# Patient Record
Sex: Male | Born: 1955
Health system: Southern US, Community
[De-identification: ages and names within clinical notes are randomized; demographics above are authoritative.]

## PROBLEM LIST (undated history)

## (undated) DIAGNOSIS — R7301 Impaired fasting glucose: Secondary | ICD-10-CM

## (undated) DIAGNOSIS — I2510 Atherosclerotic heart disease of native coronary artery without angina pectoris: Secondary | ICD-10-CM

## (undated) DIAGNOSIS — M545 Low back pain, unspecified: Secondary | ICD-10-CM

## (undated) DIAGNOSIS — E119 Type 2 diabetes mellitus without complications: Secondary | ICD-10-CM

## (undated) DIAGNOSIS — I82409 Acute embolism and thrombosis of unspecified deep veins of unspecified lower extremity: Secondary | ICD-10-CM

## (undated) DIAGNOSIS — G629 Polyneuropathy, unspecified: Secondary | ICD-10-CM

## (undated) DIAGNOSIS — I739 Peripheral vascular disease, unspecified: Secondary | ICD-10-CM

## (undated) DIAGNOSIS — R480 Dyslexia and alexia: Secondary | ICD-10-CM

## (undated) DIAGNOSIS — K649 Unspecified hemorrhoids: Secondary | ICD-10-CM

## (undated) DIAGNOSIS — M87052 Idiopathic aseptic necrosis of left femur: Secondary | ICD-10-CM

## (undated) DIAGNOSIS — S069X9A Unspecified intracranial injury with loss of consciousness of unspecified duration, initial encounter: Secondary | ICD-10-CM

## (undated) DIAGNOSIS — I219 Acute myocardial infarction, unspecified: Secondary | ICD-10-CM

## (undated) DIAGNOSIS — J189 Pneumonia, unspecified organism: Secondary | ICD-10-CM

## (undated) DIAGNOSIS — T8859XA Other complications of anesthesia, initial encounter: Secondary | ICD-10-CM

## (undated) DIAGNOSIS — F332 Major depressive disorder, recurrent severe without psychotic features: Secondary | ICD-10-CM

## (undated) DIAGNOSIS — I701 Atherosclerosis of renal artery: Secondary | ICD-10-CM

## (undated) DIAGNOSIS — IMO0001 Reserved for inherently not codable concepts without codable children: Secondary | ICD-10-CM

## (undated) DIAGNOSIS — I639 Cerebral infarction, unspecified: Secondary | ICD-10-CM

## (undated) DIAGNOSIS — I509 Heart failure, unspecified: Secondary | ICD-10-CM

## (undated) DIAGNOSIS — E7800 Pure hypercholesterolemia, unspecified: Secondary | ICD-10-CM

## (undated) DIAGNOSIS — I10 Essential (primary) hypertension: Secondary | ICD-10-CM

## (undated) DIAGNOSIS — Z8719 Personal history of other diseases of the digestive system: Secondary | ICD-10-CM

## (undated) DIAGNOSIS — T4145XA Adverse effect of unspecified anesthetic, initial encounter: Secondary | ICD-10-CM

## (undated) DIAGNOSIS — F1011 Alcohol abuse, in remission: Secondary | ICD-10-CM

## (undated) DIAGNOSIS — M1612 Unilateral primary osteoarthritis, left hip: Secondary | ICD-10-CM

## (undated) DIAGNOSIS — Z720 Tobacco use: Secondary | ICD-10-CM

## (undated) DIAGNOSIS — G8929 Other chronic pain: Secondary | ICD-10-CM

## (undated) DIAGNOSIS — K5730 Diverticulosis of large intestine without perforation or abscess without bleeding: Secondary | ICD-10-CM

## (undated) HISTORY — DX: Diverticulosis of large intestine without perforation or abscess without bleeding: K57.30

## (undated) HISTORY — DX: Polyneuropathy, unspecified: G62.9

## (undated) HISTORY — DX: Type 2 diabetes mellitus without complications: E11.9

## (undated) HISTORY — DX: Acute myocardial infarction, unspecified: I21.9

## (undated) HISTORY — DX: Tobacco use: Z72.0

## (undated) HISTORY — DX: Idiopathic aseptic necrosis of left femur: M87.052

## (undated) HISTORY — PX: CARDIAC CATHETERIZATION: SHX172

## (undated) HISTORY — DX: Alcohol abuse, in remission: F10.11

## (undated) HISTORY — DX: Low back pain, unspecified: M54.50

## (undated) HISTORY — DX: Low back pain: M54.5

## (undated) HISTORY — DX: Atherosclerosis of renal artery: I70.1

## (undated) HISTORY — DX: Heart failure, unspecified: I50.9

## (undated) HISTORY — DX: Impaired fasting glucose: R73.01

## (undated) HISTORY — DX: Other chronic pain: G89.29

## (undated) HISTORY — PX: PERIPHERAL ARTERIAL STENT GRAFT: SHX2220

## (undated) HISTORY — DX: Major depressive disorder, recurrent severe without psychotic features: F33.2

## (undated) HISTORY — DX: Acute embolism and thrombosis of unspecified deep veins of unspecified lower extremity: I82.409

## (undated) HISTORY — PX: COLONOSCOPY W/ POLYPECTOMY: SHX1380

## (undated) HISTORY — DX: Unilateral primary osteoarthritis, left hip: M16.12

---

## 2007-12-22 DIAGNOSIS — I639 Cerebral infarction, unspecified: Secondary | ICD-10-CM

## 2007-12-22 HISTORY — DX: Cerebral infarction, unspecified: I63.9

## 2007-12-22 HISTORY — PX: CORONARY ARTERY BYPASS GRAFT: SHX141

## 2008-07-19 ENCOUNTER — Ambulatory Visit: Payer: Self-pay | Admitting: Surgery

## 2008-07-19 ENCOUNTER — Encounter: Payer: Self-pay | Admitting: Surgery

## 2008-07-19 ENCOUNTER — Inpatient Hospital Stay (HOSPITAL_COMMUNITY): Admission: EM | Admit: 2008-07-19 | Discharge: 2008-07-26 | Payer: Self-pay | Admitting: Emergency Medicine

## 2008-08-10 ENCOUNTER — Ambulatory Visit (HOSPITAL_COMMUNITY): Admission: RE | Admit: 2008-08-10 | Discharge: 2008-08-10 | Payer: Self-pay | Admitting: Cardiovascular Disease

## 2008-08-14 ENCOUNTER — Encounter: Admission: RE | Admit: 2008-08-14 | Discharge: 2008-08-14 | Payer: Self-pay | Admitting: Surgery

## 2008-08-14 ENCOUNTER — Ambulatory Visit: Payer: Self-pay | Admitting: Surgery

## 2008-08-22 ENCOUNTER — Emergency Department: Payer: Self-pay | Admitting: Emergency Medicine

## 2008-12-31 ENCOUNTER — Ambulatory Visit: Payer: Self-pay | Admitting: Gastroenterology

## 2011-02-17 HISTORY — PX: OTHER SURGICAL HISTORY: SHX169

## 2011-02-19 ENCOUNTER — Ambulatory Visit: Payer: Self-pay | Admitting: Vascular Surgery

## 2011-04-20 ENCOUNTER — Ambulatory Visit: Payer: Self-pay | Admitting: Vascular Surgery
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
## 2011-05-05 NOTE — Assessment & Plan Note (Signed)
OFFICE VISIT   Lucas Weiss, Lucas Weiss  DOB:  Feb 15, 1956                                        August 14, 2008  CHART #:  [id number]   The patient returns today for followup status post coronary artery  bypass graft surgery x5 on July 20, 2008.  His postoperative course was  complicated by a left eye lower visual field cut, which he noticed after  awakening from the surgery.  This persisted without change.  He did have  a CT scan of the head, which was negative for any sign of stroke.  Since  discharge, he has followed up with his ophthalmologist, Dr. Oren Bracket.  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  It was unclear whether  this was due to occlusion of the vessel or embolic disease, but no other  embolic disease was seen.  He underwent a repeat CT scan of the head  last week, which showed no acute abnormality and no sign of stroke.  He  says that his visual field deficit is unchanged.  He remains somewhat  sore overall.  He does have chronic arthritis and has been on disability  on pain medication at home for that.  He denies any chest pain or  shortness of breath.   On physical examination, his blood pressure is 110/64 and his pulse is  63 and regular.  Respiratory rate is 18, unlabored.  Oxygen saturation  on room air is 100%.  He looks well.  Cardiac exam shows regular rate  and rhythm with normal heart sounds.  His lung exam is clear.  Chest  incision is healing well, and the sternum is stable.  His leg incision  is healing well, and there is no peripheral edema.   Followup chest x-ray today shows small left pleural effusion and minimal  residual atelectasis.   His medications are Crestor 20 mg nightly, lisinopril 10 mg daily,  aspirin 325 mg daily, and hydrocodone 7.5/500 p.r.n. for pain.   IMPRESSION:  Overall, the patient is making slow but satisfactory  recovery following his  heart surgery.  He has suffered a left visual  field deficit, which I suspect is probably due to perioperative embolic  phenomena.  He does have severe diffuse vascular disease.  He said that  Dr. Alanda Amass has scheduled an appointment for him to see a neurologist.  The ophthalmologist felt that his vision may improve some when the edema  resolves, but he will likely have a persistent left eye deficit.  I told  him he could return to driving a car when he feels comfortable with  that.  I asked him not to lift anything heavier than 10 pounds for a  total of 3 months from date of surgery.  He will continue to follow up  with his primary physician and Dr. Alanda Amass and will contact me if he  develops any problems with his incisions.   Evelene Croon, M.D.  Electronically Signed   BB/MEDQ  D:  08/14/2008  T:  08/15/2008  Job:  [id number]   cc:   Gerlene Burdock A. Alanda Amass, M.D.

## 2011-05-05 NOTE — Op Note (Signed)
Lucas Weiss, Lucas Weiss                 ACCOUNT NO.:  [id number]   MEDICAL RECORD NO.:  [id number]          PATIENT TYPE:  INP   LOCATION:  2306                         FACILITY:  [facility]   PHYSICIAN:  Evelene Croon, M.D.     DATE OF BIRTH:  Nov 30, 1956   DATE OF PROCEDURE:  07/20/2008  DATE OF DISCHARGE:                               OPERATIVE REPORT   PREOPERATIVE DIAGNOSIS:  High-grade left main and severe three-vessel  coronary disease with unstable angina.   POSTOPERATIVE DIAGNOSIS:  High-grade left main and severe three-vessel  coronary disease with unstable angina.   OPERATIVE PROCEDURE:  Median sternotomy, extracorporeal circulation,  coronary artery bypass graft surgery x5 using a left internal mammary  artery graft to the left anterior descending coronary artery, with a  saphenous vein graft to diagonal branch of the left anterior descending  artery, a sequential saphenous vein graft to the second and third obtuse  marginal branches of the left circumflex coronary artery, and a  saphenous vein graft to the posterior descending branch of the right  coronary artery.  Endoscopic vein harvesting from the right leg.   ATTENDING SURGEON:  Evelene Croon, MD   ASSISTANT:  Doree Fudge, [facility]   ANESTHESIA:  General endotracheal.   CLINICAL HISTORY:  This patient is a 55 year old gentleman with history  of hyperlipidemia and smoking who was admitted with unstable anginal  symptoms.  Cardiac catheterization showed about 85% left main stenosis  as well as severe diffuse three-vessel coronary artery disease with well-  preserved left ventricular function.  After review of the angiogram and  examination, the patient was felt that coronary bypass graft surgery was  the best treatment to prevent further ischemia and infarction.  I  discussed the operative procedure with the patient and his family  including alternatives, benefits, and risks including but not limited to  bleeding, blood  transfusion, infection, stroke, myocardial infarction,  graft failure, and death.  He understood and agreed to proceed.   OPERATIVE PROCEDURE:  The patient was taken to the operating room and  placed on table in supine position.  After induction of general  endotracheal anesthesia, a Foley catheter was placed in the bladder  using sterile technique.  Then the chest, abdomen, and both lower  extremities were prepped and draped in usual sterile manner.  The chest  entered through a median sternotomy incision.  The pericardium opened in  the midline.  Examination of the heart showed good ventricular  contractility.  The ascending aorta had no palpable plaques in it.   Then the left internal mammary artery was harvested from the chest wall  as a pedicle graft.  This was a medium caliber vessel with excellent  blood flow through.  At the same time, a segment of greater saphenous  vein was harvested from the right leg using endoscopic vein harvest  technique.  This vein was a medium size and good quality.   Then the patient was heparinized and when an adequate activated clotting  time was achieved, the distal ascending aorta was cannulated using a 20-  [place] aortic  cannula for arterial inflow.  Venous outflow was achieved  using a 2-stage venous cannula through the right atrial appendage.  An  antegrade cardioplegia and vent cannula was inserted into the aortic  root.   The patient was placed on cardiopulmonary bypass and distal coronaries  were identified.  The LAD was a large graftable vessel that was heavily  diseased proximally and distally.  The midportion of the vessel had some  segmental areas of plaque, but overall was a large graftable vessel.  The diagonal branch was a medium-sized graftable vessel.  The second and  third marginal branches were both intramyocardial.  There were located  just as they entered the muscle.  The second marginal was a large vessel  and the third  marginal was a medium-sized vessel.  The right coronary  artery was diffusely diseased and this extended down to the takeoff of  the posterior descending branch.  The posterior descending and  posterolateral branches were all small vessels.  The posterior  descending was of borderline size for grafting but given the severe  degree of disease in the main body of the right coronary artery, I felt  it would be necessary to bypass into the posterior descending branch.   Then the aorta was crossclamped and 500 mL of cold blood antegrade  cardioplegia was administered in the aortic root with quick arrest of  the heart.  Systemic hypothermia to 20 degrees centigrade and topical  hypothermia with iced saline was used.  A temperature probe placed in  the septum and insulating pad in the pericardium.   The first distal anastomosis was performed to the posterior descending  coronary artery.  The internal diameter of this vessel was about 1.5 mm.  The conduit used was a segment of greater saphenous vein and the  anastomosis was performed in an end-to-side manner using continuous 7-0  Prolene suture.  Flow was noted through the graft and was excellent.   Then the second distal anastomosis was performed to the second marginal  branch.  The internal diameter was about 2 mm.  Conduit used was a  second segment of greater saphenous vein and the anastomosis was  performed in a sequential side-to-side manner using continuous 7-0  Prolene suture.  Flow was noted through the graft and was excellent.   The third distal anastomosis was performed to the third marginal branch.  The internal diameter was about 1.75 mm.  The conduit used was the same  segment of greater saphenous vein and the anastomosis was performed in a  sequential end-to-side manner using continuous 7-0 Prolene suture.  Flow  was noted through the graft and was excellent.  Then another dose of  cardioplegia was given down the vein grafts and  in the aortic root.   The fourth distal anastomosis was performed to the diagonal branch.  The  internal diameter was about 1.5 mm.  The conduit used was a third  segment of greater saphenous vein and the anastomosis was performed in  an end-to-side manner using continuous 7-0 Prolene suture.  Flow was  noted through the graft and was excellent.   The fifth distal anastomosis was performed to the midportion of the left  anterior descending coronary artery.  The internal diameter of this  vessel was about 2 mm.  The conduit used was a left internal mammary  graft and this was brought through an opening in the left pericardium  anterior to the phrenic nerve.  It was then anastomosed  to the LAD in an  end-to-side manner using continuous 8-0 Prolene suture.  The pedicle was  sutured to the epicardium with 6-0 Prolene sutures.  The patient was  rewarmed to 37 degrees centigrade.  With the crossclamp in place, the  three proximal vein graft anastomoses were performed to the aortic root  in end-to-side manner using continuous 6-0 Prolene suture.  Then the  clamp was removed from the mammary pedicle.  There was rapid warming of  the ventricular septum and return of spontaneous ventricular  fibrillation.  The crossclamp was removed with time of 93 minutes and  the patient was spontaneously converted to sinus rhythm.   The proximal and distal anastomoses appeared hemostatic and all the  grafts satisfactory.  Graft markers were placed around the proximal  anastomoses.  Two temporary right ventricular and right atrial pacing  wires were placed and brought through the skin.   When the patient was rewarmed to 37 degrees centigrade, he was weaned  from cardiopulmonary bypass on no inotropic agents.  Total bypass time  was 113 minutes.  Cardiac function appeared excellent with a cardiac  output of 5 L/min.  Protamine was given and the venous and aortic  cannulas were removed without difficulty.   Hemostasis was achieved.  Three chest tubes were placed with two in the posterior pericardium, one  in left pleural space and one in the anterior mediastinum.  The  pericardium was loosely reapproximated over the heart.  The sternum was  closed with #6 stainless steel wire.  Fascia was closed with continuous  #1 Vicryl suture.  Subcutaneous tissues was closed with continuous 2-0  Vicryl and the skin with 3-0 Vicryl subcuticular closure.  The lower  extremity vein harvest site was closed in layers in similar manner.  The  sponge, needle, and instrument counts were correct according to the  scrub nurse.  Dry sterile dressing was applied over the incisions around  the chest tubes which were Pleur-Evac suction.  The patient remained  hemodynamically stable and was transferred to the SICU in guarded but  stable condition.      Evelene Croon, M.D.  Electronically Signed     BB/MEDQ  D:  07/20/2008  T:  07/21/2008  Job:  [id number]   cc:   Gerlene Burdock A. Alanda Amass, M.D.

## 2011-05-08 NOTE — Discharge Summary (Signed)
Lucas Weiss, Lucas Weiss                 ACCOUNT NO.:  [id number]   MEDICAL RECORD NO.:  [id number]          PATIENT TYPE:  INP   LOCATION:  2018                         FACILITY:  [facility]   PHYSICIAN:  Evelene Croon, M.D.     DATE OF BIRTH:  December 04, 1956   DATE OF ADMISSION:  07/18/2008  DATE OF DISCHARGE:  07/26/2008                               DISCHARGE SUMMARY   ADMITTING DIAGNOSES:  1. Multivessel coronary artery disease.  2. History of hypertension.  3. Newly diagnosed dyslipidemia.  4. History of chronic back pain.   DISCHARGE DIAGNOSES:  1. Multivessel coronary artery disease.  2. History of hypertension.  3. Newly diagnosed dyslipidemia.  4. Left eye lower visual field cut.  5. History of chronic back pain.   PROCEDURE:  1. Cardiac catheterization performed by Dr. Alanda Amass on July 19, 2008.  2. Coronary artery bypass grafting surgery x5 (left internal mammary      artery to left anterior descending, saphenous vein graft to      diagonal branch of the left anterior descending, saphenous vein      graft to second and third obtuse marginal branches of the left      circumflex coronary artery, saphenous vein graft to the posterior      descending (coronary) artery with endoscopic vein harvest in the      right lower extremity by Dr. Laneta Simmers on July 20, 2008.   HISTORY OF PRESENTING ILLNESS:  This is a 55 year old male with a  history of hypertension, tobacco use and newly diagnosed hyperlipidemia  who presented to [facility] with a several-month history of feeling  poorly and episodes of substernal chest discomfort and shortness of  breath.  According to medical records, the patient was seen by a  cardiologist at [facility] and had a stress Myoview test,  however, these results were not available.  He declined a cardiac  catheterization at [facility] and was scheduled to have a cardiac  catheterization at [facility] on August 10, 2008, however, because  he continued to have episodes of chest pain and shortness of breath with  minimal exertion, he presented to [facility] Emergency Room on July 19, 2008.  A cardiac catheterization was performed by Dr. Alanda Amass which  showed an 80-85% left main stenosis, 40-50% stenosis of the LAD,  marginal #1 60% lesion, mid  lesion of 80% in the circ, the RCA had a 60-  70% proximal and distal stenosis with an EF of about 55%.  It should  also be noted that the patient was found to have 50% right renal artery  stenosis, as well as a 30% left renal artery stenosis and a 60% left  common iliac artery stenosis.  The patient was placed on a heparin drip.  This was discontinued upon going to the operating room.  He then  underwent the aforementioned coronary artery bypass grafting surgery.   BRIEF HOSPITAL COURSE STAY:  The patient was extubated later in the  afternoon the day of surgery.  Chest tubes were removed on postoperative  day #1.  Followup x-ray revealed no pneumothorax, low lung volumes with  bibasilar atelectasis, however.  The patient did have complaints of back  pain (history of chronic back pain) as well as blurry vision and lower  visual field cut to the left eye).  There were no other focal deficits  noted in his neurological exam.  The decreased lower visual field cut of  his left eye remained the same throughout his postoperative course stay.  He did have a CT of the head done on July 23, 2008, which showed no  acute intracranial abnormality.  He remained afebrile and  hemodynamically stable.  He was volume overloaded and diuresed  accordingly.  He was progressing well with cardiac rehab.  The patient  did have some complaints of occasional nauseousness and dizziness most  likely related to his left eye.  The patient continued to improve such  that by postoperative day #6, he had a T-max of 99.2 but later became  afebrile.  His vital signs were stable.  He did notice some bright red   blood after having had a bowel movement.  The patient states that he  does have a history of hemorrhoids and this has happened on several  occasions in the past.  His preop weight was 73 kg.  His weight today  was down to 69.6 kg.   PHYSICAL EXAMINATION:  CARDIOVASCULAR:  Regular rate and rhythm.  PULMONARY:  Diminished at the bases bilaterally.  ABDOMEN:  Soft, nontender, bowel sounds present.  EXTREMITIES:  Trace edema right greater than left, the sternal and right  lower extremity wounds clean and dry.  NEURO:  Grossly intact, no focal deficits except for the lower visual  field of the left eye.  (As previously noted, the patient was discharged  on this date.  Prior to his discharge, epicardial pacing wires and chest  tube sutures were removed).   LATEST LABORATORY STUDIES:  B-MET done July 24, 2008, showed the sodium  to be 134, potassium to be 3.16 (potassium was supplemented  accordingly), BUN and creatinine 11 and 0.81 respectively.  Last CBC was  also done on July 23, 2008, which showed the H&H to be 10.8 and 31.7,  white count of 10,400 and platelet count of 154,000.  Last chest x-ray  was done on July 25, 2008, showed persistent small bilateral pleural  effusions and mild bibasilar atelectasis, edema diminished.   DISCHARGE INSTRUCTIONS:  The patient is to remain on a low-fat, low-salt  diet.  He is not to drive or lift more than 10 pounds until instructed  otherwise.  He is to walk daily and continue breathing exercises daily.  He may shower and cleanse the wounds with mild soap and water.  He is to  contact the office if any wound problems arise.  He was also educated on  the importance to stop smoking.  He was encouraged to enroll in smoking  cessation classes and he also requested information on a NicoDerm patch.   DISCHARGE IMPORTANCE:  1. The patient needed to call for an appointment to see an      ophthalmologist regarding decreased vision in the left eye.  2.  Appointment to see Dr. Alanda Amass on September 07, 2008, at 2:30.  3. He has an appointment to see Dr. Laneta Simmers on September 14, 2008.      Prior to this appointment, a chest x-ray will be obtained.   DISCHARGE MEDICATIONS:  1. Lopressor 25 mg p.o. two times  daily.  2. ECASA 325 mg p.o. daily.  3. Crestor 20 mg p.o. at bedtime.  4. Lisinopril 10 mg p.o. daily.  5. Lasix 40 mg p.o. daily x1 week.  6. KCl 20 mEq p.o. daily x1 week.  7. Oxycodone 5 mg 1-2 tablets every 4-6 hours as needed for pain.      Doree Fudge, [place]      Evelene Croon, M.D.  Electronically Signed    DZ/MEDQ  D:  08/30/2008  T:  08/31/2008  Job:  [id number]   cc:   Gerlene Burdock A. Alanda Amass, M.D.

## 2011-07-16 ENCOUNTER — Observation Stay (HOSPITAL_COMMUNITY)
Admission: EM | Admit: 2011-07-16 | Discharge: 2011-07-19 | DRG: 243 | Disposition: A | Discharge status: Home / Self Care | Payer: BC Managed Care – PPO | Source: Ambulatory Visit | Attending: Cardiovascular Disease | Admitting: Cardiovascular Disease

## 2011-07-16 ENCOUNTER — Emergency Department (HOSPITAL_COMMUNITY): Payer: BC Managed Care – PPO

## 2011-07-16 DIAGNOSIS — I2510 Atherosclerotic heart disease of native coronary artery without angina pectoris: Secondary | ICD-10-CM | POA: Insufficient documentation

## 2011-07-16 DIAGNOSIS — I10 Essential (primary) hypertension: Secondary | ICD-10-CM | POA: Insufficient documentation

## 2011-07-16 DIAGNOSIS — R300 Dysuria: Secondary | ICD-10-CM | POA: Insufficient documentation

## 2011-07-16 DIAGNOSIS — Z951 Presence of aortocoronary bypass graft: Secondary | ICD-10-CM | POA: Insufficient documentation

## 2011-07-16 DIAGNOSIS — M47812 Spondylosis without myelopathy or radiculopathy, cervical region: Secondary | ICD-10-CM | POA: Insufficient documentation

## 2011-07-16 DIAGNOSIS — M79609 Pain in unspecified limb: Secondary | ICD-10-CM | POA: Insufficient documentation

## 2011-07-16 DIAGNOSIS — R079 Chest pain, unspecified: Principal | ICD-10-CM | POA: Insufficient documentation

## 2011-07-16 DIAGNOSIS — R110 Nausea: Secondary | ICD-10-CM | POA: Insufficient documentation

## 2011-07-16 DIAGNOSIS — M549 Dorsalgia, unspecified: Secondary | ICD-10-CM | POA: Insufficient documentation

## 2011-07-16 DIAGNOSIS — E785 Hyperlipidemia, unspecified: Secondary | ICD-10-CM | POA: Insufficient documentation

## 2011-07-16 LAB — PRO B NATRIURETIC PEPTIDE: Pro B Natriuretic peptide (BNP): 54.7 pg/mL (ref 0–125)

## 2011-07-16 LAB — COMPREHENSIVE METABOLIC PANEL
ALT: 29 U/L (ref 0–53)
AST: 19 U/L (ref 0–37)
CO2: 22 mEq/L (ref 19–32)
Calcium: 9.4 mg/dL (ref 8.4–10.5)
GFR calc non Af Amer: >60 mL/min (ref >60)
Potassium: 4 mEq/L (ref 3.5–5.1)
Sodium: 136 mEq/L (ref 135–145)
Total Protein: 7.5 g/dL (ref 6.0–8.3)

## 2011-07-16 LAB — CBC
MCH: 28.7 pg (ref 26.0–34.0)
MCHC: 34.1 g/dL (ref 30.0–36.0)
MCV: 84.2 fL (ref 78.0–100.0)
Platelets: 202 10*3/uL (ref 150–400)
RBC: 4.87 MIL/uL (ref 4.22–5.81)

## 2011-07-16 LAB — URINALYSIS, ROUTINE W REFLEX MICROSCOPIC
Hgb urine dipstick: NEGATIVE
Specific Gravity, Urine: 1.026 (ref 1.005–1.030)
pH: 6 (ref 5.0–8.0)

## 2011-07-16 LAB — DIFFERENTIAL
Eosinophils Absolute: 0.1 10*3/uL (ref 0.0–0.7)
Eosinophils Relative: 1 % (ref 0–5)
Lymphs Abs: 1.5 10*3/uL (ref 0.7–4.0)
Monocytes Absolute: 0.6 10*3/uL (ref 0.1–1.0)
Monocytes Relative: 7 % (ref 3–12)
Neutrophils Relative %: 75 % (ref 43–77)

## 2011-07-16 LAB — CARDIAC PANEL(CRET KIN+CKTOT+MB+TROPI): Total CK: 36 U/L (ref 7–232)

## 2011-07-16 LAB — CK TOTAL AND CKMB (NOT AT ARMC): CK, MB: 1.5 ng/mL (ref 0.3–4.0)

## 2011-07-16 LAB — URINE MICROSCOPIC-ADD ON

## 2011-07-16 LAB — APTT: aPTT: 30 seconds (ref 24–37)

## 2011-07-17 ENCOUNTER — Observation Stay (HOSPITAL_COMMUNITY): Payer: BC Managed Care – PPO

## 2011-07-17 LAB — LIPID PANEL
HDL: 34 mg/dL — ABNORMAL LOW (ref >39)
Total CHOL/HDL Ratio: 6.3 RATIO
Triglycerides: 240 mg/dL — ABNORMAL HIGH (ref <150)

## 2011-07-17 LAB — HEMOGLOBIN A1C
Hgb A1c MFr Bld: 6.2 % — ABNORMAL HIGH (ref <5.7)
Mean Plasma Glucose: 131 mg/dL — ABNORMAL HIGH (ref <117)

## 2011-07-17 LAB — CARDIAC PANEL(CRET KIN+CKTOT+MB+TROPI)
Relative Index: INVALID (ref 0.0–2.5)
Troponin I: <0.3 ng/mL (ref <0.30)

## 2011-07-17 MED ORDER — TECHNETIUM TC 99M TETROFOSMIN IV KIT
30.0000 | PACK | Freq: Once | INTRAVENOUS | Status: AC | PRN
  Administered 2011-07-17: 30 via INTRAVENOUS

## 2011-07-17 MED ORDER — TECHNETIUM TC 99M TETROFOSMIN IV KIT
10.0000 | PACK | Freq: Once | INTRAVENOUS | Status: AC | PRN
  Administered 2011-07-17: 10 via INTRAVENOUS

## 2011-07-18 ENCOUNTER — Other Ambulatory Visit (HOSPITAL_COMMUNITY): Payer: Medicare Other

## 2011-07-18 ENCOUNTER — Observation Stay (HOSPITAL_COMMUNITY): Payer: BC Managed Care – PPO

## 2011-07-21 NOTE — Discharge Summary (Signed)
NAMEBETTY, BROOKS NO.:  [id number]  MEDICAL RECORD NO.:  [id number]  LOCATION:  3712                         FACILITY:  [facility]  PHYSICIAN:  [place] Sema Stangler, MD         DATE OF BIRTH:  21-Jul-1956  DATE OF ADMISSION:  07/16/2011 DATE OF DISCHARGE:  07/19/2011                              DISCHARGE SUMMARY   DISCHARGE DIAGNOSES: 1. Left scapular pain/left arm pain. 2. Spondylosis at C6 and C7 and mild spondylosis at C5-6. 3. Nausea. 4. Dyslipidemia treated.  HOSPITAL COURSE:  Mr. Odowd is a 55 year old male who had coronary bypass grafting x5 on July 20, 2008.  He had a LIMA to the LAD and the vein grafts, diagonal vein graft to OM2 and 3 and vein graft to the PDA.  His postbypass course was complicated by stroke.  He apparently had partial visual loss.  History also includes dyslipidemia and hypertension as well as peripheral vascular disease.  He had bilateral iliac and bilateral SFA stenting 4 months ago.  Recent 2-D echocardiogram showed ejection fraction of 55%.  This was in the past spring.  He presented to the emergency room with complaints of 3 days of left scapular and left arm pain.  He also states this occasionally radiated to his left anterior chest, left side of his neck.  He was admitted to rule out for myocardial infarction.  Scheduled for a Lexiscan Myoview.  Cardiac enzymes were cycled and were negative.  Lexiscan revealed a slightly reduced inferior wall activity on stress and rest images favoring diaphragmatic attenuation of a scar.  Otherwise there were no significant abnormalities identified.  The patient's nausea was treated with Zofran and his pain was treated with morphine and Percocet.  This seemed to improve his symptoms.  MRI of the C-spine and thoracic spine were ordered.  It showed spondylosis at C6 and C7, slightly more pronounced on the right morphologically.  There was narrowing of the canal but no cor deformity.  Neuroforaminal  stenosis bilaterally that could affect either or both C7 nerve roots.  Also noted mild spondylosis at C5 and 6 with only mild foraminal narrowing.  The patient continues to have some mild left arm pain and left scapular pain, which improved with Percocet.  Has been seen by Dr. Gearldine Bienenstock who feels he is stable for discharge home with followup with orthopedic spine MD.  DISCHARGE LABS:  WBC 9.3, hemoglobin 14.0, hematocrit 41.0, platelets 202,000.  Sodium 136, potassium 4.0, chloride 102, carbon dioxide 22, glucose 170, BUN 19, creatinine 1.23, total bilirubin 0.3, alkaline phosphatase 88, AST 19, ALT 29, total protein 7.5, albumin 4.0, and calcium 9.4, hemoglobin A1c is 6.2.  Cardiac enzymes negative x3.  BNP of 54.7, total cholesterol 215, triglycerides of 240, HDL 34, LDL 133, VLDL 48, total cholesterol HDL ratio 6.3.  Thyroid stimulating hormone 0.672.  Urinalysis showed small bilirubin, protein 30, few bacteria, granular casts, and hyaline casts.  STUDIES/PROCEDURES: 1. Chest x-ray July 26 was stable chest x-ray with no evidence of     acute cardiopulmonary process. 2. Lexiscan Myoview stress test.  IMPRESSION: 1. Slightly to reduced inferior wall activity on stress and rest  images favored diaphragmatic attenuation over scar.  Otherwise no     significant abnormality identified. 2. MRI of cervical spine and thoracic spine showed spondylosis at C6     and 7, slightly more pronounced than the right morphologically.     Narrowing of the canal but no cor deformity.  Neuroforaminal     stenosis bilaterally could affect either or both C7 nerve roots.     Mild spondylosis at C5 and 6 with only mild formal narrowing.     There was negative examination of thoracic region.  No degenerative     or other pathology noted.  DISCHARGE MEDICATIONS: 1. Acetaminophen 325 mg 2 tablets by mouth every 4 hours as needed for     pain. 2. Colace 100 mg 1 tab by mouth twice daily. 3. Ibuprofen 400  mg 1 tablet by mouth 3 times a day with meals. 4. Maalox suspension 15-30 mL by mouth every 2 hours as needed. 5. Nitroglycerin sublingual 0.4 mg 1 tablet under tongue every 5     minutes up to 3 doses total for chest pain. 6. Amlodipine 5 mg 1 tablet by mouth daily. 7. Aspirin enteric-coated 81 mg 1 tablet by mouth daily. 8. Metoprolol tartrate 25 mg 1 tablet by mouth twice daily. 9. Oxycodone/acetaminophen 7.5/325 mg 1-2 tablets by mouth every 6     hours as needed for pain. 10.Plavix 75 mg 1 tablet by mouth daily. 11.Zocor 40 mg 1 tablet by mouth daily at bedtime.  DISPOSITION:  Mr. Bruni will be discharged home in stable condition.  He is recommended to eat a heart-healthy, low-carbohydrate diet.  Recommend he follows up with orthopedic physician and he is instructed to call for an appointment and he is to follow up with Dr. Alanda Amass as needed at [facility].    ______________________________ Wilburt Finlay, PA   ______________________________ [place] Ellanie Oppedisano, MD    BH/MEDQ  D:  07/19/2011  T:  07/19/2011  Job:  [id number]  cc:   Gerlene Burdock A. Alanda Amass, M.D.  Electronically Signed by Wilburt Finlay PA on 07/20/2011 02:19:59 PM Electronically Signed by Kirtland Bouchard. Mart Colpitts M.D. on 07/21/2011 01:32:06 PM

## 2011-08-07 NOTE — H&P (Signed)
NAMEJEBIDIAH, Lucas Weiss NO.:  [id number]  MEDICAL RECORD NO.:  [id number]  LOCATION:  3712                         FACILITY:  [facility]  PHYSICIAN:  Nanetta Batty, M.D.   DATE OF BIRTH:  1956-03-25  DATE OF ADMISSION:  07/16/2011 DATE OF DISCHARGE:                             HISTORY & PHYSICAL   CHIEF COMPLAINTS:  Left subscapular pain and left arm pain.  HISTORY OF PRESENT ILLNESS:  Lucas Weiss is a 55 year old male who had seen by Dr. Alanda Amass in the past.  He had bypass grafting x5 July 20, 2008. He had an LIMA to the LAD and a vein graft to the diagonal, vein graft to the OM2 and OM3, and a vein graft to the PDA.  His post bypass course was complicated by a left eye stroke.  He apparently had partial visual loss.  This was confirmed by an ophthalmologist.  CT scan was negative. The patient says since his bypass, he has been "disabled."  It is interesting to note that previous records indicate that he was disabled even before his bypass for some chronic back pain.  The patient denies this.  The patient saw Dr. Alanda Amass back once and had had some atypical chest pain.  A Myoview in 2010 was negative for ischemia.  He did see Dr. Alanda Amass in February after he ran out of medications.  Apparently in spring of this year, he saw Dr. Wyn Quaker in [facility] for some vascular problems.  According to the patient, he had bilateral iliac and bilateral SFA stenting 4 months ago.  He says that a couple weeks later he had to go back for redo procedure but since then has done fine. The patient is a poor historian, he says he has short-term memory loss since his bypass.  Dr. Alanda Amass did do an echocardiogram this spring which showed his ejection fraction to be 55%.  The patient presents now to the emergency room with complaints of 3 days of left subscapular and left arm pain.  Occasionally this radiates to the left anterior chest and to left side of his neck. His symptoms have been  constant and seem to be worse with movement.  We were asked to evaluate him from a cardiac standpoint by the emergency room physician.  His troponins and EKG are normal.  He denies any substernal chest pain.  Reviewing his records, it appears that his symptoms pre bypass were back pain and shortness of breath.  The patient does not remember what symptoms he had.  He is seen by Dr. Allyson Sabal and myself in the emergency room and he is admitted for further evaluation and to rule out MI.  PAST MEDICAL HISTORY:  Remarkable for treated dyslipidemia and treated hypertension.  He has had some back problems in the past but no surgeries.  HOME MEDICATIONS: 1. Plavix 75 mg a day. 2. Zocor 40 mg a day. 3. Metoprolol 25 mg b.i.d. 4. Amlodipine 5 mg a day. 5. Aspirin 81 mg a day.  ALLERGIES:  He has no known drug allergies.  SOCIAL HISTORY:  He quit smoking after his bypass.  He has been married 36 years but is undergoing separation now  from his wife.  He has 2 children and 2 grandchildren.  He is currently on disability.  He used to a [employer] development [employer].  FAMILY HISTORY:  Remarkable for coronary disease.  REVIEW OF SYSTEMS:  Essentially unremarkable except for noted above, he denies any history of diabetes.  He has not had GI bleeding or kidney disease.  PHYSICAL EXAMINATION:  VITAL SIGNS:  Blood pressure 115/67, pulse 70, respirations 12. GENERAL:  He is a well-developed, well-nourished male in no acute distress. HEENT:  Normocephalic, atraumatic.  Extraocular movements are intact. Sclerae nonicteric.  Lids and conjunctivae within normal limits. NECK:  Without JVD or bruit. CHEST:  Clear to auscultation and percussion. CARDIAC:  Reveals regular rate and rhythm without murmur, rub, or gallop.  Normal S1, S2. ABDOMEN:  Nontender, nondistended, he does have a soft right quadrant bruit. EXTREMITIES:  Without edema.  He has a faint left popliteal pulse, but his other pulses in the  lower extremities are diminished.  He has bifemoral bruits. NEURO:  Grossly intact.  He is awake, alert, oriented, and cooperative. Moves all extremities without obvious deficit. SKIN:  Cool and dry.  LABORATORY DATA:  White count 9.3, hemoglobin 14, hematocrit 41.0, platelets 202,000.  Sodium 136, potassium 4.0, BUN 19, creatinine 1.23, troponins were negative.  LFTs were normal.  INR 0.96.  Chest x-ray shows no active disease.  EKG shows sinus rhythm without acute changes.  IMPRESSION: 1. Left subscapular pain and left arm pain and left anterior chest     pain, rule out cardiac. 2. Coronary artery bypass grafting x5 July 2009 with low-risk Myoview     in 2010. 3. Good left ventricular function by echocardiogram 2012. 4. Treated hypertension. 5. Treated dyslipidemia. 6. Post bypass, left eye cerebrovascular accident, the patient says     that he is still weak on his total left side which he attributes to     a stroke after his bypass, this was not confirmed by CT scan.  PLAN:  The patient was seen by Dr. Allyson Sabal and myself.  We will go ahead and admit him overnight for rule out MI protocol.  He will be set up a Lexiscan scan Myoview in the morning if his enzymes were negative.  If his enzymes turn positive, he will need a diagnostic catheterization and to be put on full-dose Lovenox.     Lucas Weiss, P.A.   ______________________________ Nanetta Batty, M.D.   Lucas Weiss  D:  07/16/2011  T:  07/17/2011  Job:  [id number]  cc:   Gerlene Burdock A. Alanda Amass, M.D.  Electronically Signed by Corine Shelter P.A. on 07/23/2011 09:59:28 AM Electronically Signed by Nanetta Batty M.D. on 08/07/2011 03:35:27 PM

## 2011-09-18 LAB — CBC
HCT: 25.8 — ABNORMAL LOW
HCT: 43
HCT: 26.2 — ABNORMAL LOW
HCT: 29.2 — ABNORMAL LOW
HCT: 30.8 — ABNORMAL LOW
HCT: 31.7 — ABNORMAL LOW
Hemoglobin: 8.8 — ABNORMAL LOW
Hemoglobin: 8.9 — ABNORMAL LOW
Hemoglobin: 10.6 — ABNORMAL LOW
Hemoglobin: 10.8 — ABNORMAL LOW
Hemoglobin: 9.1 — ABNORMAL LOW
Hemoglobin: 9.7 — ABNORMAL LOW
Hemoglobin: 9.9 — ABNORMAL LOW
MCHC: 34.5
MCHC: 34.1
MCHC: 34.1
MCHC: 34.1
MCHC: 34.4
MCV: 87.1
MCV: 87.6
MCV: 87.7
MCV: 87.8
MCV: 87.9
MCV: 88.2
Platelets: 104 — ABNORMAL LOW
Platelets: 177
Platelets: 204
Platelets: 115 — ABNORMAL LOW
Platelets: 124 — ABNORMAL LOW
Platelets: 154
RBC: 2.95 — ABNORMAL LOW
RBC: 4.36
RBC: 3.25 — ABNORMAL LOW
RBC: 3.31 — ABNORMAL LOW
RBC: 3.5 — ABNORMAL LOW
RBC: 3.61 — ABNORMAL LOW
RDW: 13.6
RDW: 13.7
RDW: 13.2
RDW: 13.9
RDW: 14.1
RDW: 14.1
WBC: 6
WBC: 7.2
WBC: 8.4
WBC: 10.4
WBC: 7.9
WBC: 8.9

## 2011-09-18 LAB — POCT I-STAT 4, (NA,K, GLUC, HGB,HCT)
Glucose, Bld: 104 — ABNORMAL HIGH
Glucose, Bld: 118 — ABNORMAL HIGH
Glucose, Bld: 119 — ABNORMAL HIGH
HCT: 22 — ABNORMAL LOW
HCT: 25 — ABNORMAL LOW
HCT: 25 — ABNORMAL LOW
HCT: 33 — ABNORMAL LOW
Hemoglobin: 11.2 — ABNORMAL LOW
Hemoglobin: 7.5 — CL
Hemoglobin: 8.5 — ABNORMAL LOW
Potassium: 4
Potassium: 4.2
Potassium: 4.4
Sodium: 131 — ABNORMAL LOW
Sodium: 134 — ABNORMAL LOW
Sodium: 136
Sodium: 137
Sodium: 140

## 2011-09-18 LAB — HEPARIN LEVEL (UNFRACTIONATED)
Heparin Unfractionated: 0.26 — ABNORMAL LOW
Heparin Unfractionated: 0.58
Heparin Unfractionated: 0.61

## 2011-09-18 LAB — CARDIAC PANEL(CRET KIN+CKTOT+MB+TROPI)
CK, MB: 0.5
Total CK: 27

## 2011-09-18 LAB — POCT I-STAT, CHEM 8
BUN: 7
BUN: 8
Chloride: 106
Creatinine, Ser: 1
Creatinine, Ser: 0.9
Hemoglobin: 8.2 — ABNORMAL LOW
Potassium: 4.4
Potassium: 4.1
Sodium: 142
Sodium: 138

## 2011-09-18 LAB — URINALYSIS, ROUTINE W REFLEX MICROSCOPIC
Ketones, ur: NEGATIVE
Nitrite: NEGATIVE
Specific Gravity, Urine: 1.009
Urobilinogen, UA: 1
pH: 6.5

## 2011-09-18 LAB — PROTIME-INR: INR: 1.2

## 2011-09-18 LAB — BASIC METABOLIC PANEL
BUN: 4 — ABNORMAL LOW
BUN: 5 — ABNORMAL LOW
BUN: 7
BUN: 8
CO2: 24
CO2: 26
CO2: 27
CO2: 30
CO2: 31
Calcium: 8.8
Calcium: 8 — ABNORMAL LOW
Calcium: 8 — ABNORMAL LOW
Calcium: 8.1 — ABNORMAL LOW
Calcium: 8.2 — ABNORMAL LOW
Chloride: 101
Chloride: 102
Chloride: 105
Chloride: 107
Creatinine, Ser: 0.84
Creatinine, Ser: 0.8
Creatinine, Ser: 0.81
Creatinine, Ser: 0.81
Creatinine, Ser: 0.9
GFR calc Af Amer: >60
GFR calc Af Amer: >60
GFR calc Af Amer: >60
GFR calc Af Amer: >60
GFR calc Af Amer: >60
GFR calc non Af Amer: >60
GFR calc non Af Amer: >60
GFR calc non Af Amer: >60
GFR calc non Af Amer: >60
Glucose, Bld: 101 — ABNORMAL HIGH
Glucose, Bld: 103 — ABNORMAL HIGH
Glucose, Bld: 105 — ABNORMAL HIGH
Glucose, Bld: 91
Glucose, Bld: 93
Potassium: 4
Potassium: 4
Potassium: 4.2
Potassium: 4.3
Sodium: 134 — ABNORMAL LOW
Sodium: 135
Sodium: 136
Sodium: 138

## 2011-09-18 LAB — POCT I-STAT 3, ART BLOOD GAS (G3+)
Acid-base deficit: 1
Acid-base deficit: 2
Acid-base deficit: 3 — ABNORMAL HIGH
Bicarbonate: 26.8 — ABNORMAL HIGH
O2 Saturation: 100
O2 Saturation: 100
O2 Saturation: 96
Patient temperature: 34.7
TCO2: 25
TCO2: 25
TCO2: 28
pCO2 arterial: 40.3
pCO2 arterial: 42.7
pCO2 arterial: 45.7 — ABNORMAL HIGH
pH, Arterial: 7.355
pH, Arterial: 7.376
pO2, Arterial: 250 — ABNORMAL HIGH
pO2, Arterial: 327 — ABNORMAL HIGH
pO2, Arterial: 60 — ABNORMAL LOW
pO2, Arterial: 88

## 2011-09-18 LAB — DIFFERENTIAL
Basophils Absolute: 0.2 — ABNORMAL HIGH
Eosinophils Relative: 2
Lymphocytes Relative: 22
Lymphs Abs: 1.8
Monocytes Absolute: 0.7
Monocytes Relative: 8
Neutro Abs: 5.6

## 2011-09-18 LAB — BLOOD GAS, ARTERIAL
Bicarbonate: 23.6
FIO2: 0.21
Patient temperature: 98.3
pH, Arterial: 7.411

## 2011-09-18 LAB — POCT I-STAT 3, VENOUS BLOOD GAS (G3P V)
Acid-base deficit: 2
O2 Saturation: 80
TCO2: 26
pCO2, Ven: 48.5

## 2011-09-18 LAB — COMPREHENSIVE METABOLIC PANEL
AST: 19
Albumin: 3.7
BUN: 8
Calcium: 9.5
Creatinine, Ser: 0.9
GFR calc Af Amer: >60
Total Protein: 7

## 2011-09-18 LAB — HEMOGLOBIN A1C: Mean Plasma Glucose: 126

## 2011-09-18 LAB — POCT CARDIAC MARKERS
CKMB, poc: <1 — ABNORMAL LOW
Myoglobin, poc: 35.5
Troponin i, poc: <0.05

## 2011-09-18 LAB — APTT
aPTT: 30
aPTT: 36

## 2011-09-18 LAB — CREATININE, SERUM: GFR calc Af Amer: >60

## 2011-09-18 LAB — LIPID PANEL
Cholesterol: 209 — ABNORMAL HIGH
HDL: 31 — ABNORMAL LOW
LDL Cholesterol: 157 — ABNORMAL HIGH

## 2011-09-18 LAB — PLATELET COUNT: Platelets: 135 — ABNORMAL LOW

## 2011-09-18 LAB — TYPE AND SCREEN: ABO/RH(D): A POS

## 2011-09-18 LAB — HEMOGLOBIN AND HEMATOCRIT, BLOOD
HCT: 27.2 — ABNORMAL LOW
Hemoglobin: 9.1 — ABNORMAL LOW

## 2011-09-18 LAB — TSH: TSH: 0.718

## 2011-09-18 LAB — CK TOTAL AND CKMB (NOT AT ARMC): Relative Index: INVALID

## 2012-06-02 ENCOUNTER — Ambulatory Visit: Payer: Self-pay | Admitting: Vascular Surgery

## 2012-06-02 DIAGNOSIS — I509 Heart failure, unspecified: Secondary | ICD-10-CM | POA: Diagnosis not present

## 2012-06-02 DIAGNOSIS — Z9889 Other specified postprocedural states: Secondary | ICD-10-CM | POA: Diagnosis not present

## 2012-06-02 DIAGNOSIS — I2510 Atherosclerotic heart disease of native coronary artery without angina pectoris: Secondary | ICD-10-CM | POA: Diagnosis not present

## 2012-06-02 DIAGNOSIS — Z87891 Personal history of nicotine dependence: Secondary | ICD-10-CM | POA: Diagnosis not present

## 2012-06-02 DIAGNOSIS — I519 Heart disease, unspecified: Secondary | ICD-10-CM | POA: Diagnosis not present

## 2012-06-02 DIAGNOSIS — I10 Essential (primary) hypertension: Secondary | ICD-10-CM | POA: Diagnosis not present

## 2012-06-02 DIAGNOSIS — I70229 Atherosclerosis of native arteries of extremities with rest pain, unspecified extremity: Secondary | ICD-10-CM | POA: Diagnosis not present

## 2012-06-02 DIAGNOSIS — I8390 Asymptomatic varicose veins of unspecified lower extremity: Secondary | ICD-10-CM | POA: Diagnosis not present

## 2012-06-02 DIAGNOSIS — Z79899 Other long term (current) drug therapy: Secondary | ICD-10-CM | POA: Diagnosis not present

## 2012-06-02 DIAGNOSIS — E7800 Pure hypercholesterolemia, unspecified: Secondary | ICD-10-CM | POA: Diagnosis not present

## 2012-06-02 LAB — BASIC METABOLIC PANEL
BUN: 14 mg/dL (ref 7–18)
Co2: 24 mmol/L (ref 21–32)
EGFR (African American): >60
EGFR (Non-African Amer.): >60
Glucose: 122 mg/dL — ABNORMAL HIGH (ref 65–99)
Osmolality: 281 (ref 275–301)

## 2012-06-16 DIAGNOSIS — I70219 Atherosclerosis of native arteries of extremities with intermittent claudication, unspecified extremity: Secondary | ICD-10-CM | POA: Diagnosis not present

## 2012-06-16 DIAGNOSIS — I739 Peripheral vascular disease, unspecified: Secondary | ICD-10-CM | POA: Diagnosis not present

## 2012-06-16 DIAGNOSIS — I2510 Atherosclerotic heart disease of native coronary artery without angina pectoris: Secondary | ICD-10-CM | POA: Diagnosis not present

## 2012-06-16 DIAGNOSIS — E782 Mixed hyperlipidemia: Secondary | ICD-10-CM | POA: Diagnosis not present

## 2012-06-20 ENCOUNTER — Ambulatory Visit: Payer: Self-pay | Admitting: Anesthesiology

## 2012-06-20 DIAGNOSIS — M545 Low back pain, unspecified: Secondary | ICD-10-CM | POA: Diagnosis not present

## 2012-06-20 DIAGNOSIS — M79609 Pain in unspecified limb: Secondary | ICD-10-CM | POA: Diagnosis not present

## 2012-06-20 DIAGNOSIS — IMO0002 Reserved for concepts with insufficient information to code with codable children: Secondary | ICD-10-CM | POA: Diagnosis not present

## 2012-06-20 DIAGNOSIS — M5137 Other intervertebral disc degeneration, lumbosacral region: Secondary | ICD-10-CM | POA: Diagnosis not present

## 2012-08-10 HISTORY — PX: CARDIOVASCULAR STRESS TEST: SHX262

## 2012-09-01 DIAGNOSIS — E782 Mixed hyperlipidemia: Secondary | ICD-10-CM | POA: Diagnosis not present

## 2012-09-01 DIAGNOSIS — I739 Peripheral vascular disease, unspecified: Secondary | ICD-10-CM | POA: Diagnosis not present

## 2012-09-01 DIAGNOSIS — I2510 Atherosclerotic heart disease of native coronary artery without angina pectoris: Secondary | ICD-10-CM | POA: Diagnosis not present

## 2012-09-05 DIAGNOSIS — Z7901 Long term (current) use of anticoagulants: Secondary | ICD-10-CM | POA: Diagnosis not present

## 2012-09-05 DIAGNOSIS — R5381 Other malaise: Secondary | ICD-10-CM | POA: Diagnosis not present

## 2012-09-05 DIAGNOSIS — R5383 Other fatigue: Secondary | ICD-10-CM | POA: Diagnosis not present

## 2012-09-05 DIAGNOSIS — R6889 Other general symptoms and signs: Secondary | ICD-10-CM | POA: Diagnosis not present

## 2012-09-14 ENCOUNTER — Other Ambulatory Visit: Payer: Self-pay | Admitting: Cardiovascular Disease

## 2012-09-15 ENCOUNTER — Ambulatory Visit (HOSPITAL_COMMUNITY)
Admission: RE | Admit: 2012-09-15 | Discharge: 2012-09-16 | Disposition: A | Discharge status: Home / Self Care | Payer: BC Managed Care – PPO | Source: Ambulatory Visit | Attending: Cardiovascular Disease | Admitting: Cardiovascular Disease

## 2012-09-15 ENCOUNTER — Encounter (HOSPITAL_COMMUNITY): Payer: Self-pay | Admitting: General Practice

## 2012-09-15 ENCOUNTER — Encounter (HOSPITAL_COMMUNITY)
Admission: RE | Disposition: A | Discharge status: Home / Self Care | Payer: Self-pay | Source: Ambulatory Visit | Attending: Cardiovascular Disease

## 2012-09-15 DIAGNOSIS — Z951 Presence of aortocoronary bypass graft: Secondary | ICD-10-CM

## 2012-09-15 DIAGNOSIS — I70219 Atherosclerosis of native arteries of extremities with intermittent claudication, unspecified extremity: Secondary | ICD-10-CM | POA: Insufficient documentation

## 2012-09-15 DIAGNOSIS — I2510 Atherosclerotic heart disease of native coronary artery without angina pectoris: Secondary | ICD-10-CM | POA: Diagnosis present

## 2012-09-15 DIAGNOSIS — Z23 Encounter for immunization: Secondary | ICD-10-CM | POA: Insufficient documentation

## 2012-09-15 DIAGNOSIS — E785 Hyperlipidemia, unspecified: Secondary | ICD-10-CM | POA: Diagnosis present

## 2012-09-15 DIAGNOSIS — I739 Peripheral vascular disease, unspecified: Secondary | ICD-10-CM | POA: Diagnosis present

## 2012-09-15 DIAGNOSIS — I10 Essential (primary) hypertension: Secondary | ICD-10-CM

## 2012-09-15 DIAGNOSIS — E1169 Type 2 diabetes mellitus with other specified complication: Secondary | ICD-10-CM | POA: Diagnosis present

## 2012-09-15 DIAGNOSIS — I110 Hypertensive heart disease with heart failure: Secondary | ICD-10-CM | POA: Diagnosis present

## 2012-09-15 HISTORY — DX: Atherosclerotic heart disease of native coronary artery without angina pectoris: I25.10

## 2012-09-15 HISTORY — DX: Unspecified hemorrhoids: K64.9

## 2012-09-15 HISTORY — DX: Peripheral vascular disease, unspecified: I73.9

## 2012-09-15 HISTORY — PX: LOWER EXTREMITY ANGIOGRAM: SHX5508

## 2012-09-15 HISTORY — DX: Dyslexia and alexia: R48.0

## 2012-09-15 HISTORY — DX: Cerebral infarction, unspecified: I63.9

## 2012-09-15 HISTORY — PX: OTHER SURGICAL HISTORY: SHX169

## 2012-09-15 HISTORY — DX: Pure hypercholesterolemia, unspecified: E78.00

## 2012-09-15 HISTORY — DX: Other complications of anesthesia, initial encounter: T88.59XA

## 2012-09-15 HISTORY — DX: Personal history of other diseases of the digestive system: Z87.19

## 2012-09-15 HISTORY — DX: Essential (primary) hypertension: I10

## 2012-09-15 HISTORY — DX: Adverse effect of unspecified anesthetic, initial encounter: T41.45XA

## 2012-09-15 HISTORY — PX: INSERTION OF ILIAC STENT: SHX6256

## 2012-09-15 LAB — POCT ACTIVATED CLOTTING TIME
Activated Clotting Time: 169 seconds
Activated Clotting Time: 229 seconds

## 2012-09-15 SURGERY — ANGIOGRAM, LOWER EXTREMITY
Anesthesia: LOCAL

## 2012-09-15 MED ORDER — HEPARIN SODIUM (PORCINE) 1000 UNIT/ML IJ SOLN
INTRAMUSCULAR | Status: AC
  Filled 2012-09-15: qty 1

## 2012-09-15 MED ORDER — METOPROLOL TARTRATE 12.5 MG HALF TABLET
12.5000 mg | ORAL_TABLET | Freq: Two times a day (BID) | ORAL | Status: DC
  Administered 2012-09-15 – 2012-09-16 (×2): 12.5 mg via ORAL
  Filled 2012-09-15 (×3): qty 1

## 2012-09-15 MED ORDER — ACETAMINOPHEN 325 MG PO TABS: 650.0000 mg | ORAL_TABLET | ORAL | Status: DC | PRN

## 2012-09-15 MED ORDER — CLOPIDOGREL BISULFATE 75 MG PO TABS
75.0000 mg | ORAL_TABLET | Freq: Every day | ORAL | Status: DC
  Filled 2012-09-15: qty 1

## 2012-09-15 MED ORDER — LISINOPRIL 20 MG PO TABS
20.0000 mg | ORAL_TABLET | Freq: Every day | ORAL | Status: DC
  Administered 2012-09-16: 20 mg via ORAL
  Filled 2012-09-15 (×2): qty 1

## 2012-09-15 MED ORDER — FENTANYL CITRATE 0.05 MG/ML IJ SOLN
INTRAMUSCULAR | Status: AC
  Filled 2012-09-15: qty 2

## 2012-09-15 MED ORDER — OXYCODONE HCL 5 MG PO TABS
5.0000 mg | ORAL_TABLET | Freq: Four times a day (QID) | ORAL | Status: DC | PRN
  Administered 2012-09-15 – 2012-09-16 (×3): 5 mg via ORAL
  Filled 2012-09-15 (×3): qty 1

## 2012-09-15 MED ORDER — INFLUENZA VIRUS VACC SPLIT PF IM SUSP
0.5000 mL | INTRAMUSCULAR | Status: AC
  Administered 2012-09-16: 0.5 mL via INTRAMUSCULAR
  Filled 2012-09-15: qty 0.5

## 2012-09-15 MED ORDER — OXYCODONE-ACETAMINOPHEN 5-325 MG PO TABS
1.0000 | ORAL_TABLET | Freq: Four times a day (QID) | ORAL | Status: DC | PRN
Start: 2012-09-15 — End: 2012-09-16
  Administered 2012-09-15 – 2012-09-16 (×3): 1 via ORAL
  Filled 2012-09-15 (×3): qty 1

## 2012-09-15 MED ORDER — ATORVASTATIN CALCIUM 20 MG PO TABS
20.0000 mg | ORAL_TABLET | Freq: Every day | ORAL | Status: DC
  Filled 2012-09-15 (×2): qty 1

## 2012-09-15 MED ORDER — ASPIRIN 81 MG PO CHEW: 324.0000 mg | CHEWABLE_TABLET | ORAL | Status: DC

## 2012-09-15 MED ORDER — SODIUM CHLORIDE 0.9 % IV SOLN
INTRAVENOUS | Status: AC
  Administered 2012-09-15: 17:00:00 via INTRAVENOUS

## 2012-09-15 MED ORDER — HEPARIN (PORCINE) IN NACL 2-0.9 UNIT/ML-% IJ SOLN
INTRAMUSCULAR | Status: AC
  Filled 2012-09-15: qty 500

## 2012-09-15 MED ORDER — SIMVASTATIN 40 MG PO TABS
40.0000 mg | ORAL_TABLET | Freq: Every evening | ORAL | Status: DC
  Filled 2012-09-15: qty 1

## 2012-09-15 MED ORDER — ATROPINE SULFATE 1 MG/ML IJ SOLN
INTRAMUSCULAR | Status: AC
  Filled 2012-09-15: qty 1

## 2012-09-15 MED ORDER — LIDOCAINE HCL (PF) 1 % IJ SOLN
INTRAMUSCULAR | Status: AC
  Filled 2012-09-15: qty 30

## 2012-09-15 MED ORDER — AMLODIPINE BESYLATE 5 MG PO TABS
5.0000 mg | ORAL_TABLET | Freq: Every day | ORAL | Status: DC
  Administered 2012-09-16: 5 mg via ORAL
  Filled 2012-09-15 (×2): qty 1

## 2012-09-15 MED ORDER — NITROGLYCERIN 0.2 MG/ML ON CALL CATH LAB
INTRAVENOUS | Status: AC
  Filled 2012-09-15: qty 1

## 2012-09-15 MED ORDER — ONDANSETRON HCL 4 MG/2ML IJ SOLN: 4.0000 mg | Freq: Four times a day (QID) | INTRAMUSCULAR | Status: DC | PRN

## 2012-09-15 MED ORDER — CLOPIDOGREL BISULFATE 75 MG PO TABS
75.0000 mg | ORAL_TABLET | Freq: Every day | ORAL | Status: DC
  Administered 2012-09-16: 09:00:00 75 mg via ORAL
  Filled 2012-09-15: qty 1

## 2012-09-15 MED ORDER — SODIUM CHLORIDE 0.9 % IJ SOLN: 3.0000 mL | INTRAMUSCULAR | Status: DC | PRN

## 2012-09-15 MED ORDER — MIDAZOLAM HCL 2 MG/2ML IJ SOLN
INTRAMUSCULAR | Status: AC
  Filled 2012-09-15: qty 2

## 2012-09-15 MED ORDER — ASPIRIN EC 325 MG PO TBEC
325.0000 mg | DELAYED_RELEASE_TABLET | Freq: Every day | ORAL | Status: DC
  Administered 2012-09-16: 10:00:00 325 mg via ORAL
  Filled 2012-09-15: qty 1

## 2012-09-15 MED ORDER — OXYCODONE-ACETAMINOPHEN 10-325 MG PO TABS: 1.0000 | ORAL_TABLET | Freq: Four times a day (QID) | ORAL | Status: DC | PRN

## 2012-09-15 MED ORDER — SODIUM CHLORIDE 0.9 % IV SOLN
INTRAVENOUS | Status: DC
  Administered 2012-09-15: 1000 mL via INTRAVENOUS

## 2012-09-15 NOTE — H&P (Signed)
  H & P will be scanned in.  Pt was reexamined and existing H & P reviewed. No changes found.  [name], MD [facility] 09/15/2012 1:29 PM

## 2012-09-15 NOTE — Op Note (Signed)
Lucas Weiss is a 56 y.o. male    [id number] LOCATION:  FACILITY: [facility]  PHYSICIAN: Nanetta Batty, M.D. 1956-01-23   DATE OF PROCEDURE:  09/15/2012  DATE OF DISCHARGE:  [facility]  CARDIAC CATHETERIZATION     History obtained from chart review. Lucas Weiss is a 56 year old separated Caucasian male fathered 2, patient of Dr. Rocco Serene. He has a history of coronary artery disease status post coronary bypass grafting by Dr. Melrose Nakayama 2009. He has PVO D. Status post stenting of his left common iliac artery left SFA by Dr. Cipriano Bunker Townsend. He's had progressive claudication with Dopplers did show occluded SFAs bilaterally with mildly high grade left common iliac artery stenosis. System for angiography and potential for definitive intervention for lifestyle limiting claudication   PROCEDURE DESCRIPTION:    The patient was brought to the second floor  [facility] lab in the postabsorptive state. He was  premedicated with Valium 5 mg by mouth, IV Versed and fentanyl.Marland Kitchen His left groinwas prepped and shaved in usual sterile fashion. Xylocaine 1% was used for local anesthesia. A 5 French sheath was inserted into the left common femoral artery using standard Seldinger technique. The patient received 9000 units  of heparin  intravenously.  Total contrast administered the patient was 230 cc. A 5 French catheter was used for abdominal aortography with bifemoral runoff using bolus chase digital subtraction step staple technique. Visipaque dye was used for the entirety of the case. Retrograde aorta pressures monitored in the case.    HEMODYNAMICS:    AO SYSTOLIC/AO DIASTOLIC: 110/61    ANGIOGRAPHIC RESULTS:   1:, abdominal aortogram- the patient had bilateral accessory artery renal artery stenosis, 70% right inferior accessory, 90% left inferior accessory. The infrarenal abdominal history of of a significant atherosclerotic changes  2: Left  lower extremity-patent stent proximal left common iliac artery with moderate in-stent restenosis within the proximal portion. The SFA was occluded proximally reconstituted and the abductor canal. There is two-vessel runoff the peroneal.  3: Right lower extremity-the right SFA was occluded in its midportion with reconstitution by profunda femoris collaterals in the adductor canal. There was 2 vessel run off    IMPRESSION:bilateral occluded SFAs probably not amenable to operative intervention. I did perform a pullback gradient across the origin of the left common iliac artery using 5 French endhole catheter and 200 mcg of intra-arterial occlusion. Gradient was approximately 40-50 mm of mercury. Based on this as well as the patient's symptoms and Doppler evidence of left common iliac artery disease. We'll proceed with PTA and restenting using an ICast  covered stent.   Procedure description: the short 5 French sheath was exchanged over an 035 wire for a 7 French Bright tip sheath. The Versicore  wire was then advanced across this previously stented segment and a 7 mm but by 20 mm long ICast cover stent was then carefully positioned and deployed at 10 atmospheres resulting reduction of a 50-60% "in-stent restenosis and put to 0% residual. The 7 French brachial sheath was then exchanged over the Versicore  wire for a 7 French short sheath and a maintenance closure device was deployed with successful hemostasis. The patient left the Cath Lab in stable condition. He did become transiently hypotensive during the case probably vagal response to pain responding to atropine and IV fluid resuscitation. Plans will be to treat with aspirin and Plavix, hydrate overnight and discharge him in the morning. He'll get followup Dopplers and will see Dr.  Alanda Amass her back in the office. His anatomy is suitable for a lateral femoropopliteal bypass grafting.  [name] MD, [facility] 09/15/2012 2:50 PM

## 2012-09-16 DIAGNOSIS — I2510 Atherosclerotic heart disease of native coronary artery without angina pectoris: Secondary | ICD-10-CM | POA: Diagnosis present

## 2012-09-16 DIAGNOSIS — Z951 Presence of aortocoronary bypass graft: Secondary | ICD-10-CM

## 2012-09-16 DIAGNOSIS — I110 Hypertensive heart disease with heart failure: Secondary | ICD-10-CM | POA: Diagnosis present

## 2012-09-16 DIAGNOSIS — E1169 Type 2 diabetes mellitus with other specified complication: Secondary | ICD-10-CM | POA: Diagnosis present

## 2012-09-16 DIAGNOSIS — I739 Peripheral vascular disease, unspecified: Secondary | ICD-10-CM | POA: Diagnosis present

## 2012-09-16 DIAGNOSIS — E785 Hyperlipidemia, unspecified: Secondary | ICD-10-CM | POA: Diagnosis present

## 2012-09-16 LAB — BASIC METABOLIC PANEL
BUN: 10 mg/dL (ref 6–23)
CO2: 26 mEq/L (ref 19–32)
Calcium: 9 mg/dL (ref 8.4–10.5)
Creatinine, Ser: 1.03 mg/dL (ref 0.50–1.35)

## 2012-09-16 LAB — CBC
MCH: 29.4 pg (ref 26.0–34.0)
MCHC: 33.4 g/dL (ref 30.0–36.0)
MCV: 87.9 fL (ref 78.0–100.0)
Platelets: 160 10*3/uL (ref 150–400)
RDW: 14.4 % (ref 11.5–15.5)
WBC: 6.8 10*3/uL (ref 4.0–10.5)

## 2012-09-16 MED ORDER — ASPIRIN 325 MG PO TBEC: 325.0000 mg | DELAYED_RELEASE_TABLET | Freq: Every day | ORAL | Status: DC

## 2012-09-16 NOTE — Discharge Summary (Signed)
Physician Discharge Summary  Patient ID: Lucas Weiss MRN: [id number] DOB/AGE: 54-Jun-1957 54 y.o.  Admit date: 09/15/2012 Discharge date: 09/16/2012  Admission Diagnoses:  PAD  Discharge Diagnoses:  Principal Problem:  *PAD (peripheral artery disease) Active Problems:  CAD (coronary artery disease)  S/P CABG x 5: 2009  HTN (hypertension)  HLD (hyperlipidemia)   Discharged Condition: stable  Hospital Course:   Lucas Weiss is a 56 year old separated Caucasian male fathered 2, patient of Dr. Rocco Weiss. He has a history of coronary artery disease status post coronary bypass grafting by Dr. Melrose Weiss 2009. He has PVO D. Status post stenting of his left common iliac artery left SFA by Dr. Cipriano Bunker Weiss. He's had progressive claudication with Dopplers did show occluded SFAs bilaterally with mildly high grade left common iliac artery stenosis.  HE presented for PV angiography and potential for definitive intervention for lifestyle limiting claudication.  The study revealed bilateral occluded SFAs.  The patient received a 7mm by 20mm ICast cover stent to the left common iliac.  He was seen by Lucas Weiss who feels he is stable for DC home.  Followup LEA dopplers.  ASA, Plavix   Consults: None  Significant Diagnostic Studies:   PV Angiogram PROCEDURE DESCRIPTION:  The patient was brought to the second floor  [facility] cath lab in the postabsorptive state. He was  premedicated with Valium 5 mg by mouth, IV Versed and fentanyl.Marland Kitchen His left groinwas prepped and shaved in usual sterile fashion. Xylocaine 1% was used for local anesthesia. A 5 French sheath was inserted into the left common femoral artery using standard Seldinger technique. The patient received 9000 units of heparin intravenously. Total contrast administered the patient was 230 cc. A 5 French catheter was used for abdominal aortography with bifemoral runoff using bolus chase digital subtraction step staple  technique. Visipaque dye was used for the entirety of the case. Retrograde aorta pressures monitored in the case.  HEMODYNAMICS:  AO SYSTOLIC/AO DIASTOLIC: 110/61  ANGIOGRAPHIC RESULTS:  1:, abdominal aortogram- the patient had bilateral accessory artery renal artery stenosis, 70% right inferior accessory, 90% left inferior accessory. The infrarenal abdominal history of of a significant atherosclerotic changes  2: Left lower extremity-patent stent proximal left common iliac artery with moderate in-stent restenosis within the proximal portion. The SFA was occluded proximally reconstituted and the abductor canal. There is two-vessel runoff the peroneal.  3: Right lower extremity-the right SFA was occluded in its midportion with reconstitution by profunda femoris collaterals in the adductor canal. There was 2 vessel run off  IMPRESSION:bilateral occluded SFAs probably not amenable to operative intervention. I did perform a pullback gradient across the origin of the left common iliac artery using 5 French endhole catheter and 200 mcg of intra-arterial occlusion. Gradient was approximately 40-50 mm of mercury. Based on this as well as the patient's symptoms and Doppler evidence of left common iliac artery disease. We'll proceed with PTA and restenting using an ICast covered stent.  Procedure description: the short 5 French sheath was exchanged over an 035 wire for a 7 French Bright tip sheath. The Versicore wire was then advanced across this previously stented segment and a 7 mm but by 20 mm long ICast cover stent was then carefully positioned and deployed at 10 atmospheres resulting reduction of a 50-60% "in-stent restenosis and put to 0% residual. The 7 French brachial sheath was then exchanged over the Versicore wire for a 7 French short sheath and a maintenance closure device was deployed with  successful hemostasis. The patient left the Cath Lab in stable condition. He did become transiently hypotensive  during the case probably vagal response to pain responding to atropine and IV fluid resuscitation. Plans will be to treat with aspirin and Plavix, hydrate overnight and discharge him in the morning. He'll get followup Dopplers and will see Dr. Alanda Weiss her back in the office. His anatomy is suitable for a lateral femoropopliteal bypass grafting.  [name] MD, [facility]  BMET    Component Value Date/Time   NA 138 09/16/2012 0548   K 4.2 09/16/2012 0548   CL 105 09/16/2012 0548   CO2 26 09/16/2012 0548   GLUCOSE 99 09/16/2012 0548   BUN 10 09/16/2012 0548   CREATININE 1.03 09/16/2012 0548   CALCIUM 9.0 09/16/2012 0548   GFRNONAA 81* 09/16/2012 0548   GFRAA >90 09/16/2012 0548   CBC    Component Value Date/Time   WBC 6.8 09/16/2012 0548   RBC 3.98* 09/16/2012 0548   HGB 11.7* 09/16/2012 0548   HCT 35.0* 09/16/2012 0548   PLT 160 09/16/2012 0548   MCV 87.9 09/16/2012 0548   MCH 29.4 09/16/2012 0548   MCHC 33.4 09/16/2012 0548   RDW 14.4 09/16/2012 0548   LYMPHSABS 1.5 07/16/2011 1507   MONOABS 0.6 07/16/2011 1507   EOSABS 0.1 07/16/2011 1507   BASOSABS 0.1 07/16/2011 1507   Treatments: ICast stent to left iliac  Discharge Exam: Blood pressure 117/54, pulse 86, temperature 98.2 F (36.8 C), temperature source Oral, resp. rate 16, height 5\' 10"  (1.778 m), weight 79.8 kg (175 lb 14.8 oz), SpO2 95.00%.   Disposition: 01-Home or Self Care  Discharge Orders    Future Orders Please Complete By Expires   Diet - low sodium heart healthy      Increase activity slowly      Discharge instructions      Comments:   No lifting more than a half-gallon of milk or driving for three days.       Medication List     As of 09/16/2012  9:26 AM    TAKE these medications         amLODipine 5 MG tablet   Commonly known as: NORVASC   Take 5 mg by mouth daily.      aspirin 325 MG EC tablet   Take 1 tablet (325 mg total) by mouth daily.      clopidogrel 75 MG tablet   Commonly known as: PLAVIX   Take  75 mg by mouth daily.      lisinopril 20 MG tablet   Commonly known as: PRINIVIL,ZESTRIL   Take 20 mg by mouth daily.      metoprolol tartrate 25 MG tablet   Commonly known as: LOPRESSOR   Take 12.5 mg by mouth 2 (two) times daily.      oxyCODONE-acetaminophen 10-325 MG per tablet   Commonly known as: PERCOCET   Take 1 tablet by mouth every 6 (six) hours as needed. For pain      simvastatin 40 MG tablet   Commonly known as: ZOCOR   Take 40 mg by mouth every evening.           Follow-up Information    Follow up with [facility], Pearletha Furl, MD. (Our office will call you with the appt. date and time.)    Contact information:   [address]  [place] [address] [phone]          Signed: Leron Croak,  Granite Godman 09/16/2012, 9:26 AM

## 2012-09-16 NOTE — Progress Notes (Signed)
Pt given discharge instructions via teach back method and patient verbalized understanding of instructions.  Pt vital signs within normal limits and pt had no complaints of pain.  Pt escorted out of building via wheelchair to private vehicle.

## 2012-09-16 NOTE — Progress Notes (Signed)
[facility]  Subjective: A little sore at left groin  Objective: Vital signs in last 24 hours: Temp:  [97.8 F (36.6 C)-98.3 F (36.8 C)] 97.9 F (36.6 C) (09/27 0530) Pulse Rate:  [85-100] 86  (09/26 2140) Resp:  [15-24] 16  (09/26 2328) BP: (108-147)/(53-70) 117/54 mmHg (09/26 2328) SpO2:  [96 %-97 %] 96 % (09/27 0530) Weight:  [79.8 kg (175 lb 14.8 oz)-80.74 kg (178 lb)] 79.8 kg (175 lb 14.8 oz) (09/27 0530) Last BM Date: 09/14/12  Intake/Output from previous day: 09/26 0701 - 09/27 0700 In: 1752.5 [P.O.:1040; I.V.:712.5] Out: 1000 [Urine:1000] Intake/Output this shift:    Medications Current Facility-Administered Medications  Medication Dose Route Frequency Provider Last Rate Last Dose  . 0.9 %  sodium chloride infusion   Intravenous Continuous [name], MD 75 mL/hr at 09/15/12 1630    . acetaminophen (TYLENOL) tablet 650 mg  650 mg Oral Q4H PRN [name], MD      . amLODipine (NORVASC) tablet 5 mg  5 mg Oral Daily [name], MD      . aspirin EC tablet 325 mg  325 mg Oral Daily [name], MD      . atorvastatin (LIPITOR) tablet 20 mg  20 mg Oral q1800 [name], MD      . atropine 1 MG/ML injection           . clopidogrel (PLAVIX) tablet 75 mg  75 mg Oral Daily [name], MD      . clopidogrel (PLAVIX) tablet 75 mg  75 mg Oral Q breakfast [name], MD      . fentaNYL (SUBLIMAZE) 0.05 MG/ML injection           . heparin 1000 UNIT/ML injection           . heparin 2-0.9 UNIT/ML-% infusion           . influenza  inactive virus vaccine (FLUZONE/FLUARIX) injection 0.5 mL  0.5 mL Intramuscular Tomorrow-1000 [name], MD      . lidocaine (XYLOCAINE) 1 % injection           . lisinopril (PRINIVIL,ZESTRIL) tablet 20 mg  20 mg Oral Daily [name], MD      . metoprolol tartrate (LOPRESSOR) tablet 12.5 mg  12.5 mg Oral BID [name], MD   12.5 mg at 09/15/12 2140  .  midazolam (VERSED) 2 MG/2ML injection           . nitroGLYCERIN (NTG ON-CALL) 0.2 mg/mL injection           . ondansetron (ZOFRAN) injection 4 mg  4 mg Intravenous Q6H PRN [name], MD      . oxyCODONE (Oxy IR/ROXICODONE) immediate release tablet 5 mg  5 mg Oral Q6H PRN [name], MD   5 mg at 09/16/12 0529  . oxyCODONE-acetaminophen (PERCOCET/ROXICET) 5-325 MG per tablet 1 tablet  1 tablet Oral Q6H PRN [name], MD   1 tablet at 09/16/12 0529  . DISCONTD: 0.9 %  sodium chloride infusion   Intravenous Continuous [name], MD 75 mL/hr at 09/15/12 1029 1,000 mL at 09/15/12 1029  . DISCONTD: aspirin chewable tablet 324 mg  324 mg Oral Pre-Cath [name], MD      . DISCONTD: oxyCODONE-acetaminophen (PERCOCET) 10-325 MG per tablet 1 tablet  1 tablet Oral Q6H PRN [name], MD      .  DISCONTD: simvastatin (ZOCOR) tablet 40 mg  40 mg Oral QPM [name], MD      . DISCONTD: sodium chloride 0.9 % injection 3 mL  3 mL Intravenous PRN [name], MD        PE: General appearance: alert, cooperative and no distress Lungs: clear to auscultation bilaterally Heart: regular rate and rhythm, S1, S2 normal, no murmur, click, rub or gallop Extremities: No LEE Pulses: 2+ and symmetric 0.5+ DP's and PT's Skin: Warm and dry.  No hematoma, bruit or ecchymosis at cath site.  Mildly tender. Neurologic: Grossly normal  Lab Results:   Basename 09/16/12 0548  WBC 6.8  HGB 11.7*  HCT 35.0*  PLT 160   BMET  Basename 09/16/12 0548  NA 138  K 4.2  CL 105  CO2 26  GLUCOSE 99  BUN 10  CREATININE 1.03  CALCIUM 9.0   09/15/12, PV angiogram  PROCEDURE DESCRIPTION:  The patient was brought to the second floor   Cardiac cath lab in the postabsorptive state. He was  premedicated with Valium 5 mg by mouth, IV Versed and fentanyl.Marland Kitchen His left groinwas prepped and shaved in usual sterile fashion. Xylocaine 1% was used for local anesthesia. A 5  French sheath was inserted into the left common femoral artery using standard Seldinger technique. The patient received 9000 units of heparin intravenously. Total contrast administered the patient was 230 cc. A 5 French catheter was used for abdominal aortography with bifemoral runoff using bolus chase digital subtraction step staple technique. Visipaque dye was used for the entirety of the case. Retrograde aorta pressures monitored in the case.  HEMODYNAMICS:  AO SYSTOLIC/AO DIASTOLIC: 110/61  ANGIOGRAPHIC RESULTS:  1:, abdominal aortogram- the patient had bilateral accessory artery renal artery stenosis, 70% right inferior accessory, 90% left inferior accessory. The infrarenal abdominal history of of a significant atherosclerotic changes  2: Left lower extremity-patent stent proximal left common iliac artery with moderate in-stent restenosis within the proximal portion. The SFA was occluded proximally reconstituted and the abductor canal. There is two-vessel runoff the peroneal.  3: Right lower extremity-the right SFA was occluded in its midportion with reconstitution by profunda femoris collaterals in the adductor canal. There was 2 vessel run off  IMPRESSION:bilateral occluded SFAs probably not amenable to operative intervention. I did perform a pullback gradient across the origin of the left common iliac artery using 5 French endhole catheter and 200 mcg of intra-arterial occlusion. Gradient was approximately 40-50 mm of mercury. Based on this as well as the patient's symptoms and Doppler evidence of left common iliac artery disease. We'll proceed with PTA and restenting using an ICast covered stent.  Procedure description: the short 5 French sheath was exchanged over an 035 wire for a 7 French Bright tip sheath. The Versicore wire was then advanced across this previously stented segment and a 7 mm but by 20 mm long ICast cover stent was then carefully positioned and deployed at 10 atmospheres resulting  reduction of a 50-60% "in-stent restenosis and put to 0% residual. The 7 French brachial sheath was then exchanged over the Versicore wire for a 7 French short sheath and a maintenance closure device was deployed with successful hemostasis. The patient left the Cath Lab in stable condition. He did become transiently hypotensive during the case probably vagal response to pain responding to atropine and IV fluid resuscitation. Plans will be to treat with aspirin and Plavix, hydrate overnight and discharge him in the morning. He'll get followup  Dopplers and will see Dr. Alanda Amass her back in the office. His anatomy is suitable for a lateral femoropopliteal bypass grafting.  [name] MD, [facility]  Assessment/Plan  Principal Problem:  *PAD (peripheral artery disease) Active Problems:  CAD (coronary artery disease)  S/P CABG x 5: 2009  HTN (hypertension)  HLD (hyperlipidemia)  Plan:  S/P PV angiogram showing bilateral occluded SFAs probably not amenable to operative intervention.  DC home with LEA dopplers and followup with Dr. Alanda Amass.   BP and Labs Stable.   LOS: 1 day    HAGER, BRYAN 09/16/2012 8:17 AM   Agree with note written by Jones Skene PAC   S/P PV angio, PTA and covered stent prox LCIA. MYNX closure of LCFA. OK for D/C home on ASA and Plavix. LEA the ROV with Dr. Cloyde Reams. If complete revascularization necessary, will need bilateral FPBG. ROV with Dr. Alanda Amass.   [name] 09/16/2012 9:24 AM

## 2012-09-16 NOTE — Care Management Note (Signed)
    Page 1 of 1   09/16/2012     10:33:35 AM   CARE MANAGEMENT NOTE 09/16/2012  Patient:  Lucas Weiss,Lucas Weiss   Account Number:  [id number]  Date Initiated:  09/16/2012  Documentation initiated by:  CRAFT,TERRI  Subjective/Objective Assessment:   56 yo male admitted 09/15/12  with PAD     Action/Plan:   D/C when medically stable   Anticipated DC Date:  09/16/2012   Anticipated DC Plan:  HOME/SELF CARE      DC Planning Services  CM consult                 Status of service:  Completed, signed off  Discharge Disposition:  HOME/SELF CARE  Per UR Regulation:  Reviewed for med. necessity/level of care/duration of stay  Comments:  09/16/12, Kathi Der RNC-MNN, BSN, [phone], CM received referral for medication needs.  Pt discharging home on Plavix.  Pt has private insurance.  Pt given form for Plavix for discounted medication.

## 2012-09-21 DIAGNOSIS — I739 Peripheral vascular disease, unspecified: Secondary | ICD-10-CM | POA: Diagnosis not present

## 2012-10-21 DIAGNOSIS — I70219 Atherosclerosis of native arteries of extremities with intermittent claudication, unspecified extremity: Secondary | ICD-10-CM | POA: Diagnosis not present

## 2012-10-21 DIAGNOSIS — E782 Mixed hyperlipidemia: Secondary | ICD-10-CM | POA: Diagnosis not present

## 2012-10-21 DIAGNOSIS — I2510 Atherosclerotic heart disease of native coronary artery without angina pectoris: Secondary | ICD-10-CM | POA: Diagnosis not present

## 2013-01-09 ENCOUNTER — Other Ambulatory Visit (HOSPITAL_COMMUNITY): Payer: Self-pay | Admitting: Cardiovascular Disease

## 2013-01-09 DIAGNOSIS — I701 Atherosclerosis of renal artery: Secondary | ICD-10-CM

## 2013-02-14 ENCOUNTER — Ambulatory Visit (HOSPITAL_COMMUNITY)
Admission: RE | Admit: 2013-02-14 | Discharge: 2013-02-14 | Disposition: A | Discharge status: Home / Self Care | Payer: Medicare Other | Source: Ambulatory Visit | Attending: Cardiovascular Disease | Admitting: Cardiovascular Disease

## 2013-02-14 DIAGNOSIS — I701 Atherosclerosis of renal artery: Secondary | ICD-10-CM | POA: Diagnosis not present

## 2013-02-14 DIAGNOSIS — N289 Disorder of kidney and ureter, unspecified: Secondary | ICD-10-CM | POA: Diagnosis not present

## 2013-02-14 DIAGNOSIS — I10 Essential (primary) hypertension: Secondary | ICD-10-CM | POA: Diagnosis not present

## 2013-02-14 NOTE — Progress Notes (Signed)
Renal duplex completed. Shaianne Nucci D  

## 2013-02-20 ENCOUNTER — Encounter: Payer: Self-pay | Admitting: Physician Assistant

## 2013-02-20 DIAGNOSIS — I701 Atherosclerosis of renal artery: Secondary | ICD-10-CM

## 2013-07-26 ENCOUNTER — Other Ambulatory Visit: Payer: Self-pay

## 2013-07-26 MED ORDER — AMLODIPINE BESYLATE 5 MG PO TABS: 5.0000 mg | ORAL_TABLET | Freq: Every day | ORAL | Status: DC

## 2013-07-26 NOTE — Telephone Encounter (Signed)
Rx was sent to pharmacy electronically via [employer].  

## 2013-10-05 DIAGNOSIS — Z23 Encounter for immunization: Secondary | ICD-10-CM | POA: Diagnosis not present

## 2013-12-11 ENCOUNTER — Other Ambulatory Visit: Payer: Self-pay | Admitting: *Deleted

## 2013-12-11 MED ORDER — AMLODIPINE BESYLATE 5 MG PO TABS: 5.0000 mg | ORAL_TABLET | Freq: Every day | ORAL | Status: DC

## 2013-12-11 NOTE — Telephone Encounter (Signed)
Rx was sent to pharmacy electronically. 

## 2013-12-12 ENCOUNTER — Other Ambulatory Visit: Payer: Self-pay | Admitting: *Deleted

## 2013-12-12 MED ORDER — AMLODIPINE BESYLATE 5 MG PO TABS: 5.0000 mg | ORAL_TABLET | Freq: Every day | ORAL | Status: DC

## 2013-12-12 NOTE — Telephone Encounter (Signed)
Refilled amlodipine 5 mg to [facility] in [place]. Gave # [phone]. Message on RX appointment needed.

## 2013-12-18 ENCOUNTER — Other Ambulatory Visit: Payer: Self-pay | Admitting: *Deleted

## 2013-12-18 MED ORDER — LISINOPRIL 20 MG PO TABS: 20.0000 mg | ORAL_TABLET | Freq: Every day | ORAL | Status: DC

## 2013-12-20 ENCOUNTER — Other Ambulatory Visit: Payer: Self-pay | Admitting: *Deleted

## 2013-12-20 MED ORDER — METOPROLOL TARTRATE 25 MG PO TABS: 12.5000 mg | ORAL_TABLET | Freq: Two times a day (BID) | ORAL | Status: DC

## 2014-06-22 ENCOUNTER — Other Ambulatory Visit: Payer: Self-pay | Admitting: Internal Medicine

## 2014-06-26 ENCOUNTER — Other Ambulatory Visit: Payer: Self-pay | Admitting: *Deleted

## 2014-06-26 MED ORDER — METOPROLOL TARTRATE 25 MG PO TABS: ORAL_TABLET | ORAL | Status: DC

## 2014-08-02 ENCOUNTER — Other Ambulatory Visit: Payer: Self-pay | Admitting: *Deleted

## 2014-08-02 MED ORDER — METOPROLOL TARTRATE 25 MG PO TABS: ORAL_TABLET | ORAL | Status: DC

## 2014-09-12 ENCOUNTER — Ambulatory Visit: Payer: Self-pay | Admitting: Family Medicine

## 2014-09-12 DIAGNOSIS — S79919A Unspecified injury of unspecified hip, initial encounter: Secondary | ICD-10-CM | POA: Diagnosis not present

## 2014-09-12 DIAGNOSIS — M25559 Pain in unspecified hip: Secondary | ICD-10-CM | POA: Diagnosis not present

## 2014-09-12 DIAGNOSIS — M259 Joint disorder, unspecified: Secondary | ICD-10-CM | POA: Diagnosis not present

## 2014-09-12 DIAGNOSIS — S79929A Unspecified injury of unspecified thigh, initial encounter: Secondary | ICD-10-CM | POA: Diagnosis not present

## 2014-09-17 DIAGNOSIS — I2510 Atherosclerotic heart disease of native coronary artery without angina pectoris: Secondary | ICD-10-CM | POA: Diagnosis not present

## 2014-09-17 DIAGNOSIS — E1159 Type 2 diabetes mellitus with other circulatory complications: Secondary | ICD-10-CM | POA: Insufficient documentation

## 2014-09-17 DIAGNOSIS — I10 Essential (primary) hypertension: Secondary | ICD-10-CM | POA: Diagnosis not present

## 2014-09-17 DIAGNOSIS — F32A Depression, unspecified: Secondary | ICD-10-CM | POA: Insufficient documentation

## 2014-09-17 DIAGNOSIS — I152 Hypertension secondary to endocrine disorders: Secondary | ICD-10-CM | POA: Insufficient documentation

## 2014-09-17 DIAGNOSIS — F329 Major depressive disorder, single episode, unspecified: Secondary | ICD-10-CM | POA: Insufficient documentation

## 2014-09-17 DIAGNOSIS — I209 Angina pectoris, unspecified: Secondary | ICD-10-CM | POA: Diagnosis not present

## 2014-09-17 DIAGNOSIS — I6350 Cerebral infarction due to unspecified occlusion or stenosis of unspecified cerebral artery: Secondary | ICD-10-CM | POA: Diagnosis not present

## 2014-09-19 DIAGNOSIS — I10 Essential (primary) hypertension: Secondary | ICD-10-CM | POA: Diagnosis not present

## 2014-09-19 DIAGNOSIS — I2510 Atherosclerotic heart disease of native coronary artery without angina pectoris: Secondary | ICD-10-CM | POA: Diagnosis not present

## 2014-09-19 DIAGNOSIS — M25559 Pain in unspecified hip: Secondary | ICD-10-CM | POA: Diagnosis not present

## 2014-09-19 DIAGNOSIS — E785 Hyperlipidemia, unspecified: Secondary | ICD-10-CM | POA: Diagnosis not present

## 2014-09-19 DIAGNOSIS — I739 Peripheral vascular disease, unspecified: Secondary | ICD-10-CM | POA: Diagnosis not present

## 2014-09-20 DIAGNOSIS — M87052 Idiopathic aseptic necrosis of left femur: Secondary | ICD-10-CM

## 2014-09-20 HISTORY — DX: Idiopathic aseptic necrosis of left femur: M87.052

## 2014-09-21 ENCOUNTER — Ambulatory Visit: Payer: Self-pay | Admitting: Family Medicine

## 2014-09-21 DIAGNOSIS — M879 Osteonecrosis, unspecified: Secondary | ICD-10-CM | POA: Diagnosis not present

## 2014-09-21 DIAGNOSIS — M167 Other unilateral secondary osteoarthritis of hip: Secondary | ICD-10-CM | POA: Diagnosis not present

## 2014-09-21 DIAGNOSIS — M87852 Other osteonecrosis, left femur: Secondary | ICD-10-CM | POA: Diagnosis not present

## 2014-10-03 DIAGNOSIS — I10 Essential (primary) hypertension: Secondary | ICD-10-CM | POA: Diagnosis not present

## 2014-10-03 DIAGNOSIS — E785 Hyperlipidemia, unspecified: Secondary | ICD-10-CM | POA: Diagnosis not present

## 2014-10-03 DIAGNOSIS — F339 Major depressive disorder, recurrent, unspecified: Secondary | ICD-10-CM | POA: Diagnosis not present

## 2014-10-03 DIAGNOSIS — M87852 Other osteonecrosis, left femur: Secondary | ICD-10-CM | POA: Diagnosis not present

## 2014-10-03 DIAGNOSIS — I2510 Atherosclerotic heart disease of native coronary artery without angina pectoris: Secondary | ICD-10-CM | POA: Diagnosis not present

## 2014-10-10 DIAGNOSIS — M8700 Idiopathic aseptic necrosis of unspecified bone: Secondary | ICD-10-CM | POA: Diagnosis not present

## 2014-10-11 ENCOUNTER — Other Ambulatory Visit: Payer: Self-pay

## 2014-10-11 DIAGNOSIS — I739 Peripheral vascular disease, unspecified: Secondary | ICD-10-CM

## 2014-10-11 DIAGNOSIS — Z48812 Encounter for surgical aftercare following surgery on the circulatory system: Secondary | ICD-10-CM

## 2014-10-12 ENCOUNTER — Other Ambulatory Visit: Payer: Self-pay

## 2014-10-12 DIAGNOSIS — I739 Peripheral vascular disease, unspecified: Secondary | ICD-10-CM

## 2014-10-12 DIAGNOSIS — Z48812 Encounter for surgical aftercare following surgery on the circulatory system: Secondary | ICD-10-CM

## 2014-10-16 DIAGNOSIS — I2510 Atherosclerotic heart disease of native coronary artery without angina pectoris: Secondary | ICD-10-CM | POA: Diagnosis not present

## 2014-10-16 DIAGNOSIS — Z951 Presence of aortocoronary bypass graft: Secondary | ICD-10-CM | POA: Diagnosis not present

## 2014-10-16 DIAGNOSIS — I10 Essential (primary) hypertension: Secondary | ICD-10-CM | POA: Diagnosis not present

## 2014-10-16 DIAGNOSIS — Z01818 Encounter for other preprocedural examination: Secondary | ICD-10-CM | POA: Diagnosis not present

## 2014-10-24 ENCOUNTER — Encounter: Payer: Self-pay | Admitting: Vascular Surgery

## 2014-10-25 ENCOUNTER — Ambulatory Visit (INDEPENDENT_AMBULATORY_CARE_PROVIDER_SITE_OTHER): Payer: Medicare Other | Admitting: Vascular Surgery

## 2014-10-25 ENCOUNTER — Ambulatory Visit (HOSPITAL_COMMUNITY)
Admission: RE | Admit: 2014-10-25 | Discharge: 2014-10-25 | Disposition: A | Discharge status: Home / Self Care | Payer: Medicare Other | Source: Ambulatory Visit | Attending: Vascular Surgery | Admitting: Vascular Surgery

## 2014-10-25 ENCOUNTER — Ambulatory Visit (INDEPENDENT_AMBULATORY_CARE_PROVIDER_SITE_OTHER)
Admission: RE | Admit: 2014-10-25 | Discharge: 2014-10-25 | Disposition: A | Discharge status: Home / Self Care | Payer: Medicare Other | Source: Ambulatory Visit | Attending: Vascular Surgery | Admitting: Vascular Surgery

## 2014-10-25 ENCOUNTER — Encounter: Payer: Self-pay | Admitting: Vascular Surgery

## 2014-10-25 VITALS — BP 150/64 | HR 52 | Ht 70.0 in | Wt 179.2 lb

## 2014-10-25 DIAGNOSIS — I739 Peripheral vascular disease, unspecified: Secondary | ICD-10-CM

## 2014-10-25 DIAGNOSIS — Z48812 Encounter for surgical aftercare following surgery on the circulatory system: Secondary | ICD-10-CM

## 2014-10-25 DIAGNOSIS — I70219 Atherosclerosis of native arteries of extremities with intermittent claudication, unspecified extremity: Secondary | ICD-10-CM | POA: Insufficient documentation

## 2014-10-25 NOTE — Addendum Note (Signed)
Addended by: Sharee PimpleMCCHESNEY, Meleah Demeyer K on: 10/25/2014 02:50 PM   Modules accepted: Orders

## 2014-10-25 NOTE — Progress Notes (Signed)
[facility] [place] HISTORY AND PHYSICAL   History of Present Illness:  Patient is a 58 y.o. year old male who presents for evaluation of bilateral lower extremity claudication. The patient complains of pain in his left hip bilateral calves and a burning and stinging sensation in both feet. This has been present for several years. In 2013 he had a left common iliac stent in the left superficial femoral artery stent by Dr. Wyn Quakerew in [place].  He subsequently had angioplasty and restenting of his left common iliac artery in 2013 by Dr. Allyson SabalBerry. He was noted to have bilateral superficial femoral artery occlusions at that point.  He states he is unable to walk much more than about a block before experiencing pain as described above. He was being considered for a left hip replacement at [facility] but did not get along well with the orthopedic doctor. His right greater saphenous vein was previously harvested for coronary bypass grafting.  Other medical problems include coronary artery disease with no recent chest pain, stroke with residual left eye blindness left foot and hand numbness and tingling, hypertension, elevated cholesterol, borderline diabetes, renal artery stenosis, history of DVT. He is a former smoker but quit 5 years ago.he denies rest pain in the feet. He denies any nonhealing wounds.He is on Plavix and Pletal.  Past Medical History  Diagnosis Date  . Coronary artery disease   . Stroke 2009    "after CABG; ~ blind left eye since" (09/15/2012)  . Dyslexia   . Complication of anesthesia     "woke up twice during procedures @ [place]" (09/15/2012)  . PAD (peripheral artery disease)     07/2012:  right ABI 0.64, left ABI 0.57.  Marland Kitchen. Hypertension   . High cholesterol   . DVT, lower extremity ~ 2010    left  . Borderline diabetes   . Bleeding hemorrhoids   . H/O hiatal hernia   . Depression   . Tobacco abuse     Quit in 2009  . Renal artery stenosis     Bilateral    . CHF (congestive heart failure)   . DVT (deep venous thrombosis)   . Myocardial infarction     Past Surgical History  Procedure Laterality Date  . Coronary artery bypass graft  2009    2009, CABG X5, LIMA to LAD, SVG to Diagnonal, Sequential SVG to OM 2-3, SVG to PDA; Dr. Laneta SimmersBartle  . Peripheral arterial stent graft  06/20/2012; ~ 07/2012; 09/15/2012    left; left; left  . Cardiac catheterization  ?2009  . Colonoscopy w/ polypectomy      "no cancer"  . Pv angiogram  09/15/12    restenting of left common iliac for ISRS.  Bilateral occluded SFAs.  Renal artery stenosis: 70% right inferior renal artery, 90% left inferior renal artery stenosis.   . 2d echo  02/17/11    EF 50-55%, mild LVH, No WMA, mild MR, mild TR  . Cardiovascular stress test  08/10/12    Lexiscan: EF 70%, Attenuation artifact in inferior region.  No ischemia or infarct in remaining myocardium. Low risk    Social History History  Substance Use Topics  . Smoking status: Former Smoker -- 1.50 packs/day for 30 years    Types: Cigarettes    Quit date: 07/19/2008  . Smokeless tobacco: Never Used  . Alcohol Use: 6.0 oz/week    8 Cans of beer, 2 Shots of liquor per week     Comment: 09/15/2012 "  3-4 beers; shot of crown @ least 2 nights/wk"    Family History Family History  Problem Relation Age of Onset  . Diabetes Mother   . Heart disease Mother   . Hyperlipidemia Mother   . Hypertension Mother   . Heart attack Mother   . Hyperlipidemia Father   . Hypertension Father   . Heart attack Father   . Heart disease Father     before age 56  . Diabetes Sister   . Hyperlipidemia Sister   . Hypertension Sister   . Heart disease Sister     before age 56  . Diabetes Brother   . Heart disease Brother     before age 160  . Hyperlipidemia Brother   . Hypertension Brother   . Heart attack Brother     Allergies  No Known Allergies   Current Outpatient Prescriptions  Medication Sig Dispense Refill  . clopidogrel  (PLAVIX) 75 MG tablet Take 75 mg by mouth daily.    Marland Kitchen. lisinopril (PRINIVIL,ZESTRIL) 20 MG tablet Take 1 tablet (20 mg total) by mouth daily. 90 tablet 3  . lovastatin (MEVACOR) 20 MG tablet Take 20 mg by mouth at bedtime.     . metoprolol tartrate (LOPRESSOR) 25 MG tablet Take 1 tablet by mouth every morning and 1/2 tablets at night, need appointment before anymore refills 15 tablet 0  . amLODipine (NORVASC) 5 MG tablet Take 1 tablet (5 mg total) by mouth daily. 15 tablet 0  . aspirin 81 MG tablet Take 81 mg by mouth daily.    . cilostazol (PLETAL) 100 MG tablet Take 100 mg by mouth 2 (two) times daily.    . nitroGLYCERIN (NITROSTAT) 0.4 MG SL tablet Place 0.4 mg under the tongue every 5 (five) minutes as needed for chest pain.    Marland Kitchen. oxyCODONE-acetaminophen (PERCOCET) 10-325 MG per tablet Take 1 tablet by mouth every 6 (six) hours as needed. For pain    . simvastatin (ZOCOR) 40 MG tablet Take 40 mg by mouth every evening.    . traMADol (ULTRAM) 50 MG tablet Take 25 mg by mouth 2 (two) times daily as needed for pain.     No current facility-administered medications for this visit.    ROS:   General:  No weight loss, Fever, chills  HEENT: No recent headaches, no nasal bleeding, no visual changes, no sore throat  Neurologic: No dizziness, blackouts, seizures. No recent symptoms of stroke or mini- stroke. No recent episodes of slurred speech, or temporary blindness.  Cardiac: No recent episodes of chest pain/pressure, no shortness of breath at rest.  No shortness of breath with exertion.  Denies history of atrial fibrillation or irregular heartbeat  Vascular: No history of rest pain in feet.  No history of claudication.  No history of non-healing ulcer, No history of DVT   Pulmonary: No home oxygen, no productive cough, no hemoptysis,  No asthma or wheezing  Musculoskeletal:  [ ]  Arthritis, [ ]  Low back pain,  [ ]  Joint pain  Hematologic:No history of hypercoagulable state.  No history of  easy bleeding.  No history of anemia  Gastrointestinal: No hematochezia or melena,  No gastroesophageal reflux, no trouble swallowing  Urinary: [ ]  chronic Kidney disease, [ ]  on HD - [ ]  MWF or [ ]  TTHS, [ ]  Burning with urination, [ ]  Frequent urination, [ ]  Difficulty urinating;   Skin: No rashes  Psychological: No history of anxiety,  No history of depression   Physical Examination  Filed Vitals:   10/25/14 0948  BP: 150/64  Pulse: 52  Height: 5\' 10"  (1.778 m)  Weight: 179 lb 3.2 oz (81.285 kg)  SpO2: 99%    Body mass index is 25.71 kg/(m^2).  General:  Alert and oriented, no acute distress HEENT: Normal Neck: No bruit or JVD Pulmonary: Clear to auscultation bilaterally Cardiac: Regular Rate and Rhythm without murmur Abdomen: Soft, non-tender, non-distended, no mass, no scars Skin: No rash Extremity Pulses:  2+ radial, brachial, femoral, dorsalis pedis, posterior tibial pulses bilaterally Musculoskeletal: No deformity or edema, patient has a very slow gait and severely favors his left hip he is also unable to lie flat on the table today due to pain in his left hip  Neurologic: Upper and lower extremity motor 5/5 and symmetric  DATA:  Patient had bilateral ABIs performed today which I reviewed and interpreted. Right side was 0.7 to left 0.7. He also had a duplex scan of his left iliac stent which showed that this was patent with no significant renarrowing no velocity greater than 228.   ASSESSMENT:  #1 peripheral arterial disease with stable claudication ABIs reasonable at 0.7 with known bilateral superficial femoral artery occlusions #2 left hip degeneration#3 peripheral neuropathy   PLAN:  I believe at this point his left hip is more bothersome to him and his lower extremities.his primary complaint is left hip pain. In light of this I have referred him to Dr. Lajoyce Corners for further evaluation of his left hip.  #2 bilateral lower extremity claudication. Reasonable perfusion  to both lower extremities not currently at risk of limb loss a believe he needs his left hip evaluated before we consider any revascularization procedures. Also he may be managed conservatively at this point since he is not at risk of limb loss. His walking may improve if his hip improves.he will follow-up with me with repeat ABIs in 6 months.  #3 bilateral peripheral neuropathy I discussed with the patient that revascularization probably would not help the burning numbness and stinging in his feet. He may benefit from a neuropathic medication long-term if he continues to have problems with this.  Fabienne Bruns, MD [facility] [place] Office: [phone] Pager: [phone]

## 2014-10-26 DIAGNOSIS — I2510 Atherosclerotic heart disease of native coronary artery without angina pectoris: Secondary | ICD-10-CM | POA: Diagnosis not present

## 2014-10-26 DIAGNOSIS — M87852 Other osteonecrosis, left femur: Secondary | ICD-10-CM | POA: Diagnosis not present

## 2014-10-26 DIAGNOSIS — I10 Essential (primary) hypertension: Secondary | ICD-10-CM | POA: Diagnosis not present

## 2014-10-26 DIAGNOSIS — R739 Hyperglycemia, unspecified: Secondary | ICD-10-CM | POA: Diagnosis not present

## 2014-10-26 DIAGNOSIS — F339 Major depressive disorder, recurrent, unspecified: Secondary | ICD-10-CM | POA: Diagnosis not present

## 2014-10-26 DIAGNOSIS — Z01818 Encounter for other preprocedural examination: Secondary | ICD-10-CM | POA: Diagnosis not present

## 2014-10-31 DIAGNOSIS — M25552 Pain in left hip: Secondary | ICD-10-CM | POA: Diagnosis not present

## 2014-11-05 DIAGNOSIS — M25552 Pain in left hip: Secondary | ICD-10-CM | POA: Diagnosis not present

## 2014-11-26 DIAGNOSIS — F339 Major depressive disorder, recurrent, unspecified: Secondary | ICD-10-CM | POA: Diagnosis not present

## 2014-11-26 DIAGNOSIS — I10 Essential (primary) hypertension: Secondary | ICD-10-CM | POA: Diagnosis not present

## 2014-11-26 DIAGNOSIS — E785 Hyperlipidemia, unspecified: Secondary | ICD-10-CM | POA: Diagnosis not present

## 2014-11-26 DIAGNOSIS — M87852 Other osteonecrosis, left femur: Secondary | ICD-10-CM | POA: Diagnosis not present

## 2014-11-29 ENCOUNTER — Encounter (HOSPITAL_COMMUNITY): Payer: Self-pay | Admitting: Cardiovascular Disease

## 2015-01-01 DIAGNOSIS — R944 Abnormal results of kidney function studies: Secondary | ICD-10-CM | POA: Diagnosis not present

## 2015-01-10 DIAGNOSIS — Z0279 Encounter for issue of other medical certificate: Secondary | ICD-10-CM

## 2015-01-15 DIAGNOSIS — F339 Major depressive disorder, recurrent, unspecified: Secondary | ICD-10-CM | POA: Diagnosis not present

## 2015-01-15 DIAGNOSIS — I2510 Atherosclerotic heart disease of native coronary artery without angina pectoris: Secondary | ICD-10-CM | POA: Diagnosis not present

## 2015-01-15 DIAGNOSIS — R7301 Impaired fasting glucose: Secondary | ICD-10-CM | POA: Diagnosis not present

## 2015-01-15 DIAGNOSIS — I10 Essential (primary) hypertension: Secondary | ICD-10-CM | POA: Diagnosis not present

## 2015-01-15 DIAGNOSIS — M87852 Other osteonecrosis, left femur: Secondary | ICD-10-CM | POA: Diagnosis not present

## 2015-01-15 DIAGNOSIS — E785 Hyperlipidemia, unspecified: Secondary | ICD-10-CM | POA: Diagnosis not present

## 2015-02-15 DIAGNOSIS — F339 Major depressive disorder, recurrent, unspecified: Secondary | ICD-10-CM | POA: Diagnosis not present

## 2015-02-15 DIAGNOSIS — M87852 Other osteonecrosis, left femur: Secondary | ICD-10-CM | POA: Diagnosis not present

## 2015-03-15 DIAGNOSIS — F339 Major depressive disorder, recurrent, unspecified: Secondary | ICD-10-CM | POA: Diagnosis not present

## 2015-03-15 DIAGNOSIS — M87852 Other osteonecrosis, left femur: Secondary | ICD-10-CM | POA: Diagnosis not present

## 2015-04-12 DIAGNOSIS — F339 Major depressive disorder, recurrent, unspecified: Secondary | ICD-10-CM | POA: Diagnosis not present

## 2015-04-12 DIAGNOSIS — Z125 Encounter for screening for malignant neoplasm of prostate: Secondary | ICD-10-CM | POA: Diagnosis not present

## 2015-04-12 DIAGNOSIS — R7301 Impaired fasting glucose: Secondary | ICD-10-CM | POA: Diagnosis not present

## 2015-04-12 DIAGNOSIS — M87852 Other osteonecrosis, left femur: Secondary | ICD-10-CM | POA: Diagnosis not present

## 2015-04-12 DIAGNOSIS — I10 Essential (primary) hypertension: Secondary | ICD-10-CM | POA: Diagnosis not present

## 2015-04-12 DIAGNOSIS — I2510 Atherosclerotic heart disease of native coronary artery without angina pectoris: Secondary | ICD-10-CM | POA: Diagnosis not present

## 2015-04-12 DIAGNOSIS — E785 Hyperlipidemia, unspecified: Secondary | ICD-10-CM | POA: Diagnosis not present

## 2015-04-12 DIAGNOSIS — Z129 Encounter for screening for malignant neoplasm, site unspecified: Secondary | ICD-10-CM | POA: Diagnosis not present

## 2015-04-14 NOTE — Op Note (Signed)
PATIENT NAME:  Lucas Weiss, Lucas Weiss MR#:  [id number] DATE OF BIRTH:  [birth year]  DATE OF PROCEDURE:  06/02/2012  PREOPERATIVE DIAGNOSES:  1. Peripheral arterial disease with progression of rest pain in the left lower extremity.  2. Hypertension.  3. Heart disease.   POSTOPERATIVE DIAGNOSES: 1. Peripheral arterial disease with progression of rest pain in the left lower extremity.  2. Hypertension.  3. Heart disease.   PROCEDURES: 1. Catheter placement into left popliteal artery from right femoral approach.  2. Aortogram and selective left lower extremity angiogram.  3. Percutaneous transluminal angioplasty of distal external iliac artery and common femoral artery on the left with 7 mm diameter angioplasty balloon.  4. Percutaneous transluminal angioplasty of long segment left SFA and popliteal occlusion with 6 mm diameter angioplasty balloon.  5. Viabahn stent placement x2 to the left superficial femoral artery and popliteal artery for greater than 50% residual stenosis and dissection after angioplasty.  6. StarClose closure device, right femoral artery.   SURGEON: Annice Needy, MD   ANESTHESIA: Local with moderate conscious sedation.   ESTIMATED BLOOD LOSS: Approximately 25 mL.   INDICATION FOR PROCEDURE: The patient is a 59 year old white male with severe peripheral arterial disease. He has had multiple previous interventions. He has had progression of his disease on the left and now has rest pain. He is brought back today for an attempt at revascularization. Risks and benefits were discussed. Informed consent was obtained.   DESCRIPTION OF PROCEDURE: The patient was brought to the Vascular Interventional Radiology Suite. Groins were shaved and prepped and a sterile surgical field was created. The right femoral head was localized with fluoroscopy and the right femoral artery was accessed without difficulty with a Seldinger needle. J-wire and 5 French sheath was placed. Pigtail catheter was  placed in the aorta at the L1 level and AP aortogram was performed. This showed no flow-limiting stenosis in the aorta or iliac segments. The renal vessels were patent bilaterally. At this point I hooked the aortic bifurcation and advanced the left femoral head and selective left lower extremity angiogram was then performed. This showed a near flush occlusion of the left superficial femoral artery. This did not reconstitute until the popliteal artery just above the knee. He had two previously placed stents within the occlusion. He then had good two or three vessel runoff distally, although it was a little difficult to opacify on the initial images. The patient was systemically heparinized and a 6 [place] Ansel sheath was placed over a Terumo advantage wire and I was able to easily gain access to the superficial femoral artery and cross the lesion with a Kumpe catheter and Terumo advantage wire. I confirmed intraluminal flow in the below-knee popliteal artery replacing the wire. A 6 mm diameter balloon was inflated from just above the knee to the origin of the superficial femoral artery. Wastes were taken which resolved. However, there were areas above and below the previously placed stent near Hunter's canal with greater than 50% residual stenosis and dissection after angioplasty. Due to the recurrent nature of the lesions, I exchanged for an 0.018 wire and a Viabahn stent was deployed both proximal and distal and throughout the area of the old stent placement encompassing the diseased lesions. These were ironed out with a 6 mm balloon with good angiographic result. On evaluation there was an approximately 50 or 60% stenosis in the external iliac artery on the left distally and down the common femoral artery. This was treated with a 7  mm diameter angioplasty balloon with an approximately 20 or 30% residual stenosis. At this point I elected to terminate the procedure. Sheath was pulled back to the ipsilateral external  iliac artery and oblique arteriogram was performed. StarClose closure device was deployed in the usual fashion with excellent hemostatic result. The patient tolerated the procedure well and was taken to the recovery room in stable condition.   ____________________________ Annice NeedyJason S. Loudon Krakow, MD jsd:drc D: 06/02/2012 15:44:08 ET T: 06/03/2012 06:45:52 ET JOB#: [id number]  cc: Annice NeedyJason S. Desiraye Rolfson, MD, <Dictator> Annice NeedyJASON S Kacin Dancy MD ELECTRONICALLY SIGNED 06/09/2012 9:05

## 2015-04-15 DIAGNOSIS — M87052 Idiopathic aseptic necrosis of left femur: Secondary | ICD-10-CM | POA: Insufficient documentation

## 2015-04-15 DIAGNOSIS — F1011 Alcohol abuse, in remission: Secondary | ICD-10-CM | POA: Insufficient documentation

## 2015-04-24 ENCOUNTER — Encounter: Payer: Self-pay | Admitting: Vascular Surgery

## 2015-04-25 ENCOUNTER — Ambulatory Visit (HOSPITAL_COMMUNITY)
Admission: RE | Admit: 2015-04-25 | Discharge: 2015-04-25 | Disposition: A | Discharge status: Home / Self Care | Payer: Medicare Other | Source: Ambulatory Visit | Attending: Vascular Surgery | Admitting: Vascular Surgery

## 2015-04-25 ENCOUNTER — Ambulatory Visit (INDEPENDENT_AMBULATORY_CARE_PROVIDER_SITE_OTHER): Payer: Medicare Other | Admitting: Vascular Surgery

## 2015-04-25 ENCOUNTER — Encounter: Payer: Self-pay | Admitting: Vascular Surgery

## 2015-04-25 VITALS — BP 119/61 | HR 64 | Ht 69.0 in | Wt 177.0 lb

## 2015-04-25 DIAGNOSIS — I70219 Atherosclerosis of native arteries of extremities with intermittent claudication, unspecified extremity: Secondary | ICD-10-CM

## 2015-04-25 DIAGNOSIS — I739 Peripheral vascular disease, unspecified: Secondary | ICD-10-CM
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
## 2015-04-25 NOTE — Addendum Note (Signed)
Addended by: Sharee PimpleMCCHESNEY, Lazara Grieser K on: 04/25/2015 04:06 PM   Modules accepted: Orders

## 2015-05-08 DIAGNOSIS — M1632 Unilateral osteoarthritis resulting from hip dysplasia, left hip: Secondary | ICD-10-CM | POA: Diagnosis not present

## 2015-06-14 ENCOUNTER — Ambulatory Visit (INDEPENDENT_AMBULATORY_CARE_PROVIDER_SITE_OTHER): Payer: Medicare Other | Admitting: Family Medicine

## 2015-06-14 ENCOUNTER — Encounter: Payer: Self-pay | Admitting: Family Medicine

## 2015-06-14 VITALS — BP 103/66 | HR 64 | Temp 98.2°F | Ht 69.0 in | Wt 174.4 lb

## 2015-06-14 DIAGNOSIS — F332 Major depressive disorder, recurrent severe without psychotic features: Secondary | ICD-10-CM | POA: Insufficient documentation

## 2015-06-14 DIAGNOSIS — I25119 Atherosclerotic heart disease of native coronary artery with unspecified angina pectoris: Secondary | ICD-10-CM | POA: Diagnosis not present

## 2015-06-14 DIAGNOSIS — G8929 Other chronic pain: Secondary | ICD-10-CM | POA: Insufficient documentation

## 2015-06-14 DIAGNOSIS — I509 Heart failure, unspecified: Secondary | ICD-10-CM

## 2015-06-14 DIAGNOSIS — I70219 Atherosclerosis of native arteries of extremities with intermittent claudication, unspecified extremity: Secondary | ICD-10-CM

## 2015-06-14 DIAGNOSIS — M87052 Idiopathic aseptic necrosis of left femur: Secondary | ICD-10-CM | POA: Diagnosis not present

## 2015-06-14 DIAGNOSIS — I110 Hypertensive heart disease with heart failure: Secondary | ICD-10-CM | POA: Diagnosis not present

## 2015-06-14 DIAGNOSIS — I739 Peripheral vascular disease, unspecified: Secondary | ICD-10-CM | POA: Diagnosis not present

## 2015-06-14 MED ORDER — OXYCODONE-ACETAMINOPHEN 10-325 MG PO TABS: 1.0000 | ORAL_TABLET | Freq: Four times a day (QID) | ORAL | Status: DC | PRN

## 2015-06-14 MED ORDER — CITALOPRAM HYDROBROMIDE 40 MG PO TABS: 60.0000 mg | ORAL_TABLET | Freq: Every day | ORAL | Status: DC

## 2015-06-14 MED ORDER — METOPROLOL TARTRATE 25 MG PO TABS: ORAL_TABLET | ORAL | Status: DC

## 2015-06-14 MED ORDER — LOVASTATIN 20 MG PO TABS: 40.0000 mg | ORAL_TABLET | Freq: Every day | ORAL | Status: DC

## 2015-06-14 MED ORDER — NITROGLYCERIN 0.4 MG SL SUBL: 0.4000 mg | SUBLINGUAL_TABLET | SUBLINGUAL | Status: DC | PRN

## 2015-06-14 MED ORDER — CLOPIDOGREL BISULFATE 75 MG PO TABS: 75.0000 mg | ORAL_TABLET | Freq: Every day | ORAL | Status: DC

## 2015-06-14 MED ORDER — LISINOPRIL 20 MG PO TABS: 30.0000 mg | ORAL_TABLET | Freq: Every day | ORAL | Status: DC
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
## 2015-06-14 NOTE — Assessment & Plan Note (Signed)
Vascular says that he can have his hip replacement done prior to having stents. Referral to ortho made today. Will continue him on his percocet for the time being to help control his pain until he get his hip replacement. Refill given today. Call with any concerns or complaints. 2 month supply given to patient today.

## 2015-06-14 NOTE — Assessment & Plan Note (Signed)
Continue to follow with vascular. Continue to keep BP, lipids under control. Continue plavix.

## 2015-06-14 NOTE — Assessment & Plan Note (Signed)
Continue medical management. Continue to monitor.  

## 2015-06-14 NOTE — Assessment & Plan Note (Signed)
Still under poor control- however this seems to be greatly influenced by how he is feeling on any given day. Currently on max dose Celexa. Continue to monitor. We will recheck in 2 months, if still poorly controlled, will consider adjunctive treatment.

## 2015-06-14 NOTE — Assessment & Plan Note (Signed)
Under good control today. Continue current regimen. Continue to monitor.  

## 2015-06-20 ENCOUNTER — Telehealth: Payer: Self-pay

## 2015-06-20 NOTE — Telephone Encounter (Signed)
Patient states he only got 8 pills and he usually gets more Citalopram [facility] [address]

## 2015-06-20 NOTE — Telephone Encounter (Signed)
Called pharmacy, it was a partial fill. The rest of the medication should arrive today. Patient notified.

## 2015-08-15 ENCOUNTER — Ambulatory Visit (INDEPENDENT_AMBULATORY_CARE_PROVIDER_SITE_OTHER): Payer: Medicare Other | Admitting: Family Medicine

## 2015-08-15 ENCOUNTER — Encounter: Payer: Self-pay | Admitting: Family Medicine

## 2015-08-15 VITALS — BP 118/45 | HR 62 | Temp 98.6°F | Wt 174.4 lb

## 2015-08-15 DIAGNOSIS — I739 Peripheral vascular disease, unspecified: Secondary | ICD-10-CM

## 2015-08-15 DIAGNOSIS — G8929 Other chronic pain: Secondary | ICD-10-CM

## 2015-08-15 DIAGNOSIS — I959 Hypotension, unspecified: Secondary | ICD-10-CM | POA: Insufficient documentation

## 2015-08-15 DIAGNOSIS — M87052 Idiopathic aseptic necrosis of left femur: Secondary | ICD-10-CM

## 2015-08-15 DIAGNOSIS — F332 Major depressive disorder, recurrent severe without psychotic features: Secondary | ICD-10-CM

## 2015-08-15 DIAGNOSIS — I952 Hypotension due to drugs: Secondary | ICD-10-CM | POA: Diagnosis not present

## 2015-08-15 MED ORDER — LISINOPRIL 20 MG PO TABS: 20.0000 mg | ORAL_TABLET | Freq: Every day | ORAL | Status: DC

## 2015-08-15 MED ORDER — OXYCODONE-ACETAMINOPHEN 10-325 MG PO TABS: 1.0000 | ORAL_TABLET | Freq: Four times a day (QID) | ORAL | Status: DC | PRN

## 2015-08-15 NOTE — Assessment & Plan Note (Addendum)
Strongly advised patient to call ortho and have appointment to see them and discuss THR. He is afraid of blood clots and death on the table, but leg is becoming more atrophied and he is getting worse. Pain stable on medicine, but he doesn't want to have to keep taking it and it seems to be increasing his depression. 2 month supply of his pain medicine given today.

## 2015-08-15 NOTE — Assessment & Plan Note (Signed)
Again situational. Improved from last visit. Continue current regimen and see how he does in about a month. Seems to be worse with the pain and the medication for the pain. Encouraged him to follow up with ortho for total hip replacement.

## 2015-08-15 NOTE — Progress Notes (Signed)
BP 118/45 mmHg  Pulse 62  Temp(Src) 98.6 F (37 C)  Wt 174 lb 6.4 oz (79.107 kg)  SpO2 97%   Subjective:    Patient ID: Lucas Weiss, male    DOB: February 25, 1956, 59 y.o.   MRN: [id number]  HPI: [name] is a 59 y.o. male  Chief Complaint  Patient presents with  . Depression  . Pain    patient was not able to go to ortho due to transportation issues, he states that he has to call and reschedule   DEPRESSION- it's his granddaughter's birthday today and she went back home yesterday suddenly Mood status: stable Satisfied with current treatment?: no Symptom severity: moderate  Duration of current treatment : chronic Side effects: no Medication compliance: excellent compliance Psychotherapy/counseling: no  Depressed mood: yes Anxious mood: yes Anhedonia: yes Significant weight loss or gain: no Insomnia: yes  Fatigue: yes Feelings of worthlessness or guilt: yes Impaired concentration/indecisiveness: yes Suicidal ideations: no Hopelessness: yes Crying spells: yes Depression screen [facility] 2/9 08/15/2015 06/14/2015  Decreased Interest 2 3  Down, Depressed, Hopeless 2 2  PHQ - 2 Score 4 5  Altered sleeping 3 3  Tired, decreased energy 3 3  Change in appetite 1 2  Feeling bad or failure about yourself  2 3  Trouble concentrating 3 3  Moving slowly or fidgety/restless 2 2  Suicidal thoughts 0 0  PHQ-9 Score 18 21  Difficult doing work/chores Very difficult Somewhat difficult    CHRONIC PAIN  Present dose:  Morphine equivalents Pain control status: stable Duration: 10 months Location: L hip Quality: aching Current Pain Level: 4/10 Previous Pain Level: 8/10 Breakthrough pain: no Benefit from narcotic medications: yes What Activities task can be accomplished with current medication?: care for his granddaughter Interested in weaning off narcotics:yes   Stool softners/OTC fiber: no  Previous pain specialty evaluation: no Non-narcotic analgesic meds: no- did not tolerate  them Narcotic contract: no  HYPERTENSION Hypertension status: low today  Satisfied with current treatment? no Duration of hypertension: chronic BP monitoring frequency:  not checking BP medication side effects:  yes Medication compliance: excellent compliance Aspirin: yes Recurrent headaches: no Visual changes: no Palpitations: no Dyspnea: no Chest pain: no Lower extremity edema: no Dizzy/lightheaded: yes   Relevant past medical, surgical, family and social history reviewed and updated as indicated. Interim medical history since our last visit reviewed. Allergies and medications reviewed and updated.  Review of Systems  Constitutional: Negative.   Respiratory: Negative.   Cardiovascular: Negative.   Gastrointestinal: Negative.   Musculoskeletal: Positive for back pain, arthralgias and gait problem. Negative for myalgias, joint swelling, neck pain and neck stiffness.  Psychiatric/Behavioral: Negative.     Per HPI unless specifically indicated above     Objective:    BP 118/45 mmHg  Pulse 62  Temp(Src) 98.6 F (37 C)  Wt 174 lb 6.4 oz (79.107 kg)  SpO2 97%  Wt Readings from Last 3 Encounters:  08/15/15 174 lb 6.4 oz (79.107 kg)  06/14/15 174 lb 6.4 oz (79.107 kg)  04/25/15 177 lb (80.287 kg)    Physical Exam  Constitutional: He is oriented to person, place, and time. He appears well-developed and well-nourished. No distress.  HENT:  Head: Normocephalic and atraumatic.  Right Ear: Hearing normal.  Left Ear: Hearing normal.  Nose: Nose normal.  Eyes: Conjunctivae and lids are normal. Right eye exhibits no discharge. Left eye exhibits no discharge. No scleral icterus.  Cardiovascular: Normal rate, regular rhythm, normal heart  sounds and intact distal pulses.  Exam reveals no gallop and no friction rub.   No murmur heard. Pulmonary/Chest: Effort normal. No respiratory distress. He has wheezes. He has no rales. He exhibits no tenderness.  Musculoskeletal:   Antalgic gait, tender over L hip, atrophied L thigh  Neurological: He is alert and oriented to person, place, and time.  Skin: Skin is warm, dry and intact. No rash noted. No erythema. No pallor.  Psychiatric: He has a normal mood and affect. His speech is normal and behavior is normal. Judgment and thought content normal. Cognition and memory are normal.  Nursing note and vitals reviewed.   Results for orders placed or performed during the hospital encounter of 09/15/12  Basic metabolic panel   Result Value Ref Range   Sodium 138 135 - 145 mEq/L   Potassium 4.2 3.5 - 5.1 mEq/L   Chloride 105 96 - 112 mEq/L   CO2 26 19 - 32 mEq/L   Glucose, Bld 99 70 - 99 mg/dL   BUN 10 6 - 23 mg/dL   Creatinine, Ser 9.60 0.50 - 1.35 mg/dL   Calcium 9.0 8.4 - 45.4 mg/dL   GFR calc non Af Amer 81 (L) >90 mL/min   GFR calc Af Amer >90 >90 mL/min  CBC  Result Value Ref Range   WBC 6.8 4.0 - 10.5 K/uL   RBC 3.98 (L) 4.22 - 5.81 MIL/uL   Hemoglobin 11.7 (L) 13.0 - 17.0 g/dL   HCT 09.8 (L) 11.9 - 14.7 %   MCV 87.9 78.0 - 100.0 fL   MCH 29.4 26.0 - 34.0 pg   MCHC 33.4 30.0 - 36.0 g/dL   RDW 82.9 56.2 - 13.0 %   Platelets 160 150 - 400 K/uL  POCT Activated clotting time  Result Value Ref Range   Activated Clotting Time 169 seconds  POCT Activated clotting time  Result Value Ref Range   Activated Clotting Time 229 seconds      Assessment & Plan:   Problem List Items Addressed This Visit      Cardiovascular and Mediastinum   Hypotension - Primary    Minorly better after 4 glasses of water. Likely due to dehydration from not eating yesterday. After a bag of doritos and another 2 glasses of water BP came up to 118/45. Discussed warning signs for which to go to the ER. Liberalize salt tonight. Will decrease his lisinopril to  and recheck in 1 week.       Relevant Medications   lisinopril (PRINIVIL,ZESTRIL) 20 MG tablet     Musculoskeletal and Integument   Avascular necrosis of bone of  left hip    Strongly advised patient to call ortho and have appointment to see them and discuss THR. He is afraid of blood clots and death on the table, but leg is becoming more atrophied and he is getting worse. Pain stable on medicine, but he doesn't want to have to keep taking it and it seems to be increasing his depression.         Other   Severe recurrent major depression    Again situational. Improved from last visit. Continue current regimen and see how he does in about a month. Seems to be worse with the pain and the medication for the pain. Encouraged him to follow up with ortho for total hip replacement.       Chronic pain    Strongly advised patient to call ortho and have appointment to see them and  discuss THR. He is afraid of blood clots and death on the table, but leg is becoming more atrophied and he is getting worse. Pain stable on medicine, but he doesn't want to have to keep taking it and it seems to be increasing his depression. 2 month supply of his pain medicine given today.       Relevant Medications   oxyCODONE-acetaminophen (PERCOCET) 10-325 MG per tablet       Follow up plan: Return in about 1 week (around 08/22/2015) for Follow up on BP.

## 2015-08-15 NOTE — Assessment & Plan Note (Signed)
Strongly advised patient to call ortho and have appointment to see them and discuss THR. He is afraid of blood clots and death on the table, but leg is becoming more atrophied and he is getting worse. Pain stable on medicine, but he doesn't want to have to keep taking it and it seems to be increasing his depression.

## 2015-08-15 NOTE — Patient Instructions (Addendum)
DECREASE LISINOPRIL To  daily (only 1 Pill)Hypotension As your heart beats, it forces blood through your body. This force is called blood pressure. If you have hypotension, you have low blood pressure. When your blood pressure is too low, you may not get enough blood to your brain. You may feel weak, feel lightheaded, have a fast heartbeat, or even pass out (faint). HOME CARE  Drink enough fluids to keep your pee (urine) clear or pale yellow.  Take all medicines as told by your doctor.  Get up slowly after sitting or lying down.  Wear support stockings as told by your doctor.  Maintain a healthy diet by including foods such as fruits, vegetables, nuts, whole grains, and lean meats. GET HELP IF:  You are throwing up (vomiting) or have watery poop (diarrhea).  You have a fever for more than 2-3 days.  You feel more thirsty than usual.  You feel weak and tired. GET HELP RIGHT AWAY IF:   You pass out (faint).  You have chest pain or a fast or irregular heartbeat.  You lose feeling in part of your body.  You cannot move your arms or legs.  You have trouble speaking.  You get sweaty or feel lightheaded. MAKE SURE YOU:   Understand these instructions.  Will watch your condition.  Will get help right away if you are not doing well or get worse. Document Released: 03/03/2010 Document Revised: 08/09/2013 Document Reviewed: 06/09/2013 [facility] Patient Information 2015 [place], [place]. This information is not intended to replace advice given to you by your health care provider. Make sure you discuss any questions you have with your health care provider.

## 2015-08-15 NOTE — Assessment & Plan Note (Addendum)
Minorly better after 4 glasses of water. Likely due to dehydration from not eating yesterday. After a bag of doritos and another 2 glasses of water BP came up to 118/45. Discussed warning signs for which to go to the ER. Liberalize salt tonight. Will decrease his lisinopril to  and recheck in 1 week.

## 2015-08-22 ENCOUNTER — Encounter: Payer: Self-pay | Admitting: Family Medicine

## 2015-08-22 ENCOUNTER — Ambulatory Visit (INDEPENDENT_AMBULATORY_CARE_PROVIDER_SITE_OTHER): Payer: Medicare Other | Admitting: Family Medicine

## 2015-08-22 VITALS — BP 102/65 | HR 67 | Temp 98.6°F | Wt 177.6 lb

## 2015-08-22 DIAGNOSIS — I739 Peripheral vascular disease, unspecified: Secondary | ICD-10-CM

## 2015-08-22 DIAGNOSIS — I952 Hypotension due to drugs: Secondary | ICD-10-CM | POA: Diagnosis not present

## 2015-08-22 DIAGNOSIS — M87052 Idiopathic aseptic necrosis of left femur: Secondary | ICD-10-CM | POA: Diagnosis not present

## 2015-08-22 MED ORDER — LISINOPRIL 5 MG PO TABS: 5.0000 mg | ORAL_TABLET | Freq: Every day | ORAL | Status: DC

## 2015-08-22 NOTE — Patient Instructions (Signed)
[facility] [address]  [place], [place] [address] [phone]

## 2015-08-22 NOTE — Progress Notes (Signed)
BP 102/65 mmHg  Pulse 67  Temp(Src) 98.6 F (37 C)  Wt 177 lb 9.6 oz (80.559 kg)  SpO2 97%   Subjective:    Patient ID: Lucas Weiss, male    DOB: 1956-05-11, 59 y.o.   MRN: [id number]  HPI: Lucas Weiss is a 59 y.o. male  Chief Complaint  Patient presents with  . Hypotension   BP still running low. Taking  of lisinopril daily right now. Has been really low still. Ate a bunch of salt today. Not feeling good, feeling really tired and worn out. No CP, no SOB, continues with a lot of pain in his hip. No other concerns or complaints at this time.   Relevant past medical, surgical, family and social history reviewed and updated as indicated. Interim medical history since our last visit reviewed. Allergies and medications reviewed and updated.  Review of Systems  Constitutional: Negative.   Respiratory: Negative.   Cardiovascular: Negative.   Musculoskeletal: Positive for myalgias, arthralgias and gait problem. Negative for back pain, joint swelling, neck pain and neck stiffness.  Psychiatric/Behavioral: Positive for sleep disturbance, dysphoric mood and decreased concentration. Negative for suicidal ideas, hallucinations, behavioral problems, confusion, self-injury and agitation. The patient is nervous/anxious. The patient is not hyperactive.     Per HPI unless specifically indicated above     Objective:    BP 102/65 mmHg  Pulse 67  Temp(Src) 98.6 F (37 C)  Wt 177 lb 9.6 oz (80.559 kg)  SpO2 97%  Wt Readings from Last 3 Encounters:  08/22/15 177 lb 9.6 oz (80.559 kg)  08/15/15 174 lb 6.4 oz (79.107 kg)  06/14/15 174 lb 6.4 oz (79.107 kg)    Physical Exam  Constitutional: He is oriented to person, place, and time. He appears well-developed and well-nourished. No distress.  HENT:  Head: Normocephalic and atraumatic.  Right Ear: Hearing normal.  Left Ear: Hearing normal.  Nose: Nose normal.  Eyes: Conjunctivae and lids are normal. Right eye exhibits no discharge.  Left eye exhibits no discharge. No scleral icterus.  Cardiovascular: Normal rate, regular rhythm, normal heart sounds and intact distal pulses.  Exam reveals no gallop and no friction rub.   No murmur heard. Pulmonary/Chest: Effort normal and breath sounds normal. No respiratory distress. He has no wheezes. He has no rales. He exhibits no tenderness.  Neurological: He is alert and oriented to person, place, and time.  Skin: Skin is warm, dry and intact. No rash noted. No erythema. No pallor.  Psychiatric: He has a normal mood and affect. His speech is normal and behavior is normal. Judgment and thought content normal. Cognition and memory are normal.  Nursing note and vitals reviewed.   Results for orders placed or performed during the hospital encounter of 09/15/12  Basic metabolic panel   Result Value Ref Range   Sodium 138 135 - 145 mEq/L   Potassium 4.2 3.5 - 5.1 mEq/L   Chloride 105 96 - 112 mEq/L   CO2 26 19 - 32 mEq/L   Glucose, Bld 99 70 - 99 mg/dL   BUN 10 6 - 23 mg/dL   Creatinine, Ser 4.09 0.50 - 1.35 mg/dL   Calcium 9.0 8.4 - 81.1 mg/dL   GFR calc non Af Amer 81 (L) >90 mL/min   GFR calc Af Amer >90 >90 mL/min  CBC  Result Value Ref Range   WBC 6.8 4.0 - 10.5 K/uL   RBC 3.98 (L) 4.22 - 5.81 MIL/uL   Hemoglobin 11.7 (L)  13.0 - 17.0 g/dL   HCT 86.5 (L) 78.4 - 69.6 %   MCV 87.9 78.0 - 100.0 fL   MCH 29.4 26.0 - 34.0 pg   MCHC 33.4 30.0 - 36.0 g/dL   RDW 29.5 28.4 - 13.2 %   Platelets 160 150 - 400 K/uL  POCT Activated clotting time  Result Value Ref Range   Activated Clotting Time 169 seconds  POCT Activated clotting time  Result Value Ref Range   Activated Clotting Time 229 seconds      Assessment & Plan:   Problem List Items Addressed This Visit      Cardiovascular and Mediastinum   Hypotension - Primary    BP low despite eating salt and having his meds cut 1 week ago. Will decrease from 20mg  lisinopril to 5mg  lisinopril and recheck in 2 weeks. He will  monitor at home and call if staying <100/80 or >140/90 and we will adjust over the phone. Continue to liberalize salt right now. Continue to monitor.       Relevant Medications   lisinopril (PRINIVIL,ZESTRIL) 5 MG tablet     Musculoskeletal and Integument   Avascular necrosis of bone of left hip    Still hasn't seen ortho. Pain and depression worsening due to inability to do anything and being on pain meds. New referral put in today with [place] [facility], who he has seen previously. Vascular surgery said that he was fine to move forward with his THR without stenting as that is what was bothering him more. Encouraged patient to follow up with them as this may make several things better for him.       Relevant Orders   Ambulatory referral to Orthopedic Surgery       Follow up plan: Return in about 2 weeks (around 09/05/2015).

## 2015-08-22 NOTE — Assessment & Plan Note (Signed)
Still hasn't seen ortho. Pain and depression worsening due to inability to do anything and being on pain meds. New referral put in today with [facility], who he has seen previously. Vascular surgery said that he was fine to move forward with his THR without stenting as that is what was bothering him more. Encouraged patient to follow up with them as this may make several things better for him.

## 2015-08-22 NOTE — Assessment & Plan Note (Signed)
BP low despite eating salt and having his meds cut 1 week ago. Will decrease from  lisinopril to  lisinopril and recheck in 2 weeks. He will monitor at home and call if staying <100/80 or >140/90 and we will adjust over the phone. Continue to liberalize salt right now. Continue to monitor.

## 2015-09-06 ENCOUNTER — Encounter: Payer: Self-pay | Admitting: Family Medicine

## 2015-09-06 ENCOUNTER — Ambulatory Visit (INDEPENDENT_AMBULATORY_CARE_PROVIDER_SITE_OTHER): Payer: Medicare Other | Admitting: Family Medicine

## 2015-09-06 VITALS — BP 136/77 | HR 67 | Temp 98.7°F | Wt 177.0 lb

## 2015-09-06 DIAGNOSIS — Z1211 Encounter for screening for malignant neoplasm of colon: Secondary | ICD-10-CM | POA: Diagnosis not present

## 2015-09-06 DIAGNOSIS — I739 Peripheral vascular disease, unspecified: Secondary | ICD-10-CM

## 2015-09-06 DIAGNOSIS — Z23 Encounter for immunization: Secondary | ICD-10-CM

## 2015-09-06 DIAGNOSIS — I952 Hypotension due to drugs: Secondary | ICD-10-CM

## 2015-09-06 NOTE — Assessment & Plan Note (Signed)
Blood pressure stable on lisinopril . He is still feeling tired and has little energy.

## 2015-09-06 NOTE — Progress Notes (Signed)
BP 136/77 mmHg  Pulse 67  Temp(Src) 98.7 F (37.1 C)  Wt 177 lb (80.287 kg)  SpO2 97%   Subjective:    Patient ID: Lucas Weiss, male    DOB: Oct 25, 1956, 59 y.o.   MRN: [id number]  HPI: Lucas Weiss is a 59 y.o. male  Chief Complaint  Patient presents with  . Hypertension   He is stable on Lisinopril  daily. Blood pressure 136/77. He continues to tire easily and still depressed. Denies SOB, chest pain, dizziness or headaches. He has not seen ortho yet and continues to have pain in the left hip. He uses a cane to assist with walking.   Relevant past medical, surgical, family and social history reviewed and updated as indicated. Interim medical history since our last visit reviewed. Allergies and medications reviewed and updated.  Review of Systems  Constitutional: Negative for activity change and appetite change.  HENT: Negative.  Negative for congestion, rhinorrhea, sinus pressure, sneezing and sore throat.   Eyes: Negative.  Negative for pain, discharge and itching.  Respiratory: Negative.  Negative for cough, chest tightness, shortness of breath, wheezing and stridor.   Cardiovascular: Negative.  Negative for chest pain, palpitations and leg swelling.  Gastrointestinal: Negative.  Negative for nausea, diarrhea and constipation.  Genitourinary: Negative.  Negative for urgency and frequency.  Musculoskeletal: Positive for myalgias, arthralgias and gait problem.  Skin: Negative.  Negative for color change, pallor, rash and wound.  Psychiatric/Behavioral: Positive for sleep disturbance and decreased concentration. The patient is nervous/anxious.     Per HPI unless specifically indicated above     Objective:    BP 136/77 mmHg  Pulse 67  Temp(Src) 98.7 F (37.1 C)  Wt 177 lb (80.287 kg)  SpO2 97%  Wt Readings from Last 3 Encounters:  09/06/15 177 lb (80.287 kg)  08/22/15 177 lb 9.6 oz (80.559 kg)  08/15/15 174 lb 6.4 oz (79.107 kg)    Physical Exam  Constitutional:  He is oriented to person, place, and time. He appears well-developed and well-nourished. No distress.  HENT:  Head: Normocephalic and atraumatic.  Neck: Normal range of motion.  Cardiovascular: Normal rate, regular rhythm and normal heart sounds.   Pulmonary/Chest: Effort normal and breath sounds normal. No respiratory distress. He has no wheezes. He has no rales. He exhibits no tenderness.  Neurological: He is alert and oriented to person, place, and time.  Skin: Skin is warm and dry. No rash noted. He is not diaphoretic. No erythema. No pallor.  Psychiatric: His behavior is normal. Judgment and thought content normal.        Assessment & Plan:   Problem List Items Addressed This Visit      Cardiovascular and Mediastinum   Hypotension - Primary    Blood pressure stable on lisinopril . He is still feeling tired and has little energy.       Other Visit Diagnoses    Immunization due        Relevant Orders    Flu Vaccine QUAD 36+ mos PF IM (Fluarix & Fluzone Quad PF) (Completed)    Colon cancer screening        Relevant Orders    Ambulatory referral to General Surgery       Encouraged to make appointment with orthopedics regarding left hip. Will recheck blood pressure at next visit.   Follow up plan: Return in about 4 weeks (around 10/04/2015).

## 2015-10-08 ENCOUNTER — Ambulatory Visit (INDEPENDENT_AMBULATORY_CARE_PROVIDER_SITE_OTHER): Payer: Medicare Other | Admitting: Family Medicine

## 2015-10-08 ENCOUNTER — Encounter: Payer: Self-pay | Admitting: Family Medicine

## 2015-10-08 VITALS — BP 126/58 | HR 75 | Temp 98.5°F | Ht 68.4 in | Wt 174.0 lb

## 2015-10-08 DIAGNOSIS — M87052 Idiopathic aseptic necrosis of left femur: Secondary | ICD-10-CM

## 2015-10-08 DIAGNOSIS — I70219 Atherosclerosis of native arteries of extremities with intermittent claudication, unspecified extremity: Secondary | ICD-10-CM

## 2015-10-08 DIAGNOSIS — I739 Peripheral vascular disease, unspecified: Secondary | ICD-10-CM | POA: Diagnosis not present

## 2015-10-08 DIAGNOSIS — R5382 Chronic fatigue, unspecified: Secondary | ICD-10-CM | POA: Diagnosis not present

## 2015-10-08 DIAGNOSIS — Z951 Presence of aortocoronary bypass graft: Secondary | ICD-10-CM | POA: Diagnosis not present

## 2015-10-08 DIAGNOSIS — F329 Major depressive disorder, single episode, unspecified: Secondary | ICD-10-CM

## 2015-10-08 DIAGNOSIS — R7301 Impaired fasting glucose: Secondary | ICD-10-CM

## 2015-10-08 DIAGNOSIS — G8929 Other chronic pain: Secondary | ICD-10-CM | POA: Diagnosis not present

## 2015-10-08 DIAGNOSIS — Z113 Encounter for screening for infections with a predominantly sexual mode of transmission: Secondary | ICD-10-CM | POA: Diagnosis not present

## 2015-10-08 DIAGNOSIS — Z206 Contact with and (suspected) exposure to human immunodeficiency virus [HIV]: Secondary | ICD-10-CM

## 2015-10-08 DIAGNOSIS — F32A Depression, unspecified: Secondary | ICD-10-CM

## 2015-10-08 DIAGNOSIS — F332 Major depressive disorder, recurrent severe without psychotic features: Secondary | ICD-10-CM

## 2015-10-08 DIAGNOSIS — I952 Hypotension due to drugs: Secondary | ICD-10-CM | POA: Diagnosis not present

## 2015-10-08 DIAGNOSIS — Z114 Encounter for screening for human immunodeficiency virus [HIV]: Secondary | ICD-10-CM | POA: Diagnosis not present

## 2015-10-08 LAB — BAYER DCA HB A1C WAIVED: HB A1C: 5.7 % (ref <7.0)

## 2015-10-08 MED ORDER — OXYCODONE-ACETAMINOPHEN 10-325 MG PO TABS: 1.0000 | ORAL_TABLET | Freq: Four times a day (QID) | ORAL | Status: DC | PRN

## 2015-10-08 MED ORDER — CLOPIDOGREL BISULFATE 75 MG PO TABS: 75.0000 mg | ORAL_TABLET | Freq: Every day | ORAL | Status: DC

## 2015-10-08 NOTE — Assessment & Plan Note (Signed)
Doing a bit better. Still a lot of social issues going on that prevent remission. Continue current regimen. Checking TSH. Continue to monitor.

## 2015-10-08 NOTE — Assessment & Plan Note (Signed)
Still hasn't seen ortho. Pain and depression worsening due to inability to do anything and being on pain meds. New referral put in previously with [facility], who he has seen previously. Vascular surgery said that he was fine to move forward with his THR without stenting as that is what was bothering him more. Encouraged patient to follow up with them as this may make several things better for him. Now wanting to hold off due to court dates.

## 2015-10-08 NOTE — Assessment & Plan Note (Signed)
Plavix refilled today. Continue to follow with vascular surgery. Continue to monitor.

## 2015-10-08 NOTE — Assessment & Plan Note (Signed)
BP doing better. Continue current regimen. Continue to monitor.
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
## 2015-10-08 NOTE — Assessment & Plan Note (Signed)
Strongly advised patient and his sister again to call ortho and have appointment to see them and discuss THR. He is afraid of blood clots and death on the table, but leg is becoming more atrophied and he is getting worse. Pain stable on medicine, but he doesn't want to have to keep taking it and it seems to be increasing his depression. 3 month supply of his pain medicine given today.

## 2015-10-09 ENCOUNTER — Telehealth: Payer: Self-pay | Admitting: Family Medicine

## 2015-10-09 LAB — HIV ANTIBODY (ROUTINE TESTING W REFLEX): HIV Screen 4th Generation wRfx: NONREACTIVE

## 2015-10-09 LAB — THYROID PANEL WITH TSH
FREE THYROXINE INDEX: 2.2 (ref 1.2–4.9)
T3 Uptake Ratio: 25 % (ref 24–39)
T4 TOTAL: 8.9 ug/dL (ref 4.5–12.0)
TSH: 0.678 u[IU]/mL (ref 0.450–4.500)

## 2015-10-09 LAB — HEPATITIS C ANTIBODY

## 2015-10-09 NOTE — Telephone Encounter (Signed)
Patient notified

## 2015-10-09 NOTE — Telephone Encounter (Signed)
Please let Lucas Weiss know that his labs came back normal. No problem with his thyroid and everything else was negative. THanks!

## 2015-10-31 ENCOUNTER — Encounter (HOSPITAL_COMMUNITY): Payer: Medicare Other

## 2015-10-31 ENCOUNTER — Ambulatory Visit: Payer: Medicare Other | Admitting: Vascular Surgery

## 2015-11-05 ENCOUNTER — Telehealth: Payer: Self-pay | Admitting: Family Medicine

## 2015-11-05 NOTE — Telephone Encounter (Signed)
Pt came in stated he needs refill on his antidepressants. Pharm is [facility] on [address]. Thanks.

## 2015-11-05 NOTE — Telephone Encounter (Signed)
Tried to call and speak with the patient to confirm that he needs a refill, he should have enough.

## 2015-11-11 ENCOUNTER — Encounter: Payer: Self-pay | Admitting: Emergency Medicine

## 2015-11-11 ENCOUNTER — Emergency Department: Payer: No Typology Code available for payment source

## 2015-11-11 ENCOUNTER — Emergency Department
Admission: EM | Admit: 2015-11-11 | Discharge: 2015-11-11 | Disposition: A | Discharge status: Home / Self Care | Payer: No Typology Code available for payment source | Attending: Emergency Medicine | Admitting: Emergency Medicine

## 2015-11-11 DIAGNOSIS — Z79899 Other long term (current) drug therapy: Secondary | ICD-10-CM | POA: Diagnosis not present

## 2015-11-11 DIAGNOSIS — S8992XA Unspecified injury of left lower leg, initial encounter: Secondary | ICD-10-CM | POA: Diagnosis not present

## 2015-11-11 DIAGNOSIS — I10 Essential (primary) hypertension: Secondary | ICD-10-CM | POA: Insufficient documentation

## 2015-11-11 DIAGNOSIS — Z87891 Personal history of nicotine dependence: Secondary | ICD-10-CM | POA: Diagnosis not present

## 2015-11-11 DIAGNOSIS — Y92410 Unspecified street and highway as the place of occurrence of the external cause: Secondary | ICD-10-CM | POA: Diagnosis not present

## 2015-11-11 DIAGNOSIS — M545 Low back pain: Secondary | ICD-10-CM | POA: Diagnosis not present

## 2015-11-11 DIAGNOSIS — Y998 Other external cause status: Secondary | ICD-10-CM | POA: Diagnosis not present

## 2015-11-11 DIAGNOSIS — S3993XA Unspecified injury of pelvis, initial encounter: Secondary | ICD-10-CM | POA: Insufficient documentation

## 2015-11-11 DIAGNOSIS — M25552 Pain in left hip: Secondary | ICD-10-CM | POA: Diagnosis not present

## 2015-11-11 DIAGNOSIS — Y9389 Activity, other specified: Secondary | ICD-10-CM | POA: Diagnosis not present

## 2015-11-11 DIAGNOSIS — S79912A Unspecified injury of left hip, initial encounter: Secondary | ICD-10-CM | POA: Diagnosis not present

## 2015-11-11 MED ORDER — CYCLOBENZAPRINE HCL 10 MG PO TABS
10.0000 mg | ORAL_TABLET | Freq: Once | ORAL | Status: AC
  Administered 2015-11-11: 10 mg via ORAL
  Filled 2015-11-11: qty 1

## 2015-11-11 MED ORDER — OXYCODONE-ACETAMINOPHEN 5-325 MG PO TABS
2.0000 | ORAL_TABLET | Freq: Once | ORAL | Status: AC
  Administered 2015-11-11: 2 via ORAL
  Filled 2015-11-11: qty 2

## 2015-11-11 MED ORDER — CYCLOBENZAPRINE HCL 10 MG PO TABS: 10.0000 mg | ORAL_TABLET | Freq: Three times a day (TID) | ORAL | Status: DC | PRN
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
## 2015-11-11 NOTE — Discharge Instructions (Signed)
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
## 2015-11-12 ENCOUNTER — Telehealth: Payer: Self-pay

## 2015-11-12 ENCOUNTER — Other Ambulatory Visit: Payer: Self-pay | Admitting: Family Medicine

## 2015-11-12 MED ORDER — CITALOPRAM HYDROBROMIDE 40 MG PO TABS: 60.0000 mg | ORAL_TABLET | Freq: Every day | ORAL | Status: DC

## 2015-11-12 NOTE — Telephone Encounter (Signed)
Patient notified, he does not need another refill right now.
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
## 2015-11-13 ENCOUNTER — Ambulatory Visit: Payer: Medicare Other | Admitting: Unknown Physician Specialty

## 2015-11-20 ENCOUNTER — Encounter: Payer: Self-pay | Admitting: Vascular Surgery

## 2015-11-21 ENCOUNTER — Ambulatory Visit (INDEPENDENT_AMBULATORY_CARE_PROVIDER_SITE_OTHER): Payer: Medicare Other | Admitting: Vascular Surgery

## 2015-11-21 ENCOUNTER — Encounter: Payer: Self-pay | Admitting: Vascular Surgery

## 2015-11-21 ENCOUNTER — Ambulatory Visit (HOSPITAL_COMMUNITY)
Admission: RE | Admit: 2015-11-21 | Discharge: 2015-11-21 | Disposition: A | Discharge status: Home / Self Care | Payer: Medicare Other | Source: Ambulatory Visit | Attending: Vascular Surgery | Admitting: Vascular Surgery

## 2015-11-21 VITALS — BP 145/82 | HR 84 | Ht 71.0 in | Wt 185.5 lb

## 2015-11-21 DIAGNOSIS — I10 Essential (primary) hypertension: Secondary | ICD-10-CM | POA: Diagnosis not present

## 2015-11-21 DIAGNOSIS — E7800 Pure hypercholesterolemia, unspecified: Secondary | ICD-10-CM | POA: Diagnosis not present

## 2015-11-21 DIAGNOSIS — I739 Peripheral vascular disease, unspecified: Secondary | ICD-10-CM | POA: Insufficient documentation

## 2015-11-21 NOTE — Addendum Note (Signed)
Addended by: Adria DillELDRIDGE-LEWIS, Kyani Simkin L on: 11/21/2015 05:00 PM   Modules accepted: Orders
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
## 2015-12-03 ENCOUNTER — Telehealth: Payer: Self-pay | Admitting: Family Medicine

## 2015-12-03 ENCOUNTER — Ambulatory Visit: Payer: Medicare Other | Admitting: Family Medicine

## 2015-12-03 DIAGNOSIS — M545 Low back pain: Secondary | ICD-10-CM

## 2015-12-03 NOTE — Telephone Encounter (Signed)
OK to get x-ray. Order put in today. Should see orthopedics.

## 2015-12-03 NOTE — Telephone Encounter (Signed)
[facility]

## 2015-12-04 ENCOUNTER — Ambulatory Visit
Admission: RE | Admit: 2015-12-04 | Discharge: 2015-12-04 | Disposition: A | Discharge status: Home / Self Care | Payer: Medicare Other | Source: Ambulatory Visit | Attending: Family Medicine | Admitting: Family Medicine

## 2015-12-04 ENCOUNTER — Telehealth: Payer: Self-pay | Admitting: Family Medicine

## 2015-12-04 DIAGNOSIS — S3992XA Unspecified injury of lower back, initial encounter: Secondary | ICD-10-CM | POA: Diagnosis not present

## 2015-12-04 DIAGNOSIS — M545 Low back pain: Secondary | ICD-10-CM

## 2015-12-04 NOTE — Telephone Encounter (Signed)
Please let Viviann SpareSteven know that his x-ray came back with some mild arthritis, but nothing else. Thanks!

## 2015-12-04 NOTE — Telephone Encounter (Signed)
Patient notified

## 2016-01-09 ENCOUNTER — Other Ambulatory Visit: Payer: Self-pay | Admitting: Family Medicine

## 2016-01-09 ENCOUNTER — Ambulatory Visit (INDEPENDENT_AMBULATORY_CARE_PROVIDER_SITE_OTHER): Payer: Medicare Other | Admitting: Family Medicine

## 2016-01-09 ENCOUNTER — Encounter: Payer: Self-pay | Admitting: Family Medicine

## 2016-01-09 VITALS — BP 148/70 | HR 71 | Temp 98.0°F | Ht 68.1 in | Wt 180.0 lb

## 2016-01-09 DIAGNOSIS — R7301 Impaired fasting glucose: Secondary | ICD-10-CM

## 2016-01-09 DIAGNOSIS — E119 Type 2 diabetes mellitus without complications: Secondary | ICD-10-CM

## 2016-01-09 DIAGNOSIS — F17201 Nicotine dependence, unspecified, in remission: Secondary | ICD-10-CM | POA: Insufficient documentation

## 2016-01-09 DIAGNOSIS — E785 Hyperlipidemia, unspecified: Secondary | ICD-10-CM

## 2016-01-09 DIAGNOSIS — G8929 Other chronic pain: Secondary | ICD-10-CM | POA: Diagnosis not present

## 2016-01-09 DIAGNOSIS — I110 Hypertensive heart disease with heart failure: Secondary | ICD-10-CM | POA: Diagnosis not present

## 2016-01-09 DIAGNOSIS — M545 Low back pain: Secondary | ICD-10-CM

## 2016-01-09 DIAGNOSIS — Z0000 Encounter for general adult medical examination without abnormal findings: Secondary | ICD-10-CM

## 2016-01-09 DIAGNOSIS — I952 Hypotension due to drugs: Secondary | ICD-10-CM | POA: Diagnosis not present

## 2016-01-09 DIAGNOSIS — Z125 Encounter for screening for malignant neoplasm of prostate: Secondary | ICD-10-CM

## 2016-01-09 DIAGNOSIS — F332 Major depressive disorder, recurrent severe without psychotic features: Secondary | ICD-10-CM | POA: Diagnosis not present

## 2016-01-09 DIAGNOSIS — R3911 Hesitancy of micturition: Secondary | ICD-10-CM | POA: Diagnosis not present

## 2016-01-09 DIAGNOSIS — Z1211 Encounter for screening for malignant neoplasm of colon: Secondary | ICD-10-CM | POA: Diagnosis not present

## 2016-01-09 DIAGNOSIS — M87052 Idiopathic aseptic necrosis of left femur: Secondary | ICD-10-CM

## 2016-01-09 DIAGNOSIS — E1129 Type 2 diabetes mellitus with other diabetic kidney complication: Secondary | ICD-10-CM | POA: Insufficient documentation

## 2016-01-09 DIAGNOSIS — E1165 Type 2 diabetes mellitus with hyperglycemia: Secondary | ICD-10-CM | POA: Insufficient documentation

## 2016-01-09 HISTORY — DX: Type 2 diabetes mellitus without complications: E11.9

## 2016-01-09 LAB — UA/M W/RFLX CULTURE, ROUTINE
Bilirubin, UA: NEGATIVE
GLUCOSE, UA: NEGATIVE
KETONES UA: NEGATIVE
LEUKOCYTES UA: NEGATIVE
Nitrite, UA: NEGATIVE
RBC, UA: NEGATIVE
Specific Gravity, UA: 1.02 (ref 1.005–1.030)
Urobilinogen, Ur: 1 mg/dL (ref 0.2–1.0)
pH, UA: 5.5 (ref 5.0–7.5)

## 2016-01-09 LAB — BAYER DCA HB A1C WAIVED: HB A1C (BAYER DCA - WAIVED): 6.8 % (ref <7.0)

## 2016-01-09 LAB — MICROALBUMIN, URINE WAIVED
CREATININE, URINE WAIVED: 300 mg/dL (ref 10–300)
Microalb, Ur Waived: 80 mg/L — ABNORMAL HIGH (ref 0–19)

## 2016-01-09 MED ORDER — DOCUSATE SODIUM 100 MG PO CAPS: 100.0000 mg | ORAL_CAPSULE | Freq: Two times a day (BID) | ORAL | Status: DC

## 2016-01-09 MED ORDER — OXYCODONE-ACETAMINOPHEN 10-325 MG PO TABS: 1.0000 | ORAL_TABLET | Freq: Four times a day (QID) | ORAL | Status: DC | PRN

## 2016-01-09 MED ORDER — NITROGLYCERIN 0.4 MG SL SUBL: 0.4000 mg | SUBLINGUAL_TABLET | SUBLINGUAL | Status: DC | PRN

## 2016-01-09 MED ORDER — METOPROLOL TARTRATE 25 MG PO TABS: ORAL_TABLET | ORAL | Status: DC

## 2016-01-09 MED ORDER — CYCLOBENZAPRINE HCL 10 MG PO TABS: 10.0000 mg | ORAL_TABLET | Freq: Three times a day (TID) | ORAL | Status: DC | PRN

## 2016-01-09 MED ORDER — LOVASTATIN 20 MG PO TABS
40.0000 mg | ORAL_TABLET | Freq: Every day | ORAL | Status: DC
Start: 2016-01-09 — End: 2016-07-14

## 2016-01-09 MED ORDER — BUPROPION HCL ER (SR) 150 MG PO TB12: ORAL_TABLET | ORAL | Status: DC

## 2016-01-09 NOTE — Assessment & Plan Note (Signed)
UA negative for blood. Continue to refrain from cigarettes.

## 2016-01-09 NOTE — Addendum Note (Signed)
Addended by: Dorcas Carrow on: 01/09/2016 03:09 PM   Modules accepted: Kipp Brood

## 2016-01-09 NOTE — Assessment & Plan Note (Addendum)
Appointment made for him to see orthopedics on 01/27/16 at 9:30AM for him to discuss THR and back pain. He is afraid of blood clots and death on the table, but leg is becoming more atrophied and he is getting worse. Pain stable on medicine, but he doesn't want to have to keep taking it and it seems to be increasing his depression. Now with constipation. Colace sent to his pharmacy. 1 month of pain medicine given today.

## 2016-01-09 NOTE — Assessment & Plan Note (Signed)
Continue to monitor BP. Continue to follow closely

## 2016-01-09 NOTE — Assessment & Plan Note (Signed)
Worse today. Still with a lot of social issues. Will try adding wellbutrin to see if that helps get him under better control. Continue to monitor closely and recheck in 1 month.

## 2016-01-09 NOTE — Assessment & Plan Note (Signed)
Following with vascular. Stable. Continue to follow with vascular.

## 2016-01-09 NOTE — Assessment & Plan Note (Addendum)
Improved since coming off some medicines. Now with the opposite problem. Continue to monitor closely.

## 2016-01-09 NOTE — Assessment & Plan Note (Addendum)
A1c 6.2 last time. Checking again today.A1c up to 6.8, giving him a diagnosis of DM. No need for medication at this time. Will [facility] for [facility] and education. No need for testing at this time. Referral to opthalmology for diabetic eye exam. Foot exam done today. Remainder of his labs done today. Recheck A1c in 3 months.

## 2016-01-09 NOTE — Assessment & Plan Note (Signed)
Rechecking levels today. Will adjust medication as needed. Continue to monitor.

## 2016-01-09 NOTE — Assessment & Plan Note (Addendum)
Still hasn't seen ortho. Pain and depression worsening due to inability to do anything and being on pain meds. Appointment made by our office today for him to see orthopedics on 01/27/16. Vascular surgery said that he was fine to move forward with his THR without stenting as that is what was bothering him more. Encouraged patient to follow up with them as this may make several things better for him. He states that he will go.

## 2016-01-09 NOTE — Assessment & Plan Note (Addendum)
BP elevated again. Has only been taking his metoprolol in the AM. Will have him take it in the PM as well and recheck BP in 1 month. If still high, will increase lisinopril again.

## 2016-01-09 NOTE — Assessment & Plan Note (Signed)
Stable at this time. Continue to monitor BP and cholesterol. Continue current regimen.

## 2016-01-09 NOTE — Addendum Note (Signed)
Addended by: Dorcas Carrow on: 01/09/2016 03:12 PM   Modules accepted: Kipp Brood

## 2016-01-09 NOTE — Assessment & Plan Note (Signed)
Following with vascular. Stable. Continue to follow with vascular.  
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
## 2016-01-09 NOTE — Patient Instructions (Addendum)
You are seeing the bone doctor for your back and your hip on 01/27/16 at 9:30 AM- you have the paper with the address.   You need to take your metoprolol 2x a day- 1 in the morning 1/2 at bedtime. Take your lisinopril just in the morning.   We are adding a new medicine to help your depression. Take it 2x a day. Keep taking the citalopram too.   You have diabetes now. We don't need to do any medicine yet. You will get calls from:  1. The lifestyle center- to learn about diabetes and what to eat and what not to eat  2. The eye doctor to have your eyes checked to make sure your diabetes is not causing eye damage.   You need to follow the instructions on the stool cards and send them back so we can make sure you don't have any blood in your stool.   The rest of your medicines are at the pharmacy waiting for you. Take the muscle relaxer as you need it (no more than 3x a day) and don't drive on it. Your pain medicine was given to you today.  I will see you in 1 month. Give Destiny a big hug.   Health Maintenance, Male A healthy lifestyle and preventative care can promote health and wellness.  Maintain regular health, dental, and eye exams.  Eat a healthy diet. Foods like vegetables, fruits, whole grains, low-fat dairy products, and lean protein foods contain the nutrients you need and are low in calories. Decrease your intake of foods high in solid fats, added sugars, and salt. Get information about a proper diet from your health care provider, if necessary.  Regular physical exercise is one of the most important things you can do for your health. Most adults should get at least 150 minutes of moderate-intensity exercise (any activity that increases your heart rate and causes you to sweat) each week. In addition, most adults need muscle-strengthening exercises on 2 or more days a week.   Maintain a healthy weight. The body mass index (BMI) is a screening tool to identify possible weight problems.  It provides an estimate of body fat based on height and weight. Your health care provider can find your BMI and can help you achieve or maintain a healthy weight. For males 20 years and older:  A BMI below 18.5 is considered underweight.  A BMI of 18.5 to 24.9 is normal.  A BMI of 25 to 29.9 is considered overweight.  A BMI of 30 and above is considered obese.  Maintain normal blood lipids and cholesterol by exercising and minimizing your intake of saturated fat. Eat a balanced diet with plenty of fruits and vegetables. Blood tests for lipids and cholesterol should begin at age 73 and be repeated every 5 years. If your lipid or cholesterol levels are high, you are over age 52, or you are at high risk for heart disease, you may need your cholesterol levels checked more frequently.Ongoing high lipid and cholesterol levels should be treated with medicines if diet and exercise are not working.  If you smoke, find out from your health care provider how to quit. If you do not use tobacco, do not start.  Lung cancer screening is recommended for adults aged 55-80 years who are at high risk for developing lung cancer because of a history of smoking. A yearly low-dose CT scan of the lungs is recommended for people who have at least a 30-pack-year history of smoking  and are current smokers or have quit within the past 15 years. A pack year of smoking is smoking an average of 1 pack of cigarettes a day for 1 year (for example, a 30-pack-year history of smoking could mean smoking 1 pack a day for 30 years or 2 packs a day for 15 years). Yearly screening should continue until the smoker has stopped smoking for at least 15 years. Yearly screening should be stopped for people who develop a health problem that would prevent them from having lung cancer treatment.  If you choose to drink alcohol, do not have more than 2 drinks per day. One drink is considered to be 12 oz (360 mL) of beer, 5 oz (150 mL) of wine, or  1.5 oz (45 mL) of liquor.  Avoid the use of street drugs. Do not share needles with anyone. Ask for help if you need support or instructions about stopping the use of drugs.  High blood pressure causes heart disease and increases the risk of stroke. High blood pressure is more likely to develop in:  People who have blood pressure in the end of the normal range (100-139/85-89 mm Hg).  People who are overweight or obese.  People who are African American.  If you are 61-93 years of age, have your blood pressure checked every 3-5 years. If you are 36 years of age or older, have your blood pressure checked every year. You should have your blood pressure measured twice--once when you are at a hospital or clinic, and once when you are not at a hospital or clinic. Record the average of the two measurements. To check your blood pressure when you are not at a hospital or clinic, you can use:  An automated blood pressure machine at a pharmacy.  A home blood pressure monitor.  If you are 19-79 years old, ask your health care provider if you should take aspirin to prevent heart disease.  Diabetes screening involves taking a blood sample to check your fasting blood sugar level. This should be done once every 3 years after age 80 if you are at a normal weight and without risk factors for diabetes. Testing should be considered at a younger age or be carried out more frequently if you are overweight and have at least 1 risk factor for diabetes.  Colorectal cancer can be detected and often prevented. Most routine colorectal cancer screening begins at the age of 51 and continues through age 31. However, your health care provider may recommend screening at an earlier age if you have risk factors for colon cancer. On a yearly basis, your health care provider may provide home test kits to check for hidden blood in the stool. A small camera at the end of a tube may be used to directly examine the colon (sigmoidoscopy  or colonoscopy) to detect the earliest forms of colorectal cancer. Talk to your health care provider about this at age 51 when routine screening begins. A direct exam of the colon should be repeated every 5-10 years through age 33, unless early forms of precancerous polyps or small growths are found.  People who are at an increased risk for hepatitis B should be screened for this virus. You are considered at high risk for hepatitis B if:  You were born in a country where hepatitis B occurs often. Talk with your health care provider about which countries are considered high risk.  Your parents were born in a high-risk country and you have not received  a shot to protect against hepatitis B (hepatitis B vaccine).  You have HIV or AIDS.  You use needles to inject street drugs.  You live with, or have sex with, someone who has hepatitis B.  You are a man who has sex with other men (MSM).  You get hemodialysis treatment.  You take certain medicines for conditions like cancer, organ transplantation, and autoimmune conditions.  Hepatitis C blood testing is recommended for all people born from [birth year] through 1963 and any individual with known risk factors for hepatitis C.  Healthy men should no longer receive prostate-specific antigen (PSA) blood tests as part of routine cancer screening. Talk to your health care provider about prostate cancer screening.  Testicular cancer screening is not recommended for adolescents or adult males who have no symptoms. Screening includes self-exam, a health care provider exam, and other screening tests. Consult with your health care provider about any symptoms you have or any concerns you have about testicular cancer.  Practice safe sex. Use condoms and avoid high-risk sexual practices to reduce the spread of sexually transmitted infections (STIs).  You should be screened for STIs, including gonorrhea and chlamydia if:  You are sexually active and are younger  than 24 years.  You are older than 24 years, and your health care provider tells you that you are at risk for this type of infection.  Your sexual activity has changed since you were last screened, and you are at an increased risk for chlamydia or gonorrhea. Ask your health care provider if you are at risk.  If you are at risk of being infected with HIV, it is recommended that you take a prescription medicine daily to prevent HIV infection. This is called pre-exposure prophylaxis (PrEP). You are considered at risk if:  You are a man who has sex with other men (MSM).  You are a heterosexual man who is sexually active with multiple partners.  You take drugs by injection.  You are sexually active with a partner who has HIV.  Talk with your health care provider about whether you are at high risk of being infected with HIV. If you choose to begin PrEP, you should first be tested for HIV. You should then be tested every 3 months for as long as you are taking PrEP.  Use sunscreen. Apply sunscreen liberally and repeatedly throughout the day. You should seek shade when your shadow is shorter than you. Protect yourself by wearing long sleeves, pants, a wide-brimmed hat, and sunglasses year round whenever you are outdoors.  Tell your health care provider of new moles or changes in moles, especially if there is a change in shape or color. Also, tell your health care provider if a mole is larger than the size of a pencil eraser.  A one-time screening for abdominal aortic aneurysm (AAA) and surgical repair of large AAAs by ultrasound is recommended for men aged 65-75 years who are current or former smokers.  Stay current with your vaccines (immunizations).   This information is not intended to replace advice given to you by your health care provider. Make sure you discuss any questions you have with your health care provider.   Document Released: 06/04/2008 Document Revised: 12/28/2014 Document  Reviewed: 05/04/2011 Elsevier Interactive Patient Education 2016 [employer]. Diabetes Mellitus and Food It is important for you to manage your blood sugar (glucose) level. Your blood glucose level can be greatly affected by what you eat. Eating healthier foods in the appropriate amounts throughout the  day at about the same time each day will help you control your blood glucose level. It can also help slow or prevent worsening of your diabetes mellitus. Healthy eating may even help you improve the level of your blood pressure and reach or maintain a healthy weight.  General recommendations for healthful eating and cooking habits include:  Eating meals and snacks regularly. Avoid going long periods of time without eating to lose weight.  Eating a diet that consists mainly of plant-based foods, such as fruits, vegetables, nuts, legumes, and whole grains.  Using low-heat cooking methods, such as baking, instead of high-heat cooking methods, such as deep frying. Work with your dietitian to make sure you understand how to use the Nutrition Facts information on food labels. HOW CAN FOOD AFFECT ME? Carbohydrates Carbohydrates affect your blood glucose level more than any other type of food. Your dietitian will help you determine how many carbohydrates to eat at each meal and teach you how to count carbohydrates. Counting carbohydrates is important to keep your blood glucose at a healthy level, especially if you are using insulin or taking certain medicines for diabetes mellitus. Alcohol Alcohol can cause sudden decreases in blood glucose (hypoglycemia), especially if you use insulin or take certain medicines for diabetes mellitus. Hypoglycemia can be a life-threatening condition. Symptoms of hypoglycemia (sleepiness, dizziness, and disorientation) are similar to symptoms of having too much alcohol.  If your health care provider has given you approval to drink alcohol, do so in moderation and use the  following guidelines:  Women should not have more than one drink per day, and men should not have more than two drinks per day. One drink is equal to:  12 oz of beer.  5 oz of wine.  1 oz of hard liquor.  Do not drink on an empty stomach.  Keep yourself hydrated. Have water, diet soda, or unsweetened iced tea.  Regular soda, juice, and other mixers might contain a lot of carbohydrates and should be counted. WHAT FOODS ARE NOT RECOMMENDED? As you make food choices, it is important to remember that all foods are not the same. Some foods have fewer nutrients per serving than other foods, even though they might have the same number of calories or carbohydrates. It is difficult to get your body what it needs when you eat foods with fewer nutrients. Examples of foods that you should avoid that are high in calories and carbohydrates but low in nutrients include:  Trans fats (most processed foods list trans fats on the Nutrition Facts label).  Regular soda.  Juice.  Candy.  Sweets, such as cake, pie, doughnuts, and cookies.  Fried foods. WHAT FOODS CAN I EAT? Eat nutrient-rich foods, which will nourish your body and keep you healthy. The food you should eat also will depend on several factors, including:  The calories you need.  The medicines you take.  Your weight.  Your blood glucose level.  Your blood pressure level.  Your cholesterol level. You should eat a variety of foods, including:  Protein.  Lean cuts of meat.  Proteins low in saturated fats, such as fish, egg whites, and beans. Avoid processed meats.  Fruits and vegetables.  Fruits and vegetables that may help control blood glucose levels, such as apples, mangoes, and yams.  Dairy products.  Choose fat-free or low-fat dairy products, such as milk, yogurt, and cheese.  Grains, bread, pasta, and rice.  Choose whole grain products, such as multigrain bread, whole oats, and brown rice.  These foods may help  control blood pressure.  Fats.  Foods containing healthful fats, such as nuts, avocado, olive oil, canola oil, and fish. DOES EVERYONE WITH DIABETES MELLITUS HAVE THE SAME MEAL PLAN? Because every person with diabetes mellitus is different, there is not one meal plan that works for everyone. It is very important that you meet with a dietitian who will help you create a meal plan that is just right for you.   This information is not intended to replace advice given to you by your health care provider. Make sure you discuss any questions you have with your health care provider.   Document Released: 09/03/2005 Document Revised: 12/28/2014 Document Reviewed: 11/03/2013 Elsevier Interactive Patient Education [employer]

## 2016-01-10 ENCOUNTER — Encounter: Payer: Self-pay | Admitting: Family Medicine

## 2016-01-10 LAB — CBC WITH DIFFERENTIAL/PLATELET
BASOS: 1 %
Basophils Absolute: 0.1 10*3/uL (ref 0.0–0.2)
EOS (ABSOLUTE): 0.2 10*3/uL (ref 0.0–0.4)
EOS: 2 %
HEMATOCRIT: 40.3 % (ref 37.5–51.0)
HEMOGLOBIN: 13.5 g/dL (ref 12.6–17.7)
IMMATURE GRANS (ABS): 0 10*3/uL (ref 0.0–0.1)
IMMATURE GRANULOCYTES: 0 %
LYMPHS: 27 %
Lymphocytes Absolute: 2.1 10*3/uL (ref 0.7–3.1)
MCH: 28.9 pg (ref 26.6–33.0)
MCHC: 33.5 g/dL (ref 31.5–35.7)
MCV: 86 fL (ref 79–97)
MONOCYTES: 6 %
Monocytes Absolute: 0.5 10*3/uL (ref 0.1–0.9)
NEUTROS ABS: 4.9 10*3/uL (ref 1.4–7.0)
NEUTROS PCT: 64 %
PLATELETS: 262 10*3/uL (ref 150–379)
RBC: 4.67 x10E6/uL (ref 4.14–5.80)
RDW: 14.4 % (ref 12.3–15.4)
WBC: 7.7 10*3/uL (ref 3.4–10.8)

## 2016-01-10 LAB — PSA: PROSTATE SPECIFIC AG, SERUM: 0.9 ng/mL (ref 0.0–4.0)

## 2016-01-10 LAB — LIPID PANEL W/O CHOL/HDL RATIO
Cholesterol, Total: 176 mg/dL (ref 100–199)
HDL: 44 mg/dL (ref >39)
LDL CALC: 91 mg/dL (ref 0–99)
Triglycerides: 206 mg/dL — ABNORMAL HIGH (ref 0–149)
VLDL CHOLESTEROL CAL: 41 mg/dL — AB (ref 5–40)

## 2016-01-10 LAB — COMPREHENSIVE METABOLIC PANEL
ALBUMIN: 4.4 g/dL (ref 3.5–5.5)
ALT: 25 IU/L (ref 0–44)
AST: 22 IU/L (ref 0–40)
Albumin/Globulin Ratio: 1.7 (ref 1.1–2.5)
Alkaline Phosphatase: 79 IU/L (ref 39–117)
BILIRUBIN TOTAL: 0.3 mg/dL (ref 0.0–1.2)
BUN / CREAT RATIO: 10 (ref 9–20)
BUN: 12 mg/dL (ref 6–24)
CALCIUM: 9.5 mg/dL (ref 8.7–10.2)
CO2: 20 mmol/L (ref 18–29)
CREATININE: 1.22 mg/dL (ref 0.76–1.27)
Chloride: 99 mmol/L (ref 96–106)
GFR, EST AFRICAN AMERICAN: 76 mL/min/{1.73_m2} (ref >59)
GFR, EST NON AFRICAN AMERICAN: 65 mL/min/{1.73_m2} (ref >59)
GLUCOSE: 189 mg/dL — AB (ref 65–99)
Globulin, Total: 2.6 g/dL (ref 1.5–4.5)
Potassium: 4.4 mmol/L (ref 3.5–5.2)
Sodium: 137 mmol/L (ref 134–144)
TOTAL PROTEIN: 7 g/dL (ref 6.0–8.5)

## 2016-02-06 ENCOUNTER — Ambulatory Visit (INDEPENDENT_AMBULATORY_CARE_PROVIDER_SITE_OTHER): Payer: Medicare Other | Admitting: Family Medicine

## 2016-02-06 ENCOUNTER — Encounter: Payer: Self-pay | Admitting: Family Medicine

## 2016-02-06 VITALS — BP 119/66 | HR 69 | Temp 97.6°F | Ht 69.6 in | Wt 189.0 lb

## 2016-02-06 DIAGNOSIS — M545 Low back pain: Secondary | ICD-10-CM | POA: Diagnosis not present

## 2016-02-06 DIAGNOSIS — F332 Major depressive disorder, recurrent severe without psychotic features: Secondary | ICD-10-CM

## 2016-02-06 DIAGNOSIS — G8929 Other chronic pain: Secondary | ICD-10-CM | POA: Diagnosis not present

## 2016-02-06 DIAGNOSIS — I110 Hypertensive heart disease with heart failure: Secondary | ICD-10-CM | POA: Diagnosis not present

## 2016-02-06 DIAGNOSIS — M87052 Idiopathic aseptic necrosis of left femur: Secondary | ICD-10-CM

## 2016-02-06 DIAGNOSIS — Z1211 Encounter for screening for malignant neoplasm of colon: Secondary | ICD-10-CM | POA: Diagnosis not present

## 2016-02-06 LAB — FECAL OCCULT BLOOD, GUAIAC
SPECIMEN 1: NEGATIVE
SPECIMEN 2: NEGATIVE
Specimen 3: NEGATIVE

## 2016-02-06 MED ORDER — CYCLOBENZAPRINE HCL 10 MG PO TABS: 10.0000 mg | ORAL_TABLET | Freq: Three times a day (TID) | ORAL | Status: DC | PRN

## 2016-02-06 MED ORDER — OXYCODONE-ACETAMINOPHEN 10-325 MG PO TABS
1.0000 | ORAL_TABLET | Freq: Four times a day (QID) | ORAL | Status: DC | PRN
Start: 2016-02-06 — End: 2016-03-05

## 2016-02-06 MED ORDER — QUETIAPINE FUMARATE 25 MG PO TABS: 25.0000 mg | ORAL_TABLET | Freq: Every day | ORAL | Status: DC

## 2016-02-06 NOTE — Assessment & Plan Note (Signed)
Still hasn't seen ortho. Pain and depression worsening due to inability to do anything and being on pain meds. Vascular surgery said that he was fine to move forward with his THR without stenting as that is what was bothering him more. Encouraged patient to follow up with orthopedics as this may make several things better for him. He states that he will go.

## 2016-02-06 NOTE — Progress Notes (Signed)
BP 119/66 mmHg  Pulse 69  Temp(Src) 97.6 F (36.4 C)  Ht 5' 9.6" (1.768 m)  Wt 189 lb (85.73 kg)  BMI 27.43 kg/m2  SpO2 98%   Subjective:    Patient ID: Lucas Weiss, male    DOB: 08-13-1956, 60 y.o.   MRN: [id number]  HPI: Lucas Weiss is a 60 y.o. male  Chief Complaint  Patient presents with  . Depression  . Hypertension   DEPRESSION- tried the wellbutrin and felt really dry on it.  Mood status: stable Satisfied with current treatment?: no Symptom severity: moderate  Duration of current treatment : chronic Side effects: yes, very dry mouth Medication compliance: good compliance Psychotherapy/counseling: no  Depressed mood: yes Anxious mood: yes Anhedonia: no Significant weight loss or gain: no Insomnia: no  Fatigue: no Feelings of worthlessness or guilt: yes Impaired concentration/indecisiveness: yes Suicidal ideations: no Hopelessness: no Crying spells: yes Depression screen [facility] 2/9 02/06/2016 01/09/2016 08/15/2015 06/14/2015  Decreased Interest Down, Depressed, Hopeless PHQ - 2 Score Altered sleeping Tired, decreased energy Change in appetite Feeling bad or failure about yourself  Trouble concentrating Moving slowly or fidgety/restless Suicidal thoughts 0 0 0 0  PHQ-9 Score Difficult doing work/chores Very difficult Very difficult Very difficult Somewhat difficult   HYPERTENSION Hypertension status: controlled  Satisfied with current treatment? yes Duration of hypertension: chronic BP monitoring frequency:  not checking BP medication side effects:  no Medication compliance: excellent compliance Aspirin: no Recurrent headaches: no Visual changes: no Palpitations: no Dyspnea: no Chest pain: no Lower extremity edema: no Dizzy/lightheaded: no  CHRONIC PAIN  Present dose: 60 Morphine equivalents Pain control status: controlled Duration: chronic Location: L  hip Quality: sharp Current Pain Level: mild Previous Pain Level: severe Breakthrough pain: no Benefit from narcotic medications: yes What Activities task can be accomplished with current medication?: care for his granddaughter, take care of his house, sees his grandkids every Tuesday Interested in weaning off narcotics:no  Stool softners/OTC fiber: yes Previous pain specialty evaluation: no Non-narcotic analgesic meds: yes- were not tolerated Narcotic contract: yes  Relevant past medical, surgical, family and social history reviewed and updated as indicated. Interim medical history since our last visit reviewed. Allergies and medications reviewed and updated.  Review of Systems  Constitutional: Negative.   Respiratory: Negative.   Cardiovascular: Negative.   Musculoskeletal: Positive for myalgias, back pain, arthralgias and gait problem. Negative for joint swelling, neck pain and neck stiffness.  Neurological: Negative.   Psychiatric/Behavioral: Negative.     Per HPI unless specifically indicated above     Objective:    BP 119/66 mmHg  Pulse 69  Temp(Src) 97.6 F (36.4 C)  Ht 5' 9.6" (1.768 m)  Wt 189 lb (85.73 kg)  BMI 27.43 kg/m2  SpO2 98%  Wt Readings from Last 3 Encounters:  02/06/16 189 lb (85.73 kg)  01/09/16 180 lb (81.647 kg)  11/21/15 185 lb 8 oz (84.142 kg)    Physical Exam  Constitutional: He is oriented to person, place, and time. He appears well-developed and well-nourished. No distress.  HENT:  Head: Normocephalic and atraumatic.  Right Ear: Hearing normal.  Left Ear: Hearing normal.  Nose: Nose normal.  Eyes: Conjunctivae and lids are  normal. Right eye exhibits no discharge. Left eye exhibits no discharge. No scleral icterus.  Cardiovascular: Normal rate, regular rhythm, normal heart sounds and intact distal pulses.  Exam reveals no gallop and no friction rub.   No murmur heard. Pulmonary/Chest: Effort normal and breath sounds normal. No  respiratory distress. He has no wheezes. He has no rales. He exhibits no tenderness.  Musculoskeletal: He exhibits tenderness. He exhibits no edema.  Antalgic gait, atrophy of L leg, tender to palpation of L hip   Neurological: He is alert and oriented to person, place, and time.  Skin: Skin is warm, dry and intact. No rash noted. No erythema. No pallor.  Psychiatric: He has a normal mood and affect. His speech is normal and behavior is normal. Judgment and thought content normal. Cognition and memory are normal.  Nursing note and vitals reviewed.   Results for orders placed or performed in visit on 02/06/16  [facility] Stool Card-TAKE HOME  Result Value Ref Range   Specimen 1 negative    Specimen 2 negative    Specimen 3 negative       Assessment & Plan:   Problem List Items Addressed This Visit      Cardiovascular and Mediastinum   Hypertensive heart failure (HCC)    Under good control today. Continue current regimen. Continue to monitor.         Musculoskeletal and Integument   Avascular necrosis of bone of left hip (HCC)    Still hasn't seen ortho. Pain and depression worsening due to inability to do anything and being on pain meds. Vascular surgery said that he was fine to move forward with his THR without stenting as that is what was bothering him more. Encouraged patient to follow up with orthopedics as this may make several things better for him. He states that he will go.      Relevant Medications   oxyCODONE-acetaminophen (PERCOCET) 10-325 MG tablet     Other   Severe recurrent major depression (HCC)    Stable today. Still with a lot of social issues. Did not do well with wellbutrin. Will add seroquel. Continue to monitor.       Chronic pain   Relevant Medications   cyclobenzaprine (FLEXERIL) 10 MG tablet   oxyCODONE-acetaminophen (PERCOCET) 10-325 MG tablet    Other Visit Diagnoses    Low back pain without sciatica, unspecified back pain laterality    -  Primary     Refill of his flexeril given today. He thinks it helps. Continue to monitor.     Relevant Medications    cyclobenzaprine (FLEXERIL) 10 MG tablet    oxyCODONE-acetaminophen (PERCOCET) 10-325 MG tablet    Screening for colon cancer        Stool cards brought back today and were negative.         Follow up plan: Return in about 4 weeks (around 03/05/2016) for follow up mood and check on ortho appointment. Marland Kitchen

## 2016-02-06 NOTE — Assessment & Plan Note (Signed)
Under good control today. Continue current regimen. Continue to monitor.  

## 2016-02-06 NOTE — Assessment & Plan Note (Signed)
Stable today. Still with a lot of social issues. Did not do well with wellbutrin. Will add seroquel. Continue to monitor.

## 2016-03-05 ENCOUNTER — Telehealth: Payer: Self-pay | Admitting: Family Medicine

## 2016-03-05 DIAGNOSIS — M87052 Idiopathic aseptic necrosis of left femur: Secondary | ICD-10-CM

## 2016-03-05 DIAGNOSIS — G8929 Other chronic pain: Secondary | ICD-10-CM

## 2016-03-05 MED ORDER — OXYCODONE-ACETAMINOPHEN 10-325 MG PO TABS: 1.0000 | ORAL_TABLET | Freq: Four times a day (QID) | ORAL | Status: DC | PRN

## 2016-03-05 NOTE — Telephone Encounter (Signed)
Pt came in stated he needs a refill on his Percocet. Please call when RX is ready for pick up. Thanks.

## 2016-03-05 NOTE — Telephone Encounter (Signed)
Refill up front. Please make sure he's got the appointment with ortho. Thanks!

## 2016-03-05 NOTE — Telephone Encounter (Signed)
Forward to provider

## 2016-03-05 NOTE — Telephone Encounter (Signed)
Patient notified, I told him twice to make his appointment with ortho.

## 2016-03-10 ENCOUNTER — Other Ambulatory Visit: Payer: Self-pay | Admitting: Family Medicine

## 2016-04-01 DIAGNOSIS — M5442 Lumbago with sciatica, left side: Secondary | ICD-10-CM | POA: Diagnosis not present

## 2016-04-01 DIAGNOSIS — M25552 Pain in left hip: Secondary | ICD-10-CM | POA: Diagnosis not present

## 2016-04-07 ENCOUNTER — Ambulatory Visit (INDEPENDENT_AMBULATORY_CARE_PROVIDER_SITE_OTHER): Payer: Medicare Other | Admitting: Family Medicine

## 2016-04-07 ENCOUNTER — Encounter: Payer: Self-pay | Admitting: Family Medicine

## 2016-04-07 VITALS — BP 128/60 | HR 89 | Temp 98.7°F | Ht 70.0 in | Wt 184.3 lb

## 2016-04-07 DIAGNOSIS — G8929 Other chronic pain: Secondary | ICD-10-CM | POA: Diagnosis not present

## 2016-04-07 DIAGNOSIS — M87052 Idiopathic aseptic necrosis of left femur: Secondary | ICD-10-CM | POA: Diagnosis not present

## 2016-04-07 DIAGNOSIS — F332 Major depressive disorder, recurrent severe without psychotic features: Secondary | ICD-10-CM | POA: Diagnosis not present

## 2016-04-07 DIAGNOSIS — I110 Hypertensive heart disease with heart failure: Secondary | ICD-10-CM

## 2016-04-07 DIAGNOSIS — E119 Type 2 diabetes mellitus without complications: Secondary | ICD-10-CM

## 2016-04-07 LAB — BAYER DCA HB A1C WAIVED: HB A1C: 6.4 % (ref <7.0)

## 2016-04-07 MED ORDER — QUETIAPINE FUMARATE 25 MG PO TABS: 25.0000 mg | ORAL_TABLET | Freq: Every day | ORAL | Status: DC

## 2016-04-07 MED ORDER — LISINOPRIL 5 MG PO TABS: 5.0000 mg | ORAL_TABLET | Freq: Every day | ORAL | Status: DC

## 2016-04-07 MED ORDER — OXYCODONE-ACETAMINOPHEN 10-325 MG PO TABS: 1.0000 | ORAL_TABLET | Freq: Four times a day (QID) | ORAL | Status: DC | PRN

## 2016-04-07 MED ORDER — DOCUSATE SODIUM 100 MG PO CAPS: 100.0000 mg | ORAL_CAPSULE | Freq: Two times a day (BID) | ORAL | Status: DC

## 2016-04-07 MED ORDER — CLOPIDOGREL BISULFATE 75 MG PO TABS: 75.0000 mg | ORAL_TABLET | Freq: Every day | ORAL | Status: DC

## 2016-04-07 NOTE — Assessment & Plan Note (Signed)
Saw orthopedics. Will obtain records from them. Will continue pain medicine for this time. Would like to have surgery so he can come off it.

## 2016-04-07 NOTE — Progress Notes (Signed)
BP 128/60 mmHg  Pulse 89  Temp(Src) 98.7 F (37.1 C)  Ht 5\' 10"  (1.778 m)  Wt 184 lb 4.8 oz (83.598 kg)  BMI 26.44 kg/m2  SpO2 98%   Subjective:    Patient ID: Lucas Weiss, male    DOB: September 18, 1956, 60 y.o.   MRN: [id number]  HPI: Lucas Weiss is a 60 y.o. male  Chief Complaint  Patient presents with  . Diabetes  . Pain  . Depression   DIABETES Hypoglycemic episodes:no Polydipsia/polyuria: no Visual disturbance: yes- chronic and improved Chest pain: no Paresthesias: yes Glucose Monitoring: no Taking Insulin?: no Blood Pressure Monitoring: not checking Retinal Examination: Not up to Date Foot Exam: Up to Date Diabetic Education: Not Completed Pneumovax: Up to Date Influenza: Up to Date Aspirin: no  HYPERTENSION Hypertension status: controlled  Satisfied with current treatment? yes Duration of hypertension: chronic BP monitoring frequency:  not checking BP medication side effects:  no Medication compliance: excellent compliance Aspirin: yes Recurrent headaches: no Visual changes: no Palpitations: no Dyspnea: no Chest pain: no Lower extremity edema: no Dizzy/lightheaded: no  DEPRESSION- likes the seroquel. Doing better. Would like to continue his medicine at this time. Still under a lot of psychosocial stress Mood status: better Satisfied with current treatment?: yes Symptom severity: moderate  Duration of current treatment : chronic Side effects: no Medication compliance: excellent compliance Psychotherapy/counseling: no  Depressed mood: yes Anxious mood: yes Anhedonia: no Significant weight loss or gain: no Insomnia: no  Fatigue: yes Feelings of worthlessness or guilt: yes Impaired concentration/indecisiveness: yes Suicidal ideations: no Hopelessness: yes Crying spells: yes [facility] 04/07/2016 02/06/2016 01/09/2016 08/15/2015 06/14/2015  Decreased Interest 3 2 3 2 3   Down, Depressed, Hopeless 2 3 3 2 2   PHQ - 2 Score 5 5 6 4 5    Altered sleeping 3 3 3 3 3   Tired, decreased energy 3 3 3 3 3   Change in appetite 1 3 3 1 2   Feeling bad or failure about yourself  3 2 3 2 3   Trouble concentrating 3 3 3 3 3   Moving slowly or fidgety/restless 2 2 3 2 2   Suicidal thoughts 0 0 0 0 0  PHQ-9 Score 20 21 24 18 21   Difficult doing work/chores - Very difficult Very difficult Very difficult Somewhat difficult   CHRONIC PAIN- saw orthopedics and was started on an anti-inflammatory which he didn't remember the name of. They do want to do surgery, but he is not sure what's going on. Will   Present dose: 60 Morphine equivalents Pain control status: controlled Duration: chronic Location: L hip Quality: sharp Current Pain Level: mild Previous Pain Level: severe Breakthrough pain: no Benefit from narcotic medications: yes What Activities task can be accomplished with current medication?: care for his granddaughter, take care of his house, sees his grandkids every Tuesday Interested in weaning off narcotics:no  Stool softners/OTC fiber: yes Previous pain specialty evaluation: no Non-narcotic analgesic meds: yes- were not tolerated Narcotic contract: yes  Relevant past medical, surgical, family and social history reviewed and updated as indicated. Interim medical history since our last visit reviewed. Allergies and medications reviewed and updated.  Review of Systems  Constitutional: Negative.   Respiratory: Negative.   Cardiovascular: Negative.   Musculoskeletal: Positive for myalgias, back pain, arthralgias and gait problem. Negative for joint swelling, neck pain and neck stiffness.  Skin: Negative.   Neurological: Negative.   Psychiatric/Behavioral: Positive for dysphoric mood. Negative for suicidal ideas, hallucinations, behavioral problems, confusion,  sleep disturbance, self-injury, decreased concentration and agitation. The patient is nervous/anxious. The patient is not hyperactive.     Per HPI unless specifically  indicated above     Objective:    BP 128/60 mmHg  Pulse 89  Temp(Src) 98.7 F (37.1 C)  Ht  (1.778 m)  Wt 184 lb 4.8 oz (83.598 kg)  BMI 26.44 kg/m2  SpO2 98%  Wt Readings from Last 3 Encounters:  04/07/16 184 lb 4.8 oz (83.598 kg)  02/06/16 189 lb (85.73 kg)  01/09/16 180 lb (81.647 kg)    Physical Exam  Constitutional: He is oriented to person, place, and time. He appears well-developed and well-nourished. No distress.  HENT:  Head: Normocephalic and atraumatic.  Right Ear: Hearing normal.  Left Ear: Hearing normal.  Nose: Nose normal.  Eyes: Conjunctivae and lids are normal. Right eye exhibits no discharge. Left eye exhibits no discharge. No scleral icterus.  Cardiovascular: Normal rate, regular rhythm, normal heart sounds and intact distal pulses.  Exam reveals no gallop and no friction rub.   No murmur heard. Pulmonary/Chest: Effort normal and breath sounds normal. No respiratory distress. He has no wheezes. He has no rales. He exhibits no tenderness.  Musculoskeletal:  Tenderness, atrophy L hip and thigh  Neurological: He is alert and oriented to person, place, and time.  Skin: Skin is warm, dry and intact. No rash noted. No erythema. No pallor.  Psychiatric: He has a normal mood and affect. His speech is normal and behavior is normal. Judgment and thought content normal. Cognition and memory are normal.  Nursing note and vitals reviewed.   Results for orders placed or performed in visit on 02/06/16  [facility] Stool Card-TAKE HOME  Result Value Ref Range   Specimen 1 negative    Specimen 2 negative    Specimen 3 negative       Assessment & Plan:   Problem List Items Addressed This Visit      Cardiovascular and Mediastinum   Hypertensive heart failure (HCC)    Under better control on recheck. Continue current regimen. Continue to monitor.       Relevant Medications   lisinopril (PRINIVIL,ZESTRIL) 5 MG tablet     Musculoskeletal and Integument    Avascular necrosis of bone of left hip (HCC)    Saw ortho and they are planning on surgery. Pain and depression worsening due to inability to do anything and being on pain meds. Vascular surgery said that he was fine to move forward with his THR without stenting as that is what was bothering him more. Encouraged patient to follow up with orthopedics as this may make several things better for him. He states that he will go.        Relevant Medications   oxyCODONE-acetaminophen (PERCOCET) 10-325 MG tablet     Other   Severe recurrent major depression (HCC)    Slightly improved with medication. Continue current regimen. Continue to monitor. Call with any problems.       Chronic pain    Saw orthopedics. Will obtain records from them. Will continue pain medicine for this time. Would like to have surgery so he can come off it.      Relevant Medications   methocarbamol (ROBAXIN) 500 MG tablet   oxyCODONE-acetaminophen (PERCOCET) 10-325 MG tablet   docusate sodium (COLACE) 100 MG capsule   Diet-controlled diabetes mellitus (HCC) - Primary    Back to A1c of 6.4. Continue diet and exercise. Continue to monitor. Recheck in 6  months. Appointment scheduled for his eye exam.      Relevant Medications   lisinopril (PRINIVIL,ZESTRIL) 5 MG tablet   Other Relevant Orders   Bayer DCA Hb A1c Waived       Follow up plan: Return in about 4 weeks (around 05/05/2016).

## 2016-04-07 NOTE — Assessment & Plan Note (Signed)
Saw ortho and they are planning on surgery. Pain and depression worsening due to inability to do anything and being on pain meds. Vascular surgery said that he was fine to move forward with his THR without stenting as that is what was bothering him more. Encouraged patient to follow up with orthopedics as this may make several things better for him. He states that he will go.

## 2016-04-07 NOTE — Assessment & Plan Note (Signed)
Back to A1c of 6.4. Continue diet and exercise. Continue to monitor. Recheck in 6 months. Appointment scheduled for his eye exam.

## 2016-04-07 NOTE — Assessment & Plan Note (Signed)
Slightly improved with medication. Continue current regimen. Continue to monitor. Call with any problems.

## 2016-04-07 NOTE — Assessment & Plan Note (Signed)
Under better control on recheck. Continue current regimen. Continue to monitor.  

## 2016-04-29 DIAGNOSIS — M25552 Pain in left hip: Secondary | ICD-10-CM | POA: Diagnosis not present

## 2016-05-07 ENCOUNTER — Ambulatory Visit (INDEPENDENT_AMBULATORY_CARE_PROVIDER_SITE_OTHER): Payer: Medicare Other | Admitting: Family Medicine

## 2016-05-07 ENCOUNTER — Encounter: Payer: Self-pay | Admitting: Family Medicine

## 2016-05-07 VITALS — BP 127/73 | HR 99 | Temp 98.9°F | Wt 189.0 lb

## 2016-05-07 DIAGNOSIS — M87052 Idiopathic aseptic necrosis of left femur: Secondary | ICD-10-CM | POA: Diagnosis not present

## 2016-05-07 DIAGNOSIS — G8929 Other chronic pain: Secondary | ICD-10-CM | POA: Diagnosis not present

## 2016-05-07 MED ORDER — OXYCODONE-ACETAMINOPHEN 10-325 MG PO TABS: 1.0000 | ORAL_TABLET | Freq: Four times a day (QID) | ORAL | Status: DC | PRN

## 2016-05-07 NOTE — Assessment & Plan Note (Addendum)
Saw ortho and they are planning on surgery for May 30th. Doing a bit better now that he has something to look forward to. Vascular surgery said that he was fine to move forward with his THR without stenting as that is what was bothering him more. We will see him after surgery.

## 2016-05-07 NOTE — Assessment & Plan Note (Signed)
To have hip replacement May 30. Will continue pain medicine for this time and reevaluate following his surgery.

## 2016-05-07 NOTE — Progress Notes (Signed)
BP 127/73 mmHg  Pulse 99  Temp(Src) 98.9 F (37.2 C)  Wt 189 lb (85.73 kg)  SpO2 99%   Subjective:    Patient ID: Lucas Weiss, male    DOB: 07-02-1956, 60 y.o.   MRN: [id number]  HPI: Lucas Weiss is a 60 y.o. male  Chief Complaint  Patient presents with  . Pain    chronic pain follow up; having hip surgery on May 30th   CHRONIC PAIN- saw the orthopedic, going to have hip replacement on 05/19/16 Present dose: 60 Morphine equivalents Pain control status: slightly exacerbated Duration: years Location: Low back and L hip Quality: sharp, dull, aching, burning, cramping, ill-defined, itchy, pressure-like, pulling, shooting, sore, stabbing, tender, tearing and throbbing Current Pain Level: 10/10 Previous Pain Level: 10/10 Breakthrough pain: no Benefit from narcotic medications: no What Activities task can be accomplished with current medication?: Taking care of his grand daughter, ADLs Interested in weaning off narcotics:no   Stool softners/OTC fiber: no  Previous pain specialty evaluation: no Non-narcotic analgesic meds: no Narcotic contract: no   Relevant past medical, surgical, family and social history reviewed and updated as indicated. Interim medical history since our last visit reviewed. Allergies and medications reviewed and updated.  Review of Systems  Constitutional: Negative.   Respiratory: Negative.   Cardiovascular: Negative.   Musculoskeletal: Positive for myalgias, back pain, arthralgias and gait problem. Negative for joint swelling, neck pain and neck stiffness.  Psychiatric/Behavioral: Negative.     Per HPI unless specifically indicated above     Objective:    BP 127/73 mmHg  Pulse 99  Temp(Src) 98.9 F (37.2 C)  Wt 189 lb (85.73 kg)  SpO2 99%  Wt Readings from Last 3 Encounters:  05/07/16 189 lb (85.73 kg)  04/07/16 184 lb 4.8 oz (83.598 kg)  02/06/16 189 lb (85.73 kg)    Physical Exam  Constitutional: He is oriented to person, place, and  time. He appears well-developed and well-nourished. No distress.  HENT:  Head: Normocephalic and atraumatic.  Right Ear: Hearing normal.  Left Ear: Hearing normal.  Nose: Nose normal.  Eyes: Conjunctivae and lids are normal. Right eye exhibits no discharge. Left eye exhibits no discharge. No scleral icterus.  Cardiovascular: Normal rate, regular rhythm, normal heart sounds and intact distal pulses.  Exam reveals no gallop and no friction rub.   No murmur heard. Pulmonary/Chest: Effort normal and breath sounds normal. No respiratory distress. He has no wheezes. He has no rales. He exhibits no tenderness.  Musculoskeletal:  Tenderness, atrophy L hip and thigh   Neurological: He is alert and oriented to person, place, and time.  Skin: Skin is warm, dry and intact. No rash noted. No erythema. No pallor.  Psychiatric: He has a normal mood and affect. His speech is normal and behavior is normal. Judgment and thought content normal. Cognition and memory are normal.  Nursing note and vitals reviewed.   Results for orders placed or performed in visit on 04/07/16  Bayer DCA Hb A1c Waived  Result Value Ref Range   Bayer DCA Hb A1c Waived 6.4 <7.0 %      Assessment & Plan:   Problem List Items Addressed This Visit      Musculoskeletal and Integument   Avascular necrosis of bone of left hip (HCC) - Primary    Saw ortho and they are planning on surgery for May 30th. Doing a bit better now that he has something to look forward to. Vascular surgery said that he  was fine to move forward with his THR without stenting as that is what was bothering him more. We will see him after surgery.      Relevant Medications   oxyCODONE-acetaminophen (PERCOCET) 10-325 MG tablet     Other   Chronic pain    To have hip replacement May 30. Will continue pain medicine for this time and reevaluate following his surgery.      Relevant Medications   oxyCODONE-acetaminophen (PERCOCET) 10-325 MG tablet        Follow up plan: Return in about 4 weeks (around 06/04/2016) for Follow up pain.

## 2016-05-11 ENCOUNTER — Other Ambulatory Visit: Payer: Self-pay | Admitting: Physician Assistant

## 2016-05-12 ENCOUNTER — Encounter (HOSPITAL_COMMUNITY)
Admission: RE | Admit: 2016-05-12 | Discharge: 2016-05-12 | Disposition: A | Discharge status: Home / Self Care | Payer: Medicare Other | Source: Ambulatory Visit | Attending: Orthopaedic Surgery | Admitting: Orthopaedic Surgery

## 2016-05-12 ENCOUNTER — Encounter (HOSPITAL_COMMUNITY): Payer: Self-pay

## 2016-05-12 DIAGNOSIS — I10 Essential (primary) hypertension: Secondary | ICD-10-CM | POA: Diagnosis not present

## 2016-05-12 DIAGNOSIS — M879 Osteonecrosis, unspecified: Secondary | ICD-10-CM | POA: Insufficient documentation

## 2016-05-12 DIAGNOSIS — I2510 Atherosclerotic heart disease of native coronary artery without angina pectoris: Secondary | ICD-10-CM | POA: Insufficient documentation

## 2016-05-12 DIAGNOSIS — Z01818 Encounter for other preprocedural examination: Secondary | ICD-10-CM | POA: Insufficient documentation

## 2016-05-12 DIAGNOSIS — Z79899 Other long term (current) drug therapy: Secondary | ICD-10-CM | POA: Insufficient documentation

## 2016-05-12 DIAGNOSIS — Z951 Presence of aortocoronary bypass graft: Secondary | ICD-10-CM | POA: Insufficient documentation

## 2016-05-12 DIAGNOSIS — Z7902 Long term (current) use of antithrombotics/antiplatelets: Secondary | ICD-10-CM | POA: Diagnosis not present

## 2016-05-12 DIAGNOSIS — R413 Other amnesia: Secondary | ICD-10-CM | POA: Diagnosis not present

## 2016-05-12 DIAGNOSIS — Z86718 Personal history of other venous thrombosis and embolism: Secondary | ICD-10-CM | POA: Insufficient documentation

## 2016-05-12 DIAGNOSIS — I252 Old myocardial infarction: Secondary | ICD-10-CM | POA: Diagnosis not present

## 2016-05-12 DIAGNOSIS — I739 Peripheral vascular disease, unspecified: Secondary | ICD-10-CM | POA: Diagnosis not present

## 2016-05-12 DIAGNOSIS — Z8673 Personal history of transient ischemic attack (TIA), and cerebral infarction without residual deficits: Secondary | ICD-10-CM | POA: Diagnosis not present

## 2016-05-12 DIAGNOSIS — I4510 Unspecified right bundle-branch block: Secondary | ICD-10-CM | POA: Diagnosis not present

## 2016-05-12 DIAGNOSIS — E7800 Pure hypercholesterolemia, unspecified: Secondary | ICD-10-CM | POA: Insufficient documentation

## 2016-05-12 DIAGNOSIS — E119 Type 2 diabetes mellitus without complications: Secondary | ICD-10-CM | POA: Diagnosis not present

## 2016-05-12 DIAGNOSIS — Z01812 Encounter for preprocedural laboratory examination: Secondary | ICD-10-CM | POA: Diagnosis not present

## 2016-05-12 DIAGNOSIS — Z87891 Personal history of nicotine dependence: Secondary | ICD-10-CM | POA: Diagnosis not present

## 2016-05-12 HISTORY — DX: Pneumonia, unspecified organism: J18.9

## 2016-05-12 HISTORY — DX: Reserved for inherently not codable concepts without codable children: IMO0001

## 2016-05-12 HISTORY — DX: Unspecified intracranial injury with loss of consciousness of unspecified duration, initial encounter: S06.9X9A

## 2016-05-12 LAB — BASIC METABOLIC PANEL
ANION GAP: 8 (ref 5–15)
BUN: 13 mg/dL (ref 6–20)
CHLORIDE: 104 mmol/L (ref 101–111)
CO2: 23 mmol/L (ref 22–32)
CREATININE: 1.17 mg/dL (ref 0.61–1.24)
Calcium: 9.5 mg/dL (ref 8.9–10.3)
GFR calc non Af Amer: >60 mL/min (ref >60)
Glucose, Bld: 209 mg/dL — ABNORMAL HIGH (ref 65–99)
Potassium: 4.6 mmol/L (ref 3.5–5.1)
Sodium: 135 mmol/L (ref 135–145)

## 2016-05-12 LAB — GLUCOSE, CAPILLARY: Glucose-Capillary: 213 mg/dL — ABNORMAL HIGH (ref 65–99)

## 2016-05-12 LAB — CBC
HCT: 39.6 % (ref 39.0–52.0)
Hemoglobin: 12.9 g/dL — ABNORMAL LOW (ref 13.0–17.0)
MCH: 27.7 pg (ref 26.0–34.0)
MCHC: 32.6 g/dL (ref 30.0–36.0)
MCV: 85 fL (ref 78.0–100.0)
Platelets: 189 10*3/uL (ref 150–400)
RBC: 4.66 MIL/uL (ref 4.22–5.81)
RDW: 14 % (ref 11.5–15.5)
WBC: 8.1 10*3/uL (ref 4.0–10.5)

## 2016-05-12 LAB — SURGICAL PCR SCREEN
MRSA, PCR: NEGATIVE
Staphylococcus aureus: NEGATIVE

## 2016-05-12 NOTE — Pre-Procedure Instructions (Signed)
    Mcarthur RossettiSteven M Muzyka  05/12/2016    Your procedure is scheduled on Tuesday, May 30.  [facility]   Call this number if you have problems the morning of surgery:[phone]                   For any other questions, please call [phone], Monday - Friday 8 AM - 4 PM.    Remember:  Do not eat food or drink liquids after midnight Monday, May 29.  Take these medicines the morning of surgery with A SIP OF WATER: Citalopram, Metoprolol.                Take if needed Nitroglycerin, Oxycodone- Acetaminophen.              STOP Plavix as instructed by Dr Magnus IvanBlackman.                Do not take any Aspirin, Aspirin Products, Ibuprofen, Naproxen (Aleve) , Herbal Mediatation or Vitamins.    Do not wear jewelry, make-up or nail polish.  Do not wear lotions, powders, or perfumes.               Men may shave  neck and face.  Do not bring valuables to the hospital.  [facility] any belongings or valuables.  Contacts, dentures or bridgework may not be worn into surgery.  Leave your suitcase in the car.  After surgery it may be brought to your room.  For patients admitted to the hospital, discharge time will be determined by your treatment te  Please read over the following fact sheets that you were given: Incentive Spirometry, [place] - Preparing for Surgery, Patient Instructions for Mupirocin Application (* if needed), Managing Diabetes

## 2016-05-12 NOTE — Progress Notes (Signed)
   How to Manage Your Diabetes Before and After Surgery  Why is it important to control my blood sugar before and after surgery? . Improving blood sugar levels before and after surgery helps healing and can limit problems. . A way of improving blood sugar control is eating a healthy diet by: o  Eating less sugar and carbohydrates o  Increasing activity/exercise o  Talking with your doctor about reaching your blood sugar goals . High blood sugars (greater than 180 mg/dL) can raise your risk of infections and slow your recovery, so you will need to focus on controlling your diabetes during the weeks before surgery. . Make sure that the doctor who takes care of your diabetes knows about your planned surgery including the date and location.  How do I manage my blood sugar before surgery? . Check your blood sugar at least 4 times a day, starting 2 days before surgery, to make sure that the level is not too high or low. o Check your blood sugar the morning of your surgery when you wake up and every 2 hours until you get to the Short Stay unit. . If your blood sugar is less than 70 mg/dL, you will need to treat for low blood sugar: o Do not take insulin. o Treat a low blood sugar (less than 70 mg/dL) with  cup of clear juice (cranberry or apple), 4 glucose tablets, OR glucose gel. o Recheck blood sugar in 15 minutes after treatment (to make sure it is greater than 70 mg/dL). If your blood sugar is not greater than 70 mg/dL on recheck, call [phone] for further instructions. . Report your blood sugar to the short stay nurse when you get to Short Stay.  . If you are admitted to the hospital after surgery: o Your blood sugar will be checked by the staff and you will probably be given insulin after surgery (instead of oral diabetes medicines) to make sure you have good blood sugar levels. o The goal for blood sugar control after surgery is 80-180 mg/dL.           

## 2016-05-13 LAB — HEMOGLOBIN A1C
Hgb A1c MFr Bld: 7.1 % — ABNORMAL HIGH (ref 4.8–5.6)
MEAN PLASMA GLUCOSE: 157 mg/dL

## 2016-05-13 NOTE — Progress Notes (Addendum)
Anesthesia Chart Review: Patient is a 60 year old male scheduled for left THA on 05/19/16 by Dr. Doneen Poissonhristopher Blackman. Anesthesia type is posted as Choice.  History includes former smoker, CAD s/p CABG (LIMA-LAD, SVG-DIAG, SVG-OM2-OM3, SVG-PDA) 07/20/08, MI, CHF, PAD s/p left CIA stent 09/15/12 (Dr. Nanetta BattyJonathan Berry) with occluded bilateral SFA stents, DVT, HTN, renal artery stenosis, hypercholesterolemia, hiatal hernia, ETOH abuse (in remission), DM2 (diet controlled), CVA '09 (post-CABG) with left eye blindness and memory loss, recurrent major depression.  PCP is [facility] Ole[facility]Megan [facility] who is aware of surgery plans. Last visit 04/07/16. Negative CV ROS at that time. Vascular surgeon is Dr. Fabienne Brunsharles Fields.   Last cardiology visit was with [facility] Dorothyann Pengwayne Callwood [facility] (see Care Everywhere) for preoperative evaluation for hip and back surgery. (Had seen Dr. Alanda AmassWeintraub in the past and Dr. Allyson SabalBerry for PAD.)  Meds include Plavix, lisinopril, lovastatin, Lopressor, Nitro, Percocet, Seroquel. Patient to hold Plavix as instructed by Dr. Magnus IvanBlackman.  05/12/16 EKG: NSR, incomplete right BBB, non-specific T wave abnormality. T wave abnormality is new when compared to 10/21/12 tracing.  10/26/14 Echo ([facility]; Care Everywhere): INTERPRETATION NORMAL LEFT VENTRICULAR SYSTOLIC FUNCTION NORMAL RIGHT VENTRICULAR SYSTOLIC FUNCTION NO VALVULAR REGURGITATION NO VALVULAR STENOSIS Overall ejection fraction of greater 55%  08/10/12 Nuclear stress test: Normal myocardial perfusion scan demonstrating attenuation artifact in the inferior region of the myocardium. No ischemia or infarct/scar is seen in the remaining myocardium. Results unchanged when compared to prior study and within normal variance. This is a low risk scan.   Preoperative labs noted. Cr 1.17. Glucose 209. H/H 12.9/39.6. A1c 7.1.   Discussed with anesthesiologist Dr. Malen GauzeFoster. Recommend preoperative cardiology evaluation  with known CAD/CABG, no recent cardiology follow-up and new T wave changes.  Will notify Dr. Eliberto IvoryBlackman's office.  Velna Ochsllison Malisa Ruggiero, PA-C [facility]/Anesthesiology Phone [phone] 05/13/2016 2:03 PM  Addendum: Patient was seen by cardiologist Dr. Juliann Paresallwood this morning at [facility]. His note is not yet ready for review in Care Everywhere, but he did sign a note of cardiac clearance for surgery. By report, patient had initially told Sherrie from Dr. Eliberto IvoryBlackman's office that he was no longer on Plavix, but it is listed on his medication list here completed on 05/08/16. Today Sherrie contacted Dr. Glennis Brinkallwood's office to clarify when patient could hold Plavix and was told 5 days prior to surgery.    Further evaluation by his assigned anesthesiologist on the day of surgery.  Velna Ochsllison Alyzae Hawkey, PA-C [facility] Phone [phone] 05/14/2016 5:50 PM

## 2016-05-14 DIAGNOSIS — F329 Major depressive disorder, single episode, unspecified: Secondary | ICD-10-CM | POA: Diagnosis not present

## 2016-05-14 DIAGNOSIS — E785 Hyperlipidemia, unspecified: Secondary | ICD-10-CM | POA: Diagnosis not present

## 2016-05-14 DIAGNOSIS — I639 Cerebral infarction, unspecified: Secondary | ICD-10-CM | POA: Diagnosis not present

## 2016-05-14 DIAGNOSIS — I701 Atherosclerosis of renal artery: Secondary | ICD-10-CM | POA: Diagnosis not present

## 2016-05-14 DIAGNOSIS — Z951 Presence of aortocoronary bypass graft: Secondary | ICD-10-CM | POA: Diagnosis not present

## 2016-05-14 DIAGNOSIS — I25118 Atherosclerotic heart disease of native coronary artery with other forms of angina pectoris: Secondary | ICD-10-CM | POA: Diagnosis not present

## 2016-05-14 DIAGNOSIS — Z01818 Encounter for other preprocedural examination: Secondary | ICD-10-CM | POA: Diagnosis not present

## 2016-05-19 ENCOUNTER — Encounter (HOSPITAL_COMMUNITY): Payer: Self-pay | Admitting: General Practice

## 2016-05-19 ENCOUNTER — Inpatient Hospital Stay (HOSPITAL_COMMUNITY): Payer: Medicare Other

## 2016-05-19 ENCOUNTER — Encounter (HOSPITAL_COMMUNITY)
Admission: RE | Disposition: A | Discharge status: Home Health | Payer: Self-pay | Source: Ambulatory Visit | Attending: Orthopaedic Surgery

## 2016-05-19 ENCOUNTER — Inpatient Hospital Stay (HOSPITAL_COMMUNITY): Payer: Medicare Other | Admitting: Vascular Surgery

## 2016-05-19 ENCOUNTER — Inpatient Hospital Stay (HOSPITAL_COMMUNITY)
Admission: RE | Admit: 2016-05-19 | Discharge: 2016-05-22 | DRG: 470 | Disposition: A | Discharge status: Home Health | Payer: Medicare Other | Source: Ambulatory Visit | Attending: Orthopaedic Surgery | Admitting: Orthopaedic Surgery

## 2016-05-19 ENCOUNTER — Inpatient Hospital Stay (HOSPITAL_COMMUNITY): Payer: Medicare Other | Admitting: Anesthesiology

## 2016-05-19 DIAGNOSIS — E785 Hyperlipidemia, unspecified: Secondary | ICD-10-CM | POA: Diagnosis present

## 2016-05-19 DIAGNOSIS — Z23 Encounter for immunization: Secondary | ICD-10-CM

## 2016-05-19 DIAGNOSIS — E1151 Type 2 diabetes mellitus with diabetic peripheral angiopathy without gangrene: Secondary | ICD-10-CM | POA: Diagnosis not present

## 2016-05-19 DIAGNOSIS — I70209 Unspecified atherosclerosis of native arteries of extremities, unspecified extremity: Secondary | ICD-10-CM | POA: Diagnosis present

## 2016-05-19 DIAGNOSIS — Z79891 Long term (current) use of opiate analgesic: Secondary | ICD-10-CM | POA: Diagnosis not present

## 2016-05-19 DIAGNOSIS — M25452 Effusion, left hip: Secondary | ICD-10-CM | POA: Diagnosis present

## 2016-05-19 DIAGNOSIS — E7800 Pure hypercholesterolemia, unspecified: Secondary | ICD-10-CM | POA: Diagnosis present

## 2016-05-19 DIAGNOSIS — Z8249 Family history of ischemic heart disease and other diseases of the circulatory system: Secondary | ICD-10-CM

## 2016-05-19 DIAGNOSIS — I2510 Atherosclerotic heart disease of native coronary artery without angina pectoris: Secondary | ICD-10-CM | POA: Diagnosis present

## 2016-05-19 DIAGNOSIS — Z951 Presence of aortocoronary bypass graft: Secondary | ICD-10-CM

## 2016-05-19 DIAGNOSIS — Z87891 Personal history of nicotine dependence: Secondary | ICD-10-CM

## 2016-05-19 DIAGNOSIS — Z96642 Presence of left artificial hip joint: Secondary | ICD-10-CM

## 2016-05-19 DIAGNOSIS — M8788 Other osteonecrosis, other site: Secondary | ICD-10-CM | POA: Diagnosis present

## 2016-05-19 DIAGNOSIS — M87052 Idiopathic aseptic necrosis of left femur: Secondary | ICD-10-CM | POA: Diagnosis not present

## 2016-05-19 DIAGNOSIS — Z833 Family history of diabetes mellitus: Secondary | ICD-10-CM

## 2016-05-19 DIAGNOSIS — F1021 Alcohol dependence, in remission: Secondary | ICD-10-CM | POA: Diagnosis present

## 2016-05-19 DIAGNOSIS — F332 Major depressive disorder, recurrent severe without psychotic features: Secondary | ICD-10-CM | POA: Diagnosis present

## 2016-05-19 DIAGNOSIS — Z7902 Long term (current) use of antithrombotics/antiplatelets: Secondary | ICD-10-CM | POA: Diagnosis not present

## 2016-05-19 DIAGNOSIS — M1612 Unilateral primary osteoarthritis, left hip: Secondary | ICD-10-CM | POA: Diagnosis not present

## 2016-05-19 DIAGNOSIS — I69398 Other sequelae of cerebral infarction: Secondary | ICD-10-CM | POA: Diagnosis not present

## 2016-05-19 DIAGNOSIS — H5442 Blindness, left eye, normal vision right eye: Secondary | ICD-10-CM | POA: Diagnosis present

## 2016-05-19 DIAGNOSIS — M545 Low back pain: Secondary | ICD-10-CM | POA: Diagnosis present

## 2016-05-19 DIAGNOSIS — Z419 Encounter for procedure for purposes other than remedying health state, unspecified: Secondary | ICD-10-CM

## 2016-05-19 DIAGNOSIS — I739 Peripheral vascular disease, unspecified: Secondary | ICD-10-CM | POA: Diagnosis not present

## 2016-05-19 DIAGNOSIS — E1140 Type 2 diabetes mellitus with diabetic neuropathy, unspecified: Secondary | ICD-10-CM | POA: Diagnosis present

## 2016-05-19 DIAGNOSIS — Z823 Family history of stroke: Secondary | ICD-10-CM | POA: Diagnosis not present

## 2016-05-19 DIAGNOSIS — G8929 Other chronic pain: Secondary | ICD-10-CM | POA: Diagnosis present

## 2016-05-19 DIAGNOSIS — I69311 Memory deficit following cerebral infarction: Secondary | ICD-10-CM

## 2016-05-19 DIAGNOSIS — Z471 Aftercare following joint replacement surgery: Secondary | ICD-10-CM | POA: Diagnosis not present

## 2016-05-19 DIAGNOSIS — Z811 Family history of alcohol abuse and dependence: Secondary | ICD-10-CM

## 2016-05-19 DIAGNOSIS — D62 Acute posthemorrhagic anemia: Secondary | ICD-10-CM | POA: Diagnosis not present

## 2016-05-19 DIAGNOSIS — I252 Old myocardial infarction: Secondary | ICD-10-CM

## 2016-05-19 DIAGNOSIS — M87059 Idiopathic aseptic necrosis of unspecified femur: Secondary | ICD-10-CM | POA: Diagnosis not present

## 2016-05-19 HISTORY — PX: TOTAL HIP ARTHROPLASTY: SHX124

## 2016-05-19 LAB — GLUCOSE, CAPILLARY
GLUCOSE-CAPILLARY: 146 mg/dL — AB (ref 65–99)
Glucose-Capillary: 142 mg/dL — ABNORMAL HIGH (ref 65–99)
Glucose-Capillary: 119 mg/dL — ABNORMAL HIGH (ref 65–99)

## 2016-05-19 SURGERY — ARTHROPLASTY, HIP, TOTAL, ANTERIOR APPROACH
Anesthesia: Monitor Anesthesia Care | Laterality: Left

## 2016-05-19 MED ORDER — ACETAMINOPHEN 325 MG PO TABS
650.0000 mg | ORAL_TABLET | Freq: Four times a day (QID) | ORAL | Status: DC | PRN
  Administered 2016-05-20 – 2016-05-22 (×6): 650 mg via ORAL
  Filled 2016-05-19 (×6): qty 2

## 2016-05-19 MED ORDER — SODIUM CHLORIDE 0.9 % IR SOLN
Status: DC | PRN
  Administered 2016-05-19: 3000 mL

## 2016-05-19 MED ORDER — MENTHOL 3 MG MT LOZG: 1.0000 | LOZENGE | OROMUCOSAL | Status: DC | PRN

## 2016-05-19 MED ORDER — DEXTROSE 5 % IV SOLN
10.0000 mg | INTRAVENOUS | Status: DC | PRN
  Administered 2016-05-19: 50 ug/min via INTRAVENOUS

## 2016-05-19 MED ORDER — PNEUMOCOCCAL VAC POLYVALENT 25 MCG/0.5ML IJ INJ
0.5000 mL | INJECTION | INTRAMUSCULAR | Status: AC
  Administered 2016-05-20: 0.5 mL via INTRAMUSCULAR

## 2016-05-19 MED ORDER — LIDOCAINE 2% (20 MG/ML) 5 ML SYRINGE
INTRAMUSCULAR | Status: AC
  Filled 2016-05-19: qty 5

## 2016-05-19 MED ORDER — PHENOL 1.4 % MT LIQD: 1.0000 | OROMUCOSAL | Status: DC | PRN

## 2016-05-19 MED ORDER — CEFAZOLIN SODIUM 1-5 GM-% IV SOLN
1.0000 g | Freq: Four times a day (QID) | INTRAVENOUS | Status: AC
  Administered 2016-05-19 – 2016-05-20 (×2): 1 g via INTRAVENOUS
  Filled 2016-05-19 (×2): qty 50

## 2016-05-19 MED ORDER — OXYCODONE HCL 5 MG/5ML PO SOLN: 5.0000 mg | Freq: Once | ORAL | Status: DC | PRN

## 2016-05-19 MED ORDER — MIDAZOLAM HCL 5 MG/5ML IJ SOLN
INTRAMUSCULAR | Status: DC | PRN
  Administered 2016-05-19: 2 mg via INTRAVENOUS

## 2016-05-19 MED ORDER — METOPROLOL TARTRATE 12.5 MG HALF TABLET
12.5000 mg | ORAL_TABLET | Freq: Two times a day (BID) | ORAL | Status: DC
  Administered 2016-05-20 – 2016-05-22 (×4): 12.5 mg via ORAL
  Filled 2016-05-19 (×6): qty 1

## 2016-05-19 MED ORDER — METHOCARBAMOL 1000 MG/10ML IJ SOLN
500.0000 mg | Freq: Four times a day (QID) | INTRAVENOUS | Status: DC | PRN
  Filled 2016-05-19: qty 5

## 2016-05-19 MED ORDER — ONDANSETRON HCL 4 MG/2ML IJ SOLN
INTRAMUSCULAR | Status: AC
  Filled 2016-05-19: qty 2

## 2016-05-19 MED ORDER — LACTATED RINGERS IV SOLN
INTRAVENOUS | Status: DC | PRN
  Administered 2016-05-19 (×2): via INTRAVENOUS

## 2016-05-19 MED ORDER — METHOCARBAMOL 500 MG PO TABS
500.0000 mg | ORAL_TABLET | Freq: Four times a day (QID) | ORAL | Status: DC | PRN
  Administered 2016-05-19 – 2016-05-22 (×6): 500 mg via ORAL
  Filled 2016-05-19 (×6): qty 1

## 2016-05-19 MED ORDER — METOCLOPRAMIDE HCL 5 MG PO TABS: 5.0000 mg | ORAL_TABLET | Freq: Three times a day (TID) | ORAL | Status: DC | PRN

## 2016-05-19 MED ORDER — PROPOFOL 10 MG/ML IV BOLUS
INTRAVENOUS | Status: DC | PRN
  Administered 2016-05-19: 30 mg via INTRAVENOUS

## 2016-05-19 MED ORDER — OXYCODONE HCL 5 MG PO TABS: 5.0000 mg | ORAL_TABLET | Freq: Once | ORAL | Status: DC | PRN

## 2016-05-19 MED ORDER — HYDROMORPHONE HCL 1 MG/ML IJ SOLN
INTRAMUSCULAR | Status: AC
  Filled 2016-05-19: qty 1

## 2016-05-19 MED ORDER — CEFAZOLIN SODIUM-DEXTROSE 2-4 GM/100ML-% IV SOLN
2.0000 g | INTRAVENOUS | Status: AC
  Administered 2016-05-19: 2 g via INTRAVENOUS

## 2016-05-19 MED ORDER — CHLORHEXIDINE GLUCONATE 4 % EX LIQD: 60.0000 mL | Freq: Once | CUTANEOUS | Status: DC

## 2016-05-19 MED ORDER — LACTATED RINGERS IV SOLN
INTRAVENOUS | Status: DC
  Administered 2016-05-19: 11:00:00 via INTRAVENOUS

## 2016-05-19 MED ORDER — NITROGLYCERIN 0.4 MG SL SUBL: 0.4000 mg | SUBLINGUAL_TABLET | SUBLINGUAL | Status: DC | PRN

## 2016-05-19 MED ORDER — ZOLPIDEM TARTRATE 5 MG PO TABS: 5.0000 mg | ORAL_TABLET | Freq: Every evening | ORAL | Status: DC | PRN

## 2016-05-19 MED ORDER — FENTANYL CITRATE (PF) 100 MCG/2ML IJ SOLN
INTRAMUSCULAR | Status: DC | PRN
  Administered 2016-05-19 (×2): 50 ug via INTRAVENOUS

## 2016-05-19 MED ORDER — HYDROMORPHONE HCL 1 MG/ML IJ SOLN
0.2500 mg | INTRAMUSCULAR | Status: DC | PRN
  Administered 2016-05-19 (×4): 0.5 mg via INTRAVENOUS

## 2016-05-19 MED ORDER — PHENYLEPHRINE 40 MCG/ML (10ML) SYRINGE FOR IV PUSH (FOR BLOOD PRESSURE SUPPORT)
PREFILLED_SYRINGE | INTRAVENOUS | Status: AC
  Filled 2016-05-19: qty 10

## 2016-05-19 MED ORDER — QUETIAPINE FUMARATE 25 MG PO TABS
25.0000 mg | ORAL_TABLET | Freq: Every day | ORAL | Status: DC
  Administered 2016-05-19 – 2016-05-21 (×3): 25 mg via ORAL
  Filled 2016-05-19 (×3): qty 1

## 2016-05-19 MED ORDER — METOCLOPRAMIDE HCL 5 MG/ML IJ SOLN: 5.0000 mg | Freq: Three times a day (TID) | INTRAMUSCULAR | Status: DC | PRN

## 2016-05-19 MED ORDER — ALUM & MAG HYDROXIDE-SIMETH 200-200-20 MG/5ML PO SUSP: 30.0000 mL | ORAL | Status: DC | PRN

## 2016-05-19 MED ORDER — ONDANSETRON HCL 4 MG/2ML IJ SOLN
INTRAMUSCULAR | Status: DC | PRN
  Administered 2016-05-19: 4 mg via INTRAVENOUS

## 2016-05-19 MED ORDER — PROPOFOL 10 MG/ML IV BOLUS
INTRAVENOUS | Status: AC
  Filled 2016-05-19: qty 20

## 2016-05-19 MED ORDER — MIDAZOLAM HCL 2 MG/2ML IJ SOLN
INTRAMUSCULAR | Status: AC
  Filled 2016-05-19: qty 2

## 2016-05-19 MED ORDER — CEFAZOLIN SODIUM-DEXTROSE 2-4 GM/100ML-% IV SOLN
INTRAVENOUS | Status: AC
  Filled 2016-05-19: qty 100

## 2016-05-19 MED ORDER — HYDROMORPHONE HCL 1 MG/ML IJ SOLN
1.0000 mg | INTRAMUSCULAR | Status: DC | PRN
  Administered 2016-05-19 – 2016-05-22 (×9): 1 mg via INTRAVENOUS
  Filled 2016-05-19 (×9): qty 1

## 2016-05-19 MED ORDER — DIPHENHYDRAMINE HCL 12.5 MG/5ML PO ELIX: 12.5000 mg | ORAL_SOLUTION | ORAL | Status: DC | PRN

## 2016-05-19 MED ORDER — SODIUM CHLORIDE 0.9 % IV SOLN
INTRAVENOUS | Status: DC
  Administered 2016-05-19: 15:00:00 via INTRAVENOUS

## 2016-05-19 MED ORDER — OXYCODONE HCL 5 MG PO TABS
5.0000 mg | ORAL_TABLET | ORAL | Status: DC | PRN
  Administered 2016-05-19 – 2016-05-20 (×3): 15 mg via ORAL
  Administered 2016-05-20 (×2): 10 mg via ORAL
  Administered 2016-05-20: 5 mg via ORAL
  Administered 2016-05-20 – 2016-05-22 (×8): 15 mg via ORAL
  Filled 2016-05-19 (×7): qty 3
  Filled 2016-05-19: qty 2
  Filled 2016-05-19 (×5): qty 3

## 2016-05-19 MED ORDER — PHENYLEPHRINE HCL 10 MG/ML IJ SOLN
INTRAMUSCULAR | Status: DC | PRN
  Administered 2016-05-19 (×3): 80 ug via INTRAVENOUS

## 2016-05-19 MED ORDER — ONDANSETRON HCL 4 MG/2ML IJ SOLN
4.0000 mg | Freq: Four times a day (QID) | INTRAMUSCULAR | Status: DC | PRN
  Administered 2016-05-20: 4 mg via INTRAVENOUS
  Filled 2016-05-19: qty 2

## 2016-05-19 MED ORDER — ASPIRIN EC 325 MG PO TBEC
325.0000 mg | DELAYED_RELEASE_TABLET | Freq: Two times a day (BID) | ORAL | Status: DC
  Administered 2016-05-19 – 2016-05-22 (×6): 325 mg via ORAL
  Filled 2016-05-19 (×6): qty 1

## 2016-05-19 MED ORDER — DOCUSATE SODIUM 100 MG PO CAPS
100.0000 mg | ORAL_CAPSULE | Freq: Two times a day (BID) | ORAL | Status: DC
  Administered 2016-05-19 – 2016-05-22 (×6): 100 mg via ORAL
  Filled 2016-05-19 (×6): qty 1

## 2016-05-19 MED ORDER — ACETAMINOPHEN 650 MG RE SUPP: 650.0000 mg | Freq: Four times a day (QID) | RECTAL | Status: DC | PRN

## 2016-05-19 MED ORDER — FENTANYL CITRATE (PF) 250 MCG/5ML IJ SOLN
INTRAMUSCULAR | Status: AC
  Filled 2016-05-19: qty 5

## 2016-05-19 MED ORDER — POLYETHYLENE GLYCOL 3350 17 G PO PACK: 17.0000 g | PACK | Freq: Every day | ORAL | Status: DC | PRN

## 2016-05-19 MED ORDER — LISINOPRIL 5 MG PO TABS
5.0000 mg | ORAL_TABLET | Freq: Every day | ORAL | Status: DC
  Administered 2016-05-19: 5 mg via ORAL
  Filled 2016-05-19: qty 1

## 2016-05-19 MED ORDER — LIDOCAINE HCL (CARDIAC) 20 MG/ML IV SOLN
INTRAVENOUS | Status: DC | PRN
  Administered 2016-05-19: 40 mg via INTRAVENOUS

## 2016-05-19 MED ORDER — PROPOFOL 500 MG/50ML IV EMUL
INTRAVENOUS | Status: DC | PRN
  Administered 2016-05-19: 75 ug/kg/min via INTRAVENOUS

## 2016-05-19 MED ORDER — KETOROLAC TROMETHAMINE 15 MG/ML IJ SOLN
7.5000 mg | Freq: Four times a day (QID) | INTRAMUSCULAR | Status: AC
  Administered 2016-05-19 – 2016-05-20 (×4): 7.5 mg via INTRAVENOUS
  Filled 2016-05-19 (×4): qty 1

## 2016-05-19 MED ORDER — ONDANSETRON HCL 4 MG PO TABS: 4.0000 mg | ORAL_TABLET | Freq: Four times a day (QID) | ORAL | Status: DC | PRN

## 2016-05-19 MED ORDER — ONDANSETRON HCL 4 MG/2ML IJ SOLN: 4.0000 mg | Freq: Once | INTRAMUSCULAR | Status: DC | PRN

## 2016-05-19 SURGICAL SUPPLY — 48 items
BENZOIN TINCTURE PRP APPL 2/3 (GAUZE/BANDAGES/DRESSINGS) ×2 IMPLANT
BLADE SAW SGTL 18X1.27X75 (BLADE) ×2 IMPLANT
BLADE SURG ROTATE 9660 (MISCELLANEOUS) IMPLANT
CAPT HIP TOTAL 2 ×2 IMPLANT
CELLS DAT CNTRL 66122 CELL SVR (MISCELLANEOUS) ×1 IMPLANT
COVER SURGICAL LIGHT HANDLE (MISCELLANEOUS) ×2 IMPLANT
DRAPE C-ARM 42X72 X-RAY (DRAPES) ×2 IMPLANT
DRAPE STERI IOBAN 125X83 (DRAPES) ×2 IMPLANT
DRAPE U-SHAPE 47X51 STRL (DRAPES) ×6 IMPLANT
DRSG AQUACEL AG ADV 3.5X10 (GAUZE/BANDAGES/DRESSINGS) ×2 IMPLANT
DURAPREP 26ML APPLICATOR (WOUND CARE) ×2 IMPLANT
ELECT BLADE 4.0 EZ CLEAN MEGAD (MISCELLANEOUS) ×2
ELECT BLADE 6.5 EXT (BLADE) IMPLANT
ELECT REM PT RETURN 9FT ADLT (ELECTROSURGICAL) ×2
ELECTRODE BLDE 4.0 EZ CLN MEGD (MISCELLANEOUS) ×1 IMPLANT
ELECTRODE REM PT RTRN 9FT ADLT (ELECTROSURGICAL) ×1 IMPLANT
FACESHIELD WRAPAROUND (MASK) ×4 IMPLANT
GLOVE BIOGEL PI IND STRL 8 (GLOVE) ×2 IMPLANT
GLOVE BIOGEL PI INDICATOR 8 (GLOVE) ×2
GLOVE ECLIPSE 8.0 STRL XLNG CF (GLOVE) ×2 IMPLANT
GLOVE ORTHO TXT STRL SZ7.5 (GLOVE) ×4 IMPLANT
GOWN STRL REUS W/ TWL LRG LVL3 (GOWN DISPOSABLE) ×2 IMPLANT
GOWN STRL REUS W/ TWL XL LVL3 (GOWN DISPOSABLE) ×2 IMPLANT
GOWN STRL REUS W/TWL LRG LVL3 (GOWN DISPOSABLE) ×2
GOWN STRL REUS W/TWL XL LVL3 (GOWN DISPOSABLE) ×2
HANDPIECE INTERPULSE COAX TIP (DISPOSABLE) ×1
KIT BASIN OR (CUSTOM PROCEDURE TRAY) ×2 IMPLANT
KIT ROOM TURNOVER OR (KITS) ×2 IMPLANT
MANIFOLD NEPTUNE II (INSTRUMENTS) ×2 IMPLANT
NS IRRIG 1000ML POUR BTL (IV SOLUTION) ×2 IMPLANT
PACK TOTAL JOINT (CUSTOM PROCEDURE TRAY) ×2 IMPLANT
PAD ARMBOARD 7.5X6 YLW CONV (MISCELLANEOUS) ×2 IMPLANT
RTRCTR WOUND ALEXIS 18CM MED (MISCELLANEOUS) ×2
SET HNDPC FAN SPRY TIP SCT (DISPOSABLE) ×1 IMPLANT
STAPLER VISISTAT 35W (STAPLE) IMPLANT
STRIP CLOSURE SKIN 1/2X4 (GAUZE/BANDAGES/DRESSINGS) ×4 IMPLANT
SUT ETHIBOND NAB CT1 #1 30IN (SUTURE) ×2 IMPLANT
SUT MNCRL AB 4-0 PS2 18 (SUTURE) IMPLANT
SUT VIC AB 0 CT1 27 (SUTURE) ×2
SUT VIC AB 0 CT1 27XBRD ANBCTR (SUTURE) ×2 IMPLANT
SUT VIC AB 1 CT1 27 (SUTURE) ×2
SUT VIC AB 1 CT1 27XBRD ANBCTR (SUTURE) ×2 IMPLANT
SUT VIC AB 2-0 CT1 27 (SUTURE) ×2
SUT VIC AB 2-0 CT1 TAPERPNT 27 (SUTURE) ×2 IMPLANT
TOWEL OR 17X24 6PK STRL BLUE (TOWEL DISPOSABLE) ×2 IMPLANT
TOWEL OR 17X26 10 PK STRL BLUE (TOWEL DISPOSABLE) ×2 IMPLANT
TRAY FOLEY CATH 16FRSI W/METER (SET/KITS/TRAYS/PACK) ×2 IMPLANT
WATER STERILE IRR 1000ML POUR (IV SOLUTION) ×4 IMPLANT

## 2016-05-19 NOTE — Brief Op Note (Signed)
05/19/2016  1:59 PM  PATIENT:  [name]  60 y.o. male  PRE-OPERATIVE DIAGNOSIS:  avascular necrosis left hip  POST-OPERATIVE DIAGNOSIS:  same  PROCEDURE:  Procedure(s): LEFT TOTAL HIP ARTHROPLASTY ANTERIOR APPROACH (Left)  SURGEON:  Surgeon(s) and Role:    * Kathryne Hitchhristopher Y Mariesa Grieder, MD - Primary  PHYSICIAN ASSISTANT: Rexene EdisonGil Clark, PA-C  ANESTHESIA:   spinal  EBL:  Total I/O In: 1000 [I.V.:1000] Out: -   COUNTS:  YES  DICTATION: .Other Dictation: Dictation Number [id number]  PLAN OF CARE: Admit to inpatient   PATIENT DISPOSITION:  PACU - hemodynamically stable.   Delay start of Pharmacological VTE agent (>24hrs) due to surgical blood loss or risk of bleeding: no

## 2016-05-19 NOTE — Anesthesia Procedure Notes (Signed)
Spinal Patient location during procedure: OR Start time: 05/19/2016 12:20 PM End time: 05/19/2016 12:25 PM Staffing Performed by: anesthesiologist  Preanesthetic Checklist Completed: patient identified, site marked, surgical consent, pre-op evaluation, timeout performed, IV checked, risks and benefits discussed and monitors and equipment checked Spinal Block Patient position: sitting Prep: ChloraPrep Patient monitoring: cardiac monitor, heart rate, continuous pulse ox and blood pressure Approach: right paramedian Location: L3-4 Injection technique: single-shot Needle Needle type: Tuohy  Needle gauge: 22 G Needle length: 9 cm Assessment Sensory level: T6 Additional Notes 10 mg 0.75% Bupivacaine injected easily

## 2016-05-19 NOTE — Anesthesia Postprocedure Evaluation (Signed)
Anesthesia Post Note  Patient: Lucas Weiss  Procedure(s) Performed: Procedure(s) (LRB): LEFT TOTAL HIP ARTHROPLASTY ANTERIOR APPROACH (Left)  Patient location during evaluation: PACU Anesthesia Type: General Level of consciousness: awake, awake and alert and oriented Pain management: pain level controlled Vital Signs Assessment: post-procedure vital signs reviewed and stable Respiratory status: spontaneous breathing, nonlabored ventilation and respiratory function stable Cardiovascular status: blood pressure returned to baseline Anesthetic complications: no    Last Vitals:  Filed Vitals:   05/19/16 1537 05/19/16 1545  BP:  149/91  Pulse:  78  Temp: 36.6 C 36.6 C  Resp:  20    Last Pain:  Filed Vitals:   05/19/16 1620  PainSc: 8                  Trey Bebee COKER

## 2016-05-19 NOTE — Evaluation (Signed)
Physical Therapy Evaluation Patient Details Name: Lucas Weiss MRN: [id number] DOB: 05-26-1956 Today's Date: 05/19/2016   History of Present Illness  Pt is a 60 y/o M s/p Lt THA.  Pt's PMH includes tobacco and alcohol abuse (in remission), hypotension, chronic pain, severe recurrent major depression, CAD, PAD, CABG, CHF, CVT, MI, neuropathy, stroke w/ resultant blind Lt eye.    Clinical Impression  Pt is s/p Lt THA resulting in the deficits listed below (see PT Problem List). Mr. Coles presents w/ severe Lt hip pain (10/10) and declines bed mobility today.  Assisted pt w/ therapeutic exercises.  Pt lives alone w/ steps to enter his home and will have intermittent assist from his sister at d/c.  Currently recommending HHPT, will continue to assess follow up recommendation and make changes as needed, pending pt's progress w/ mobility. Pt will benefit from skilled PT to increase their independence and safety with mobility to allow discharge to the venue listed below.     Follow Up Recommendations Home health PT;Supervision - Intermittent (pending pt's progress)    Equipment Recommendations  Rolling walker with 5" wheels    Recommendations for Other Services OT consult     Precautions / Restrictions Precautions Precautions: Fall Precaution Comments: check formal op note for indication of direct anterior vs. anterior hip precautions Restrictions Weight Bearing Restrictions: Yes LLE Weight Bearing: Weight bearing as tolerated      Mobility  Bed Mobility               General bed mobility comments: Pt refused due to 10/10 pain  Transfers                    Ambulation/Gait                Stairs            Wheelchair Mobility    Modified Rankin (Stroke Patients Only)       Balance                                             Pertinent Vitals/Pain Pain Assessment: 0-10 Pain Score: 10-Worst pain ever Pain Location: Lt hip w/  mobility Pain Descriptors / Indicators: Moaning;Grimacing;Guarding;Aching;Constant Pain Intervention(s): Limited activity within patient's tolerance;Monitored during session;Premedicated before session    Home Living Family/patient expects to be discharged to:: Private residence Living Arrangements: Alone Available Help at Discharge: Family;Available PRN/intermittently Type of Home: Mobile home Home Access: Stairs to enter Entrance Stairs-Rails: Can reach both;Left;Right Entrance Stairs-Number of Steps: 6 Home Layout: One level Home Equipment: Cane - single point Additional Comments: The sister that was supposed to stay w/ him at d/c, her husband is now in the hospital and she is unable to provide assist.  Pt's other sister is coming back into town today and, per pt, will be able to provide intermittent assist/supervision..    Prior Function Level of Independence: Independent with assistive device(s)         Comments: Uses cane at all times because of pain.  Denies h/o falls in the past 6 months.     Hand Dominance   Dominant Hand: Right    Extremity/Trunk Assessment   Upper Extremity Assessment: Defer to OT evaluation           Lower Extremity Assessment: LLE deficits/detail   LLE Deficits / Details: limited ROM and  strength as pt is s/p Lt THA, severe pain today limiting mobility further     Communication   Communication: No difficulties  Cognition Arousal/Alertness: Lethargic;Suspect due to medications Behavior During Therapy: Anxious Overall Cognitive Status: Within Functional Limits for tasks assessed                      General Comments      Exercises Total Joint Exercises Ankle Circles/Pumps: AROM;Both;20 reps;Supine Quad Sets: Strengthening;Both;10 reps;Supine Gluteal Sets: Strengthening;Both;10 reps;Supine Straight Leg Raises: AAROM;Both;10 reps;AROM;Supine General Exercises - Upper Extremity Shoulder Flexion: AROM;Both;10 reps;Supine       Assessment/Plan    PT Assessment Patient needs continued PT services  PT Diagnosis Difficulty walking;Acute pain   PT Problem List Decreased strength;Decreased range of motion;Decreased activity tolerance;Decreased balance;Decreased mobility;Decreased knowledge of use of DME;Decreased safety awareness;Decreased knowledge of precautions;Pain  PT Treatment Interventions DME instruction;Gait training;Stair training;Functional mobility training;Therapeutic activities;Therapeutic exercise;Balance training;Neuromuscular re-education;Patient/family education;Modalities   PT Goals (Current goals can be found in the Care Plan section) Acute Rehab PT Goals Patient Stated Goal: decreased pain PT Goal Formulation: With patient Time For Goal Achievement: 05/19/16 Potential to Achieve Goals: Good    Frequency 7X/week   Barriers to discharge Inaccessible home environment;Decreased caregiver support lives alone w/ steps to enter home and intermittent assist    Co-evaluation               End of Session   Activity Tolerance: Patient limited by pain Patient left: in bed;with call bell/phone within reach;with bed alarm set;with SCD's reapplied Nurse Communication: Mobility status;Patient requests pain meds;Other (comment);Weight bearing status (awaiting for indication on precautions or no precautions)         Time: [id number] PT Time Calculation (min) (ACUTE ONLY): 15 min   Charges:   PT Evaluation $PT Eval Low Complexity: 1 Procedure     PT G Codes:       Encarnacion ChuAshley Mckenna Gamm PT, DPT  Pager: [phone] Phone: [phone] 05/19/2016, 4:46 PM

## 2016-05-19 NOTE — Transfer of Care (Signed)
Immediate Anesthesia Transfer of Care Note  Patient: Mcarthur RossettiSteven M Ludwick  Procedure(s) Performed: Procedure(s): LEFT TOTAL HIP ARTHROPLASTY ANTERIOR APPROACH (Left)  Patient Location: PACU  Anesthesia Type:Spinal  Level of Consciousness: awake  Airway & Oxygen Therapy: Patient Spontanous Breathing and Patient connected to nasal cannula oxygen  Post-op Assessment: Report given to RN and Post -op Vital signs reviewed and stable  Post vital signs: Reviewed and stable  Last Vitals:  Filed Vitals:   05/19/16 1043 05/19/16 1410  BP: 137/113 101/55  Pulse: 75 68  Temp: 36.8 C 36.5 C  Resp: 20 10    Last Pain:  Filed Vitals:   05/19/16 1414  PainSc: 6       Patients Stated Pain Goal: 2 (05/19/16 1034)  Complications: No apparent anesthesia complications

## 2016-05-19 NOTE — Progress Notes (Signed)
Utilization review completed.  

## 2016-05-19 NOTE — H&P (Signed)
TOTAL HIP ADMISSION H&P  Patient is admitted for left total hip arthroplasty.  Subjective:  Chief Complaint: left hip pain  HPI: Lucas Weiss, 60 y.o. male, has a history of pain and functional disability in the left hip(s) due to avascular necrosis and patient has failed non-surgical conservative treatments for greater than 12 weeks to include NSAID's and/or analgesics, flexibility and strengthening excercises, use of assistive devices and activity modification.  Onset of symptoms was gradual starting 5 years ago with rapidlly worsening course since that time.The patient noted no past surgery on the left hip(s).  Patient currently rates pain in the left hip at 10 out of 10 with activity. Patient has night pain, worsening of pain with activity and weight bearing, trendelenberg gait, pain that interfers with activities of daily living and pain with passive range of motion. Patient has evidence of subchondral cysts, subchondral sclerosis, periarticular osteophytes and joint space narrowing by imaging studies. This condition presents safety issues increasing the risk of falls.  There is no current active infection.  Patient Active Problem List   Diagnosis Date Noted  . Diet-controlled diabetes mellitus (HCC) 01/09/2016  . Tobacco abuse, in remission 01/09/2016  . Hypotension 08/15/2015  . Severe recurrent major depression (HCC) 06/14/2015  . Chronic pain 06/14/2015  . Alcohol abuse, in remission   . Avascular necrosis of bone of left hip (HCC)   . Atherosclerotic PVD with intermittent claudication (HCC) 10/25/2014  . Renal artery stenosis, native, bilateral (HCC) 02/20/2013  . CAD (coronary artery disease) 09/16/2012  . PAD (peripheral artery disease): Left common iliac stent plus additional stent(09/15/12) for ISRS. 09/16/2012  . S/P CABG x 5: 2009: LIMA to LAD, SVG to Diag, Seq SVG to OM2-3, SVG to PDA 09/16/2012  . Hypertensive heart failure (HCC) 09/16/2012  . HLD (hyperlipidemia)  09/16/2012   Past Medical History  Diagnosis Date  . Coronary artery disease   . Dyslexia   . PAD (peripheral artery disease) (HCC)     07/2012:  right ABI 0.64, left ABI 0.57.  Marland Kitchen Hypertension   . High cholesterol   . Impaired fasting glucose     Last A1c 6.1  . Bleeding hemorrhoids   . H/O hiatal hernia   . Severe recurrent major depression without psychotic features (HCC)   . Tobacco abuse     Quit in 2009  . Renal artery stenosis (HCC)     Bilateral   . CHF (congestive heart failure) (HCC)   . DVT (deep venous thrombosis) (HCC)   . Myocardial infarction (HCC)   . Alcohol abuse, in remission   . Avascular necrosis of bone of left hip (HCC) 10/15    Total hip replacement recommended by ortho, vascular says it's OK  . Neuropathy (HCC)   . Primary osteoarthritis of left hip   . Chronic low back pain   . Diverticulosis of sigmoid colon   . Diabetes mellitus without complication (HCC) 01/09/16    A1c 6.8  . Complication of anesthesia     "woke up twice during procedures @ [place]" (09/15/2012)  . Shortness of breath dyspnea     with exertion - due to pain  . Pneumonia     history of   . Stroke [facility]) 2009    "after CABG; ~ blind left eye since" (09/15/2012)- memory loss   . Head injury, acute, with loss of consciousness [facility])     as a child    Past Surgical History  Procedure Laterality Date  . Peripheral  arterial stent graft  06/20/2012; ~ 07/2012; 09/15/2012    left; left; left  . Cardiac catheterization  ?2009  . Colonoscopy w/ polypectomy      "no cancer"  . Pv angiogram  09/15/12    restenting of left common iliac for ISRS.  Bilateral occluded SFAs.  Renal artery stenosis: 70% right inferior renal artery, 90% left inferior renal artery stenosis.   . 2d echo  02/17/11    EF 50-55%, mild LVH, No WMA, mild MR, mild TR  . Cardiovascular stress test  08/10/12    Lexiscan: EF 70%, Attenuation artifact in inferior region.  No ischemia or infarct in remaining myocardium. Low risk   . Lower extremity angiogram N/A 09/15/2012    Procedure: LOWER EXTREMITY ANGIOGRAM;  Surgeon: Runell GessJonathan J Berry, MD;  [facility];  Service: Cardiovascular;  Laterality: N/A;  . Insertion of iliac stent Left 09/15/2012    Procedure: INSERTION OF ILIAC STENT;  Surgeon: Runell GessJonathan J Berry, MD;  [facility];  Service: Cardiovascular;  Laterality: Left;  lt iliac stent  . Coronary artery bypass graft  2009    2009, CABG X5, LIMA to LAD, SVG to Diagnonal, Sequential SVG to OM 2-3, SVG to PDA; Dr. Laneta SimmersBartle    Prescriptions prior to admission  Medication Sig Dispense Refill Last Dose  . clopidogrel (PLAVIX) 75 MG tablet Take 1 tablet (75 mg total) by mouth daily. 90 tablet 1 Taking  . lisinopril (PRINIVIL,ZESTRIL) 5 MG tablet Take 1 tablet (5 mg total) by mouth daily. 90 tablet 1 Past Week at Unknown time  . lovastatin (MEVACOR) 20 MG tablet Take 2 tablets (40 mg total) by mouth at bedtime. 180 tablet 1 Past Week at Unknown time  . metoprolol tartrate (LOPRESSOR) 25 MG tablet Take 1 tablet by mouth every morning and 1/2 tablets at night (Patient taking differently: Take 12.5-25 mg by mouth 2 (two) times daily. Take 1 tablet by mouth every morning and 1/2 tablets at night) 135 tablet 1 05/19/2016 at 0900  . nitroGLYCERIN (NITROSTAT) 0.4 MG SL tablet Place 1 tablet (0.4 mg total) under the tongue every 5 (five) minutes as needed for chest pain. 30 tablet 1 Taking  . OVER THE COUNTER MEDICATION Take 1 tablet by mouth daily as needed. OTC Laxative.   Past Week at Unknown time  . oxyCODONE-acetaminophen (PERCOCET) 10-325 MG tablet Take 1 tablet by mouth every 6 (six) hours as needed. For pain 120 tablet 0 05/19/2016 at 0900  . QUEtiapine (SEROQUEL) 25 MG tablet Take 1 tablet (25 mg total) by mouth at bedtime. 30 tablet 6 Past Week at Unknown time  . citalopram (CELEXA) 40 MG tablet Take 1.5 tablets (60 mg total) by mouth daily. (Patient not taking: Reported on 05/08/2016) 135 tablet 1 Taking  .  docusate sodium (COLACE) 100 MG capsule Take 1 capsule (100 mg total) by mouth 2 (two) times daily. (Patient not taking: Reported on 05/08/2016) 60 capsule 3 Taking   No Known Allergies  Social History  Substance Use Topics  . Smoking status: Former Smoker -- 1.50 packs/day for 30 years    Types: Cigarettes    Quit date: 07/19/2008  . Smokeless tobacco: Never Used  . Alcohol Use: 6.0 oz/week    8 Cans of beer, 2 Shots of liquor per week     Comment: Former Alcoholic, not drinking anymore. 09/15/2012 "3-4 beers; shot of crown @ least 2 nights/wk"    Family History  Problem Relation Age of Onset  .  Diabetes Mother   . Heart disease Mother   . Hyperlipidemia Mother   . Hypertension Mother   . Heart attack Mother   . Thyroid disease Mother   . Hyperlipidemia Father   . Hypertension Father   . Heart attack Father   . Heart disease Father     before age 68  . Alcohol abuse Father   . Lung disease Father   . Diabetes Sister   . Hyperlipidemia Sister   . Hypertension Sister   . Cancer Sister   . Heart disease Brother     before age 30  . Hyperlipidemia Brother   . Hypertension Brother   . Heart attack Brother   . Diabetes Brother   . Heart disease Brother   . Hyperlipidemia Brother   . Hypertension Brother   . Stroke Brother   . Heart attack Brother   . Heart disease Sister   . Hypertension Sister   . Hyperlipidemia Sister      Review of Systems  Musculoskeletal: Positive for back pain and joint pain.  All other systems reviewed and are negative.   Objective:  Physical Exam  Constitutional: He is oriented to person, place, and time. He appears well-developed and well-nourished.  HENT:  Head: Normocephalic and atraumatic.  Eyes: EOM are normal. Pupils are equal, round, and reactive to light.  Neck: Normal range of motion. Neck supple.  Cardiovascular: Normal rate and regular rhythm.   Respiratory: Effort normal and breath sounds normal.  GI: Soft. Bowel sounds are  normal.  Musculoskeletal:       Left hip: He exhibits decreased range of motion, decreased strength, tenderness, bony tenderness and swelling.  Neurological: He is alert and oriented to person, place, and time.  Skin: Skin is warm and dry.  Psychiatric: He has a normal mood and affect.    Vital signs in last 24 hours: Temp:  [98.2 F (36.8 C)] 98.2 F (36.8 C) (05/30 1043) Pulse Rate:  [75] 75 (05/30 1043) Resp:  [20] 20 (05/30 1043) BP: (137)/(113) 137/113 mmHg (05/30 1043) SpO2:  [100 %] 100 % (05/30 1043) Weight:  [86.183 kg (190 lb)-86.229 kg (190 lb 1.6 oz)] 86.183 kg (190 lb) (05/30 1043)  Labs:   Estimated body mass index is 27.26 kg/(m^2) as calculated from the following:   Height as of this encounter:  (1.778 m).   Weight as of this encounter: 86.183 kg (190 lb).   Imaging Review Plain radiographs demonstrate severe idiopathic avascular necrosis of the left hip(s). The bone quality appears to be good for age and reported activity level.  Assessment/Plan:  End stage AVN, left hip(s)  The patient history, physical examination, clinical judgement of the provider and imaging studies are consistent with end stage avascular necrosis of the left hip(s) and total hip arthroplasty is deemed medically necessary. The treatment options including medical management, injection therapy, arthroscopy and arthroplasty were discussed at length. The risks and benefits of total hip arthroplasty were presented and reviewed. The risks due to aseptic loosening, infection, stiffness, dislocation/subluxation,  thromboembolic complications and other imponderables were discussed.  The patient acknowledged the explanation, agreed to proceed with the plan and consent was signed. Patient is being admitted for inpatient treatment for surgery, pain control, PT, OT, prophylactic antibiotics, VTE prophylaxis, progressive ambulation and ADL's and discharge planning.The patient is planning to be  discharged home with home health services

## 2016-05-19 NOTE — Op Note (Signed)
NAMVivia Budge:  Rzasa, Shahir                 ACCOUNT NO.:  [id number]  MEDICAL RECORD NO.:  [id number]  LOCATION:  [id number]                        FACILITY:  [facility]  PHYSICIAN:  Vanita PandaChristopher Y. Magnus IvanBlackman, M.D.DATE OF BIRTH:  January 16, 1956  DATE OF PROCEDURE:  05/19/2016 DATE OF DISCHARGE:                              OPERATIVE REPORT   PREOPERATIVE DIAGNOSIS:  End-stage avascular necrosis, left hip.  POSTOPERATIVE DIAGNOSIS:  End-stage avascular necrosis, left hip.  PROCEDURE:  Left total hip arthroplasty through direct anterior approach.  IMPLANTS:  DePuy Sector Gription acetabular component size 54, size 36+ 4 neutral polyethylene liner, size 12 Corail femoral component with standard offset, size 36+ 5 ceramic hip ball.  SURGEON:  Vanita PandaChristopher Y. Magnus IvanBlackman, M.D.  ASSISTANT:  Richardean CanalGilbert Clark, PA-C.  ANESTHESIA:  Spinal.  ANTIBIOTICS:  2 g of IV Ancef.  BLOOD LOSS:  Less than 500 mL.  COMPLICATIONS:  None.  INDICATIONS:  Mr. Blenda MountsMize is a 60 year old gentleman with known avascular necrosis involving his left hip.  This is likely from alcohol use in the past, this certainly can be idiopathic.  His x-rays show cystic changes in the femoral head and the acetabulum, and he also has some collapse of the femoral head.  At this point, it is daily and it is detrimentally affected his activities of daily living, his quality of life and his mobility.  A total hip arthroplasty has been recommended for him.  He understands the risk of acute blood loss anemia, nerve and vessel injury, fracture, infection, dislocation, DVT.  He understands our goals are decreased pain, improved mobility, and overall improved quality of life.  PROCEDURE DESCRIPTION:  After informed consent was obtained, appropriate left hip was marked.  He was brought to the operating room, spinal anesthesia was obtained while he was on a stretcher.  Traction boots were placed on both of his feet.  Next, he was placed supine on the  Hana fracture table with the perineal post in place and both legs in inline skeletal traction devices, but no traction applied.  His left operative hip was then prepped and draped with DuraPrep and sterile drapes.  A time-out was called and he was identified as correct patient and correct left hip.  I then made an incision inferior and posterior to the anterior superior iliac spine and carried this obliquely down the leg. We dissected down the tensor fascia lata muscle and the tensor fascia was then divided longitudinally, so we could proceed with a direct anterior approach to the hip.  We identified and cauterized the circumflex vessels and then identified the hip capsule.  We opened up the hip capsule in an L-type format finding a moderate joint effusion. We placed Cobra retractors around the medial and lateral femoral neck and then made our femoral neck cut with an oscillating saw proximal to the lesser trochanter and completed this with an osteotome.  I placed a corkscrew guide in the femoral head and removed the femoral head in its entirety and found wide areas of avascular necrosis with flaking of his large portion of his cartilage on the femoral head and collapse.  We then cleaned the acetabulum and remnants of the acetabular labrum and  other debris and then began reaming under direct visualization from a size 42 reamer up to a size 54 with all reamers under direct visualization and the last reamer under direct fluoroscopy, so we could obtain our depth of reaming, our inclination, and anteversion.  Once we were pleased with this, we placed the real DePuy Sector Gription acetabular component size 54, and a 36+ 4 neutral polyethylene liner for that size acetabular component.  Attention was then turned to the femur. With the leg externally rotated to 120 degrees, extended and adducted, we were able to place a Mueller retractor medially and a Hohmann retractor behind the greater  trochanter.  We released the lateral joint capsule and used a box cutting osteotome to the inner femoral canal and a rongeur to lateralize.  We then began broaching from a size 8 broach to a size 12.  With the size 12 intact and being proud due to his leg being somewhat short, we trialed a 36+ 1.5 hip ball.  We brought the leg back over and up with traction and internal rotation, reducing the pelvis.  We were pleased with stability, but felt like we need a little more offset and leg length.  We then dislocated the hip and removed the trial components.  We were able to place the real Corail femoral component size 12 and the real 36+ 5 ceramic hip ball and reduced this in the acetabulum and we were pleased with tightness, leg length, offset and range of motion.  We then irrigated the soft tissue with normal saline solution using pulsatile lavage.  We were able to close the joint capsule with interrupted #1 Ethibond suture followed by running #1 Vicryl in the tensor fascia, 0 Vicryl in the deep tissue, 2-0 Vicryl in the subcutaneous tissue and staples on the skin.  Xeroform and Aquacel dressing were applied and he was taken off the Hana table, a Foley catheter was placed and he was taken to the recovery room in stable condition.  All final counts were correct.  There were no complications noted.  Of note, Richardean Canal, PA-C assisted in the entire case, his assistance was crucial for facilitating all aspects of this case.     Vanita Panda. Magnus Ivan, M.D.     CYB/MEDQ  D:  05/19/2016  T:  05/19/2016  Job:  [id number]

## 2016-05-19 NOTE — Anesthesia Preprocedure Evaluation (Signed)
Anesthesia Evaluation  Patient identified by MRN, date of birth, ID band Patient awake    Reviewed: Allergy & Precautions, NPO status , Patient's Chart, lab work & pertinent test results  Airway Mallampati: II  TM Distance: >3 FB Neck ROM: Full    Dental  (+) Teeth Intact, Dental Advisory Given   Pulmonary former smoker,    breath sounds clear to auscultation       Cardiovascular hypertension,  Rhythm:Regular Rate:Normal     Neuro/Psych    GI/Hepatic   Endo/Other  diabetes  Renal/GU      Musculoskeletal   Abdominal   Peds  Hematology   Anesthesia Other Findings   Reproductive/Obstetrics                             Anesthesia Physical Anesthesia Plan  ASA: III  Anesthesia Plan: MAC and Spinal   Post-op Pain Management:    Induction: Intravenous  Airway Management Planned: Natural Airway and Nasal Cannula  Additional Equipment:   Intra-op Plan:   Post-operative Plan:   Informed Consent: I have reviewed the patients History and Physical, chart, labs and discussed the procedure including the risks, benefits and alternatives for the proposed anesthesia with the patient or authorized representative who has indicated his/her understanding and acceptance.   Dental advisory given  Plan Discussed with: CRNA and Anesthesiologist  Anesthesia Plan Comments: (Plan SAB)        Anesthesia Quick Evaluation

## 2016-05-20 DIAGNOSIS — M87052 Idiopathic aseptic necrosis of left femur: Secondary | ICD-10-CM | POA: Diagnosis not present

## 2016-05-20 LAB — CBC
HCT: 27 % — ABNORMAL LOW (ref 39.0–52.0)
Hemoglobin: 8.8 g/dL — ABNORMAL LOW (ref 13.0–17.0)
MCH: 27.8 pg (ref 26.0–34.0)
MCHC: 32.6 g/dL (ref 30.0–36.0)
MCV: 85.2 fL (ref 78.0–100.0)
Platelets: 147 K/uL — ABNORMAL LOW (ref 150–400)
RBC: 3.17 MIL/uL — ABNORMAL LOW (ref 4.22–5.81)
RDW: 14.3 % (ref 11.5–15.5)
WBC: 5.6 K/uL (ref 4.0–10.5)

## 2016-05-20 LAB — BASIC METABOLIC PANEL
Anion gap: 6 (ref 5–15)
BUN: 19 mg/dL (ref 6–20)
CALCIUM: 7.9 mg/dL — AB (ref 8.9–10.3)
CO2: 23 mmol/L (ref 22–32)
CREATININE: 1.5 mg/dL — AB (ref 0.61–1.24)
Chloride: 105 mmol/L (ref 101–111)
GFR calc Af Amer: 58 mL/min — ABNORMAL LOW (ref >60)
GFR, EST NON AFRICAN AMERICAN: 50 mL/min — AB (ref >60)
GLUCOSE: 159 mg/dL — AB (ref 65–99)
Potassium: 4.2 mmol/L (ref 3.5–5.1)
Sodium: 134 mmol/L — ABNORMAL LOW (ref 135–145)

## 2016-05-20 NOTE — Progress Notes (Signed)
PT Cancellation Note  Patient Details Name: Lucas Weiss MRN: [id number] DOB: 09-18-1956   Cancelled Treatment:    Reason Eval/Treat Not Completed: Pain limiting ability to participate. Pt declining PT at this time due to 8/10 pain. Pt was educated on benefits of OOB and mobility however pt wanting to wait until pain medication "kicks in" (had received prior to PT entering). Will check back as schedule allows to continue PT POC.    Conni SlipperKirkman, Lucas Weiss 05/20/2016, 2:11 PM  Conni SlipperLaura [name], PT, DPT Acute Rehabilitation Services Pager: [phone]

## 2016-05-20 NOTE — Care Management Important Message (Signed)
Important Message  Patient Details  Name: Mcarthur RossettiSteven M Emrich MRN: [id number] Date of Birth: 05/14/1956   Medicare Important Message Given:  Yes    Bernadette HoitShoffner, Fatin Bachicha Coleman 05/20/2016, 9:26 AM

## 2016-05-20 NOTE — Evaluation (Signed)
Occupational Therapy Evaluation Patient Details Name: Lucas RossettiSteven M Weiss MRN: [id number] DOB: 1956/01/31 Today's Date: 05/20/2016    History of Present Illness Pt is a 60 y/o M s/p Lt THA.  Pt's PMH includes tobacco and alcohol abuse (in remission), hypotension, chronic pain, severe recurrent major depression, CAD, PAD, CABG, CHF, CVT, MI, neuropathy, stroke w/ resultant blind Lt eye.   Clinical Impression   Pt was admitted for the above.  Currently, he needs up to max A for LB dressing and min A for SPTs.  He will benefit from continued OT to increase safety and independence with adls.  Goals in acute are for min guard level    Follow Up Recommendations  Supervision/Assistance - 24 hour and HHOT;SNF (vs  Pt may not have 24/7--doesn't want SNF)    Equipment Recommendations  3 in 1 bedside comode    Recommendations for Other Services       Precautions / Restrictions Precautions Precautions: Fall Restrictions Weight Bearing Restrictions: Yes LLE Weight Bearing: Weight bearing as tolerated      Mobility Bed Mobility Overal bed mobility: Needs Assistance Bed Mobility: Supine to Sit     Supine to sit: Mod assist;HOB elevated     General bed mobility comments: assist for legs and trunk (went around back to assist him), Max A to scoot forward to EOB  Transfers Overall transfer level: Needs assistance Equipment used: Rolling walker (2 wheeled) Transfers: Sit to/from UGI CorporationStand;Stand Pivot Transfers Sit to Stand: Min assist;From elevated surface Stand pivot transfers: Min assist       General transfer comment: assist to rise and stabilize. Cues for UE/LE placement    Balance                                            ADL Overall ADL's : Needs assistance/impaired     Grooming: Set up;Sitting   Upper Body Bathing: Set up;Sitting   Lower Body Bathing: Moderate assistance;Sit to/from stand   Upper Body Dressing : Minimal assistance;Sitting (lines)    Lower Body Dressing: Maximal assistance;Sit to/from stand   Toilet Transfer: Minimal assistance;Stand-pivot;RW Nurse, children's(recliner)   Toileting- Clothing Manipulation and Hygiene: Moderate assistance;Sit to/from stand         General ADL Comments: pt limited weight bearing on LLE; did not place foot completely on floor.  Educated on [place] and working within pain tolerance.  Pt needed extensive assistance to get OOB.  He is working out assistance but does not think he will have 24/7.  Pt has several reachers at home:  educated on use for adls, but did not practice this     Vision     Perception     Praxis      Pertinent Vitals/Pain Pain Assessment: Faces Faces Pain Scale: Hurts even more Pain Location: L hip Pain Descriptors / Indicators: Grimacing Pain Intervention(s): Limited activity within patient's tolerance;Monitored during session;Premedicated before session;Repositioned;Ice applied     Hand Dominance Right   Extremity/Trunk Assessment Upper Extremity Assessment Upper Extremity Assessment: Overall WFL for tasks assessed           Communication Communication Communication: No difficulties   Cognition Arousal/Alertness: Awake/alert Behavior During Therapy: WFL for tasks assessed/performed Overall Cognitive Status: No family/caregiver present to determine baseline cognitive functioning (decreased safety:  cues needed)  General Comments       Exercises       Shoulder Instructions      Home Living Family/patient expects to be discharged to:: Private residence Living Arrangements: Alone Available Help at Discharge: Family;Available PRN/intermittently Type of Home: Mobile home             Bathroom Shower/Tub: Walk-in shower   Bathroom Toilet: Standard     Home Equipment: Cane - single point   Additional Comments: The sister that was supposed to stay w/ him at d/c, her husband is now in the hospital and she is unable to provide  assist.  Pt's other sister is coming back into town today and, per pt, will be able to provide intermittent assist/supervision..Pt has an 50 year old granddaughter who assisted him after CVA      Prior Functioning/Environment Level of Independence: Independent with assistive device(s)        Comments: Uses cane at all times because of pain.  Denies h/o falls in the past 6 months.    OT Diagnosis: Acute pain;Generalized weakness   OT Problem List: Decreased strength;Decreased activity tolerance;Decreased safety awareness;Pain;Decreased knowledge of use of DME or AE   OT Treatment/Interventions: Self-care/ADL training;DME and/or AE instruction;Patient/family education;Therapeutic activities    OT Goals(Current goals can be found in the care plan section) Acute Rehab OT Goals Patient Stated Goal: decreased pain OT Goal Formulation: With patient Time For Goal Achievement: 05/27/16 Potential to Achieve Goals: Good ADL Goals Pt Will Perform Lower Body Bathing: with min guard assist;with adaptive equipment;sit to/from stand Pt Will Perform Lower Body Dressing: with min guard assist;with adaptive equipment;sit to/from stand (pants and reacher) Pt Will Transfer to Toilet: with min guard assist;ambulating;bedside commode Pt Will Perform Toileting - Clothing Manipulation and hygiene: with min guard assist;sit to/from stand Pt Will Perform Tub/Shower Transfer: Shower transfer;with min guard assist;ambulating (vs verbalizing technique/sequence) Additional ADL Goal #1: pt will not need any cues for safety during adls/bathroom transfers  OT Frequency: Min 2X/week   Barriers to D/C: Decreased caregiver support          Co-evaluation              End of Session    Activity Tolerance: Patient limited by fatigue Patient left: in chair;with call bell/phone within reach;with chair alarm set   Time: 1000-1037 OT Time Calculation (min): 37 min Charges:  OT General Charges $OT Visit: 1  Procedure OT Evaluation $OT Eval Moderate Complexity: 1 Procedure OT Treatments $Self Care/Home Management : 8-22 mins G-Codes:    Lucas Weiss 06/13/16, 10:57 AM  Lucas Weiss, OTR/L [phone] Jun 13, 2016

## 2016-05-20 NOTE — Progress Notes (Signed)
Subjective: 1 Day Post-Op Procedure(s) (LRB): LEFT TOTAL HIP ARTHROPLASTY ANTERIOR APPROACH (Left) Patient reports pain as moderate.   Acute blood loss anemia from surgery.  Objective: Vital signs in last 24 hours: Temp:  [97.7 F (36.5 C)-102.2 F (39 C)] 99.7 F (37.6 C) (05/31 40980632) Pulse Rate:  [68-116] 101 (05/31 0632) Resp:  [10-22] 18 (05/30 2044) BP: (98-149)/(50-113) 103/55 mmHg (05/31 0512) SpO2:  [93 %-100 %] 96 % (05/31 0512) Weight:  [86.183 kg (190 lb)-86.229 kg (190 lb 1.6 oz)] 86.183 kg (190 lb) (05/30 1043)  Intake/Output from previous day: 05/30 0701 - 05/31 0700 In: 2640 [P.O.:240; I.V.:2400] Out: 775 [Urine:700; Blood:75] Intake/Output this shift:     Recent Labs  05/20/16 0549  HGB 8.8*    Recent Labs  05/20/16 0549  WBC 5.6  RBC 3.17*  HCT 27.0*  PLT 147*    Recent Labs  05/20/16 0549  NA 134*  K 4.2  CL 105  CO2 23  BUN 19  CREATININE 1.50*  GLUCOSE 159*  CALCIUM 7.9*   No results for input(s): LABPT, INR in the last 72 hours.  Sensation intact distally Intact pulses distally Dorsiflexion/Plantar flexion intact Incision: scant drainage  Assessment/Plan: 1 Day Post-Op Procedure(s) (LRB): LEFT TOTAL HIP ARTHROPLASTY ANTERIOR APPROACH (Left) Up with therapy  BLACKMAN,CHRISTOPHER Y 05/20/2016, 7:06 AM

## 2016-05-21 ENCOUNTER — Encounter (HOSPITAL_COMMUNITY): Payer: Self-pay | Admitting: Orthopaedic Surgery

## 2016-05-21 LAB — BASIC METABOLIC PANEL
Anion gap: 7 (ref 5–15)
BUN: 20 mg/dL (ref 6–20)
CHLORIDE: 104 mmol/L (ref 101–111)
CO2: 20 mmol/L — ABNORMAL LOW (ref 22–32)
Calcium: 8.6 mg/dL — ABNORMAL LOW (ref 8.9–10.3)
Creatinine, Ser: 1.3 mg/dL — ABNORMAL HIGH (ref 0.61–1.24)
GFR calc Af Amer: >60 mL/min (ref >60)
GFR calc non Af Amer: 59 mL/min — ABNORMAL LOW (ref >60)
Glucose, Bld: 143 mg/dL — ABNORMAL HIGH (ref 65–99)
POTASSIUM: 4.7 mmol/L (ref 3.5–5.1)
SODIUM: 131 mmol/L — AB (ref 135–145)

## 2016-05-21 LAB — CBC
HCT: 27.1 % — ABNORMAL LOW (ref 39.0–52.0)
HEMOGLOBIN: 8.8 g/dL — AB (ref 13.0–17.0)
MCH: 27.6 pg (ref 26.0–34.0)
MCHC: 32.5 g/dL (ref 30.0–36.0)
MCV: 85 fL (ref 78.0–100.0)
Platelets: 150 10*3/uL (ref 150–400)
RBC: 3.19 MIL/uL — AB (ref 4.22–5.81)
RDW: 14.5 % (ref 11.5–15.5)
WBC: 8.3 10*3/uL (ref 4.0–10.5)

## 2016-05-21 NOTE — Progress Notes (Signed)
Physical Therapy Treatment Patient Details Name: Lucas Weiss MRN: [id number] DOB: [birth year] Today's Date: 05/21/2016    History of Present Illness Pt is a 60 y/o M s/p Lt THA.  Pt's PMH includes tobacco and alcohol abuse (in remission), hypotension, chronic pain, severe recurrent major depression, CAD, PAD, CABG, CHF, CVT, MI, neuropathy, stroke w/ resultant blind Lt eye.    PT Comments    Pt able to increase his ambulation distance to 20 feet with rw. Initially pt reporting that he was unable to advance his LLE to ambulate and was dragging his LLE. While distracted the patient was noted to perform unrestricted LLE swing with heel-toe pattern. Pt also note to shift his weight and load the LLE in standing to stretch his RLE by fully lifting and swinging his leg.  Pt reporting that his daughter is going to stay with him upon D/C now. While discussing plans to attempt stairs prior to D/C the pt stated that he would have his daughter carry him up the steps. We discussed that this is not a safe option and that we would work on steps prior to D/C.   Follow Up Recommendations  Home health PT;Supervision for mobility/OOB     Equipment Recommendations  Rolling walker with 5" wheels    Recommendations for Other Services       Precautions / Restrictions Precautions Precautions: Fall Restrictions Weight Bearing Restrictions: Yes LLE Weight Bearing: Weight bearing as tolerated    Mobility  Bed Mobility               General bed mobility comments: up in chair upon arrival (states he wants to stay in the chair)  Transfers Overall transfer level: Needs assistance Equipment used: Rolling walker (2 wheeled) Transfers: Sit to/from Stand Sit to Stand: Min guard         General transfer comment: pt able to stand with min guard given time.  Ambulation/Gait Ambulation/Gait assistance: Min guard Ambulation Distance (Feet): 20 Feet Assistive device: Rolling walker (2 wheeled) Gait  Pattern/deviations: Step-to pattern;Step-through pattern Gait velocity: very slow   General Gait Details: Pt presenting with an inconsistent gait pattern. Pt dragging Lt toes and reports being unable to move LLE forward but when distracted pt able to actively swing leg and demonstrate heel-toe pattern. Pt also with decreased weightbearing throught the LLE but reported having a cramp in his Rt LE and pt able to fully shift weight onto LLE to stretch Rt LE. Gait distance limited by pt reports of increased pain and being unable to walk any further.    Stairs Stairs:  (Pt states he will have his daughter carry him up.)          Wheelchair Mobility    Modified Rankin (Stroke Patients Only)       Balance Overall balance assessment: Needs assistance Sitting-balance support: No upper extremity supported Sitting balance-Leahy Scale: Good     Standing balance support: Bilateral upper extremity supported Standing balance-Leahy Scale: Poor Standing balance comment: using rw                    Cognition Arousal/Alertness: Awake/alert Behavior During Therapy: WFL for tasks assessed/performed Overall Cognitive Status: No family/caregiver present to determine baseline cognitive functioning Area of Impairment: Safety/judgement         Safety/Judgement: Decreased awareness of safety;Decreased awareness of deficits     General Comments: poor awareness of need to mobilize and strategy for stairs.     Exercises  General Comments        Pertinent Vitals/Pain Pain Assessment: Faces Pain Score: 8  Faces Pain Scale: Hurts little more Pain Location: LLE  Pain Descriptors / Indicators: Guarding Pain Intervention(s): Limited activity within patient's tolerance;Monitored during session;Ice applied    Home Living                      Prior Function            PT Goals (current goals can now be found in the care plan section) Acute Rehab PT Goals Patient  Stated Goal: not expressed PT Goal Formulation: With patient Time For Goal Achievement: 06/04/16 Potential to Achieve Goals: Fair Progress towards PT goals: Progressing toward goals    Frequency  7X/week    PT Plan Discharge plan needs to be updated    Co-evaluation             End of Session Equipment Utilized During Treatment: Gait belt Activity Tolerance: Patient limited by pain Patient left: in chair;with call bell/phone within reach;with chair alarm set     Time: 6962-9528 PT Time Calculation (min) (ACUTE ONLY): 21 min  Charges:  $Gait Training: 8-22 mins                    G Codes:      Christiane Ha, PT, CSCS Pager [phone] Office [phone]  05/21/2016, 3:50 PM

## 2016-05-21 NOTE — Progress Notes (Signed)
Dr. Magnus IvanBlackman in to see pt and encourages incentive spirometry as previously discussed with pt. Pt acknowledges understanding.

## 2016-05-21 NOTE — Progress Notes (Signed)
Call out to Richardean CanalGilbert Clark, [place] refgarding oral temp of 101.4.

## 2016-05-21 NOTE — Progress Notes (Signed)
Physical Therapy Treatment Patient Details Name: Lucas Weiss MRN: [id number] DOB: 04/22/1956 Today's Date: 05/21/2016    History of Present Illness Pt is a 60 y/o M s/p Lt THA.  Pt's PMH includes tobacco and alcohol abuse (in remission), hypotension, chronic pain, severe recurrent major depression, CAD, PAD, CABG, CHF, CVT, MI, neuropathy, stroke w/ resultant blind Lt eye.    PT Comments    At this time the patient is progressing very slowly with PT. Heavy encouragement is needed for participation (refused PT yesterday). Discussed the need for mobilization with the patient and he verbalized understanding. Pt did ambulate 5 feet with rw but refused any further distance despite strong encouragement. Pt states that he is planning to D/C to home but he is unable to specify any help that he will have, saying only that he has family around. Based upon the patient's current mobility, the patient may need SNF prior to D/C home. PT to continue to follow and stressed to pt the needed to mobilize.   Follow Up Recommendations  SNF;Supervision for mobility/OOB     Equipment Recommendations  Rolling walker with 5" wheels    Recommendations for Other Services       Precautions / Restrictions Precautions Precautions: Fall Restrictions Weight Bearing Restrictions: Yes LLE Weight Bearing: Weight bearing as tolerated    Mobility  Bed Mobility               General bed mobility comments: up in chair upon arrival  Transfers Overall transfer level: Needs assistance Equipment used: Rolling walker (2 wheeled) Transfers: Sit to/from Stand Sit to Stand: Min assist         General transfer comment: cues for hand placement.   Ambulation/Gait Ambulation/Gait assistance: Min assist;Min guard Ambulation Distance (Feet): 5 Feet Assistive device: Rolling walker (2 wheeled) Gait Pattern/deviations: Step-to pattern Gait velocity: very slow   General Gait Details: Pt moving very slowly and  heavy encouragement needed for attempting to ambulate. Pt at times moving LLE independently and other times requesting assist to advance.    Stairs            Wheelchair Mobility    Modified Rankin (Stroke Patients Only)       Balance Overall balance assessment: Needs assistance Sitting-balance support: No upper extremity supported Sitting balance-Leahy Scale: Fair     Standing balance support: Bilateral upper extremity supported Standing balance-Leahy Scale: Poor Standing balance comment: using rw for support                    Cognition Arousal/Alertness: Awake/alert Behavior During Therapy: WFL for tasks assessed/performed Overall Cognitive Status: No family/caregiver present to determine baseline cognitive functioning                      Exercises Total Joint Exercises Ankle Circles/Pumps: AROM;Both;20 reps;Supine Quad Sets: Strengthening;Left;10 reps Short Arc Quad: Strengthening;Left;10 reps (min assist) Heel Slides: AAROM;Left;10 reps (mod assist)    General Comments        Pertinent Vitals/Pain Pain Assessment: Faces Faces Pain Scale: Hurts even more Pain Location: Lt LE Pain Descriptors / Indicators: Grimacing Pain Intervention(s): Monitored during session    Home Living                      Prior Function            PT Goals (current goals can now be found in the care plan section) Acute Rehab PT Goals Patient  Stated Goal: decreased pain PT Goal Formulation: With patient Time For Goal Achievement: 06/04/16 Potential to Achieve Goals: Fair Progress towards PT goals: Progressing toward goals    Frequency  7X/week    PT Plan Discharge plan needs to be updated    Co-evaluation             End of Session Equipment Utilized During Treatment: Gait belt Activity Tolerance: Patient tolerated treatment well Patient left: in chair;with call bell/phone within reach;with chair alarm set     Time: [id number] PT  Time Calculation (min) (ACUTE ONLY): 23 min  Charges:  $Gait Training: 8-22 mins $Therapeutic Exercise: 8-22 mins                    G Codes:      Christiane HaBenjamin J. Aryianna Earwood, PT, CSCS Pager [phone] Office [phone]  05/21/2016, 8:43 AM

## 2016-05-21 NOTE — Progress Notes (Signed)
Occupational Therapy Treatment Patient Details Name: Mcarthur RossettiSteven M Buehrer MRN: [id number] DOB: February 19, 1956 Today's Date: 05/21/2016    History of present illness Pt is a 60 y/o M s/p Lt THA.  Pt's PMH includes tobacco and alcohol abuse (in remission), hypotension, chronic pain, severe recurrent major depression, CAD, PAD, CABG, CHF, CVT, MI, neuropathy, stroke w/ resultant blind Lt eye.   OT comments  Pt markedly improved today.  Much safer, but still needs min A for transfers and steadying when ambulating.    Follow Up Recommendations  Supervision/Assistance - 24 hour;Home health OT (pt states daughter will stay with him)    Equipment Recommendations  3 in 1 bedside comode    Recommendations for Other Services      Precautions / Restrictions Precautions Precautions: Fall Restrictions LLE Weight Bearing: Weight bearing as tolerated       Mobility Bed Mobility               General bed mobility comments: up in chair upon arrival  Transfers   Equipment used: Rolling walker (2 wheeled) Transfers: Sit to/from Stand Sit to Stand: Min assist         General transfer comment: light assistance to stand and cues for UE's.  Pt feels like he needs LLE under him to stand    Balance                                   ADL                           Toilet Transfer: Minimal assistance;Ambulation;BSC;RW             General ADL Comments: pt moving better.  Stood 3x's to stretch LLE.  Has difficulty lifting L foot and advancing, but able to do so with extra time and a couple of small steps to get foot placed where he needs to be. Educated on Armed forces technical officerreacher and donned pillowcase to simulate pants; he has this at home      Vision                     Perception     Praxis      Cognition   Behavior During Therapy: [facility] for tasks assessed/performed Overall Cognitive Status: Within Functional Limits for tasks assessed                        Extremity/Trunk Assessment               Exercises     Shoulder Instructions       General Comments      Pertinent Vitals/ Pain       Pain Assessment: 0-10 Pain Score: 8  Pain Location: LLE Pain Descriptors / Indicators: Aching Pain Intervention(s): Limited activity within patient's tolerance;Monitored during session;Repositioned;Ice applied (declined requesting pain medication)  Home Living                                          Prior Functioning/Environment              Frequency Min 2X/week     Progress Toward Goals  OT Goals(current goals can now be found in the care plan section)  Progress towards OT  goals: Progressing toward goals  Acute Rehab OT Goals Patient Stated Goal: decreased pain  Plan      Co-evaluation                 End of Session     Activity Tolerance Patient tolerated treatment well   Patient Left in chair;with call bell/phone within reach;with chair alarm set   Nurse Communication          Time: 1610-9604 OT Time Calculation (min): 20 min  Charges: OT General Charges $OT Visit: 1 Procedure OT Treatments $Self Care/Home Management : 8-22 mins  Tessia Kassin 05/21/2016, 2:59 PM  Marica Otter, OTR/L [phone] 05/21/2016

## 2016-05-21 NOTE — Care Management Note (Signed)
Case Management Note  Patient Details  Name: Mcarthur RossettiSteven M Surace MRN: [id number] Date of Birth: 1956/01/06  Subjective/Objective:                S/p left total hip arthroplasty    Action/Plan[facility]. Spoke with patient about discharge plan, he stated that he was planning to stay with his sister but her husband had a MI last Friday and is still in the hospital, so he will not be able to stay with her. He stated that he does not know of anyone else whom he can stay with or that can stay with him. I explained that based on his progress with PT they are recommending SNF. I explained difference in therapy at home vs SNF. He stated that he would rather go home and will try to find someone to assist him. Explained that he will need to make progress for home to be a safe discharge. Informed CSW of patient possible need for SNF. Will continue to follow for discharge needs.      Expected Discharge Date:                  Expected Discharge Plan:     In-House Referral:  Clinical Social Work  Discharge planning Services  CM Consult  Post Acute Care Choice:  Durable Medical Equipment, Home Health Choice offered to:  Patient  DME Arranged:    DME Agency:     HH Arranged:  PT [facility]  Status of Service:  In process, will continue to follow  Medicare Important Message Given:  Yes Date Medicare IM Given:    Medicare IM give by:    Date Additional Medicare IM Given:    Additional Medicare Important Message give by:     If discussed at Long Length of Stay Meetings, dates discussed:    Additional Comments:  Monica BectonKrieg, Sophira Rumler Watson, RN 05/21/2016, 12:02 PM

## 2016-05-21 NOTE — Progress Notes (Signed)
Patient ID: Mcarthur RossettiSteven M Perdew, male   DOB: 04-25-1956, 60 y.o.   MRN: [id number] Mr. Blenda MountsMize would rather go home than to skilled nursing.  Will discharge to home tomorrow.

## 2016-05-21 NOTE — Progress Notes (Signed)
Subjective: 2 Days Post-Op Procedure(s) (LRB): LEFT TOTAL HIP ARTHROPLASTY ANTERIOR APPROACH (Left) Patient reports pain as moderate.  Complaining of swelling in left thigh. No SOB, Chest pain or dizziness .  Objective: Vital signs in last 24 hours: Temp:  [98.7 F (37.1 C)-102.3 F (39.1 C)] 99.6 F (37.6 C) (06/01 0627) Pulse Rate:  [93-110] 93 (06/01 0627) Resp:  [16-18] 16 (06/01 0500) BP: (98-127)/(54-57) 108/57 mmHg (06/01 0627) SpO2:  [94 %-95 %] 95 % (06/01 0627)  Intake/Output from previous day: 05/31 0701 - 06/01 0700 In: 1380 [P.O.:480; I.V.:900] Out: 1000 [Urine:1000] Intake/Output this shift: Total I/O In: 240 [P.O.:240] Out: -    Recent Labs  05/20/16 0549 05/21/16 0500  HGB 8.8* 8.8*    Recent Labs  05/20/16 0549 05/21/16 0500  WBC 5.6 8.3  RBC 3.17* 3.19*  HCT 27.0* 27.1*  PLT 147* 150    Recent Labs  05/20/16 0549 05/21/16 0500  NA 134* 131*  K 4.2 4.7  CL 105 104  CO2 23 20*  BUN 19 20  CREATININE 1.50* 1.30*  GLUCOSE 159* 143*  CALCIUM 7.9* 8.6*   No results for input(s): LABPT, INR in the last 72 hours.  Sensation intact distally Intact pulses distally Dorsiflexion/Plantar flexion intact Incision: scant drainage  Calf supple non tender Left thigh tenderness and mild edema  Assessment/Plan: 2 Days Post-Op Procedure(s) (LRB): LEFT TOTAL HIP ARTHROPLASTY ANTERIOR APPROACH (Left) Up with therapy  Monitor Hgb and for symptoms of anemia  Encouraged mobilization    Lucas Weiss 05/21/2016, 11:53 AM

## 2016-05-21 NOTE — [place] FL2 (Signed)
[place] MEDICAID FL2 LEVEL OF CARE SCREENING TOOL     IDENTIFICATION  Patient Name: Lucas Weiss Birthdate: December 30, 1955 Sex: male Admission Date (Current Location): 05/19/2016  [facility] [place]   Facility and Address:  [facility], [address], [place], [place] [address]      Provider Number: [id number]  Attending Physician Name and Address:  Kathryne Hitchhristopher Y Blackman, *  Relative Name and Phone Number:       Current Level of Care: Hospital Recommended Level of Care: Skilled Nursing Facility Prior Approval Number:    Date Approved/Denied:   PASRR Number: [id number]  Discharge Plan: SNF    Current Diagnoses: Patient Active Problem List   Diagnosis Date Noted  . Status post total replacement of left hip 05/19/2016  . Diet-controlled diabetes mellitus (HCC) 01/09/2016  . Tobacco abuse, in remission 01/09/2016  . Hypotension 08/15/2015  . Severe recurrent major depression (HCC) 06/14/2015  . Chronic pain 06/14/2015  . Alcohol abuse, in remission   . Avascular necrosis of bone of left hip (HCC)   . Atherosclerotic PVD with intermittent claudication (HCC) 10/25/2014  . Renal artery stenosis, native, bilateral (HCC) 02/20/2013  . CAD (coronary artery disease) 09/16/2012  . PAD (peripheral artery disease): Left common iliac stent plus additional stent(09/15/12) for ISRS. 09/16/2012  . S/P CABG x 5: 2009: LIMA to LAD, SVG to Diag, Seq SVG to OM2-3, SVG to PDA 09/16/2012  . Hypertensive heart failure (HCC) 09/16/2012  . HLD (hyperlipidemia) 09/16/2012    Orientation RESPIRATION BLADDER Height & Weight     Time, Situation, Place, Self  Normal Continent Weight: 190 lb (86.183 kg) Height:  5\' 10"  (177.8 cm)  BEHAVIORAL SYMPTOMS/MOOD NEUROLOGICAL BOWEL NUTRITION STATUS      Continent Diet (Please see discharge summary.)  AMBULATORY STATUS COMMUNICATION OF NEEDS Skin   Extensive Assist Verbally Surgical wounds                      Personal Care Assistance Level of Assistance  Bathing, Feeding, Dressing Bathing Assistance: Limited assistance Feeding assistance: Independent Dressing Assistance: Limited assistance     Functional Limitations Info             SPECIAL CARE FACTORS FREQUENCY  PT (By licensed PT), OT (By licensed OT)     PT Frequency: 5 OT Frequency: 5            Contractures      Additional Factors Info  Code Status, Allergies Code Status Info: Not on file Allergies Info: No known allergies           Current Medications (05/21/2016):  This is the current hospital active medication list Current Facility-Administered Medications  Medication Dose Route Frequency Provider Last Rate Last Dose  . 0.9 %  sodium chloride infusion   Intravenous Continuous Kathryne Hitchhristopher Y Blackman, MD 75 mL/hr at 05/19/16 1455    . acetaminophen (TYLENOL) tablet 650 mg  650 mg Oral Q6H PRN Kathryne Hitchhristopher Y Blackman, MD   650 mg at 05/21/16 0507   Or  . acetaminophen (TYLENOL) suppository 650 mg  650 mg Rectal Q6H PRN Kathryne Hitchhristopher Y Blackman, MD      . alum & mag hydroxide-simeth (MAALOX/MYLANTA) 200-200-20 MG/5ML suspension 30 mL  30 mL Oral Q4H PRN Kathryne Hitchhristopher Y Blackman, MD      . aspirin EC tablet 325 mg  325 mg Oral BID PC Kathryne Hitchhristopher Y Blackman, MD   325 mg at 05/21/16 0843  .  diphenhydrAMINE (BENADRYL) 12.5 MG/5ML elixir 12.5-25 mg  12.5-25 mg Oral Q4H PRN Kathryne Hitch, MD      . docusate sodium (COLACE) capsule 100 mg  100 mg Oral BID Kathryne Hitch, MD   100 mg at 05/21/16 0843  . HYDROmorphone (DILAUDID) injection 1 mg  1 mg Intravenous Q2H PRN Kathryne Hitch, MD   1 mg at 05/21/16 0421  . menthol-cetylpyridinium (CEPACOL) lozenge 3 mg  1 lozenge Oral PRN Kathryne Hitch, MD       Or  . phenol (CHLORASEPTIC) mouth spray 1 spray  1 spray Mouth/Throat PRN Kathryne Hitch, MD      . methocarbamol (ROBAXIN) tablet 500 mg  500 mg Oral Q6H PRN Kathryne Hitch, MD   500 mg at 05/21/16 0507   Or  . methocarbamol (ROBAXIN) 500 mg in dextrose 5 % 50 mL IVPB  500 mg Intravenous Q6H PRN Kathryne Hitch, MD      . metoCLOPramide (REGLAN) tablet 5-10 mg  5-10 mg Oral Q8H PRN Kathryne Hitch, MD       Or  . metoCLOPramide (REGLAN) injection 5-10 mg  5-10 mg Intravenous Q8H PRN Kathryne Hitch, MD      . metoprolol tartrate (LOPRESSOR) tablet 12.5 mg  12.5 mg Oral BID Kathryne Hitch, MD   12.5 mg at 05/21/16 0843  . nitroGLYCERIN (NITROSTAT) SL tablet 0.4 mg  0.4 mg Sublingual Q5 min PRN Kathryne Hitch, MD      . ondansetron [facility]) tablet 4 mg  4 mg Oral Q6H PRN Kathryne Hitch, MD       Or  . ondansetron [facility]) injection 4 mg  4 mg Intravenous Q6H PRN Kathryne Hitch, MD   4 mg at 05/20/16 1717  . oxyCODONE (Oxy IR/ROXICODONE) immediate release tablet 5-15 mg  5-15 mg Oral Q3H PRN Kathryne Hitch, MD   15 mg at 05/21/16 1135  . polyethylene glycol (MIRALAX / GLYCOLAX) packet 17 g  17 g Oral Daily PRN Kathryne Hitch, MD      . QUEtiapine (SEROQUEL) tablet 25 mg  25 mg Oral QHS Kathryne Hitch, MD   25 mg at 05/20/16 2117  . zolpidem (AMBIEN) tablet 5 mg  5 mg Oral QHS PRN,MR X 1 Kathryne Hitch, MD         Discharge Medications: Please see discharge summary for a list of discharge medications.  Relevant Imaging Results:  Relevant Lab Results:   Additional Information SSN: [id number]  Rod Mae, LCSW

## 2016-05-21 NOTE — Progress Notes (Signed)
Patient instructed on full use of incentive spirometry and the importance of its use. Pt verbalized understanding and will utilize. Will monitor.

## 2016-05-22 ENCOUNTER — Encounter: Payer: Self-pay | Admitting: Vascular Surgery

## 2016-05-22 MED ORDER — METHOCARBAMOL 500 MG PO TABS: 500.0000 mg | ORAL_TABLET | Freq: Four times a day (QID) | ORAL | Status: DC | PRN

## 2016-05-22 MED ORDER — ASPIRIN 325 MG PO TBEC: 325.0000 mg | DELAYED_RELEASE_TABLET | Freq: Two times a day (BID) | ORAL | Status: DC

## 2016-05-22 MED ORDER — HYDROMORPHONE HCL 4 MG PO TABS: 4.0000 mg | ORAL_TABLET | ORAL | Status: DC | PRN

## 2016-05-22 NOTE — Clinical Social Work Note (Signed)
Patient to discharge home with assistance from patient's daughter. LCSW signing off.  Lucas Deistmily Mailani Degroote, LCSW [phone] Orthopedics: [phone] Surgical: [phone]

## 2016-05-22 NOTE — Progress Notes (Signed)
Occupational Therapy Treatment Patient Details Name: Lucas Weiss MRN: [id number] DOB: 12/15/1956 Today's Date: 05/22/2016    History of present illness Pt is a 60 y/o M s/p Lt THA.  Pt's PMH includes tobacco and alcohol abuse (in remission), hypotension, chronic pain, severe recurrent major depression, CAD, PAD, CABG, CHF, CVT, MI, neuropathy, stroke w/ resultant blind Lt eye.   OT comments  Pt. Able to complete shower stall transfer and  this session. Requires max encouragement for participation but demonstrates safe techniques.  Note d/c later for today.    Follow Up Recommendations  Supervision/Assistance - 24 hour;Home health OT    Equipment Recommendations  3 in 1 bedside comode    Recommendations for Other Services      Precautions / Restrictions Precautions Precautions: Fall Restrictions Weight Bearing Restrictions: Yes LLE Weight Bearing: Weight bearing as tolerated       Mobility Bed Mobility Overal bed mobility: Needs Assistance Bed Mobility: Supine to Sit     Supine to sit: Min guard Sit to supine: Mod assist   General bed mobility comments: hob flat, no rails, exits on L side.  min guard and inst. for bringing trunk upright, mod a to guide LLE into bed.  pt. very argumentative with removal of height of bed, rails. "what you are not going to help me" reviewed he needed to learn and feel comfortable with the techniques.  "thats what my dtr. amber is for" reviewed she may not always be there and he needs to know he could completed bed mobility safely.   Transfers Overall transfer level: Needs assistance Equipment used: Rolling walker (2 wheeled) Transfers: Sit to/from [employer] Sit to Stand: Supervision Stand pivot transfers: Min guard       General transfer comment: Good technique, standing to stretch prior to ambulating    Balance Overall balance assessment: Needs assistance Sitting-balance support: No upper extremity supported Sitting  balance-Leahy Scale: Good     Standing balance support: During functional activity Standing balance-Leahy Scale: Fair Standing balance comment: pt letting go of walker to move arms, no assist needed to maintain balance.                    ADL Overall ADL's : Needs assistance/impaired                                 Tub/ Shower Transfer: Walk-in shower;Min guard;Anterior/posterior;Ambulation;Rolling walker;Cueing for sequencing;Cueing for safety     General ADL Comments: able to complete shower stall transfer with max encouragement.  pt. continued to complain that our ledge was over 2 inches higher than his.  explained ours was designed to mimic most showers and if he could clear this ledge he would be okay for his home.        Vision                     Perception     Praxis      Cognition   Behavior During Therapy: Agitated Overall Cognitive Status: Within Functional Limits for tasks assessed                  General Comments: lots of complaining and negative comments     Extremity/Trunk Assessment               Exercises Total Joint Exercises Ankle Circles/Pumps: AROM;Both;20 reps;Supine Quad Sets: Strengthening;Left;10 reps Heel Slides: Strengthening;Left;10  reps;Seated   Shoulder Instructions       General Comments      Pertinent Vitals/ Pain       Pain Assessment: No/denies pain Pain Score: 10-Worst pain ever Pain Location: LLE Pain Descriptors / Indicators: Aching;Sharp;Sore Pain Intervention(s): Monitored during session;Limited activity within patient's tolerance  Home Living                                          Prior Functioning/Environment              Frequency Min 2X/week     Progress Toward Goals  OT Goals(current goals can now be found in the care plan section)  Progress towards OT goals: Progressing toward goals  Acute Rehab OT Goals Patient Stated Goal: not have any  pain  Plan Discharge plan remains appropriate    Co-evaluation                 End of Session Equipment Utilized During Treatment: Gait belt;Rolling walker   Activity Tolerance Patient tolerated treatment well   Patient Left in bed;with call bell/phone within reach;with bed alarm set   Nurse Communication          Time: [id number] OT Time Calculation (min): 15 min  Charges: OT General Charges $OT Visit: 1 Procedure OT Treatments $Self Care/Home Management : 8-22 mins  Robet LeuMorris, Tameika Heckmann [place] , COTA/L 05/22/2016, 9:56 AM

## 2016-05-22 NOTE — Progress Notes (Signed)
Subjective: 3 Days Post-Op Procedure(s) (LRB): LEFT TOTAL HIP ARTHROPLASTY ANTERIOR APPROACH (Left) Patient reports pain as moderate.  Very slow progress with therapy.  Refusing SNF.  Objective: Vital signs in last 24 hours: Temp:  [98.2 F (36.8 C)-101.8 F (38.8 C)] 98.2 F (36.8 C) (06/02 04540638) Pulse Rate:  [94-111] 111 (06/02 0423) Resp:  [16-18] 18 (06/02 0423) BP: (111-160)/(52-62) 160/62 mmHg (06/02 0423) SpO2:  [95 %-100 %] 100 % (06/02 0423)  Intake/Output from previous day: 06/01 0701 - 06/02 0700 In: 1470 [P.O.:720; I.V.:750] Out: 1200 [Urine:1200] Intake/Output this shift: Total I/O In: 990 [P.O.:240; I.V.:750] Out: 1200 [Urine:1200]   Recent Labs  05/20/16 0549 05/21/16 0500  HGB 8.8* 8.8*    Recent Labs  05/20/16 0549 05/21/16 0500  WBC 5.6 8.3  RBC 3.17* 3.19*  HCT 27.0* 27.1*  PLT 147* 150    Recent Labs  05/20/16 0549 05/21/16 0500  NA 134* 131*  K 4.2 4.7  CL 105 104  CO2 23 20*  BUN 19 20  CREATININE 1.50* 1.30*  GLUCOSE 159* 143*  CALCIUM 7.9* 8.6*   No results for input(s): LABPT, INR in the last 72 hours.  Sensation intact distally Intact pulses distally Dorsiflexion/Plantar flexion intact Incision: dressing C/D/I  Assessment/Plan: 3 Days Post-Op Procedure(s) (LRB): LEFT TOTAL HIP ARTHROPLASTY ANTERIOR APPROACH (Left) Up with therapy Discharge home with home health today.  Kathryne HitchBLACKMAN,Cecilia Nishikawa Y 05/22/2016, 6:47 AM

## 2016-05-22 NOTE — Discharge Instructions (Signed)

## 2016-05-22 NOTE — Care Management Important Message (Signed)
Important Message  Patient Details  Name: Lucas Weiss MRN: [id number] Date of Birth: February 19, 1956   Medicare Important Message Given:  Yes    Bernadette HoitShoffner, Clemma Johnsen Coleman 05/22/2016, 10:03 AM

## 2016-05-22 NOTE — Progress Notes (Signed)
Pt progressing with PT regarding mobility. Following session, pt denies any questions or concerns. Anticipate pt will D/C to home when medically stable.    05/22/16 1521  PT Visit Information  Last PT Received On 05/22/16  Assistance Needed +1  History of Present Illness Pt is a 60 y/o M s/p Lt THA.  Pt's PMH includes tobacco and alcohol abuse (in remission), hypotension, chronic pain, severe recurrent major depression, CAD, PAD, CABG, CHF, CVT, MI, neuropathy, stroke w/ resultant blind Lt eye.  Subjective Data  Subjective Pt confirms that his daughter is going to be staying with him. Reports that he wants to go home.   Patient Stated Goal not have any pain  Precautions  Precautions Fall  Restrictions  Weight Bearing Restrictions Yes  LLE Weight Bearing WBAT  Pain Assessment  Pain Assessment Faces  Faces Pain Scale 4  Pain Location LLE  Pain Descriptors / Indicators Guarding  Pain Intervention(s) Limited activity within patient's tolerance;Monitored during session  Cognition  Arousal/Alertness Awake/alert  Behavior During Therapy WFL for tasks assessed/performed  Overall Cognitive Status Within Functional Limits for tasks assessed  Bed Mobility  Overal bed mobility Needs Assistance  Bed Mobility Supine to Sit  Supine to sit Independent  General bed mobility comments bed flat no use of rail  Transfers  Overall transfer level Needs assistance  Equipment used Rolling walker (2 wheeled)  Transfers Sit to/from Stand  Sit to Stand Supervision  General transfer comment safe technique used  Ambulation/Gait  Ambulation/Gait assistance Min guard  Ambulation Distance (Feet) 75 Feet  Assistive device Rolling walker (2 wheeled)  Gait Pattern/deviations Step-through pattern;Decreased step length - left  General Gait Details no instability or loss of balance  Gait velocity decreased  Stairs Yes  Stairs assistance Min guard  Stair Management One rail Left;Step to pattern;Alternating  pattern  Number of Stairs 5  General stair comments Pt alternating between step-to and alternating sequence. Cues provided and pt verbalized understanding.   Balance  Overall balance assessment Needs assistance  Sitting-balance support No upper extremity supported  Sitting balance-Leahy Scale Good  Standing balance support During functional activity  Standing balance-Leahy Scale Fair  Standing balance comment using rw  PT - End of Session  Equipment Utilized During Treatment Gait belt  Activity Tolerance Patient limited by pain  Patient left in chair;with call bell/phone within reach  Nurse Communication Mobility status;Patient requests pain meds;Other (comment);Weight bearing status  PT - Assessment/Plan  PT Plan Current plan remains appropriate  PT Frequency (ACUTE ONLY) 7X/week  Follow Up Recommendations Home health PT;Supervision for mobility/OOB  PT equipment Rolling walker with 5" wheels  PT Goal Progression  Progress towards PT goals Progressing toward goals  Acute Rehab PT Goals  PT Goal Formulation With patient  Time For Goal Achievement 06/04/16  Potential to Achieve Goals Fair  PT Time Calculation  PT Start Time (ACUTE ONLY) 1240  PT Stop Time (ACUTE ONLY) 1301  PT Time Calculation (min) (ACUTE ONLY) 21 min  PT General Charges  $$ ACUTE PT VISIT 1 Procedure  PT Treatments  $Gait Training 8-22 mins  Christiane HaBenjamin J. Sue Fernicola, PT, CSCS Pager [phone] Office [phone]

## 2016-05-22 NOTE — Progress Notes (Signed)
Physical Therapy Treatment Patient Details Name: Lucas Weiss MRN: [id number] DOB: 02/02/1956 Today's Date: 05/22/2016    History of Present Illness Pt is a 59 y/o M s/p Lt THA.  Pt's PMH includes tobacco and alcohol abuse (in remission), hypotension, chronic pain, severe recurrent major depression, CAD, PAD, CABG, CHF, CVT, MI, neuropathy, stroke w/ resultant blind Lt eye.    PT Comments    Pt continues to progress slowly with PT but able to increase his ambulation distance to 90 feet. Pt taking multiple standing breaks. Pt reporting that he is wanting to go home and declined going to SNF. Will need to trial stairs prior to D/C, pt request resting prior to attempt.   Follow Up Recommendations  Home health PT;Supervision for mobility/OOB     Equipment Recommendations  Rolling walker with 5" wheels    Recommendations for Other Services       Precautions / Restrictions Precautions Precautions: Fall Restrictions Weight Bearing Restrictions: Yes LLE Weight Bearing: Weight bearing as tolerated    Mobility  Bed Mobility Overal bed mobility: Needs Assistance Bed Mobility: Sit to Supine       Sit to supine: Mod assist   General bed mobility comments: Mod assist needed with bilateral LEs. Pt reports that his daughter will be able to help him at home.   Transfers Overall transfer level: Needs assistance Equipment used: Rolling walker (2 wheeled) Transfers: Sit to/from Stand Sit to Stand: Supervision         General transfer comment: Good technique, standing to stretch prior to ambulating  Ambulation/Gait Ambulation/Gait assistance: Min guard Ambulation Distance (Feet): 90 Feet Assistive device: Rolling walker (2 wheeled) Gait Pattern/deviations: Step-through pattern;Decreased weight shift to left;Decreased stance time - left;Decreased dorsiflexion - left Gait velocity: very slow   General Gait Details: Pt with improved swing phase, no assist needed and not dragging  toes. Pt taking 3 standing breaks during ambulation due to reports of Rt LE cramps.    Stairs Stairs:  (declined at this time, requests attempting later)          Wheelchair Mobility    Modified Rankin (Stroke Patients Only)       Balance Overall balance assessment: Needs assistance Sitting-balance support: No upper extremity supported Sitting balance-Leahy Scale: Good     Standing balance support: During functional activity Standing balance-Leahy Scale: Fair Standing balance comment: pt letting go of walker to move arms, no assist needed to maintain balance.                     Cognition Arousal/Alertness: Awake/alert Behavior During Therapy: WFL for tasks assessed/performed Overall Cognitive Status: Within Functional Limits for tasks assessed                      Exercises Total Joint Exercises Ankle Circles/Pumps: AROM;Both;20 reps;Supine Quad Sets: Strengthening;Left;10 reps Heel Slides: Strengthening;Left;10 reps;Seated    General Comments        Pertinent Vitals/Pain Pain Assessment: 0-10 Pain Score: 10-Worst pain ever Pain Location: LLE Pain Descriptors / Indicators: Aching;Sharp;Sore Pain Intervention(s): Monitored during session;Limited activity within patient's tolerance    Home Living                      Prior Function            PT Goals (current goals can now be found in the care plan section) Acute Rehab PT Goals Patient Stated Goal: not have any pain PT  Goal Formulation: With patient Time For Goal Achievement: 06/04/16 Potential to Achieve Goals: Fair Progress towards PT goals: Progressing toward goals    Frequency  7X/week    PT Plan Current plan remains appropriate    Co-evaluation             End of Session Equipment Utilized During Treatment: Gait belt Activity Tolerance: Patient limited by pain Patient left: in bed;with call bell/phone within reach;with bed alarm set;with SCD's reapplied      Time: [id number] PT Time Calculation (min) (ACUTE ONLY): 30 min  Charges:  $Gait Training: 8-22 mins $Therapeutic Exercise: 8-22 mins                    G Codes:  Christiane HaBenjamin J. Odai Weiss, PT, CSCS Pager [phone] Office [phone]      05/22/2016, 9:06 AM

## 2016-05-22 NOTE — Discharge Summary (Signed)
Patient ID: Lucas Weiss MRN: [id number] DOB/AGE: 1956/01/26 60 y.o.  Admit date: 05/19/2016 Discharge date: 05/22/2016  Admission Diagnoses:  Principal Problem:   Avascular necrosis of bone of left hip [facility]) Active Problems:   Status post total replacement of left hip   Discharge Diagnoses:  Same  Past Medical History  Diagnosis Date  . Coronary artery disease   . Dyslexia   . PAD (peripheral artery disease) (HCC)     07/2012:  right ABI 0.64, left ABI 0.57.  Marland Kitchen Hypertension   . High cholesterol   . Impaired fasting glucose     Last A1c 6.1  . Bleeding hemorrhoids   . H/O hiatal hernia   . Severe recurrent major depression without psychotic features (HCC)   . Tobacco abuse     Quit in 2009  . Renal artery stenosis (HCC)     Bilateral   . CHF (congestive heart failure) (HCC)   . DVT (deep venous thrombosis) (HCC)   . Myocardial infarction (HCC)   . Alcohol abuse, in remission   . Avascular necrosis of bone of left hip (HCC) 10/15    Total hip replacement recommended by ortho, vascular says it's OK  . Neuropathy (HCC)   . Primary osteoarthritis of left hip   . Chronic low back pain   . Diverticulosis of sigmoid colon   . Diabetes mellitus without complication (HCC) 01/09/16    A1c 6.8  . Complication of anesthesia     "woke up twice during procedures @ " (09/15/2012)  . Shortness of breath dyspnea     with exertion - due to pain  . Pneumonia     history of   . Stroke [facility]) 2009    "after CABG; ~ blind left eye since" (09/15/2012)- memory loss   . Head injury, acute, with loss of consciousness [facility])     as a child    Surgeries: Procedure(s): LEFT TOTAL HIP ARTHROPLASTY ANTERIOR APPROACH on 05/19/2016   Consultants:    Discharged Condition: Improved  Hospital Course: Lucas Weiss is an 60 y.o. male who was admitted 05/19/2016 for operative treatment ofAvascular necrosis of bone of left hip (HCC). Patient has severe unremitting pain that affects sleep,  daily activities, and work/hobbies. After pre-op clearance the patient was taken to the operating room on 05/19/2016 and underwent  Procedure(s): LEFT TOTAL HIP ARTHROPLASTY ANTERIOR APPROACH.    Patient was given perioperative antibiotics: Anti-infectives    Start     Dose/Rate Route Frequency Ordered Stop   05/19/16 1800  ceFAZolin (ANCEF) IVPB 1 g/50 mL premix     1 g 100 mL/hr over 30 Minutes Intravenous Every 6 hours 05/19/16 1551 05/20/16 0049   05/19/16 1014  ceFAZolin (ANCEF) 2-4 GM/100ML-% IVPB    Comments:  Forte, Lindsi   : cabinet override      05/19/16 1014 05/19/16 2229   05/19/16 1007  ceFAZolin (ANCEF) IVPB 2g/100 mL premix     2 g 200 mL/hr over 30 Minutes Intravenous On call to O.R. 05/19/16 1007 05/19/16 1215       Patient was given sequential compression devices, early ambulation, and chemoprophylaxis to prevent DVT.  Patient benefited maximally from hospital stay and there were no complications.    Recent vital signs: Patient Vitals for the past 24 hrs:  BP Temp Temp src Pulse Resp SpO2  05/22/16 0638 - 98.2 F (36.8 C) Oral - - -  05/22/16 0423 (!) 160/62 mmHg (!) 100.8 F (38.2 C) Oral Marland Kitchen)  111 18 100 %  05/22/16 0054 - 98.8 F (37.1 C) Oral - - -  05/21/16 2114 (!) 146/61 mmHg 99.5 F (37.5 C) Oral (!) 102 16 99 %  05/21/16 1238 (!) 111/52 mmHg (!) 101.8 F (38.8 C) Oral 94 16 95 %  05/21/16 1200 - (!) 101.4 F (38.6 C) Oral - - -     Recent laboratory studies:  Recent Labs  05/20/16 0549 05/21/16 0500  WBC 5.6 8.3  HGB 8.8* 8.8*  HCT 27.0* 27.1*  PLT 147* 150  NA 134* 131*  K 4.2 4.7  CL 105 104  CO2 23 20*  BUN 19 20  CREATININE 1.50* 1.30*  GLUCOSE 159* 143*  CALCIUM 7.9* 8.6*     Discharge Medications:     Medication List    STOP taking these medications        oxyCODONE-acetaminophen 10-325 MG tablet  Commonly known as:  PERCOCET      TAKE these medications        aspirin 325 MG EC tablet  Take 1 tablet (325 mg total) by  mouth 2 (two) times daily after a meal.     citalopram 40 MG tablet  Commonly known as:  CELEXA  Take 1.5 tablets (60 mg total) by mouth daily.     clopidogrel 75 MG tablet  Commonly known as:  PLAVIX  Take 1 tablet (75 mg total) by mouth daily.     docusate sodium 100 MG capsule  Commonly known as:  COLACE  Take 1 capsule (100 mg total) by mouth 2 (two) times daily.     HYDROmorphone 4 MG tablet  Commonly known as:  DILAUDID  Take 1 tablet (4 mg total) by mouth every 4 (four) hours as needed for severe pain.     lisinopril 5 MG tablet  Commonly known as:  PRINIVIL,ZESTRIL  Take 1 tablet (5 mg total) by mouth daily.     lovastatin 20 MG tablet  Commonly known as:  MEVACOR  Take 2 tablets (40 mg total) by mouth at bedtime.     methocarbamol 500 MG tablet  Commonly known as:  ROBAXIN  Take 1 tablet (500 mg total) by mouth every 6 (six) hours as needed for muscle spasms.     metoprolol tartrate 25 MG tablet  Commonly known as:  LOPRESSOR  Take 1 tablet by mouth every morning and 1/2 tablets at night     nitroGLYCERIN 0.4 MG SL tablet  Commonly known as:  NITROSTAT  Place 1 tablet (0.4 mg total) under the tongue every 5 (five) minutes as needed for chest pain.     OVER THE COUNTER MEDICATION  Take 1 tablet by mouth daily as needed. OTC Laxative.     QUEtiapine 25 MG tablet  Commonly known as:  SEROQUEL  Take 1 tablet (25 mg total) by mouth at bedtime.        Diagnostic Studies: Dg Hip Port Unilat With Pelvis 1v Left  05/19/2016  CLINICAL DATA:  Postop left hip replacement. EXAM: DG HIP (WITH OR WITHOUT PELVIS) 1V PORT LEFT COMPARISON:  11/11/2015 and earlier today. FINDINGS: Exam demonstrates evidence of patient's recent left hip arthroplasty intact and unchanged. Skin staples present over the lateral left hip soft tissues. There are mild degenerative changes of the right hip. There are a couple surgical clips over the soft tissues of the right hip/inguinal region.  IMPRESSION: Post left hip arthroplasty. Electronically Signed   By: Elberta Fortis M.D.   On:  05/19/2016 15:31   Dg Hip Operative Unilat W Or W/o Pelvis Left  05/19/2016  CLINICAL DATA:  Status post left total hip replacement EXAM: OPERATIVE LEFT HIP   1 VIEW TECHNIQUE: Fluoroscopic spot image(s) were submitted for interpretation post-operatively. FLUOROSCOPY TIME:  0 minutes 28 seconds; 3 acquired images COMPARISON:  November 11, 2015. FINDINGS: Frontal views show a total hip replacement on the left with prosthetic components appearing well-seated on frontal view. No fracture or dislocation evident. IMPRESSION: Prosthetic components appear well seated on frontal view. No fracture or dislocation evident. Electronically Signed   By: Bretta BangWilliam  Woodruff III M.D.   On: 05/19/2016 14:44    Disposition: 01-Home or Self Care      Discharge Instructions    Call MD / Call 911    Complete by:  As directed   If you experience chest pain or shortness of breath, CALL 911 and be transported to the hospital emergency room.  If you develope a fever above 101 F, pus (white drainage) or increased drainage or redness at the wound, or calf pain, call your surgeon's office.     Constipation Prevention    Complete by:  As directed   Drink plenty of fluids.  Prune juice may be helpful.  You may use a stool softener, such as Colace (over the counter) 100 mg twice a day.  Use MiraLax (over the counter) for constipation as needed.     Diet - low sodium heart healthy    Complete by:  As directed      Discharge patient    Complete by:  As directed      Increase activity slowly as tolerated    Complete by:  As directed            Follow-up Information    Follow up with Kathryne HitchBLACKMAN,Nainoa Woldt Y, MD In 2 weeks.   Specialty:  Orthopedic Surgery   Contact information:   [address] [place] [address] [phone]        Signed: Kathryne HitchBLACKMAN,Marget Outten Y 05/22/2016, 6:50 AM

## 2016-05-22 NOTE — Progress Notes (Signed)
Pt awaiting ride to be discharg home. IV removed. Equipment delivered. Pt. Is alert and oriented. Pt is hemodynamically stable. AVS reviewed with pt. Capable of re verbalizing medication regimen. Discharge plan appropriate and in place.

## 2016-05-24 DIAGNOSIS — I110 Hypertensive heart disease with heart failure: Secondary | ICD-10-CM | POA: Diagnosis not present

## 2016-05-24 DIAGNOSIS — E1140 Type 2 diabetes mellitus with diabetic neuropathy, unspecified: Secondary | ICD-10-CM | POA: Diagnosis not present

## 2016-05-24 DIAGNOSIS — Z471 Aftercare following joint replacement surgery: Secondary | ICD-10-CM | POA: Diagnosis not present

## 2016-05-24 DIAGNOSIS — I509 Heart failure, unspecified: Secondary | ICD-10-CM | POA: Diagnosis not present

## 2016-05-24 DIAGNOSIS — E1151 Type 2 diabetes mellitus with diabetic peripheral angiopathy without gangrene: Secondary | ICD-10-CM | POA: Diagnosis not present

## 2016-05-24 DIAGNOSIS — I2510 Atherosclerotic heart disease of native coronary artery without angina pectoris: Secondary | ICD-10-CM | POA: Diagnosis not present

## 2016-05-25 ENCOUNTER — Telehealth: Payer: Self-pay

## 2016-05-25 NOTE — Telephone Encounter (Signed)
Pharmacy called, to let us know that patient brought in a prescription for a different pain medication from a different doctor. Notified patient that we are aware that he just had a procedure done.

## 2016-05-26 DIAGNOSIS — I2510 Atherosclerotic heart disease of native coronary artery without angina pectoris: Secondary | ICD-10-CM | POA: Diagnosis not present

## 2016-05-26 DIAGNOSIS — E1140 Type 2 diabetes mellitus with diabetic neuropathy, unspecified: Secondary | ICD-10-CM | POA: Diagnosis not present

## 2016-05-26 DIAGNOSIS — I110 Hypertensive heart disease with heart failure: Secondary | ICD-10-CM | POA: Diagnosis not present

## 2016-05-26 DIAGNOSIS — E1151 Type 2 diabetes mellitus with diabetic peripheral angiopathy without gangrene: Secondary | ICD-10-CM | POA: Diagnosis not present

## 2016-05-26 DIAGNOSIS — Z471 Aftercare following joint replacement surgery: Secondary | ICD-10-CM | POA: Diagnosis not present

## 2016-05-26 DIAGNOSIS — I509 Heart failure, unspecified: Secondary | ICD-10-CM | POA: Diagnosis not present

## 2016-05-27 DIAGNOSIS — I110 Hypertensive heart disease with heart failure: Secondary | ICD-10-CM | POA: Diagnosis not present

## 2016-05-27 DIAGNOSIS — I509 Heart failure, unspecified: Secondary | ICD-10-CM | POA: Diagnosis not present

## 2016-05-27 DIAGNOSIS — E1140 Type 2 diabetes mellitus with diabetic neuropathy, unspecified: Secondary | ICD-10-CM | POA: Diagnosis not present

## 2016-05-27 DIAGNOSIS — E1151 Type 2 diabetes mellitus with diabetic peripheral angiopathy without gangrene: Secondary | ICD-10-CM | POA: Diagnosis not present

## 2016-05-27 DIAGNOSIS — Z471 Aftercare following joint replacement surgery: Secondary | ICD-10-CM | POA: Diagnosis not present

## 2016-05-27 DIAGNOSIS — I2510 Atherosclerotic heart disease of native coronary artery without angina pectoris: Secondary | ICD-10-CM | POA: Diagnosis not present

## 2016-05-28 ENCOUNTER — Ambulatory Visit (HOSPITAL_COMMUNITY): Payer: Medicare Other

## 2016-05-28 ENCOUNTER — Ambulatory Visit: Payer: Medicare Other | Admitting: Vascular Surgery

## 2016-05-28 DIAGNOSIS — E1151 Type 2 diabetes mellitus with diabetic peripheral angiopathy without gangrene: Secondary | ICD-10-CM | POA: Diagnosis not present

## 2016-05-28 DIAGNOSIS — Z471 Aftercare following joint replacement surgery: Secondary | ICD-10-CM | POA: Diagnosis not present

## 2016-05-28 DIAGNOSIS — I110 Hypertensive heart disease with heart failure: Secondary | ICD-10-CM | POA: Diagnosis not present

## 2016-05-28 DIAGNOSIS — I509 Heart failure, unspecified: Secondary | ICD-10-CM | POA: Diagnosis not present

## 2016-05-28 DIAGNOSIS — E1140 Type 2 diabetes mellitus with diabetic neuropathy, unspecified: Secondary | ICD-10-CM | POA: Diagnosis not present

## 2016-05-28 DIAGNOSIS — I2510 Atherosclerotic heart disease of native coronary artery without angina pectoris: Secondary | ICD-10-CM | POA: Diagnosis not present

## 2016-06-01 DIAGNOSIS — M1612 Unilateral primary osteoarthritis, left hip: Secondary | ICD-10-CM | POA: Diagnosis not present

## 2016-06-01 DIAGNOSIS — M25552 Pain in left hip: Secondary | ICD-10-CM | POA: Diagnosis not present

## 2016-06-02 DIAGNOSIS — I509 Heart failure, unspecified: Secondary | ICD-10-CM | POA: Diagnosis not present

## 2016-06-02 DIAGNOSIS — I110 Hypertensive heart disease with heart failure: Secondary | ICD-10-CM | POA: Diagnosis not present

## 2016-06-02 DIAGNOSIS — I2510 Atherosclerotic heart disease of native coronary artery without angina pectoris: Secondary | ICD-10-CM | POA: Diagnosis not present

## 2016-06-02 DIAGNOSIS — E1151 Type 2 diabetes mellitus with diabetic peripheral angiopathy without gangrene: Secondary | ICD-10-CM | POA: Diagnosis not present

## 2016-06-02 DIAGNOSIS — Z471 Aftercare following joint replacement surgery: Secondary | ICD-10-CM | POA: Diagnosis not present

## 2016-06-02 DIAGNOSIS — E1140 Type 2 diabetes mellitus with diabetic neuropathy, unspecified: Secondary | ICD-10-CM | POA: Diagnosis not present

## 2016-06-03 DIAGNOSIS — I509 Heart failure, unspecified: Secondary | ICD-10-CM | POA: Diagnosis not present

## 2016-06-03 DIAGNOSIS — E1151 Type 2 diabetes mellitus with diabetic peripheral angiopathy without gangrene: Secondary | ICD-10-CM | POA: Diagnosis not present

## 2016-06-03 DIAGNOSIS — E1140 Type 2 diabetes mellitus with diabetic neuropathy, unspecified: Secondary | ICD-10-CM | POA: Diagnosis not present

## 2016-06-03 DIAGNOSIS — I110 Hypertensive heart disease with heart failure: Secondary | ICD-10-CM | POA: Diagnosis not present

## 2016-06-03 DIAGNOSIS — I2510 Atherosclerotic heart disease of native coronary artery without angina pectoris: Secondary | ICD-10-CM | POA: Diagnosis not present

## 2016-06-03 DIAGNOSIS — Z471 Aftercare following joint replacement surgery: Secondary | ICD-10-CM | POA: Diagnosis not present

## 2016-06-04 DIAGNOSIS — I509 Heart failure, unspecified: Secondary | ICD-10-CM | POA: Diagnosis not present

## 2016-06-04 DIAGNOSIS — I2510 Atherosclerotic heart disease of native coronary artery without angina pectoris: Secondary | ICD-10-CM | POA: Diagnosis not present

## 2016-06-04 DIAGNOSIS — E1151 Type 2 diabetes mellitus with diabetic peripheral angiopathy without gangrene: Secondary | ICD-10-CM | POA: Diagnosis not present

## 2016-06-04 DIAGNOSIS — I110 Hypertensive heart disease with heart failure: Secondary | ICD-10-CM | POA: Diagnosis not present

## 2016-06-04 DIAGNOSIS — Z471 Aftercare following joint replacement surgery: Secondary | ICD-10-CM | POA: Diagnosis not present

## 2016-06-04 DIAGNOSIS — E1140 Type 2 diabetes mellitus with diabetic neuropathy, unspecified: Secondary | ICD-10-CM | POA: Diagnosis not present

## 2016-06-08 ENCOUNTER — Encounter: Payer: Self-pay | Admitting: Family Medicine

## 2016-06-08 ENCOUNTER — Ambulatory Visit (INDEPENDENT_AMBULATORY_CARE_PROVIDER_SITE_OTHER): Payer: Medicare Other | Admitting: Family Medicine

## 2016-06-08 VITALS — BP 125/73 | HR 74 | Temp 98.4°F | Wt 180.0 lb

## 2016-06-08 DIAGNOSIS — G8929 Other chronic pain: Secondary | ICD-10-CM

## 2016-06-08 DIAGNOSIS — F332 Major depressive disorder, recurrent severe without psychotic features: Secondary | ICD-10-CM

## 2016-06-08 MED ORDER — METHOCARBAMOL 500 MG PO TABS: 500.0000 mg | ORAL_TABLET | Freq: Four times a day (QID) | ORAL | Status: DC | PRN

## 2016-06-08 MED ORDER — CITALOPRAM HYDROBROMIDE 20 MG PO TABS: 30.0000 mg | ORAL_TABLET | Freq: Every day | ORAL | Status: DC

## 2016-06-08 NOTE — Progress Notes (Signed)
BP 125/73 mmHg  Pulse 74  Temp(Src) 98.4 F (36.9 C)  Wt 180 lb (81.647 kg)  SpO2 99%   Subjective:    Patient ID: Lucas Weiss, male    DOB: 08-22-1956, 60 y.o.   MRN: [id number]  HPI: Lucas Weiss is a 60 y.o. male  Chief Complaint  Patient presents with  . Pain    Patient states that he needs a refill on his Percocet    Hip is hurting a whole lot. His back is hurting him a whole lot. Just finished PT.  Mood has not changed- has not been taking his celexa. Forgot he was supposed to. Does not know how long he has not been taking it.   Has been using ice for his back.  Seeing Dr. Magnus IvanBlackman again on July 14th He is out of his pain medicine and his muscle relaxer. He saw his orthopedist on the 3rd and was given 60 2mg  hydromorphones. Had been given 20 4mg  hydromorphones after surgery.  Appointment notes from orthopedics not available at this time.  He feels like he is going to need his percocet for another month until he is recovered more.   Relevant past medical, surgical, family and social history reviewed and updated as indicated. Interim medical history since our last visit reviewed. Allergies and medications reviewed and updated.  Review of Systems  Constitutional: Negative.   Respiratory: Negative.   Cardiovascular: Negative.   Musculoskeletal: Positive for myalgias, back pain, joint swelling, arthralgias and gait problem. Negative for neck pain and neck stiffness.  Psychiatric/Behavioral: Positive for dysphoric mood. Negative for suicidal ideas, hallucinations, behavioral problems, confusion, sleep disturbance, self-injury, decreased concentration and agitation. The patient is nervous/anxious. The patient is not hyperactive.     Per HPI unless specifically indicated above     Objective:    BP 125/73 mmHg  Pulse 74  Temp(Src) 98.4 F (36.9 C)  Wt 180 lb (81.647 kg)  SpO2 99%  Wt Readings from Last 3 Encounters:  06/08/16 180 lb (81.647 kg)  05/19/16 190 lb  (86.183 kg)  05/12/16 190 lb 1.6 oz (86.229 kg)    Physical Exam  Constitutional: He is oriented to person, place, and time. He appears well-developed and well-nourished. No distress.  HENT:  Head: Normocephalic and atraumatic.  Right Ear: Hearing normal.  Left Ear: Hearing normal.  Nose: Nose normal.  Eyes: Conjunctivae and lids are normal. Right eye exhibits no discharge. Left eye exhibits no discharge. No scleral icterus.  Cardiovascular: Normal rate, regular rhythm, normal heart sounds and intact distal pulses.  Exam reveals no gallop and no friction rub.   No murmur heard. Pulmonary/Chest: Effort normal and breath sounds normal. No respiratory distress. He has no wheezes. He has no rales. He exhibits no tenderness.  Musculoskeletal:  Antalgic gait. Wasting L thigh  Neurological: He is alert and oriented to person, place, and time.  Skin: Skin is warm, dry and intact. No rash noted. No erythema. No pallor.  Psychiatric: His speech is normal and behavior is normal. Judgment and thought content normal. His mood appears anxious. Cognition and memory are normal. He exhibits a depressed mood.  Nursing note and vitals reviewed.   Results for orders placed or performed during the hospital encounter of 05/19/16  Glucose, capillary  Result Value Ref Range   Glucose-Capillary 142 (H) 65 - 99 mg/dL  Glucose, capillary  Result Value Ref Range   Glucose-Capillary 146 (H) 65 - 99 mg/dL   Comment 1 Notify RN  Comment 2 Document in Chart   Glucose, capillary  Result Value Ref Range   Glucose-Capillary 119 (H) 65 - 99 mg/dL  CBC  Result Value Ref Range   WBC 5.6 4.0 - 10.5 K/uL   RBC 3.17 (L) 4.22 - 5.81 MIL/uL   Hemoglobin 8.8 (L) 13.0 - 17.0 g/dL   HCT 16.1 (L) 09.6 - 04.5 %   MCV 85.2 78.0 - 100.0 fL   MCH 27.8 26.0 - 34.0 pg   MCHC 32.6 30.0 - 36.0 g/dL   RDW 40.9 81.1 - 91.4 %   Platelets 147 (L) 150 - 400 K/uL  Basic metabolic panel  Result Value Ref Range   Sodium 134 (L)  135 - 145 mmol/L   Potassium 4.2 3.5 - 5.1 mmol/L   Chloride 105 101 - 111 mmol/L   CO2 23 22 - 32 mmol/L   Glucose, Bld 159 (H) 65 - 99 mg/dL   BUN 19 6 - 20 mg/dL   Creatinine, Ser 7.82 (H) 0.61 - 1.24 mg/dL   Calcium 7.9 (L) 8.9 - 10.3 mg/dL   GFR calc non Af Amer 50 (L) >60 mL/min   GFR calc Af Amer 58 (L) >60 mL/min   Anion gap 6 5 - 15  CBC  Result Value Ref Range   WBC 8.3 4.0 - 10.5 K/uL   RBC 3.19 (L) 4.22 - 5.81 MIL/uL   Hemoglobin 8.8 (L) 13.0 - 17.0 g/dL   HCT 95.6 (L) 21.3 - 08.6 %   MCV 85.0 78.0 - 100.0 fL   MCH 27.6 26.0 - 34.0 pg   MCHC 32.5 30.0 - 36.0 g/dL   RDW 57.8 46.9 - 62.9 %   Platelets 150 150 - 400 K/uL  Basic metabolic panel  Result Value Ref Range   Sodium 131 (L) 135 - 145 mmol/L   Potassium 4.7 3.5 - 5.1 mmol/L   Chloride 104 101 - 111 mmol/L   CO2 20 (L) 22 - 32 mmol/L   Glucose, Bld 143 (H) 65 - 99 mg/dL   BUN 20 6 - 20 mg/dL   Creatinine, Ser 5.28 (H) 0.61 - 1.24 mg/dL   Calcium 8.6 (L) 8.9 - 10.3 mg/dL   GFR calc non Af Amer 59 (L) >60 mL/min   GFR calc Af Amer >60 >60 mL/min   Anion gap 7 5 - 15      Assessment & Plan:   Problem List Items Addressed This Visit      Other   Severe recurrent major depression (HCC) - Primary    Off his citalopram. Not sure why. Will restart it. Rx given today. Will start working him back up. Continue to monitor closely.      Relevant Medications   citalopram (CELEXA) 20 MG tablet   Chronic pain    19 days post-op from a THR. Notes from orthopedist not available at this time except discharge summary. Patient states he needs refills of his percocet. Has been getting dilaudid for pain from orthopedist. Call out to them as to whether they are expecting him to also have percocet or not during post-op. Await their call back and will treat as needed. To see them again in about a month. Will refill his robaxin at this time. Will obtain notes from orthopedist and treat accordingly. Await discussion with them.        Relevant Medications   oxyCODONE-acetaminophen (PERCOCET) 10-325 MG tablet   citalopram (CELEXA) 20 MG tablet   methocarbamol (ROBAXIN) 500 MG tablet  Follow up plan: Return in about 4 weeks (around 07/06/2016).

## 2016-06-08 NOTE — Assessment & Plan Note (Signed)
Off his citalopram. Not sure why. Will restart it. Rx given today. Will start working him back up. Continue to monitor closely.

## 2016-06-08 NOTE — Assessment & Plan Note (Signed)
19 days post-op from a THR. Notes from orthopedist not available at this time except discharge summary. Patient states he needs refills of his percocet. Has been getting dilaudid for pain from orthopedist. Call out to them as to whether they are expecting him to also have percocet or not during post-op. Await their call back and will treat as needed. To see them again in about a month. Will refill his robaxin at this time. Will obtain notes from orthopedist and treat accordingly. Await discussion with them.

## 2016-06-09 ENCOUNTER — Telehealth: Payer: Self-pay

## 2016-06-09 NOTE — Telephone Encounter (Signed)
Nurse returned my call, patient is giving pain medications for 12 weeks post operative, will stop around August 11, 2016. He is receiving a prescription for Dilaudid from them.

## 2016-06-09 NOTE — Telephone Encounter (Signed)
Patient notified

## 2016-06-09 NOTE — Telephone Encounter (Signed)
Please let patient know that his surgeon will be prescribing his pain medicine until August. So he doesn't need to come pick anything up

## 2016-07-14 ENCOUNTER — Encounter: Payer: Self-pay | Admitting: Family Medicine

## 2016-07-14 ENCOUNTER — Ambulatory Visit (INDEPENDENT_AMBULATORY_CARE_PROVIDER_SITE_OTHER): Payer: Medicare Other | Admitting: Family Medicine

## 2016-07-14 VITALS — BP 135/88 | HR 72 | Temp 98.6°F | Wt 183.0 lb

## 2016-07-14 DIAGNOSIS — E119 Type 2 diabetes mellitus without complications: Secondary | ICD-10-CM | POA: Diagnosis not present

## 2016-07-14 DIAGNOSIS — E785 Hyperlipidemia, unspecified: Secondary | ICD-10-CM | POA: Diagnosis not present

## 2016-07-14 DIAGNOSIS — F332 Major depressive disorder, recurrent severe without psychotic features: Secondary | ICD-10-CM | POA: Diagnosis not present

## 2016-07-14 DIAGNOSIS — I701 Atherosclerosis of renal artery: Secondary | ICD-10-CM

## 2016-07-14 DIAGNOSIS — I110 Hypertensive heart disease with heart failure: Secondary | ICD-10-CM | POA: Diagnosis not present

## 2016-07-14 LAB — BAYER DCA HB A1C WAIVED: HB A1C: 6 % (ref <7.0)

## 2016-07-14 MED ORDER — METOPROLOL TARTRATE 25 MG PO TABS: ORAL_TABLET | ORAL | 1 refills | Status: DC

## 2016-07-14 MED ORDER — ASPIRIN 325 MG PO TBEC: 325.0000 mg | DELAYED_RELEASE_TABLET | Freq: Two times a day (BID) | ORAL | 4 refills | Status: DC

## 2016-07-14 MED ORDER — LISINOPRIL 5 MG PO TABS: 5.0000 mg | ORAL_TABLET | Freq: Every day | ORAL | 1 refills | Status: DC

## 2016-07-14 MED ORDER — CLOPIDOGREL BISULFATE 75 MG PO TABS: 75.0000 mg | ORAL_TABLET | Freq: Every day | ORAL | 1 refills | Status: DC

## 2016-07-14 MED ORDER — NITROGLYCERIN 0.4 MG SL SUBL: 0.4000 mg | SUBLINGUAL_TABLET | SUBLINGUAL | 1 refills | Status: DC | PRN

## 2016-07-14 MED ORDER — LOVASTATIN 20 MG PO TABS: 40.0000 mg | ORAL_TABLET | Freq: Every day | ORAL | 1 refills | Status: DC

## 2016-07-14 MED ORDER — QUETIAPINE FUMARATE 25 MG PO TABS: 25.0000 mg | ORAL_TABLET | Freq: Every day | ORAL | 1 refills | Status: DC

## 2016-07-14 NOTE — Assessment & Plan Note (Signed)
A1c down to 6.0! Continue current regimen. Continue to monitor. Refills given today.

## 2016-07-14 NOTE — Assessment & Plan Note (Signed)
Rechecking levels today. Continue current regimen. Continue to monitor. Refills given today.

## 2016-07-14 NOTE — Assessment & Plan Note (Signed)
Doing well since his pain has decreased. Off celexa. Continue to monitor closely. Recheck 2 months with PHQ9

## 2016-07-14 NOTE — Progress Notes (Signed)
BP 135/88 (BP Location: Left Arm, Patient Position: Sitting, Cuff Size: Normal)   Pulse 72   Temp 98.6 F (37 C)   Wt 183 lb (83 kg)   SpO2 99%   BMI 26.26 kg/m    Subjective:    Patient ID: Lucas Weiss, male    DOB: 1956/04/28, 60 y.o.   MRN: [id number]  HPI: Lucas Weiss is a 60 y.o. male  Chief Complaint  Patient presents with  . rx refill    Patient has changed pharmacy, so he now needs his prescriptions sent to [facility]   Had his hip replacement done at the end of May. Had not been taking his celexa for unknown amount of time. Saw ortho back last week. Has not been taking his celexa. Pain is doing so much better. Feeling much better. Seeing his orthopedist again tomorrow.   DIABETES Hypoglycemic episodes:no Polydipsia/polyuria: no Visual disturbance: no Chest pain: no Paresthesias: no Glucose Monitoring: no Taking Insulin?: no Blood Pressure Monitoring: not checking Retinal Examination: Up to Date Foot Exam: Up to Date Diabetic Education: Completed Pneumovax: Up to Date Influenza: Up to Date Aspirin: yes  HYPERTENSION / HYPERLIPIDEMIA Satisfied with current treatment? yes Duration of hypertension: chronic BP monitoring frequency: not checking BP medication side effects: no Duration of hyperlipidemia: chronic Cholesterol medication side effects: no Cholesterol supplements: none Medication compliance: good compliance Aspirin: yes Recent stressors: no Recurrent headaches: no Visual changes: no Palpitations: no Dyspnea: no Chest pain: no Lower extremity edema: no Dizzy/lightheaded: no  Relevant past medical, surgical, family and social history reviewed and updated as indicated. Interim medical history since our last visit reviewed. Allergies and medications reviewed and updated.  Review of Systems  Constitutional: Negative.   Respiratory: Negative.   Cardiovascular: Negative.   Musculoskeletal: Positive for arthralgias, back pain,  gait problem and myalgias. Negative for joint swelling, neck pain and neck stiffness.  Neurological: Negative for dizziness, tremors, seizures, syncope, facial asymmetry, speech difficulty, weakness, light-headedness, numbness and headaches.  Psychiatric/Behavioral: Negative.     Per HPI unless specifically indicated above     Objective:    BP 135/88 (BP Location: Left Arm, Patient Position: Sitting, Cuff Size: Normal)   Pulse 72   Temp 98.6 F (37 C)   Wt 183 lb (83 kg)   SpO2 99%   BMI 26.26 kg/m   Wt Readings from Last 3 Encounters:  07/14/16 183 lb (83 kg)  06/08/16 180 lb (81.6 kg)  05/19/16 190 lb (86.2 kg)    Physical Exam  Constitutional: He is oriented to person, place, and time. He appears well-developed and well-nourished. No distress.  HENT:  Head: Normocephalic and atraumatic.  Right Ear: Hearing normal.  Left Ear: Hearing normal.  Nose: Nose normal.  Eyes: Conjunctivae and lids are normal. Right eye exhibits no discharge. Left eye exhibits no discharge. No scleral icterus.  Cardiovascular: Normal rate, regular rhythm, normal heart sounds and intact distal pulses.  Exam reveals no gallop and no friction rub.   No murmur heard. Pulmonary/Chest: Effort normal and breath sounds normal. No respiratory distress. He has no wheezes. He has no rales. He exhibits no tenderness.  Musculoskeletal: He exhibits edema and tenderness. He exhibits no deformity.  Improved atrophy L thigh  Neurological: He is alert and oriented to person, place, and time.  Skin: Skin is warm, dry and intact. No rash noted. No erythema. No pallor.  Psychiatric: He has a normal mood and affect. His speech is normal and  behavior is normal. Judgment and thought content normal. Cognition and memory are normal.  Nursing note and vitals reviewed.   Results for orders placed or performed during the hospital encounter of 05/19/16  Glucose, capillary  Result Value Ref Range   Glucose-Capillary 142 (H)  65 - 99 mg/dL  Glucose, capillary  Result Value Ref Range   Glucose-Capillary 146 (H) 65 - 99 mg/dL   Comment 1 Notify RN    Comment 2 Document in Chart   Glucose, capillary  Result Value Ref Range   Glucose-Capillary 119 (H) 65 - 99 mg/dL  CBC  Result Value Ref Range   WBC 5.6 4.0 - 10.5 K/uL   RBC 3.17 (L) 4.22 - 5.81 MIL/uL   Hemoglobin 8.8 (L) 13.0 - 17.0 g/dL   HCT 16.1 (L) 09.6 - 04.5 %   MCV 85.2 78.0 - 100.0 fL   MCH 27.8 26.0 - 34.0 pg   MCHC 32.6 30.0 - 36.0 g/dL   RDW 40.9 81.1 - 91.4 %   Platelets 147 (L) 150 - 400 K/uL  Basic metabolic panel  Result Value Ref Range   Sodium 134 (L) 135 - 145 mmol/L   Potassium 4.2 3.5 - 5.1 mmol/L   Chloride 105 101 - 111 mmol/L   CO2 23 22 - 32 mmol/L   Glucose, Bld 159 (H) 65 - 99 mg/dL   BUN 19 6 - 20 mg/dL   Creatinine, Ser 7.82 (H) 0.61 - 1.24 mg/dL   Calcium 7.9 (L) 8.9 - 10.3 mg/dL   GFR calc non Af Amer 50 (L) >60 mL/min   GFR calc Af Amer 58 (L) >60 mL/min   Anion gap 6 5 - 15  CBC  Result Value Ref Range   WBC 8.3 4.0 - 10.5 K/uL   RBC 3.19 (L) 4.22 - 5.81 MIL/uL   Hemoglobin 8.8 (L) 13.0 - 17.0 g/dL   HCT 95.6 (L) 21.3 - 08.6 %   MCV 85.0 78.0 - 100.0 fL   MCH 27.6 26.0 - 34.0 pg   MCHC 32.5 30.0 - 36.0 g/dL   RDW 57.8 46.9 - 62.9 %   Platelets 150 150 - 400 K/uL  Basic metabolic panel  Result Value Ref Range   Sodium 131 (L) 135 - 145 mmol/L   Potassium 4.7 3.5 - 5.1 mmol/L   Chloride 104 101 - 111 mmol/L   CO2 20 (L) 22 - 32 mmol/L   Glucose, Bld 143 (H) 65 - 99 mg/dL   BUN 20 6 - 20 mg/dL   Creatinine, Ser 5.28 (H) 0.61 - 1.24 mg/dL   Calcium 8.6 (L) 8.9 - 10.3 mg/dL   GFR calc non Af Amer 59 (L) >60 mL/min   GFR calc Af Amer >60 >60 mL/min   Anion gap 7 5 - 15      Assessment & Plan:   Problem List Items Addressed This Visit      Cardiovascular and Mediastinum   Hypertensive heart failure (HCC)   Relevant Medications   lisinopril (PRINIVIL,ZESTRIL) 5 MG tablet   lovastatin (MEVACOR) 20 MG  tablet   aspirin 325 MG EC tablet   nitroGLYCERIN (NITROSTAT) 0.4 MG SL tablet   metoprolol tartrate (LOPRESSOR) 25 MG tablet   Renal artery stenosis, native, bilateral (HCC)    Rechecking levels today. Continue current regimen. Continue to monitor. Refills given today.      Relevant Medications   lisinopril (PRINIVIL,ZESTRIL) 5 MG tablet   lovastatin (MEVACOR) 20 MG tablet   aspirin  325 MG EC tablet   nitroGLYCERIN (NITROSTAT) 0.4 MG SL tablet   metoprolol tartrate (LOPRESSOR) 25 MG tablet   Other Relevant Orders   Comprehensive metabolic panel     Other   HLD (hyperlipidemia)    Rechecking levels today. Continue current regimen. Continue to monitor. Refills given today.      Relevant Medications   lisinopril (PRINIVIL,ZESTRIL) 5 MG tablet   lovastatin (MEVACOR) 20 MG tablet   aspirin 325 MG EC tablet   nitroGLYCERIN (NITROSTAT) 0.4 MG SL tablet   metoprolol tartrate (LOPRESSOR) 25 MG tablet   Other Relevant Orders   Comprehensive metabolic panel   Lipid Panel w/o Chol/HDL Ratio   Severe recurrent major depression (HCC)    Doing well since his pain has decreased. Off celexa. Continue to monitor closely. Recheck 2 months with PHQ9      Diet-controlled diabetes mellitus (HCC) - Primary    A1c down to 6.0! Continue current regimen. Continue to monitor. Refills given today.      Relevant Medications   lisinopril (PRINIVIL,ZESTRIL) 5 MG tablet   lovastatin (MEVACOR) 20 MG tablet   aspirin 325 MG EC tablet   Other Relevant Orders   Bayer DCA Hb A1c Waived   Comprehensive metabolic panel    Other Visit Diagnoses   None.      Follow up plan: Return in about 2 months (around 09/14/2016).

## 2016-07-14 NOTE — Assessment & Plan Note (Signed)
Rechecking levels today. Continue current regimen. Continue to monitor. Refills given today. 

## 2016-07-15 ENCOUNTER — Telehealth: Payer: Self-pay | Admitting: Family Medicine

## 2016-07-15 LAB — COMPREHENSIVE METABOLIC PANEL
A/G RATIO: 1.5 (ref 1.2–2.2)
ALK PHOS: 83 IU/L (ref 39–117)
ALT: 22 IU/L (ref 0–44)
AST: 16 IU/L (ref 0–40)
Albumin: 4.3 g/dL (ref 3.5–5.5)
BUN/Creatinine Ratio: 15 (ref 9–20)
BUN: 14 mg/dL (ref 6–24)
CALCIUM: 9.4 mg/dL (ref 8.7–10.2)
CHLORIDE: 101 mmol/L (ref 96–106)
CO2: 19 mmol/L (ref 18–29)
Creatinine, Ser: 0.95 mg/dL (ref 0.76–1.27)
GFR calc Af Amer: 102 mL/min/{1.73_m2} (ref >59)
GFR, EST NON AFRICAN AMERICAN: 88 mL/min/{1.73_m2} (ref >59)
GLOBULIN, TOTAL: 2.8 g/dL (ref 1.5–4.5)
Glucose: 217 mg/dL — ABNORMAL HIGH (ref 65–99)
POTASSIUM: 4.4 mmol/L (ref 3.5–5.2)
SODIUM: 138 mmol/L (ref 134–144)
Total Protein: 7.1 g/dL (ref 6.0–8.5)

## 2016-07-15 LAB — LIPID PANEL W/O CHOL/HDL RATIO
Cholesterol, Total: 232 mg/dL — ABNORMAL HIGH (ref 100–199)
HDL: 40 mg/dL (ref >39)
LDL Calculated: 152 mg/dL — ABNORMAL HIGH (ref 0–99)
TRIGLYCERIDES: 200 mg/dL — AB (ref 0–149)
VLDL Cholesterol Cal: 40 mg/dL (ref 5–40)

## 2016-07-15 NOTE — Telephone Encounter (Signed)
Would you please let him know that his labs look good! I'll see him in 2 months. Thanks!

## 2016-07-15 NOTE — Telephone Encounter (Signed)
Patient notified

## 2016-07-16 DIAGNOSIS — M5442 Lumbago with sciatica, left side: Secondary | ICD-10-CM | POA: Diagnosis not present

## 2016-07-18 ENCOUNTER — Other Ambulatory Visit: Payer: Self-pay | Admitting: Physician Assistant

## 2016-07-18 DIAGNOSIS — M545 Low back pain: Secondary | ICD-10-CM

## 2016-07-20 ENCOUNTER — Encounter: Payer: Self-pay | Admitting: Radiology

## 2016-07-28 ENCOUNTER — Other Ambulatory Visit: Payer: Medicare Other

## 2016-08-06 ENCOUNTER — Ambulatory Visit: Admission: RE | Admit: 2016-08-06 | Payer: Medicare Other | Source: Ambulatory Visit

## 2016-08-14 ENCOUNTER — Ambulatory Visit: Admission: RE | Admit: 2016-08-14 | Payer: Medicare Other | Source: Ambulatory Visit

## 2016-08-22 ENCOUNTER — Ambulatory Visit: Payer: Medicare Other

## 2016-08-27 ENCOUNTER — Ambulatory Visit
Admission: RE | Admit: 2016-08-27 | Discharge: 2016-08-27 | Disposition: A | Discharge status: Home / Self Care | Payer: Medicare Other | Source: Ambulatory Visit | Attending: Physician Assistant | Admitting: Physician Assistant

## 2016-08-27 DIAGNOSIS — M5126 Other intervertebral disc displacement, lumbar region: Secondary | ICD-10-CM | POA: Diagnosis not present

## 2016-08-27 DIAGNOSIS — M545 Low back pain: Secondary | ICD-10-CM | POA: Insufficient documentation

## 2016-08-27 DIAGNOSIS — M47816 Spondylosis without myelopathy or radiculopathy, lumbar region: Secondary | ICD-10-CM | POA: Insufficient documentation

## 2016-09-14 ENCOUNTER — Ambulatory Visit (INDEPENDENT_AMBULATORY_CARE_PROVIDER_SITE_OTHER): Payer: Medicare Other | Admitting: Family Medicine

## 2016-09-14 ENCOUNTER — Encounter: Payer: Self-pay | Admitting: Family Medicine

## 2016-09-14 VITALS — BP 126/76 | HR 86 | Temp 98.9°F | Wt 187.0 lb

## 2016-09-14 DIAGNOSIS — F332 Major depressive disorder, recurrent severe without psychotic features: Secondary | ICD-10-CM | POA: Diagnosis not present

## 2016-09-14 DIAGNOSIS — I701 Atherosclerosis of renal artery: Secondary | ICD-10-CM

## 2016-09-14 DIAGNOSIS — Z23 Encounter for immunization: Secondary | ICD-10-CM | POA: Diagnosis not present

## 2016-09-14 DIAGNOSIS — G8929 Other chronic pain: Secondary | ICD-10-CM

## 2016-09-14 MED ORDER — DULOXETINE HCL 20 MG PO CPEP: 20.0000 mg | ORAL_CAPSULE | Freq: Every day | ORAL | 2 refills | Status: DC

## 2016-09-14 MED ORDER — CYCLOBENZAPRINE HCL 10 MG PO TABS: 10.0000 mg | ORAL_TABLET | Freq: Every day | ORAL | 1 refills | Status: DC

## 2016-09-14 MED ORDER — QUETIAPINE FUMARATE 50 MG PO TABS: 50.0000 mg | ORAL_TABLET | Freq: Every day | ORAL | 1 refills | Status: DC

## 2016-09-14 NOTE — Assessment & Plan Note (Signed)
Does not appear to be due to boney issue. Seems to be due to sciatica. Exercises given. Will start cyclobenzaprine. Recheck 1 month.

## 2016-09-14 NOTE — Progress Notes (Signed)
BP 126/76   Pulse 86   Temp 98.9 F (37.2 C)   Wt 187 lb (84.8 kg)   SpO2 98%   BMI 26.83 kg/m    Subjective:    Patient ID: Lucas Weiss, male    DOB: [birth year], 58 y.o.   MRN: [id number]  HPI: Lucas Weiss is a 60 y.o. male  Chief Complaint  Patient presents with  . Depression   DEPRESSION Mood status: uncontrolled Satisfied with current treatment?: no Symptom severity: moderate  Duration of current treatment : chronic Side effects: no Medication compliance: excellent compliance Psychotherapy/counseling: no  Depressed mood: yes Anxious mood: yes Anhedonia: yes Significant weight loss or gain: no Insomnia: yes hard to fall asleep Fatigue: yes Feelings of worthlessness or guilt: yes Impaired concentration/indecisiveness: yes Suicidal ideations: no Hopelessness: yes Crying spells: no [facility] screen [facility] 09/14/2016 04/07/2016 02/06/2016 01/09/2016 08/15/2015  Decreased Interest 3 3 2 3 2   Down, Depressed, Hopeless 3 2 3 3 2   PHQ - 2 Score 6 5 5 6 4   Altered sleeping 3 3 3 3 3   Tired, decreased energy 3 3 3 3 3   Change in appetite 0 1 3 3 1   Feeling bad or failure about yourself  2 3 2 3 2   Trouble concentrating 3 3 3 3 3   Moving slowly or fidgety/restless 2 2 2 3 2   Suicidal thoughts 0 0 0 0 0  PHQ-9 Score 19 20 21 24 18   Difficult doing work/chores - - Very difficult Very difficult Very difficult   Passing blood out of his bottom for about a week about 2 weeks ago- gone now, totally resolved  Pain is back in his L lower back going into his bottom. Had MRI done which was normal. Not sleeping well because of it  Relevant past medical, surgical, family and social history reviewed and updated as indicated. Interim medical history since our last visit reviewed. Allergies and medications reviewed and updated.  Review of Systems  Constitutional: Negative.   Respiratory: Negative.   Cardiovascular: Negative.   Musculoskeletal: Positive for arthralgias,  back pain and myalgias. Negative for gait problem, joint swelling, neck pain and neck stiffness.  Psychiatric/Behavioral: Positive for dysphoric mood and sleep disturbance. Negative for agitation, behavioral problems, confusion, decreased concentration, hallucinations, self-injury and suicidal ideas. The patient is nervous/anxious. The patient is not hyperactive.     Per HPI unless specifically indicated above     Objective:    BP 126/76   Pulse 86   Temp 98.9 F (37.2 C)   Wt 187 lb (84.8 kg)   SpO2 98%   BMI 26.83 kg/m   Wt Readings from Last 3 Encounters:  09/14/16 187 lb (84.8 kg)  07/14/16 183 lb (83 kg)  06/08/16 180 lb (81.6 kg)    Physical Exam  Constitutional: He is oriented to person, place, and time. He appears well-developed and well-nourished. No distress.  HENT:  Head: Normocephalic and atraumatic.  Right Ear: Hearing normal.  Left Ear: Hearing normal.  Nose: Nose normal.  Eyes: Conjunctivae and lids are normal. Right eye exhibits no discharge. Left eye exhibits no discharge. No scleral icterus.  Cardiovascular: Normal rate, regular rhythm, normal heart sounds and intact distal pulses.  Exam reveals no gallop and no friction rub.   No murmur heard. Pulmonary/Chest: Effort normal and breath sounds normal. No respiratory distress. He has no wheezes. He has no rales. He exhibits no tenderness.  Musculoskeletal:  Spasm of the low back and  spasm in glut on the L  Neurological: He is alert and oriented to person, place, and time.  Skin: Skin is warm, dry and intact. No rash noted. He is not diaphoretic. No erythema. No pallor.  Psychiatric: He has a normal mood and affect. His speech is normal and behavior is normal. Judgment and thought content normal. Cognition and memory are normal.  Nursing note and vitals reviewed.   Results for orders placed or performed in visit on 07/14/16  Bayer DCA Hb A1c Waived  Result Value Ref Range   Bayer DCA Hb A1c Waived 6.0 <7.0 %   Comprehensive metabolic panel  Result Value Ref Range   Glucose 217 (H) 65 - 99 mg/dL   BUN 14 6 - 24 mg/dL   Creatinine, Ser 7.82 0.76 - 1.27 mg/dL   GFR calc non Af Amer 88 >59 mL/min/1.73   GFR calc Af Amer 102 >59 mL/min/1.73   BUN/Creatinine Ratio 15 9 - 20   Sodium 138 134 - 144 mmol/L   Potassium 4.4 3.5 - 5.2 mmol/L   Chloride 101 96 - 106 mmol/L   CO2 19 18 - 29 mmol/L   Calcium 9.4 8.7 - 10.2 mg/dL   Total Protein 7.1 6.0 - 8.5 g/dL   Albumin 4.3 3.5 - 5.5 g/dL   Globulin, Total 2.8 1.5 - 4.5 g/dL   Albumin/Globulin Ratio 1.5 1.2 - 2.2   Bilirubin Total <0.2 0.0 - 1.2 mg/dL   Alkaline Phosphatase 83 39 - 117 IU/L   AST 16 0 - 40 IU/L   ALT 22 0 - 44 IU/L  Lipid Panel w/o Chol/HDL Ratio  Result Value Ref Range   Cholesterol, Total 232 (H) 100 - 199 mg/dL   Triglycerides 956 (H) 0 - 149 mg/dL   HDL 40 >21 mg/dL   VLDL Cholesterol Cal 40 5 - 40 mg/dL   LDL Calculated 308 (H) 0 - 99 mg/dL      Assessment & Plan:   Problem List Items Addressed This Visit      Other   Severe recurrent major depression (HCC)    Off medicine. Will start cymbalta and recheck in 1 month. Seroquel to help with sleep. Call with concerns.       Relevant Medications   DULoxetine (CYMBALTA) 20 MG capsule   Chronic pain    Does not appear to be due to boney issue. Seems to be due to sciatica. Exercises given. Will start cyclobenzaprine. Recheck 1 month.       Relevant Medications   DULoxetine (CYMBALTA) 20 MG capsule   cyclobenzaprine (FLEXERIL) 10 MG tablet    Other Visit Diagnoses    Immunization due    -  Primary   Flu shot given today.   Relevant Orders   Flu Vaccine QUAD 36+ mos PF IM (Fluarix & Fluzone Quad PF) (Completed)       Follow up plan: Return in about 4 weeks (around 10/12/2016) for DM and mood follow up.

## 2016-09-14 NOTE — Assessment & Plan Note (Signed)
Off medicine. Will start cymbalta and recheck in 1 month. Seroquel to help with sleep. Call with concerns.

## 2016-09-14 NOTE — Patient Instructions (Addendum)
Sciatica With Rehab The sciatic nerve runs from the back down the leg and is responsible for sensation and control of the muscles in the back (posterior) side of the thigh, lower leg, and foot. Sciatica is a condition that is characterized by inflammation of this nerve.  SYMPTOMS   Signs of nerve damage, including numbness and/or weakness along the posterior side of the lower extremity.  Pain in the back of the thigh that may also travel down the leg.  Pain that worsens when sitting for long periods of time.  Occasionally, pain in the back or buttock. CAUSES  Inflammation of the sciatic nerve is the cause of sciatica. The inflammation is due to something irritating the nerve. Common sources of irritation include:  Sitting for long periods of time.  Direct trauma to the nerve.  Arthritis of the spine.  Herniated or ruptured disk.  Slipping of the vertebrae (spondylolisthesis).  Pressure from soft tissues, such as muscles or ligament-like tissue (fascia). RISK INCREASES WITH:  Sports that place pressure or stress on the spine (football or weightlifting).  Poor strength and flexibility.  Failure to warm up properly before activity.  Family history of low back pain or disk disorders.  Previous back injury or surgery.  Poor body mechanics, especially when lifting, or poor posture. PREVENTION   Warm up and stretch properly before activity.  Maintain physical fitness:  Strength, flexibility, and endurance.  Cardiovascular fitness.  Learn and use proper technique, especially with posture and lifting. When possible, have coach correct improper technique.  Avoid activities that place stress on the spine. PROGNOSIS If treated properly, then sciatica usually resolves within 6 weeks. However, occasionally surgery is necessary.  RELATED COMPLICATIONS   Permanent nerve damage, including pain, numbness, tingle, or weakness.  Chronic back pain.  Risks of surgery: infection,  bleeding, nerve damage, or damage to surrounding tissues. TREATMENT Treatment initially involves resting from any activities that aggravate your symptoms. The use of ice and medication may help reduce pain and inflammation. The use of strengthening and stretching exercises may help reduce pain with activity. These exercises may be performed at home or with referral to a therapist. A therapist may recommend further treatments, such as transcutaneous electronic nerve stimulation (TENS) or ultrasound. Your caregiver may recommend corticosteroid injections to help reduce inflammation of the sciatic nerve. If symptoms persist despite non-surgical (conservative) treatment, then surgery may be recommended. MEDICATION  If pain medication is necessary, then nonsteroidal anti-inflammatory medications, such as aspirin and ibuprofen, or other minor pain relievers, such as acetaminophen, are often recommended.  Do not take pain medication for 7 days before surgery.  Prescription pain relievers may be given if deemed necessary by your caregiver. Use only as directed and only as much as you need.  Ointments applied to the skin may be helpful.  Corticosteroid injections may be given by your caregiver. These injections should be reserved for the most serious cases, because they may only be given a certain number of times. HEAT AND COLD  Cold treatment (icing) relieves pain and reduces inflammation. Cold treatment should be applied for 10 to 15 minutes every 2 to 3 hours for inflammation and pain and immediately after any activity that aggravates your symptoms. Use ice packs or massage the area with a piece of ice (ice massage).  Heat treatment may be used prior to performing the stretching and strengthening activities prescribed by your caregiver, physical therapist, or athletic trainer. Use a heat pack or soak the injury in warm water.   SEEK MEDICAL CARE IF:  Treatment seems to offer no benefit, or the condition  worsens.  Any medications produce adverse side effects. EXERCISES  RANGE OF MOTION (ROM) AND STRETCHING EXERCISES - Sciatica Most people with sciatic will find that their symptoms worsen with either excessive bending forward (flexion) or arching at the low back (extension). The exercises which will help resolve your symptoms will focus on the opposite motion. Your physician, physical therapist or athletic trainer will help you determine which exercises will be most helpful to resolve your low back pain. Do not complete any exercises without first consulting with your clinician. Discontinue any exercises which worsen your symptoms until you speak to your clinician. If you have pain, numbness or tingling which travels down into your buttocks, leg or foot, the goal of the therapy is for these symptoms to move closer to your back and eventually resolve. Occasionally, these leg symptoms will get better, but your low back pain may worsen; this is typically an indication of progress in your rehabilitation. Be certain to be very alert to any changes in your symptoms and the activities in which you participated in the 24 hours prior to the change. Sharing this information with your clinician will allow him/her to most efficiently treat your condition. These exercises may help you when beginning to rehabilitate your injury. Your symptoms may resolve with or without further involvement from your physician, physical therapist or athletic trainer. While completing these exercises, remember:   Restoring tissue flexibility helps normal motion to return to the joints. This allows healthier, less painful movement and activity.  An effective stretch should be held for at least 30 seconds.  A stretch should never be painful. You should only feel a gentle lengthening or release in the stretched tissue. FLEXION RANGE OF MOTION AND STRETCHING EXERCISES: STRETCH - Flexion, Single Knee to Chest   Lie on a firm bed or floor  with both legs extended in front of you.  Keeping one leg in contact with the floor, bring your opposite knee to your chest. Hold your leg in place by either grabbing behind your thigh or at your knee.  Pull until you feel a gentle stretch in your low back. Hold __________ seconds.  Slowly release your grasp and repeat the exercise with the opposite side. Repeat __________ times. Complete this exercise __________ times per day.  STRETCH - Flexion, Double Knee to Chest  Lie on a firm bed or floor with both legs extended in front of you.  Keeping one leg in contact with the floor, bring your opposite knee to your chest.  Tense your stomach muscles to support your back and then lift your other knee to your chest. Hold your legs in place by either grabbing behind your thighs or at your knees.  Pull both knees toward your chest until you feel a gentle stretch in your low back. Hold __________ seconds.  Tense your stomach muscles and slowly return one leg at a time to the floor. Repeat __________ times. Complete this exercise __________ times per day.  STRETCH - Low Trunk Rotation   Lie on a firm bed or floor. Keeping your legs in front of you, bend your knees so they are both pointed toward the ceiling and your feet are flat on the floor.  Extend your arms out to the side. This will stabilize your upper body by keeping your shoulders in contact with the floor.  Gently and slowly drop both knees together to one side until   you feel a gentle stretch in your low back. Hold for __________ seconds.  Tense your stomach muscles to support your low back as you bring your knees back to the starting position. Repeat the exercise to the other side. Repeat __________ times. Complete this exercise __________ times per day  EXTENSION RANGE OF MOTION AND FLEXIBILITY EXERCISES: STRETCH - Extension, Prone on Elbows  Lie on your stomach on the floor, a bed will be too soft. Place your palms about shoulder  width apart and at the height of your head.  Place your elbows under your shoulders. If this is too painful, stack pillows under your chest.  Allow your body to relax so that your hips drop lower and make contact more completely with the floor.  Hold this position for __________ seconds.  Slowly return to lying flat on the floor. Repeat __________ times. Complete this exercise __________ times per day.  RANGE OF MOTION - Extension, Prone Press Ups  Lie on your stomach on the floor, a bed will be too soft. Place your palms about shoulder width apart and at the height of your head.  Keeping your back as relaxed as possible, slowly straighten your elbows while keeping your hips on the floor. You may adjust the placement of your hands to maximize your comfort. As you gain motion, your hands will come more underneath your shoulders.  Hold this position __________ seconds.  Slowly return to lying flat on the floor. Repeat __________ times. Complete this exercise __________ times per day.  STRENGTHENING EXERCISES - Sciatica  These exercises may help you when beginning to rehabilitate your injury. These exercises should be done near your "sweet spot." This is the neutral, low-back arch, somewhere between fully rounded and fully arched, that is your least painful position. When performed in this safe range of motion, these exercises can be used for people who have either a flexion or extension based injury. These exercises may resolve your symptoms with or without further involvement from your physician, physical therapist or athletic trainer. While completing these exercises, remember:   Muscles can gain both the endurance and the strength needed for everyday activities through controlled exercises.  Complete these exercises as instructed by your physician, physical therapist or athletic trainer. Progress with the resistance and repetition exercises only as your caregiver advises.  You may  experience muscle soreness or fatigue, but the pain or discomfort you are trying to eliminate should never worsen during these exercises. If this pain does worsen, stop and make certain you are following the directions exactly. If the pain is still present after adjustments, discontinue the exercise until you can discuss the trouble with your clinician. STRENGTHENING - Deep Abdominals, Pelvic Tilt   Lie on a firm bed or floor. Keeping your legs in front of you, bend your knees so they are both pointed toward the ceiling and your feet are flat on the floor.  Tense your lower abdominal muscles to press your low back into the floor. This motion will rotate your pelvis so that your tail bone is scooping upwards rather than pointing at your feet or into the floor.  With a gentle tension and even breathing, hold this position for __________ seconds. Repeat __________ times. Complete this exercise __________ times per day.  STRENGTHENING - Abdominals, Crunches   Lie on a firm bed or floor. Keeping your legs in front of you, bend your knees so they are both pointed toward the ceiling and your feet are flat on the   floor. Cross your arms over your chest.  Slightly tip your chin down without bending your neck.  Tense your abdominals and slowly lift your trunk high enough to just clear your shoulder blades. Lifting higher can put excessive stress on the low back and does not further strengthen your abdominal muscles.  Control your return to the starting position. Repeat __________ times. Complete this exercise __________ times per day.  STRENGTHENING - Quadruped, Opposite UE/LE Lift  Assume a hands and knees position on a firm surface. Keep your hands under your shoulders and your knees under your hips. You may place padding under your knees for comfort.  Find your neutral spine and gently tense your abdominal muscles so that you can maintain this position. Your shoulders and hips should form a rectangle  that is parallel with the floor and is not twisted.  Keeping your trunk steady, lift your right hand no higher than your shoulder and then your left leg no higher than your hip. Make sure you are not holding your breath. Hold this position __________ seconds.  Continuing to keep your abdominal muscles tense and your back steady, slowly return to your starting position. Repeat with the opposite arm and leg. Repeat __________ times. Complete this exercise __________ times per day.  STRENGTHENING - Abdominals and Quadriceps, Straight Leg Raise   Lie on a firm bed or floor with both legs extended in front of you.  Keeping one leg in contact with the floor, bend the other knee so that your foot can rest flat on the floor.  Find your neutral spine, and tense your abdominal muscles to maintain your spinal position throughout the exercise.  Slowly lift your straight leg off the floor about 6 inches for a count of 15, making sure to not hold your breath.  Still keeping your neutral spine, slowly lower your leg all the way to the floor. Repeat this exercise with each leg __________ times. Complete this exercise __________ times per day. POSTURE AND BODY MECHANICS CONSIDERATIONS - Sciatica Keeping correct posture when sitting, standing or completing your activities will reduce the stress put on different body tissues, allowing injured tissues a chance to heal and limiting painful experiences. The following are general guidelines for improved posture. Your physician or physical therapist will provide you with any instructions specific to your needs. While reading these guidelines, remember:  The exercises prescribed by your provider will help you have the flexibility and strength to maintain correct postures.  The correct posture provides the optimal environment for your joints to work. All of your joints have less wear and tear when properly supported by a spine with good posture. This means you will  experience a healthier, less painful body.  Correct posture must be practiced with all of your activities, especially prolonged sitting and standing. Correct posture is as important when doing repetitive low-stress activities (typing) as it is when doing a single heavy-load activity (lifting). RESTING POSITIONS Consider which positions are most painful for you when choosing a resting position. If you have pain with flexion-based activities (sitting, bending, stooping, squatting), choose a position that allows you to rest in a less flexed posture. You would want to avoid curling into a fetal position on your side. If your pain worsens with extension-based activities (prolonged standing, working overhead), avoid resting in an extended position such as sleeping on your stomach. Most people will find more comfort when they rest with their spine in a more neutral position, neither too rounded nor too   arched. Lying on a non-sagging bed on your side with a pillow between your knees, or on your back with a pillow under your knees will often provide some relief. Keep in mind, being in any one position for a prolonged period of time, no matter how correct your posture, can still lead to stiffness. PROPER SITTING POSTURE In order to minimize stress and discomfort on your spine, you must sit with correct posture Sitting with good posture should be effortless for a healthy body. Returning to good posture is a gradual process. Many people can work toward this most comfortably by using various supports until they have the flexibility and strength to maintain this posture on their own. When sitting with proper posture, your ears will fall over your shoulders and your shoulders will fall over your hips. You should use the back of the chair to support your upper back. Your low back will be in a neutral position, just slightly arched. You may place a small pillow or folded towel at the base of your low back for support.  When  working at a desk, create an environment that supports good, upright posture. Without extra support, muscles fatigue and lead to excessive strain on joints and other tissues. Keep these recommendations in mind: CHAIR:   A chair should be able to slide under your desk when your back makes contact with the back of the chair. This allows you to work closely.  The chair's height should allow your eyes to be level with the upper part of your monitor and your hands to be slightly lower than your elbows. BODY POSITION  Your feet should make contact with the floor. If this is not possible, use a foot rest.  Keep your ears over your shoulders. This will reduce stress on your neck and low back. INCORRECT SITTING POSTURES   If you are feeling tired and unable to assume a healthy sitting posture, do not slouch or slump. This puts excessive strain on your back tissues, causing more damage and pain. Healthier options include:  Using more support, like a lumbar pillow.  Switching tasks to something that requires you to be upright or walking.  Talking a brief walk.  Lying down to rest in a neutral-spine position. PROLONGED STANDING WHILE SLIGHTLY LEANING FORWARD  When completing a task that requires you to lean forward while standing in one place for a long time, place either foot up on a stationary 2-4 inch high object to help maintain the best posture. When both feet are on the ground, the low back tends to lose its slight inward curve. If this curve flattens (or becomes too large), then the back and your other joints will experience too much stress, fatigue more quickly and can cause pain.  CORRECT STANDING POSTURES Proper standing posture should be assumed with all daily activities, even if they only take a few moments, like when brushing your teeth. As in sitting, your ears should fall over your shoulders and your shoulders should fall over your hips. You should keep a slight tension in your abdominal  muscles to brace your spine. Your tailbone should point down to the ground, not behind your body, resulting in an over-extended swayback posture.  INCORRECT STANDING POSTURES  Common incorrect standing postures include a forward head, locked knees and/or an excessive swayback. WALKING Walk with an upright posture. Your ears, shoulders and hips should all line-up. PROLONGED ACTIVITY IN A FLEXED POSITION When completing a task that requires you to bend forward   at your waist or lean over a low surface, try to find a way to stabilize 3 of 4 of your limbs. You can place a hand or elbow on your thigh or rest a knee on the surface you are reaching across. This will provide you more stability so that your muscles do not fatigue as quickly. By keeping your knees relaxed, or slightly bent, you will also reduce stress across your low back. CORRECT LIFTING TECHNIQUES DO :   Assume a wide stance. This will provide you more stability and the opportunity to get as close as possible to the object which you are lifting.  Tense your abdominals to brace your spine; then bend at the knees and hips. Keeping your back locked in a neutral-spine position, lift using your leg muscles. Lift with your legs, keeping your back straight.  Test the weight of unknown objects before attempting to lift them.  Try to keep your elbows locked down at your sides in order get the best strength from your shoulders when carrying an object.  Always ask for help when lifting heavy or awkward objects. INCORRECT LIFTING TECHNIQUES DO NOT:   Lock your knees when lifting, even if it is a small object.  Bend and twist. Pivot at your feet or move your feet when needing to change directions.  Assume that you cannot safely pick up a paperclip without proper posture.   This information is not intended to replace advice given to you by your health care provider. Make sure you discuss any questions you have with your health care provider.     Document Released: 12/07/2005 Document Revised: 04/23/2015 Document Reviewed: 03/21/2009 [employer] Interactive Patient Education 2016 [employer]  

## 2016-10-12 ENCOUNTER — Ambulatory Visit (INDEPENDENT_AMBULATORY_CARE_PROVIDER_SITE_OTHER): Payer: Medicare Other | Admitting: Family Medicine

## 2016-10-12 ENCOUNTER — Encounter: Payer: Self-pay | Admitting: Family Medicine

## 2016-10-12 VITALS — BP 136/84 | HR 87 | Temp 98.9°F | Wt 191.0 lb

## 2016-10-12 DIAGNOSIS — M545 Low back pain, unspecified: Secondary | ICD-10-CM

## 2016-10-12 DIAGNOSIS — I701 Atherosclerosis of renal artery: Secondary | ICD-10-CM

## 2016-10-12 DIAGNOSIS — E1165 Type 2 diabetes mellitus with hyperglycemia: Secondary | ICD-10-CM

## 2016-10-12 DIAGNOSIS — G8929 Other chronic pain: Secondary | ICD-10-CM

## 2016-10-12 DIAGNOSIS — F332 Major depressive disorder, recurrent severe without psychotic features: Secondary | ICD-10-CM | POA: Diagnosis not present

## 2016-10-12 DIAGNOSIS — E119 Type 2 diabetes mellitus without complications: Secondary | ICD-10-CM

## 2016-10-12 LAB — BAYER DCA HB A1C WAIVED: HB A1C: 9.5 % — AB (ref <7.0)

## 2016-10-12 MED ORDER — DULOXETINE HCL 30 MG PO CPEP: 30.0000 mg | ORAL_CAPSULE | Freq: Every day | ORAL | 1 refills | Status: DC

## 2016-10-12 MED ORDER — METFORMIN HCL ER 500 MG PO TB24: 500.0000 mg | ORAL_TABLET | Freq: Every day | ORAL | 1 refills | Status: DC

## 2016-10-12 NOTE — Assessment & Plan Note (Signed)
Neuropathy better. Depression not significantly better. Still with a lot of social issues. Will increase cymbalta to 30mg  and recheck in 1 month. Call with any concerns.
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
## 2016-10-12 NOTE — Assessment & Plan Note (Signed)
Not under good control. a1c 9.5- will restart his metformin and recheck in 3 months. Check on tolerance in 1 month.

## 2016-10-16 IMAGING — MR MR LUMBAR SPINE W/O CM
4 of 5 series · 15 of 48 positions shown · non-contrast
Comparison: Plain films lumbar spine 12/04/2015.

CLINICAL DATA: Low back pain. Bilateral buttock pain and burning.
Left leg pain. No recent injury.

EXAM:
MRI LUMBAR SPINE WITHOUT CONTRAST
TECHNIQUE: Multiplanar, multisequence MR imaging of the lumbar spine was
performed. No intravenous contrast was administered.

[Series 3: T2 · sagittal · 4.0mm · 0.44mm/px · 6 of 15 slices shown (1 of 2)]
[im 1/15]
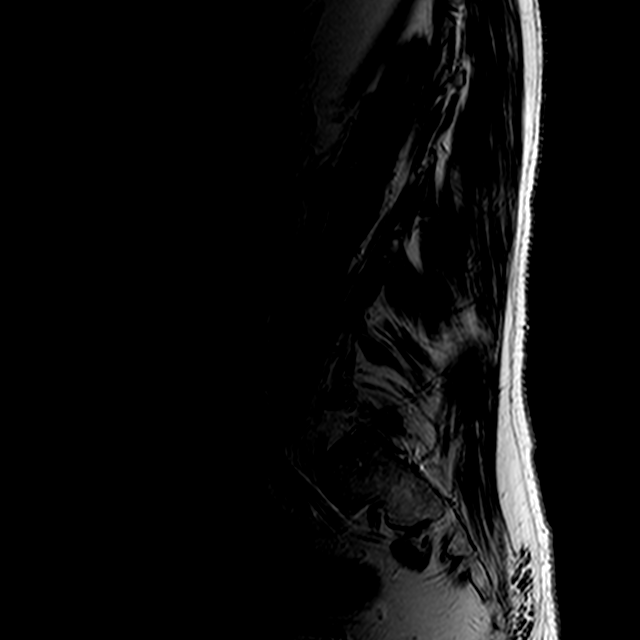
[im 3/15]
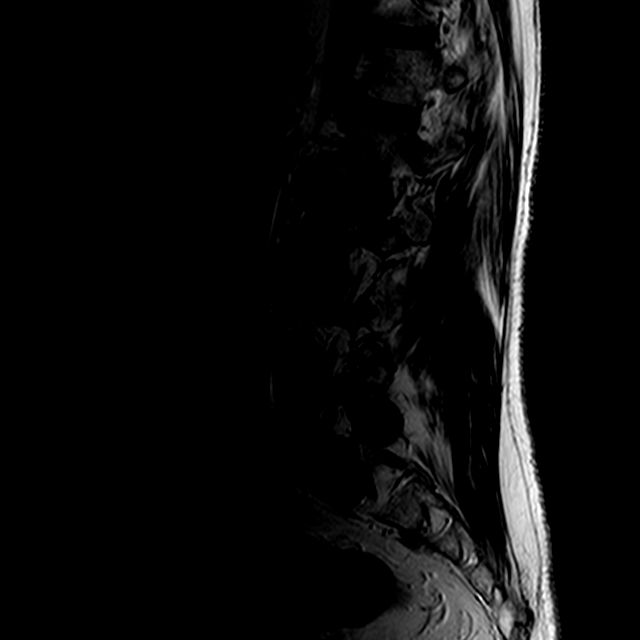
[im 6/15]
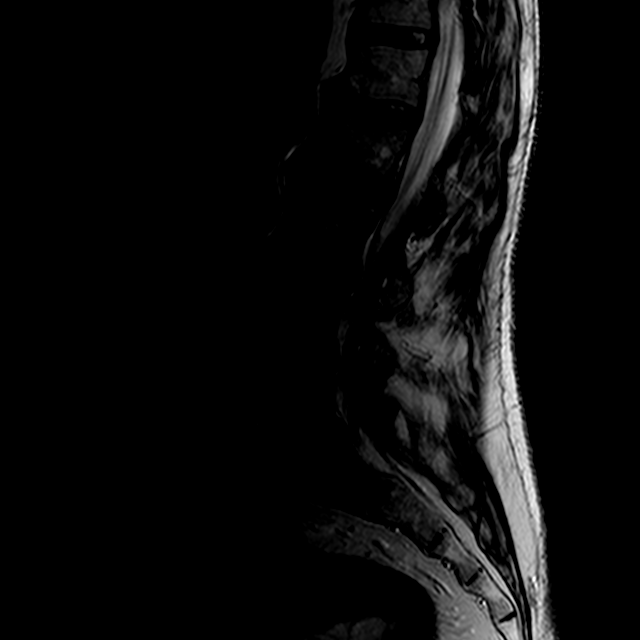
[im 9/15]
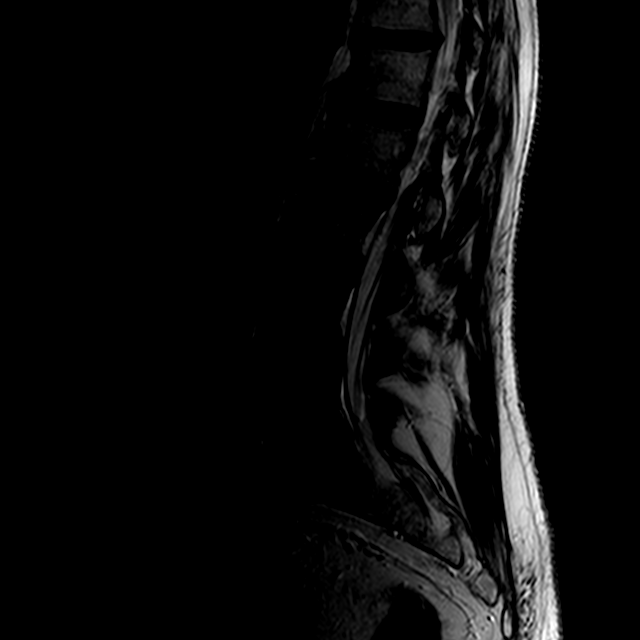
[im 12/15]
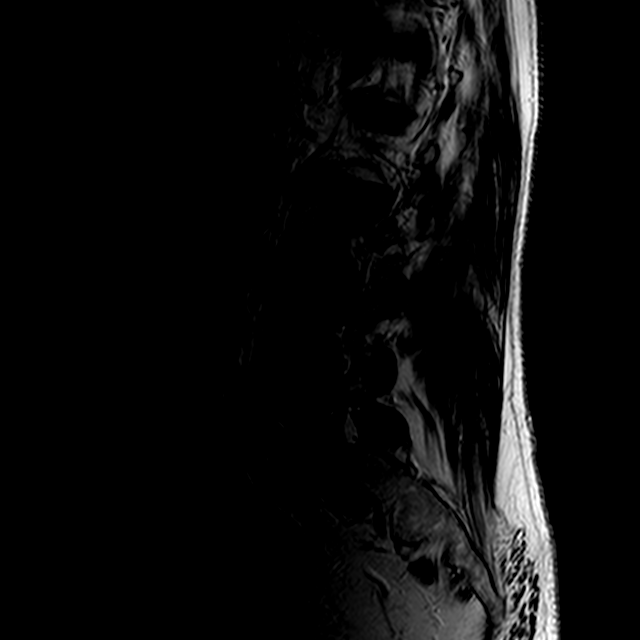
[im 15/15]
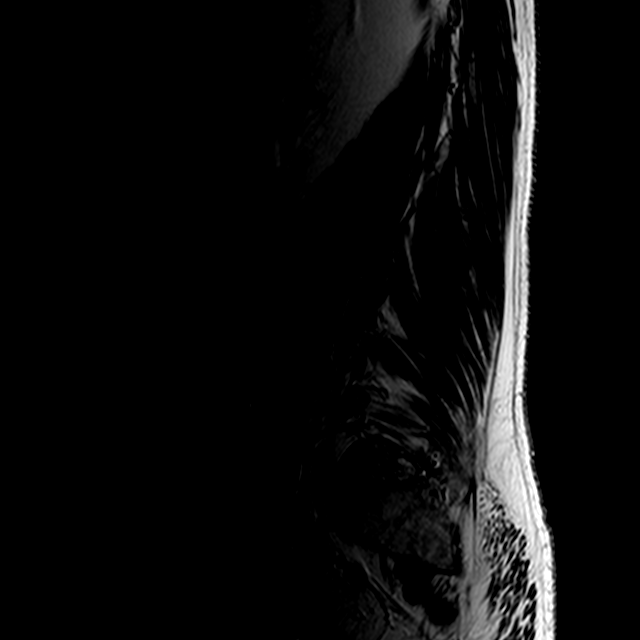

[Series 4: T1 · sagittal · 4.0mm · 0.44mm/px · 3 of 15 slices shown (1 of 2)]
[im 3/15]
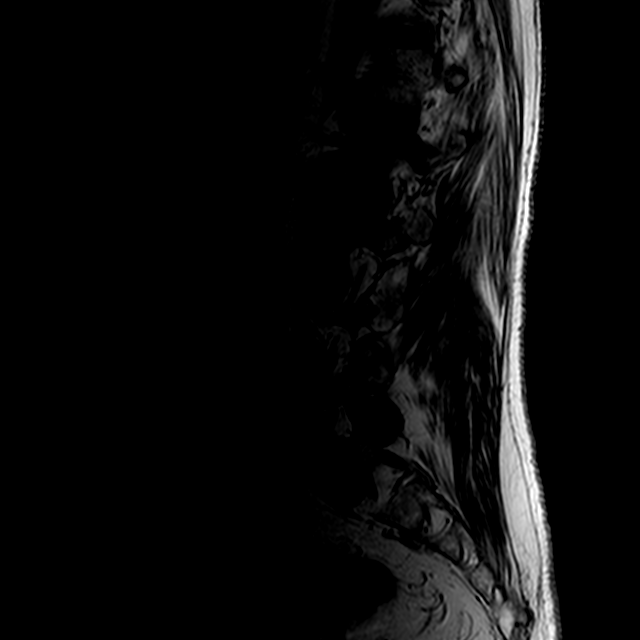
[im 8/15]
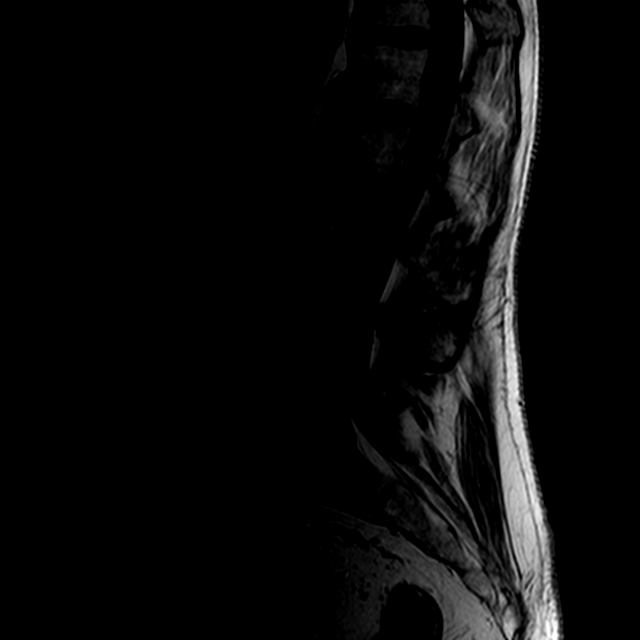
[im 12/15]
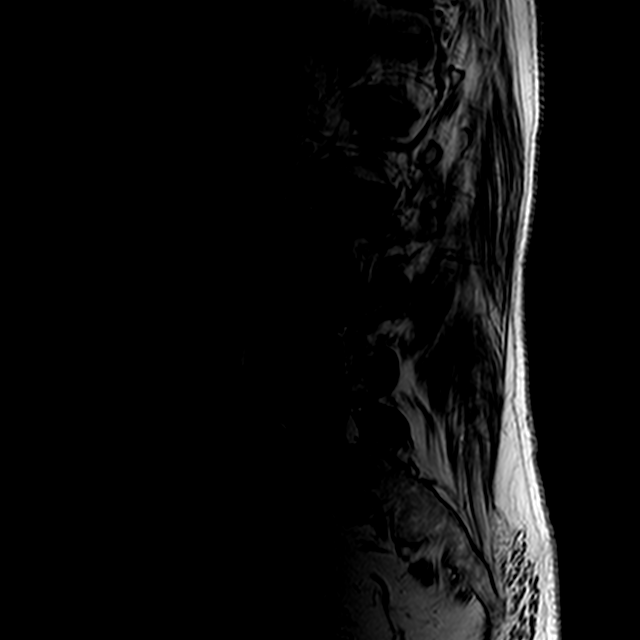

[Series 6: T2 · axial · 4.0mm · 0.39mm/px · z∈[-20,+126]mm · 3 of 32 slices shown (2 of 2)]
[im 5/32]
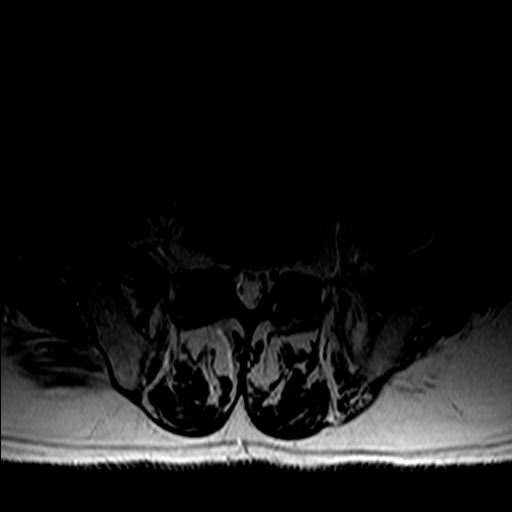
[im 17/32]
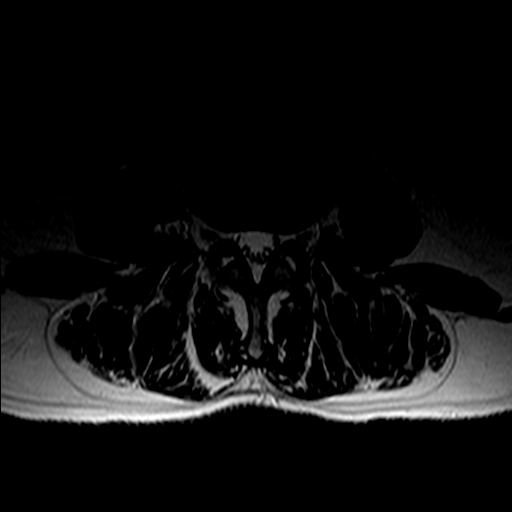
[im 27/32]
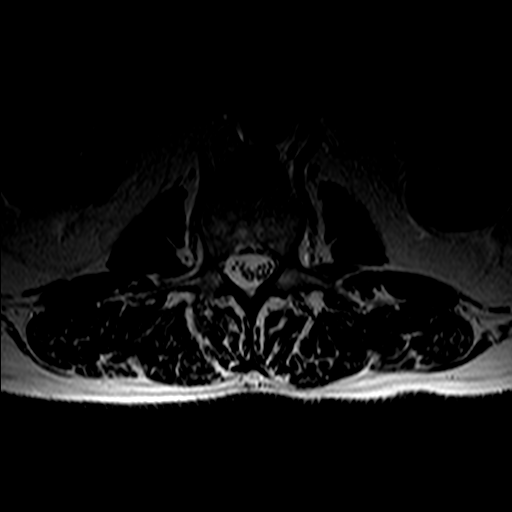

[Series 7: T1 · axial · 4.0mm · 0.39mm/px · z∈[-20,+126]mm · 3 of 32 slices shown (2 of 2)]
[im 5/32]
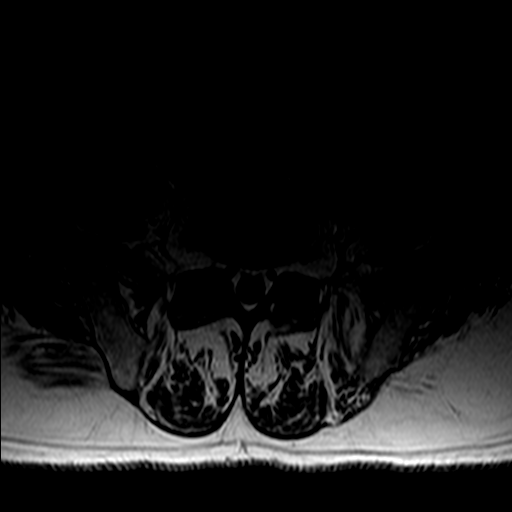
[im 17/32]
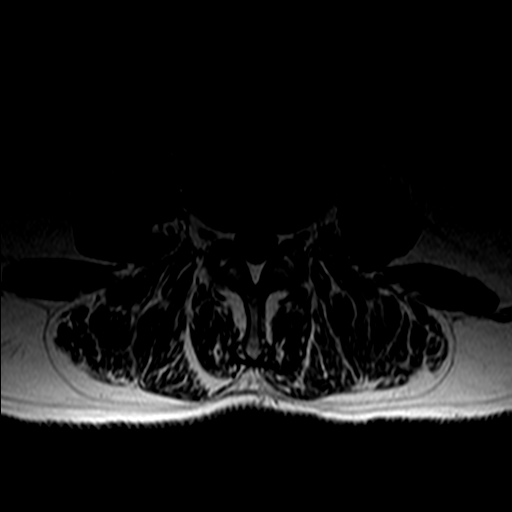
[im 27/32]
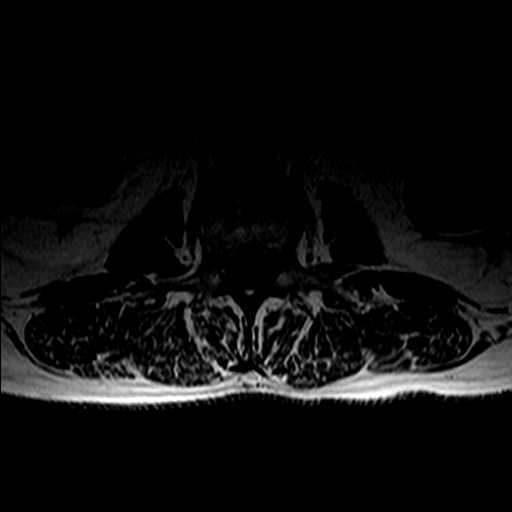

[15 of 48 positions shown; findings below may reference images not displayed]

FINDINGS: Segmentation:  Unremarkable.

Alignment:  Trace retrolisthesis L2 on L3 is noted.

Vertebrae: Height is maintained. There is no fracture or worrisome
marrow lesion.

Conus medullaris: Extends to the T12-L1 level and appears normal.

Paraspinal and other soft tissues: Unremarkable.

Disc levels:

T12-L1 is imaged in the sagittal plane only. There is a central and
left paracentral protrusion at this level but the central canal and
foramina appear open.

L1-2:  Negative.

L2-3:  Negative.

L3-4:  Negative.

L4-5: Mild disc bulge and facet degenerative change. The central
canal and foramina are widely patent.

L5-S1:  Mild facet arthropathy.  Otherwise negative.
IMPRESSION: Mild spondylosis of the lumbar spine without central canal or
foraminal narrowing.

## 2016-11-04 ENCOUNTER — Encounter: Payer: Self-pay | Admitting: Family Medicine

## 2016-11-05 DIAGNOSIS — G8929 Other chronic pain: Secondary | ICD-10-CM | POA: Diagnosis not present

## 2016-11-05 DIAGNOSIS — M545 Low back pain: Secondary | ICD-10-CM | POA: Diagnosis not present

## 2016-11-16 ENCOUNTER — Other Ambulatory Visit: Payer: Self-pay | Admitting: Family Medicine

## 2016-11-16 ENCOUNTER — Encounter: Payer: Self-pay | Admitting: Family Medicine

## 2016-11-16 ENCOUNTER — Ambulatory Visit (INDEPENDENT_AMBULATORY_CARE_PROVIDER_SITE_OTHER): Payer: Medicare Other | Admitting: Family Medicine

## 2016-11-16 VITALS — BP 152/86 | HR 79 | Temp 98.6°F | Wt 189.9 lb

## 2016-11-16 DIAGNOSIS — F332 Major depressive disorder, recurrent severe without psychotic features: Secondary | ICD-10-CM | POA: Diagnosis not present

## 2016-11-16 DIAGNOSIS — E1165 Type 2 diabetes mellitus with hyperglycemia: Secondary | ICD-10-CM

## 2016-11-16 DIAGNOSIS — J40 Bronchitis, not specified as acute or chronic: Secondary | ICD-10-CM | POA: Diagnosis not present

## 2016-11-16 DIAGNOSIS — I701 Atherosclerosis of renal artery: Secondary | ICD-10-CM | POA: Diagnosis not present

## 2016-11-16 DIAGNOSIS — G894 Chronic pain syndrome: Secondary | ICD-10-CM | POA: Diagnosis not present

## 2016-11-16 MED ORDER — ALBUTEROL SULFATE (2.5 MG/3ML) 0.083% IN NEBU: 2.5000 mg | INHALATION_SOLUTION | Freq: Once | RESPIRATORY_TRACT | Status: DC

## 2016-11-16 MED ORDER — AZITHROMYCIN 250 MG PO TABS: ORAL_TABLET | ORAL | 0 refills | Status: DC

## 2016-11-16 MED ORDER — NAPROXEN 500 MG PO TABS: 500.0000 mg | ORAL_TABLET | Freq: Two times a day (BID) | ORAL | 3 refills | Status: DC

## 2016-11-16 MED ORDER — PREDNISONE 10 MG PO TABS: ORAL_TABLET | ORAL | 0 refills | Status: DC

## 2016-11-16 MED ORDER — BENZONATATE 200 MG PO CAPS: 200.0000 mg | ORAL_CAPSULE | Freq: Two times a day (BID) | ORAL | 0 refills | Status: DC | PRN

## 2016-11-16 NOTE — Assessment & Plan Note (Signed)
Took himself off all his medication. Does not want to go back on it. Does not want to see a counselor. Does not want to see a psychiatrist. Will call if he wants to go back on medication.

## 2016-11-16 NOTE — Progress Notes (Signed)
BP (!) 152/86 (BP Location: Left Arm, Patient Position: Sitting, Cuff Size: Normal)   Pulse 79   Temp 98.6 F (37 C)   Wt 189 lb 14.4 oz (86.1 kg)   SpO2 100%   BMI 27.25 kg/m    Subjective:    Patient ID: Lucas Weiss, male    DOB: Aug 25, 1956, 60 y.o.   MRN: [id number]  HPI: Lucas RossettiSteven M Keehan is a 60 y.o. male  Chief Complaint  Patient presents with  . Depression    Patient stopped his antidepressants, he states that they made him feel bad  . Diabetes    Patient states that the Metformin made him feel horrible, so he stopped it   DEPRESSION- stopped all his medication. States that he will not take it any more because he's just depressed over his back. Mood status: uncontrolled Satisfied with current treatment?: yes Symptom severity: severe  Duration of current treatment : years Side effects: no Medication compliance: poor compliance Psychotherapy/counseling: no  Previous psychiatric medications: abilify, amitryptiline, celexa, cymbalta, seroquel and zoloft Depressed mood: yes Anxious mood: yes Anhedonia: yes Significant weight loss or gain: no Insomnia: yes hard to fall asleep Fatigue: yes Feelings of worthlessness or guilt: yes Impaired concentration/indecisiveness: yes Suicidal ideations: no Hopelessness: yes Crying spells: no [facility] 11/16/2016 10/12/2016 09/14/2016 04/07/2016 02/06/2016  Decreased Interest 3 3 3 3 2   Down, Depressed, Hopeless 3 3 3 2 3   PHQ - 2 Score 6 6 6 5 5   Altered sleeping 3 3 3 3 3   Tired, decreased energy 3 3 3 3 3   Change in appetite - 3 0 1 3  Feeling bad or failure about yourself  3 3 2 3 2   Trouble concentrating 3 3 3 3 3   Moving slowly or fidgety/restless 3 2 2 2 2   Suicidal thoughts 0 0 0 0 0  PHQ-9 Score 21 23 19 20 21   Difficult doing work/chores - - - - Very difficult   Didn't like the metformin. Felt like it was coming through his poor, felt like it made him sick, so stopped taking it.    Relevant past  medical, surgical, family and social history reviewed and updated as indicated. Interim medical history since our last visit reviewed. Allergies and medications reviewed and updated.  Review of Systems  Constitutional: Negative.   HENT: Negative.   Respiratory: Positive for cough, shortness of breath and wheezing. Negative for apnea, choking, chest tightness and stridor.   Cardiovascular: Negative.   Musculoskeletal: Positive for arthralgias, back pain, gait problem and myalgias. Negative for joint swelling, neck pain and neck stiffness.  Neurological: Negative for dizziness, tremors, seizures, syncope, facial asymmetry, speech difficulty, weakness, light-headedness, numbness and headaches.  Psychiatric/Behavioral: Positive for decreased concentration, dysphoric mood and sleep disturbance. Negative for agitation, behavioral problems, confusion, hallucinations, self-injury and suicidal ideas. The patient is nervous/anxious. The patient is not hyperactive.     Per HPI unless specifically indicated above     Objective:    BP (!) 152/86 (BP Location: Left Arm, Patient Position: Sitting, Cuff Size: Normal)   Pulse 79   Temp 98.6 F (37 C)   Wt 189 lb 14.4 oz (86.1 kg)   SpO2 100%   BMI 27.25 kg/m   Wt Readings from Last 3 Encounters:  11/16/16 189 lb 14.4 oz (86.1 kg)  10/12/16 191 lb (86.6 kg)  09/14/16 187 lb (84.8 kg)    Physical Exam  Constitutional: He is oriented to person, place, and  time. He appears well-developed and well-nourished. No distress.  HENT:  Head: Normocephalic and atraumatic.  Right Ear: Hearing normal.  Left Ear: Hearing normal.  Nose: Nose normal.  Eyes: Conjunctivae and lids are normal. Right eye exhibits no discharge. Left eye exhibits no discharge. No scleral icterus.  Cardiovascular: Normal rate, regular rhythm, normal heart sounds and intact distal pulses.  Exam reveals no gallop and no friction rub.   No murmur heard. Pulmonary/Chest: Effort normal.  No respiratory distress. He has wheezes in the right upper field, the right middle field, the right lower field, the left upper field, the left middle field and the left lower field. He has rhonchi in the right upper field, the right middle field, the right lower field, the left upper field, the left middle field and the left lower field. He has no rales. He exhibits no tenderness.  Musculoskeletal: He exhibits tenderness. He exhibits no edema or deformity.  Neurological: He is alert and oriented to person, place, and time.  Skin: Skin is warm, dry and intact. No rash noted. No erythema. No pallor.  Psychiatric: He has a normal mood and affect. His speech is normal and behavior is normal. Judgment and thought content normal. Cognition and memory are normal.  Nursing note and vitals reviewed.   Results for orders placed or performed in visit on 10/12/16  Bayer DCA Hb A1c Waived  Result Value Ref Range   Bayer DCA Hb A1c Waived 9.5 (H) <7.0 %      Assessment & Plan:   Problem List Items Addressed This Visit      Endocrine   Uncontrolled diabetes mellitus (HCC)    Took himself off his metformin. Encouraged diet and exercise. Will recheck A1c in 2 months at follow up.         Other   Severe recurrent major depression (HCC)    Took himself off all his medication. Does not want to go back on it. Does not want to see a counselor. Does not want to see a psychiatrist. Will call if he wants to go back on medication.       Chronic pain    Likely multi-factorial. Neurosurgery sees no need for surgery. He declines injections. Will get him started on naproxen when he finished his prednisone. Recheck 2 months at DM visit. Order for PT given today.      Relevant Medications   naproxen (NAPROSYN) 500 MG tablet   predniSONE (DELTASONE) 10 MG tablet    Other Visit Diagnoses    Bronchitis    -  Primary   Will get him on prednisone taper and z-pack. Return in 2 weeks for recheck on his lungs. Call  with any concerns.    Relevant Medications   albuterol (PROVENTIL) (2.5 MG/3ML) 0.083% nebulizer solution 2.5 mg       Follow up plan: Return in about 2 weeks (around 11/30/2016) for Lung recheck.

## 2016-11-16 NOTE — Assessment & Plan Note (Signed)
Likely multi-factorial. Neurosurgery sees no need for surgery. He declines injections. Will get him started on naproxen when he finished his prednisone. Recheck 2 months at DM visit. Order for PT given today.

## 2016-11-16 NOTE — Progress Notes (Incomplete)
BP (!) 152/86 (BP Location: Left Arm, Patient Position: Sitting, Cuff Size: Normal)   Pulse 79   Temp 98.6 F (37 C)   Wt 189 lb 14.4 oz (86.1 kg)   SpO2 100%   BMI 27.25 kg/Weiss    Subjective:    Patient ID: Lucas Weiss Kipnis, male    DOB: Apr 19, 1956, 60 y.o.   MRN: [id number]  HPI: Lucas Weiss Ortman is a 60 y.o. male  Chief Complaint  Patient presents with  . Depression    Patient stopped his antidepressants, he states that they made him feel bad  . Diabetes    Patient states that the Metformin made him feel horrible, so he stopped it   DEPRESSION- stopped all his medication. States that he will not take it any more because he's just depressed over his back. Mood status: uncontrolled Satisfied with current treatment?: yes Symptom severity: severe  Duration of current treatment : years Side effects: no Medication compliance: poor compliance Psychotherapy/counseling: no  Previous psychiatric medications: {Blank multiple:19196::"abilify","amitryptiline","buspar","celexa","cymbalta","depakote","effexor","lamictal","lexapro","lithium","nortryptiline","paxil","prozac","pristiq (desvenlafaxine","seroquel","wellbutrin","zoloft","zyprexa"} Depressed mood: yes Anxious mood: yes Anhedonia: yes Significant weight loss or gain: no Insomnia: yes hard to fall asleep Fatigue: yes Feelings of worthlessness or guilt: yes Impaired concentration/indecisiveness: yes Suicidal ideations: no Hopelessness: yes Crying spells: no [facility] 11/16/2016 10/12/2016 09/14/2016 04/07/2016 02/06/2016  Decreased Interest 3 3 3 3 2   Down, Depressed, Hopeless 3 3 3 2 3   PHQ - 2 Score 6 6 6 5 5   Altered sleeping 3 3 3 3 3   Tired, decreased energy 3 3 3 3 3   Change in appetite - 3 0 1 3  Feeling bad or failure about yourself  3 3 2 3 2   Trouble concentrating 3 3 3 3 3   Moving slowly or fidgety/restless 3 2 2 2 2   Suicidal thoughts 0 0 0 0 0  PHQ-9 Score 21 23 19 20 21   Difficult doing  work/chores - - - - Very difficult   Didn't like the metformin. Felt like it was coming through his poor, felt like it made him sick, so stopped taking it.   Relevant past medical, surgical, family and social history reviewed and updated as indicated. Interim medical history since our last visit reviewed. Allergies and medications reviewed and updated.  Review of Systems  Constitutional: Negative.   Respiratory: Positive for cough, shortness of breath and wheezing. Negative for apnea, choking, chest tightness and stridor.   Cardiovascular: Negative.   Musculoskeletal: Positive for arthralgias, back pain, gait problem and myalgias. Negative for joint swelling, neck pain and neck stiffness.    Per HPI unless specifically indicated above     Objective:    BP (!) 152/86 (BP Location: Left Arm, Patient Position: Sitting, Cuff Size: Normal)   Pulse 79   Temp 98.6 F (37 C)   Wt 189 lb 14.4 oz (86.1 kg)   SpO2 100%   BMI 27.25 kg/Weiss   Wt Readings from Last 3 Encounters:  11/16/16 189 lb 14.4 oz (86.1 kg)  10/12/16 191 lb (86.6 kg)  09/14/16 187 lb (84.8 kg)    Physical Exam  Constitutional: He is oriented to person, place, and time. He appears well-developed and well-nourished. No distress.  HENT:  Head: Normocephalic and atraumatic.  Right Ear: Hearing normal.  Left Ear: Hearing normal.  Nose: Nose normal.  Eyes: Conjunctivae and lids are normal. Right eye exhibits no discharge. Left eye exhibits no discharge. No scleral icterus.  Cardiovascular: Normal rate, regular rhythm, normal heart sounds  and intact distal pulses.  Exam reveals no gallop and no friction rub.   No murmur heard. Pulmonary/Chest: Effort normal. No respiratory distress. He has wheezes in the right upper field, the right middle field, the right lower field, the left upper field, the left middle field and the left lower field. He has rhonchi in the right upper field, the right middle field, the right lower field,  the left upper field, the left middle field and the left lower field. He has no rales. He exhibits no tenderness.  Musculoskeletal: Normal range of motion.  Neurological: He is alert and oriented to person, place, and time.  Skin: Skin is intact. No rash noted.  Psychiatric: He has a normal mood and affect. His speech is normal and behavior is normal. Judgment and thought content normal. Cognition and memory are normal.  Nursing note and vitals reviewed.   Results for orders placed or performed in visit on 10/12/16  Bayer DCA Hb A1c Waived  Result Value Ref Range   Bayer DCA Hb A1c Waived 9.5 (H) <7.0 %      Assessment & Plan:   Problem List Items Addressed This Visit      Endocrine   Uncontrolled diabetes mellitus (HCC)     Other   Severe recurrent major depression (HCC)    Other Visit Diagnoses    Bronchitis    -  Primary   Relevant Medications   albuterol (PROVENTIL) (2.5 MG/3ML) 0.083% nebulizer solution 2.5 mg (Start on 11/16/2016  2:30 PM)       Follow up plan: Return in about 2 weeks (around 11/30/2016) for Lung recheck.

## 2016-11-16 NOTE — Assessment & Plan Note (Signed)
Took himself off his metformin. Encouraged diet and exercise. Will recheck A1c in 2 months at follow up.

## 2016-11-17 LAB — COMPREHENSIVE METABOLIC PANEL
A/G RATIO: 1.6 (ref 1.2–2.2)
ALBUMIN: 4.1 g/dL (ref 3.5–5.5)
ALK PHOS: 90 IU/L (ref 39–117)
ALT: 26 IU/L (ref 0–44)
AST: 19 IU/L (ref 0–40)
BILIRUBIN TOTAL: 0.4 mg/dL (ref 0.0–1.2)
BUN / CREAT RATIO: 14 (ref 9–20)
BUN: 16 mg/dL (ref 6–24)
CHLORIDE: 95 mmol/L — AB (ref 96–106)
CO2: 19 mmol/L (ref 18–29)
Calcium: 9.3 mg/dL (ref 8.7–10.2)
Creatinine, Ser: 1.17 mg/dL (ref 0.76–1.27)
GFR calc Af Amer: 79 mL/min/{1.73_m2} (ref >59)
GFR calc non Af Amer: 68 mL/min/{1.73_m2} (ref >59)
GLUCOSE: 455 mg/dL — AB (ref 65–99)
Globulin, Total: 2.6 g/dL (ref 1.5–4.5)
POTASSIUM: 4.7 mmol/L (ref 3.5–5.2)
Sodium: 134 mmol/L (ref 134–144)
Total Protein: 6.7 g/dL (ref 6.0–8.5)

## 2016-11-19 ENCOUNTER — Telehealth: Payer: Self-pay | Admitting: Family Medicine

## 2016-11-19 MED ORDER — METFORMIN HCL 500 MG PO TABS: 500.0000 mg | ORAL_TABLET | Freq: Two times a day (BID) | ORAL | 3 refills | Status: DC

## 2016-11-19 NOTE — Telephone Encounter (Signed)
Please let Viviann SpareSteven know that his sugar was really high, and he needs to go back on his metformin. We'll start him back on a lower dose and he should let me know if he feels sick on it and we can change to something else, but he needs to be back on medicine.

## 2016-11-19 NOTE — Telephone Encounter (Signed)
Patient notified

## 2016-12-01 ENCOUNTER — Encounter: Payer: Self-pay | Admitting: Family Medicine

## 2016-12-01 ENCOUNTER — Ambulatory Visit (INDEPENDENT_AMBULATORY_CARE_PROVIDER_SITE_OTHER): Payer: Medicare Other | Admitting: Family Medicine

## 2016-12-01 VITALS — BP 129/80 | HR 77 | Temp 98.4°F | Wt 190.1 lb

## 2016-12-01 DIAGNOSIS — E1165 Type 2 diabetes mellitus with hyperglycemia: Secondary | ICD-10-CM | POA: Diagnosis not present

## 2016-12-01 DIAGNOSIS — I701 Atherosclerosis of renal artery: Secondary | ICD-10-CM

## 2016-12-01 DIAGNOSIS — J40 Bronchitis, not specified as acute or chronic: Secondary | ICD-10-CM | POA: Diagnosis not present

## 2016-12-01 NOTE — Assessment & Plan Note (Signed)
Restarted metformin. Call with any concerns. Tolerating it well. Recheck A1c in 2 months.

## 2016-12-01 NOTE — Progress Notes (Signed)
BP 129/80 (BP Location: Left Arm, Patient Position: Sitting, Cuff Size: Normal)   Pulse 77   Temp 98.4 F (36.9 C)   Wt 190 lb 1.6 oz (86.2 kg)   SpO2 98%   BMI 27.28 kg/m    Subjective:    Patient ID: Lucas Weiss, male    DOB: 17-Jun-1956, 60 y.o.   MRN: [id number]  HPI: Lucas Weiss is a 60 y.o. male  Chief Complaint  Patient presents with  . Lung Recheck   Breathing is back to normal. Feeling better. Finished his medications. No concerns.   DIABETES- back on the metformin. Tolerating it well. No diarrhea.  Hypoglycemic episodes:no Polydipsia/polyuria: no Visual disturbance: no Chest pain: no Paresthesias: no Glucose Monitoring: no Taking Insulin?: no Blood Pressure Monitoring: not checking Retinal Examination: Not up to Date Foot Exam: Up to Date Diabetic Education: Not Completed Pneumovax: Up to Date Influenza: Up to Date Aspirin: yes  Relevant past medical, surgical, family and social history reviewed and updated as indicated. Interim medical history since our last visit reviewed. Allergies and medications reviewed and updated.  Review of Systems  Constitutional: Negative.   Respiratory: Negative.   Cardiovascular: Negative.   Psychiatric/Behavioral: Negative.     Per HPI unless specifically indicated above     Objective:    BP 129/80 (BP Location: Left Arm, Patient Position: Sitting, Cuff Size: Normal)   Pulse 77   Temp 98.4 F (36.9 C)   Wt 190 lb 1.6 oz (86.2 kg)   SpO2 98%   BMI 27.28 kg/m   Wt Readings from Last 3 Encounters:  12/01/16 190 lb 1.6 oz (86.2 kg)  11/16/16 189 lb 14.4 oz (86.1 kg)  10/12/16 191 lb (86.6 kg)    Physical Exam  Constitutional: He is oriented to person, place, and time. He appears well-developed and well-nourished. No distress.  HENT:  Head: Normocephalic and atraumatic.  Right Ear: Hearing normal.  Left Ear: Hearing normal.  Nose: Nose normal.  Eyes: Conjunctivae and lids are normal. Right eye exhibits  no discharge. Left eye exhibits no discharge. No scleral icterus.  Cardiovascular: Normal rate, regular rhythm, normal heart sounds and intact distal pulses.  Exam reveals no gallop and no friction rub.   No murmur heard. Pulmonary/Chest: Effort normal and breath sounds normal. No respiratory distress. He has no wheezes. He has no rales. He exhibits no tenderness.  Musculoskeletal: Normal range of motion.  Neurological: He is alert and oriented to person, place, and time.  Skin: Skin is warm, dry and intact. No rash noted. No erythema. No pallor.  Psychiatric: He has a normal mood and affect. His speech is normal and behavior is normal. Judgment and thought content normal. Cognition and memory are normal.  Nursing note and vitals reviewed.   Results for orders placed or performed in visit on 11/16/16  Comprehensive metabolic panel  Result Value Ref Range   Glucose 455 (H) 65 - 99 mg/dL   BUN 16 6 - 24 mg/dL   Creatinine, Ser 5.621.17 0.76 - 1.27 mg/dL   GFR calc non Af Amer 68 >59 mL/min/1.73   GFR calc Af Amer 79 >59 mL/min/1.73   BUN/Creatinine Ratio 14 9 - 20   Sodium 134 134 - 144 mmol/L   Potassium 4.7 3.5 - 5.2 mmol/L   Chloride 95 (L) 96 - 106 mmol/L   CO2 19 18 - 29 mmol/L   Calcium 9.3 8.7 - 10.2 mg/dL   Total Protein 6.7 6.0 - 8.5  g/dL   Albumin 4.1 3.5 - 5.5 g/dL   Globulin, Total 2.6 1.5 - 4.5 g/dL   Albumin/Globulin Ratio 1.6 1.2 - 2.2   Bilirubin Total 0.4 0.0 - 1.2 mg/dL   Alkaline Phosphatase 90 39 - 117 IU/L   AST 19 0 - 40 IU/L   ALT 26 0 - 44 IU/L      Assessment & Plan:   Problem List Items Addressed This Visit      Endocrine   Uncontrolled diabetes mellitus (HCC)    Restarted metformin. Call with any concerns. Tolerating it well. Recheck A1c in 2 months.        Other Visit Diagnoses    Bronchitis    -  Primary   Resolved. Lungs clear. Call with any concerns.        Follow up plan: Return in about 2 months (around 02/01/2017) for DM  visit.

## 2016-12-03 DIAGNOSIS — I701 Atherosclerosis of renal artery: Secondary | ICD-10-CM | POA: Diagnosis not present

## 2016-12-03 DIAGNOSIS — E785 Hyperlipidemia, unspecified: Secondary | ICD-10-CM | POA: Diagnosis not present

## 2016-12-03 DIAGNOSIS — Z951 Presence of aortocoronary bypass graft: Secondary | ICD-10-CM | POA: Diagnosis not present

## 2016-12-03 DIAGNOSIS — I639 Cerebral infarction, unspecified: Secondary | ICD-10-CM | POA: Diagnosis not present

## 2016-12-03 DIAGNOSIS — F329 Major depressive disorder, single episode, unspecified: Secondary | ICD-10-CM | POA: Diagnosis not present

## 2016-12-03 DIAGNOSIS — I25118 Atherosclerotic heart disease of native coronary artery with other forms of angina pectoris: Secondary | ICD-10-CM | POA: Diagnosis not present

## 2017-02-04 ENCOUNTER — Ambulatory Visit (INDEPENDENT_AMBULATORY_CARE_PROVIDER_SITE_OTHER): Payer: Medicare Other | Admitting: Family Medicine

## 2017-02-04 ENCOUNTER — Encounter: Payer: Self-pay | Admitting: Family Medicine

## 2017-02-04 VITALS — BP 119/86 | HR 82 | Temp 99.1°F | Resp 17 | Ht 70.0 in | Wt 193.0 lb

## 2017-02-04 DIAGNOSIS — I739 Peripheral vascular disease, unspecified: Secondary | ICD-10-CM

## 2017-02-04 DIAGNOSIS — F17201 Nicotine dependence, unspecified, in remission: Secondary | ICD-10-CM

## 2017-02-04 DIAGNOSIS — E782 Mixed hyperlipidemia: Secondary | ICD-10-CM | POA: Diagnosis not present

## 2017-02-04 DIAGNOSIS — M87052 Idiopathic aseptic necrosis of left femur: Secondary | ICD-10-CM

## 2017-02-04 DIAGNOSIS — Z96642 Presence of left artificial hip joint: Secondary | ICD-10-CM

## 2017-02-04 DIAGNOSIS — I701 Atherosclerosis of renal artery: Secondary | ICD-10-CM | POA: Diagnosis not present

## 2017-02-04 DIAGNOSIS — G894 Chronic pain syndrome: Secondary | ICD-10-CM

## 2017-02-04 DIAGNOSIS — I25119 Atherosclerotic heart disease of native coronary artery with unspecified angina pectoris: Secondary | ICD-10-CM | POA: Diagnosis not present

## 2017-02-04 DIAGNOSIS — Z951 Presence of aortocoronary bypass graft: Secondary | ICD-10-CM

## 2017-02-04 DIAGNOSIS — I70219 Atherosclerosis of native arteries of extremities with intermittent claudication, unspecified extremity: Secondary | ICD-10-CM | POA: Diagnosis not present

## 2017-02-04 DIAGNOSIS — E1165 Type 2 diabetes mellitus with hyperglycemia: Secondary | ICD-10-CM | POA: Diagnosis not present

## 2017-02-04 DIAGNOSIS — I10 Essential (primary) hypertension: Secondary | ICD-10-CM | POA: Diagnosis not present

## 2017-02-04 DIAGNOSIS — I110 Hypertensive heart disease with heart failure: Secondary | ICD-10-CM | POA: Diagnosis not present

## 2017-02-04 DIAGNOSIS — I209 Angina pectoris, unspecified: Secondary | ICD-10-CM

## 2017-02-04 MED ORDER — NAPROXEN 500 MG PO TABS: 500.0000 mg | ORAL_TABLET | Freq: Two times a day (BID) | ORAL | 3 refills | Status: DC

## 2017-02-04 MED ORDER — METOPROLOL TARTRATE 25 MG PO TABS: ORAL_TABLET | ORAL | 1 refills | Status: DC

## 2017-02-04 MED ORDER — METFORMIN HCL 500 MG PO TABS: 1000.0000 mg | ORAL_TABLET | Freq: Two times a day (BID) | ORAL | 1 refills | Status: DC

## 2017-02-04 MED ORDER — LOVASTATIN 20 MG PO TABS: 40.0000 mg | ORAL_TABLET | Freq: Every day | ORAL | 1 refills | Status: DC

## 2017-02-04 MED ORDER — LISINOPRIL 5 MG PO TABS: 5.0000 mg | ORAL_TABLET | Freq: Every day | ORAL | 1 refills | Status: DC

## 2017-02-04 NOTE — Assessment & Plan Note (Signed)
Encouraged continued abstinence. Continue to monitor.

## 2017-02-04 NOTE — Assessment & Plan Note (Signed)
S/p hip replacement. Follow up with ortho as needed.

## 2017-02-04 NOTE — Progress Notes (Signed)
BP 119/86 (BP Location: Right Arm, Patient Position: Sitting, Cuff Size: Normal)   Pulse 82   Temp 99.1 F (37.3 C) (Oral)   Resp 17   Ht 5\' 10"  (1.778 m)   Wt 193 lb (87.5 kg)   SpO2 (!) 82%   BMI 27.69 kg/m    Subjective:    Patient ID: Lucas Weiss, male    DOB: Nov 25, 1956, 61 y.o.   MRN: [id number]  HPI: Lucas RossettiSteven M Gruner is a 61 y.o. male  Chief Complaint  Patient presents with  . Diabetes    2 month f/u   HYPERTENSION / HYPERLIPIDEMIA Satisfied with current treatment? yes Duration of hypertension: chronic BP monitoring frequency: not checking BP medication side effects: no Past BP meds: lisinopril, metoprolol Duration of hyperlipidemia: chronic Cholesterol medication side effects: no Cholesterol supplements: none Past cholesterol medications: lovastatin (mevacor) and pravastatin (pravachol) Medication compliance: excellent compliance Aspirin: yes Recent stressors: yes Recurrent headaches: no Visual changes: no Palpitations: no Dyspnea: no Chest pain: no Lower extremity edema: no Dizzy/lightheaded: no  DIABETES Hypoglycemic episodes:no Polydipsia/polyuria: no Visual disturbance: no Chest pain: no Paresthesias: no Glucose Monitoring: no Taking Insulin?: no Blood Pressure Monitoring: not checking Retinal Examination: Not up to Date Foot Exam: Up to Date Diabetic Education: Completed Pneumovax: Up to Date Influenza: Up to Date Aspirin: yes  CHRONIC PAIN - still stable. Doesn't want to see anyone else. Still in pain. Doesn't have any naproxen right now.   Mood stable right now. Still having a lot of issues with his divorce. Going to court.  Relevant past medical, surgical, family and social history reviewed and updated as indicated. Interim medical history since our last visit reviewed. Allergies and medications reviewed and updated.  Review of Systems  Constitutional: Negative.   Respiratory: Negative.   Cardiovascular: Negative.     Musculoskeletal: Positive for back pain and myalgias. Negative for arthralgias, gait problem, joint swelling, neck pain and neck stiffness.  Psychiatric/Behavioral: Positive for agitation and dysphoric mood. Negative for behavioral problems, confusion, decreased concentration, hallucinations, self-injury, sleep disturbance and suicidal ideas. The patient is nervous/anxious. The patient is not hyperactive.     Per HPI unless specifically indicated above     Objective:    BP 119/86 (BP Location: Right Arm, Patient Position: Sitting, Cuff Size: Normal)   Pulse 82   Temp 99.1 F (37.3 C) (Oral)   Resp 17   Ht 5\' 10"  (1.778 m)   Wt 193 lb (87.5 kg)   SpO2 (!) 82%   BMI 27.69 kg/m   Wt Readings from Last 3 Encounters:  02/04/17 193 lb (87.5 kg)  12/01/16 190 lb 1.6 oz (86.2 kg)  11/16/16 189 lb 14.4 oz (86.1 kg)    Physical Exam  Constitutional: He is oriented to person, place, and time. He appears well-developed and well-nourished. No distress.  HENT:  Head: Normocephalic and atraumatic.  Right Ear: Hearing normal.  Left Ear: Hearing normal.  Nose: Nose normal.  Eyes: Conjunctivae and lids are normal. Right eye exhibits no discharge. Left eye exhibits no discharge. No scleral icterus.  Cardiovascular: Normal rate, regular rhythm, normal heart sounds and intact distal pulses.  Exam reveals no gallop and no friction rub.   No murmur heard. Pulmonary/Chest: Effort normal and breath sounds normal. No respiratory distress. He has no wheezes. He has no rales. He exhibits no tenderness.  Musculoskeletal: Normal range of motion.  Neurological: He is alert and oriented to person, place, and time.  Skin: Skin is warm,  dry and intact. No rash noted. No erythema. No pallor.  Psychiatric: He has a normal mood and affect. His speech is normal and behavior is normal. Judgment and thought content normal. Cognition and memory are normal.  Nursing note and vitals reviewed.  Diabetic Foot Exam -  Simple   Simple Foot Form Diabetic Foot exam was performed with the following findings:  Yes 02/04/2017  3:19 PM  Visual Inspection No deformities, no ulcerations, no other skin breakdown bilaterally:  Yes Sensation Testing Intact to touch and monofilament testing bilaterally:  Yes Pulse Check Posterior Tibialis and Dorsalis pulse intact bilaterally:  Yes Comments     Results for orders placed or performed in visit on 11/16/16  Comprehensive metabolic panel  Result Value Ref Range   Glucose 455 (H) 65 - 99 mg/dL   BUN 16 6 - 24 mg/dL   Creatinine, Ser 1.61 0.76 - 1.27 mg/dL   GFR calc non Af Amer 68 >59 mL/min/1.73   GFR calc Af Amer 79 >59 mL/min/1.73   BUN/Creatinine Ratio 14 9 - 20   Sodium 134 134 - 144 mmol/L   Potassium 4.7 3.5 - 5.2 mmol/L   Chloride 95 (L) 96 - 106 mmol/L   CO2 19 18 - 29 mmol/L   Calcium 9.3 8.7 - 10.2 mg/dL   Total Protein 6.7 6.0 - 8.5 g/dL   Albumin 4.1 3.5 - 5.5 g/dL   Globulin, Total 2.6 1.5 - 4.5 g/dL   Albumin/Globulin Ratio 1.6 1.2 - 2.2   Bilirubin Total 0.4 0.0 - 1.2 mg/dL   Alkaline Phosphatase 90 39 - 117 IU/L   AST 19 0 - 40 IU/L   ALT 26 0 - 44 IU/L      Assessment & Plan:   Problem List Items Addressed This Visit      Cardiovascular and Mediastinum   CAD (coronary artery disease)    No chest pain. Continue to follow with cardiology. Continue medical management.       Relevant Medications   aspirin EC 81 MG tablet   metoprolol tartrate (LOPRESSOR) 25 MG tablet   lovastatin (MEVACOR) 20 MG tablet   lisinopril (PRINIVIL,ZESTRIL) 5 MG tablet   PAD (peripheral artery disease): Left common iliac stent plus additional stent(09/15/12) for ISRS.    Continue to monitor. Continue to follow with vascular. Call with any concerns. Continue medical management.       Relevant Medications   aspirin EC 81 MG tablet   metoprolol tartrate (LOPRESSOR) 25 MG tablet   lovastatin (MEVACOR) 20 MG tablet   lisinopril (PRINIVIL,ZESTRIL) 5 MG  tablet   Hypertensive heart failure (HCC) - Primary    Not fluid over-loaded. BP under good control. Continue current regimen. Continue to monitor. Call with any concerns.       Relevant Medications   aspirin EC 81 MG tablet   metoprolol tartrate (LOPRESSOR) 25 MG tablet   lovastatin (MEVACOR) 20 MG tablet   lisinopril (PRINIVIL,ZESTRIL) 5 MG tablet   Other Relevant Orders   Comprehensive metabolic panel   Microalbumin, Urine Waived   Renal artery stenosis, native, bilateral (HCC)    BP under good control today. Continue current regimen. Continue to monitor. Call with any concerns.       Relevant Medications   aspirin EC 81 MG tablet   metoprolol tartrate (LOPRESSOR) 25 MG tablet   lovastatin (MEVACOR) 20 MG tablet   lisinopril (PRINIVIL,ZESTRIL) 5 MG tablet   Atherosclerotic PVD with intermittent claudication (HCC)    Continue to  follow with vascular. Call with any concerns. Stable right now.      Relevant Medications   aspirin EC 81 MG tablet   metoprolol tartrate (LOPRESSOR) 25 MG tablet   lovastatin (MEVACOR) 20 MG tablet   lisinopril (PRINIVIL,ZESTRIL) 5 MG tablet     Endocrine   Uncontrolled diabetes mellitus (HCC)    A1c came back at 9.0 today. Continue current regimen. Continue to monitor. Call with any concerns.       Relevant Medications   aspirin EC 81 MG tablet   metFORMIN (GLUCOPHAGE) 500 MG tablet   lovastatin (MEVACOR) 20 MG tablet   lisinopril (PRINIVIL,ZESTRIL) 5 MG tablet   Other Relevant Orders   Bayer DCA Hb A1c Waived   Comprehensive metabolic panel     Musculoskeletal and Integument   Avascular necrosis of bone of left hip (HCC)    S/p hip replacement. Follow up with ortho as needed.         Other   S/P CABG x 5: 2009: LIMA to LAD, SVG to Diag, Seq SVG to OM2-3, SVG to PDA    Continue medical management. Continue to monitor. No symptoms at this time.       HLD (hyperlipidemia)    Rechecking levels today. Treat as needed. Continue to  monitor.       Relevant Medications   aspirin EC 81 MG tablet   metoprolol tartrate (LOPRESSOR) 25 MG tablet   lovastatin (MEVACOR) 20 MG tablet   lisinopril (PRINIVIL,ZESTRIL) 5 MG tablet   Other Relevant Orders   Comprehensive metabolic panel   Lipid Panel w/o Chol/HDL Ratio   Chronic pain    Not under great control. Will restart naproxen and recheck at follow up.       Relevant Medications   aspirin EC 81 MG tablet   naproxen (NAPROSYN) 500 MG tablet   Tobacco abuse, in remission    Encouraged continued abstinence. Continue to monitor.       Status post total replacement of left hip    Continue to follow with ortho as needed. Call with any concerns.           Follow up plan: Return in about 3 months (around 05/04/2017) for DM visit.

## 2017-02-04 NOTE — Assessment & Plan Note (Signed)
BP under good control today. Continue current regimen. Continue to monitor. Call with any concerns.

## 2017-02-04 NOTE — Assessment & Plan Note (Addendum)
A1c came back at 9.0 today. Continue current regimen. Continue to monitor. Call with any concerns.

## 2017-02-04 NOTE — Assessment & Plan Note (Signed)
Not under great control. Will restart naproxen and recheck at follow up.

## 2017-02-04 NOTE — Assessment & Plan Note (Signed)
Continue to follow with ortho as needed. Call with any concerns.

## 2017-02-04 NOTE — Assessment & Plan Note (Signed)
No chest pain. Continue to follow with cardiology. Continue medical management.

## 2017-02-04 NOTE — Assessment & Plan Note (Signed)
Rechecking levels today. Treat as needed. Continue to monitor.

## 2017-02-04 NOTE — Assessment & Plan Note (Signed)
Not fluid over-loaded. BP under good control. Continue current regimen. Continue to monitor. Call with any concerns.

## 2017-02-04 NOTE — Assessment & Plan Note (Signed)
Continue to follow with vascular. Call with any concerns. Stable right now.

## 2017-02-04 NOTE — Assessment & Plan Note (Signed)
Continue medical management. Continue to monitor. No symptoms at this time.

## 2017-02-04 NOTE — Assessment & Plan Note (Signed)
Continue to monitor. Continue to follow with vascular. Call with any concerns. Continue medical management.

## 2017-02-05 ENCOUNTER — Telehealth: Payer: Self-pay | Admitting: Family Medicine

## 2017-02-05 LAB — COMPREHENSIVE METABOLIC PANEL
A/G RATIO: 1.6 (ref 1.2–2.2)
ALBUMIN: 4.3 g/dL (ref 3.5–5.5)
ALT: 28 IU/L (ref 0–44)
AST: 14 IU/L (ref 0–40)
Alkaline Phosphatase: 81 IU/L (ref 39–117)
BUN / CREAT RATIO: 17 (ref 9–20)
BUN: 18 mg/dL (ref 6–24)
Bilirubin Total: 0.3 mg/dL (ref 0.0–1.2)
CALCIUM: 9.2 mg/dL (ref 8.7–10.2)
CO2: 20 mmol/L (ref 18–29)
CREATININE: 1.07 mg/dL (ref 0.76–1.27)
Chloride: 98 mmol/L (ref 96–106)
GFR, EST AFRICAN AMERICAN: 88 mL/min/{1.73_m2} (ref >59)
GFR, EST NON AFRICAN AMERICAN: 76 mL/min/{1.73_m2} (ref >59)
GLOBULIN, TOTAL: 2.7 g/dL (ref 1.5–4.5)
Glucose: 236 mg/dL — ABNORMAL HIGH (ref 65–99)
POTASSIUM: 4.2 mmol/L (ref 3.5–5.2)
SODIUM: 139 mmol/L (ref 134–144)
TOTAL PROTEIN: 7 g/dL (ref 6.0–8.5)

## 2017-02-05 LAB — BAYER DCA HB A1C WAIVED: HB A1C (BAYER DCA - WAIVED): 9 % — ABNORMAL HIGH (ref <7.0)

## 2017-02-05 LAB — LIPID PANEL W/O CHOL/HDL RATIO
Cholesterol, Total: 214 mg/dL — ABNORMAL HIGH (ref 100–199)
HDL: 35 mg/dL — ABNORMAL LOW (ref >39)
LDL CALC: 101 mg/dL — AB (ref 0–99)
Triglycerides: 391 mg/dL — ABNORMAL HIGH (ref 0–149)
VLDL Cholesterol Cal: 78 mg/dL — ABNORMAL HIGH (ref 5–40)

## 2017-02-05 LAB — MICROALBUMIN, URINE WAIVED
Creatinine, Urine Waived: 200 mg/dL (ref 10–300)
MICROALB, UR WAIVED: 150 mg/L — AB (ref 0–19)

## 2017-02-05 NOTE — Telephone Encounter (Signed)
Please let Viviann SpareSteven know that all his labs look better than last time. Thanks!

## 2017-02-05 NOTE — Telephone Encounter (Signed)
Informed patient that all labs looked better than last time.

## 2017-02-16 ENCOUNTER — Telehealth: Payer: Self-pay | Admitting: Family Medicine

## 2017-02-16 NOTE — Telephone Encounter (Signed)
Pt states that he has several missed calls from our office.  Pt thinks Dr. Laural BenesJohnson may be trying to reach him.  Please call patient at [phone].

## 2017-02-17 NOTE — Telephone Encounter (Signed)
I am not trying to reach him.

## 2017-02-18 NOTE — Telephone Encounter (Signed)
Tried to call patient to see if we could figure out what is going on but his voice mail is not set up yet. Will keep trying.

## 2017-02-19 NOTE — Telephone Encounter (Signed)
I see no reason why we would have been trying to reach this patient. Voice mail not set up and have tried numerous times to touch base with patient. Will close out this encounter.

## 2017-04-29 ENCOUNTER — Encounter (INDEPENDENT_AMBULATORY_CARE_PROVIDER_SITE_OTHER): Payer: Self-pay

## 2017-04-29 ENCOUNTER — Ambulatory Visit (INDEPENDENT_AMBULATORY_CARE_PROVIDER_SITE_OTHER): Payer: Medicare Other | Admitting: Physician Assistant

## 2017-04-29 ENCOUNTER — Ambulatory Visit (INDEPENDENT_AMBULATORY_CARE_PROVIDER_SITE_OTHER): Payer: Medicare Other

## 2017-04-29 DIAGNOSIS — I701 Atherosclerosis of renal artery: Secondary | ICD-10-CM | POA: Diagnosis not present

## 2017-04-29 DIAGNOSIS — Z96642 Presence of left artificial hip joint: Secondary | ICD-10-CM

## 2017-04-29 NOTE — Progress Notes (Signed)
[name] returns today 1 year status post left total hip arthroplasty. He is overall doing well. He states he has a sticking sensation in the anterior aspect of his left thigh at times this happens approximately once a week. He states overall his pain is much improved from when he had preop and he is very happy with the left total hip.  Left hip he has excellent range of motion of the hip without pain. In place without any assistive device. He has a normal gait. Left calf supple nontender.  Radiographs: AP pelvis lateral view of left hip shows the left total hip components to be well seated. There is no signs of eccentric wear. No signs of loosening. No acute fractures.  Plan: He'll follow up on an as-needed basis. Questions encouraged and answered.

## 2017-05-06 ENCOUNTER — Encounter: Payer: Self-pay | Admitting: Family Medicine

## 2017-05-06 ENCOUNTER — Ambulatory Visit (INDEPENDENT_AMBULATORY_CARE_PROVIDER_SITE_OTHER): Payer: Medicare Other | Admitting: Family Medicine

## 2017-05-06 VITALS — BP 132/68 | HR 66 | Wt 196.0 lb

## 2017-05-06 DIAGNOSIS — E1165 Type 2 diabetes mellitus with hyperglycemia: Secondary | ICD-10-CM

## 2017-05-06 DIAGNOSIS — G4700 Insomnia, unspecified: Secondary | ICD-10-CM | POA: Diagnosis not present

## 2017-05-06 DIAGNOSIS — I110 Hypertensive heart disease with heart failure: Secondary | ICD-10-CM

## 2017-05-06 DIAGNOSIS — Z1211 Encounter for screening for malignant neoplasm of colon: Secondary | ICD-10-CM | POA: Diagnosis not present

## 2017-05-06 DIAGNOSIS — E782 Mixed hyperlipidemia: Secondary | ICD-10-CM

## 2017-05-06 DIAGNOSIS — I701 Atherosclerosis of renal artery: Secondary | ICD-10-CM | POA: Diagnosis not present

## 2017-05-06 MED ORDER — LISINOPRIL 5 MG PO TABS: 5.0000 mg | ORAL_TABLET | Freq: Every day | ORAL | 1 refills | Status: DC

## 2017-05-06 MED ORDER — METOPROLOL TARTRATE 25 MG PO TABS: ORAL_TABLET | ORAL | 1 refills | Status: DC

## 2017-05-06 MED ORDER — TRAZODONE HCL 50 MG PO TABS: 25.0000 mg | ORAL_TABLET | Freq: Every evening | ORAL | 3 refills | Status: DC | PRN

## 2017-05-06 MED ORDER — METFORMIN HCL 500 MG PO TABS: 1000.0000 mg | ORAL_TABLET | Freq: Two times a day (BID) | ORAL | 1 refills | Status: DC

## 2017-05-06 MED ORDER — LOVASTATIN 20 MG PO TABS: 40.0000 mg | ORAL_TABLET | Freq: Every day | ORAL | 1 refills | Status: DC

## 2017-05-06 NOTE — Assessment & Plan Note (Signed)
Refill given today. Due for recheck next visit.

## 2017-05-06 NOTE — Assessment & Plan Note (Addendum)
A1c came back at 7.2 today- significantly better. Continue current regimen. Continue diet and exercise. Recheck 3 months.

## 2017-05-06 NOTE — Assessment & Plan Note (Signed)
Better on recheck. Continue current regimen. Continue to monitor.  

## 2017-05-06 NOTE — Assessment & Plan Note (Signed)
Will start trazodone PRN. Call with any concerns.

## 2017-05-06 NOTE — Progress Notes (Signed)
BP 132/68   Pulse 66   Wt 196 lb (88.9 kg)   SpO2 97%   BMI 28.12 kg/m    Subjective:    Patient ID: Lucas Weiss, male    DOB: 29-Jul-1956, 61 y.o.   MRN: [id number]  HPI: Lucas Weiss is a 61 y.o. male  Chief Complaint  Patient presents with  . Diabetes   DIABETES Hypoglycemic episodes:no Polydipsia: no Polyuria: no Visual disturbance: no Chest pain: no Paresthesias: no Glucose Monitoring: no Taking Insulin?: no Blood Pressure Monitoring: not checking Retinal Examination: Not up to Date Foot Exam: Up to Date Diabetic Education: Completed Pneumovax: Up to Date Influenza: Up to Date Aspirin: yes  INSOMNIA Duration: couple of years Satisfied with sleep quality: no Difficulty falling asleep: yes Difficulty staying asleep: no Waking a few hours after sleep onset: no Early morning awakenings: no Daytime hypersomnolence: no Wakes feeling refreshed: yes Good sleep hygiene: yes Apnea: no Snoring: yes Depressed/anxious mood: yes Recent stress: yes Restless legs/nocturnal leg cramps: yes Chronic pain/arthritis: yes History of sleep study: no Treatments attempted: none    Relevant past medical, surgical, family and social history reviewed and updated as indicated. Interim medical history since our last visit reviewed. Allergies and medications reviewed and updated.  Review of Systems  Constitutional: Negative.   Respiratory: Negative.   Cardiovascular: Negative.   Psychiatric/Behavioral: Negative.     Per HPI unless specifically indicated above     Objective:    BP 132/68   Pulse 66   Wt 196 lb (88.9 kg)   SpO2 97%   BMI 28.12 kg/m   Wt Readings from Last 3 Encounters:  05/06/17 196 lb (88.9 kg)  02/04/17 193 lb (87.5 kg)  12/01/16 190 lb 1.6 oz (86.2 kg)    Physical Exam  Constitutional: He is oriented to person, place, and time. He appears well-developed and well-nourished. No distress.  HENT:  Head: Normocephalic and atraumatic.  Right  Ear: Hearing normal.  Left Ear: Hearing normal.  Nose: Nose normal.  Eyes: Conjunctivae and lids are normal. Right eye exhibits no discharge. Left eye exhibits no discharge. No scleral icterus.  Cardiovascular: Normal rate, regular rhythm, normal heart sounds and intact distal pulses.  Exam reveals no gallop and no friction rub.   No murmur heard. Pulmonary/Chest: Effort normal and breath sounds normal. No respiratory distress. He has no wheezes. He has no rales. He exhibits no tenderness.  Musculoskeletal: Normal range of motion.  Neurological: He is alert and oriented to person, place, and time.  Skin: Skin is warm, dry and intact. No rash noted. He is not diaphoretic. No erythema. No pallor.  Psychiatric: He has a normal mood and affect. His speech is normal and behavior is normal. Judgment and thought content normal. Cognition and memory are normal.  Nursing note and vitals reviewed.   Results for orders placed or performed in visit on 02/04/17  Bayer DCA Hb A1c Waived  Result Value Ref Range   Bayer DCA Hb A1c Waived 9.0 (H) <7.0 %  Comprehensive metabolic panel  Result Value Ref Range   Glucose 236 (H) 65 - 99 mg/dL   BUN 18 6 - 24 mg/dL   Creatinine, Ser 6.57 0.76 - 1.27 mg/dL   GFR calc non Af Amer 76 >59 mL/min/1.73   GFR calc Af Amer 88 >59 mL/min/1.73   BUN/Creatinine Ratio 17 9 - 20   Sodium 139 134 - 144 mmol/L   Potassium 4.2 3.5 - 5.2 mmol/L  Chloride 98 96 - 106 mmol/L   CO2 20 18 - 29 mmol/L   Calcium 9.2 8.7 - 10.2 mg/dL   Total Protein 7.0 6.0 - 8.5 g/dL   Albumin 4.3 3.5 - 5.5 g/dL   Globulin, Total 2.7 1.5 - 4.5 g/dL   Albumin/Globulin Ratio 1.6 1.2 - 2.2   Bilirubin Total 0.3 0.0 - 1.2 mg/dL   Alkaline Phosphatase 81 39 - 117 IU/L   AST 14 0 - 40 IU/L   ALT 28 0 - 44 IU/L  Lipid Panel w/o Chol/HDL Ratio  Result Value Ref Range   Cholesterol, Total 214 (H) 100 - 199 mg/dL   Triglycerides 960391 (H) 0 - 149 mg/dL   HDL 35 (L) >45>39 mg/dL   VLDL Cholesterol  Cal 78 (H) 5 - 40 mg/dL   LDL Calculated 409101 (H) 0 - 99 mg/dL  Microalbumin, Urine Waived  Result Value Ref Range   Microalb, Ur Waived 150 (H) 0 - 19 mg/L   Creatinine, Urine Waived 200 10 - 300 mg/dL   Microalb/Creat Ratio 30-300 (H) <30 mg/g      Assessment & Plan:   Problem List Items Addressed This Visit      Cardiovascular and Mediastinum   Hypertensive heart failure (HCC)    Better on recheck. Continue current regimen. Continue to monitor.       Relevant Medications   metoprolol tartrate (LOPRESSOR) 25 MG tablet   lovastatin (MEVACOR) 20 MG tablet   lisinopril (PRINIVIL,ZESTRIL) 5 MG tablet     Endocrine   Uncontrolled diabetes mellitus (HCC) - Primary    A1c came back at 7.2 today- significantly better. Continue current regimen. Continue diet and exercise. Recheck 3 months.       Relevant Medications   metFORMIN (GLUCOPHAGE) 500 MG tablet   lovastatin (MEVACOR) 20 MG tablet   lisinopril (PRINIVIL,ZESTRIL) 5 MG tablet   Other Relevant Orders   Bayer DCA Hb A1c Waived     Other   HLD (hyperlipidemia)    Refill given today. Due for recheck next visit.       Relevant Medications   metoprolol tartrate (LOPRESSOR) 25 MG tablet   lovastatin (MEVACOR) 20 MG tablet   lisinopril (PRINIVIL,ZESTRIL) 5 MG tablet   Insomnia    Will start trazodone PRN. Call with any concerns.        Other Visit Diagnoses    Screening for colon cancer       Will do cologuard- ordered today.   Relevant Orders   Cologuard       Follow up plan: Return in about 3 months (around 08/06/2017) for DIabetes/Cholesterol/BP follow up.

## 2017-05-07 LAB — BAYER DCA HB A1C WAIVED: HB A1C: 7.2 % — AB (ref <7.0)

## 2017-07-21 ENCOUNTER — Ambulatory Visit (INDEPENDENT_AMBULATORY_CARE_PROVIDER_SITE_OTHER): Payer: Medicare Other

## 2017-07-21 VITALS — BP 161/76 | HR 67 | Temp 98.1°F | Resp 15 | Ht 70.0 in | Wt 194.7 lb

## 2017-07-21 DIAGNOSIS — Z0000 Encounter for general adult medical examination without abnormal findings: Secondary | ICD-10-CM | POA: Diagnosis not present

## 2017-07-21 NOTE — Progress Notes (Signed)
Subjective:   Lucas Weiss is a 61 y.o. male who presents for Medicare Annual/Subsequent preventive examination.  Review of Systems:  Cardiac Risk Factors include: male gender;advanced age (>8355men, 23>65 women);diabetes mellitus;hypertension;dyslipidemia     Objective:    Vitals: BP (!) 161/76 (BP Location: Left Arm, Patient Position: Sitting)   Pulse 67   Temp 98.1 F (36.7 C)   Resp 15   Ht 5\' 10"  (1.778 m)   Wt 194 lb 11.2 oz (88.3 kg)   BMI 27.94 kg/m   Body mass index is 27.94 kg/m.  Tobacco History  Smoking Status  . Former Smoker  . Packs/day: 1.50  . Years: 30.00  . Types: Cigarettes  . Quit date: 07/19/2008  Smokeless Tobacco  . Never Used     Counseling given: Not Answered   Past Medical History:  Diagnosis Date  . Alcohol abuse, in remission   . Avascular necrosis of bone of left hip (HCC) 10/15   Total hip replacement recommended by ortho, vascular says it's OK  . Bleeding hemorrhoids   . CHF (congestive heart failure) (HCC)   . Chronic low back pain   . Complication of anesthesia    "woke up twice during procedures @ [facility]" (09/15/2012)  . Coronary artery disease   . Diabetes mellitus without complication (HCC) 01/09/16   A1c 6.8  . Diverticulosis of sigmoid colon   . DVT (deep venous thrombosis) (HCC)   . Dyslexia   . H/O hiatal hernia   . Head injury, acute, with loss of consciousness (HCC)    as a child  . High cholesterol   . Hypertension   . Impaired fasting glucose    Last A1c 6.1  . Myocardial infarction (HCC)   . Neuropathy   . PAD (peripheral artery disease) (HCC)    07/2012:  right ABI 0.64, left ABI 0.57.  Marland Kitchen. Pneumonia    history of   . Primary osteoarthritis of left hip   . Renal artery stenosis (HCC)    Bilateral   . Severe recurrent major depression without psychotic features (HCC)   . Shortness of breath dyspnea    with exertion - due to pain  . Stroke [facility](HCC) 2009   "after CABG; ~ blind left eye since" (09/15/2012)-  memory loss   . Tobacco abuse    Quit in 2009   Past Surgical History:  Procedure Laterality Date  . 2D Echo  02/17/11   EF 50-55%, mild LVH, No WMA, mild MR, mild TR  . CARDIAC CATHETERIZATION  ?2009  . CARDIOVASCULAR STRESS TEST  08/10/12   Lexiscan: EF 70%, Attenuation artifact in inferior region.  No ischemia or infarct in remaining myocardium. Low risk  . COLONOSCOPY W/ POLYPECTOMY     "no cancer"  . CORONARY ARTERY BYPASS GRAFT  2009   2009, CABG X5, LIMA to LAD, SVG to Diagnonal, Sequential SVG to OM 2-3, SVG to PDA; Dr. Laneta SimmersBartle  . INSERTION OF ILIAC STENT Left 09/15/2012   Procedure: INSERTION OF ILIAC STENT;  Surgeon: Runell GessJonathan J Berry, MD;  [facility];  Service: Cardiovascular;  Laterality: Left;  lt iliac stent  . LOWER EXTREMITY ANGIOGRAM N/A 09/15/2012   Procedure: LOWER EXTREMITY ANGIOGRAM;  Surgeon: Runell GessJonathan J Berry, MD;  [facility];  Service: Cardiovascular;  Laterality: N/A;  . PERIPHERAL ARTERIAL STENT GRAFT Left 06/20/2012; ~ 07/2012; 09/15/2012   left; left; left  . PV angiogram  09/15/12   restenting of left common iliac for  ISRS.  Bilateral occluded SFAs.  Renal artery stenosis: 70% right inferior renal artery, 90% left inferior renal artery stenosis.   Marland Kitchen. TOTAL HIP ARTHROPLASTY Left 05/19/2016   Procedure: LEFT TOTAL HIP ARTHROPLASTY ANTERIOR APPROACH;  Surgeon: Kathryne Hitchhristopher Y Blackman, MD;  Location: [facility] OR;  Service: Orthopedics;  Laterality: Left;   Family History  Problem Relation Age of Onset  . Diabetes Mother   . Heart disease Mother   . Hyperlipidemia Mother   . Hypertension Mother   . Heart attack Mother   . Thyroid disease Mother   . Hyperlipidemia Father   . Hypertension Father   . Heart attack Father   . Heart disease Father        before age 59  . Alcohol abuse Father   . Lung disease Father   . Diabetes Sister   . Hyperlipidemia Sister   . Hypertension Sister   . Cancer Sister   . Heart disease Brother        before age 59  .  Hyperlipidemia Brother   . Hypertension Brother   . Heart attack Brother   . Diabetes Brother   . Heart disease Brother   . Hyperlipidemia Brother   . Hypertension Brother   . Stroke Brother   . Heart attack Brother   . Heart disease Sister   . Hypertension Sister   . Hyperlipidemia Sister    History  Sexual Activity  . Sexual activity: Not Currently    Outpatient Encounter Prescriptions as of 07/21/2017  Medication Sig  . lisinopril (PRINIVIL,ZESTRIL) 5 MG tablet Take 1 tablet (5 mg total) by mouth daily.  Marland Kitchen. lovastatin (MEVACOR) 20 MG tablet Take 2 tablets (40 mg total) by mouth at bedtime.  . metFORMIN (GLUCOPHAGE) 500 MG tablet Take 2 tablets (1,000 mg total) by mouth 2 (two) times daily with a meal.  . metoprolol tartrate (LOPRESSOR) 25 MG tablet Take 1 tablet by mouth every morning and 1/2 tablets at night (Patient taking differently: 25 mg 2 (two) times daily. Take 1 tablet by mouth every morning and 1/2 tablets at night)  . naproxen (NAPROSYN) 500 MG tablet Take 1 tablet (500 mg total) by mouth 2 (two) times daily with a meal.  . nitroGLYCERIN (NITROSTAT) 0.4 MG SL tablet Place 1 tablet (0.4 mg total) under the tongue every 5 (five) minutes as needed for chest pain. (Patient not taking: Reported on 07/21/2017)  . traZODone (DESYREL) 50 MG tablet Take 0.5-1 tablets (25-50 mg total) by mouth at bedtime as needed for sleep. (Patient not taking: Reported on 07/21/2017)   No facility-administered encounter medications on file as of 07/21/2017.     Activities of Daily Living In your present state of health, do you have any difficulty performing the following activities: 07/21/2017  Hearing? N  Vision? Y  Comment blind in left eye   Difficulty concentrating or making decisions? N  Walking or climbing stairs? N  Dressing or bathing? N  Doing errands, shopping? N  Preparing Food and eating ? N  Using the Toilet? N  In the past six months, have you accidently leaked urine? N  Do you  have problems with loss of bowel control? N  Managing your Medications? N  Managing your Finances? N  Housekeeping or managing your Housekeeping? N  Some recent data might be hidden    Patient Care Team: Dorcas CarrowJohnson, Megan P, DO as PCP - General (Family Medicine) Kathryne HitchBlackman, Christopher Y, MD as Consulting Physician (Orthopedic Surgery)   Assessment:  Exercise Activities and Dietary recommendations Current Exercise Habits: The patient does not participate in regular exercise at present, Exercise limited by: orthopedic condition(s) (back )  Goals    . Increase water intake          Recommend drinking at least 4-5 glasses of water a day       Fall Risk Fall Risk  07/21/2017 05/06/2017 01/09/2016  Falls in the past year? No No Yes  Number falls in past yr: - - 1  Injury with Fall? - - No  Risk for fall due to : - - Impaired mobility   Depression Screen PHQ 2/9 Scores 07/21/2017 11/16/2016 10/12/2016 09/14/2016  PHQ - 2 Score 0 6 6 6   PHQ- 9 Score - 21 23 19     Cognitive Function     6CIT Screen 07/21/2017  What Year? 0 points  What month? 0 points  What time? 0 points  Count back from 20 0 points  Months in reverse 0 points  Repeat phrase 2 points  Total Score 2    Immunization History  Administered Date(s) Administered  . Influenza Split 09/16/2012  . Influenza,inj,Quad PF,36+ Mos 09/06/2015, 09/14/2016  . Influenza-Unspecified 09/12/2014  . Pneumococcal Polysaccharide-23 05/28/2010, 05/20/2016  . Tdap 05/28/2010   Screening Tests Health Maintenance  Topic Date Due  . INFLUENZA VACCINE  07/21/2017  . OPHTHALMOLOGY EXAM  02/04/2018 (Originally 03/05/1966)  . COLON CANCER SCREENING ANNUAL FOBT  02/05/2018 (Originally 02/05/2017)  . COLONOSCOPY  02/05/2026 (Originally 02/05/2017)  . HEMOGLOBIN A1C  11/06/2017  . FOOT EXAM  02/04/2018  . TETANUS/TDAP  05/28/2020  . PNEUMOCOCCAL POLYSACCHARIDE VACCINE  Completed  . Hepatitis C Screening  Completed  . HIV Screening   Completed      Plan:    I have personally reviewed and addressed the Medicare Annual Wellness questionnaire and have noted the following in the patient's chart:  A. Medical and social history B. Use of alcohol, tobacco or illicit drugs  C. Current medications and supplements D. Functional ability and status E.  Nutritional status F.  Physical activity G. Advance directives H. List of other physicians I.  Hospitalizations, surgeries, and ER visits in previous 12 months J.  Vitals K. Screenings such as hearing and vision if needed, cognitive and depression L. Referrals and appointments  In addition, I have reviewed and discussed with patient certain preventive protocols, quality metrics, and best practice recommendations. A written personalized care plan for preventive services as well as general preventive health recommendations were provided to patient.   Signed,  Marin Roberts, LPN Nurse Health Advisor   MD Recommendations: none

## 2017-07-21 NOTE — Patient Instructions (Addendum)
Lucas Weiss , Thank you for taking time to come for your Medicare Wellness Visit. I appreciate your ongoing commitment to your health goals. Please review the following plan we discussed and let me know if I can assist you in the future.   Screening recommendations/referrals: Colonoscopy: Completed 02/06/2016 Recommended yearly ophthalmology/optometry visit for glaucoma screening and checkup Recommended yearly dental visit for hygiene and checkup  Vaccinations: Influenza vaccine: up to date Pneumococcal vaccine: due at age 59 Tdap vaccine: up to date Shingles vaccine: due at age 59  Advanced directives: Advance directive discussed with you today. I have provided a copy for you to complete at home and have notarized. Once this is complete please bring a copy in to our office so we can scan it into your chart.  Conditions/risks identified:Recommend drinking at least 4-5 glasses of water a day   Next appointment:Follow up on 08/09/2017 at 2:30pm with Dr.Johnson. Follow up in one year for your annual wellness exam.   Preventive Care 40-64 Years, Male Preventive care refers to lifestyle choices and visits with your health care provider that can promote health and wellness. What does preventive care include?  A yearly physical exam. This is also called an annual well check.  Dental exams once or twice a year.  Routine eye exams. Ask your health care provider how often you should have your eyes checked.  Personal lifestyle choices, including:  Daily care of your teeth and gums.  Regular physical activity.  Eating a healthy diet.  Avoiding tobacco and drug use.  Limiting alcohol use.  Practicing safe sex.  Taking low-dose aspirin every day starting at age 59. What happens during an annual well check? The services and screenings done by your health care provider during your annual well check will depend on your age, overall health, lifestyle risk factors, and family history of  disease. Counseling  Your health care provider may ask you questions about your:  Alcohol use.  Tobacco use.  Drug use.  Emotional well-being.  Home and relationship well-being.  Sexual activity.  Eating habits.  Work and work Astronomerenvironment. Screening  You may have the following tests or measurements:  Height, weight, and BMI.  Blood pressure.  Lipid and cholesterol levels. These may be checked every 5 years, or more frequently if you are over 59 years old.  Skin check.  Lung cancer screening. You may have this screening every year starting at age 59 if you have a 30-pack-year history of smoking and currently smoke or have quit within the past 15 years.  Fecal occult blood test (FOBT) of the stool. You may have this test every year starting at age 59.  Flexible sigmoidoscopy or colonoscopy. You may have a sigmoidoscopy every 5 years or a colonoscopy every 10 years starting at age 450.  Prostate cancer screening. Recommendations will vary depending on your family history and other risks.  Hepatitis C blood test.  Hepatitis B blood test.  Sexually transmitted disease (STD) testing.  Diabetes screening. This is done by checking your blood sugar (glucose) after you have not eaten for a while (fasting). You may have this done every 1-3 years. Discuss your test results, treatment options, and if necessary, the need for more tests with your health care provider. Vaccines  Your health care provider may recommend certain vaccines, such as:  Influenza vaccine. This is recommended every year.  Tetanus, diphtheria, and acellular pertussis (Tdap, Td) vaccine. You may need a Td booster every 10 years.  Zoster vaccine.  You may need this after age 59.  Pneumococcal 13-valent conjugate (PCV13) vaccine. You may need this if you have certain conditions and have not been vaccinated.  Pneumococcal polysaccharide (PPSV23) vaccine. You may need one or two doses if you smoke cigarettes  or if you have certain conditions. Talk to your health care provider about which screenings and vaccines you need and how often you need them. This information is not intended to replace advice given to you by your health care provider. Make sure you discuss any questions you have with your health care provider. Document Released: 01/03/2016 Document Revised: 08/26/2016 Document Reviewed: 10/08/2015 Elsevier Interactive Patient Education  2017 [employer]  Fall Prevention in the Home Falls can cause injuries. They can happen to people of all ages. There are many things you can do to make your home safe and to help prevent falls. What can I do on the outside of my home?  Regularly fix the edges of walkways and driveways and fix any cracks.  Remove anything that might make you trip as you walk through a door, such as a raised step or threshold.  Trim any bushes or trees on the path to your home.  Use bright outdoor lighting.  Clear any walking paths of anything that might make someone trip, such as rocks or tools.  Regularly check to see if handrails are loose or broken. Make sure that both sides of any steps have handrails.  Any raised decks and porches should have guardrails on the edges.  Have any leaves, snow, or ice cleared regularly.  Use sand or salt on walking paths during winter.  Clean up any spills in your garage right away. This includes oil or grease spills. What can I do in the bathroom?  Use night lights.  Install grab bars by the toilet and in the tub and shower. Do not use towel bars as grab bars.  Use non-skid mats or decals in the tub or shower.  If you need to sit down in the shower, use a plastic, non-slip stool.  Keep the floor dry. Clean up any water that spills on the floor as soon as it happens.  Remove soap buildup in the tub or shower regularly.  Attach bath mats securely with double-sided non-slip rug tape.  Do not have throw rugs and other  things on the floor that can make you trip. What can I do in the bedroom?  Use night lights.  Make sure that you have a light by your bed that is easy to reach.  Do not use any sheets or blankets that are too big for your bed. They should not hang down onto the floor.  Have a firm chair that has side arms. You can use this for support while you get dressed.  Do not have throw rugs and other things on the floor that can make you trip. What can I do in the kitchen?  Clean up any spills right away.  Avoid walking on wet floors.  Keep items that you use a lot in easy-to-reach places.  If you need to reach something above you, use a strong step stool that has a grab bar.  Keep electrical cords out of the way.  Do not use floor polish or wax that makes floors slippery. If you must use wax, use non-skid floor wax.  Do not have throw rugs and other things on the floor that can make you trip. What can I do with my stairs?  Do not leave any items on the stairs.  Make sure that there are handrails on both sides of the stairs and use them. Fix handrails that are broken or loose. Make sure that handrails are as long as the stairways.  Check any carpeting to make sure that it is firmly attached to the stairs. Fix any carpet that is loose or worn.  Avoid having throw rugs at the top or bottom of the stairs. If you do have throw rugs, attach them to the floor with carpet tape.  Make sure that you have a light switch at the top of the stairs and the bottom of the stairs. If you do not have them, ask someone to add them for you. What else can I do to help prevent falls?  Wear shoes that:  Do not have high heels.  Have rubber bottoms.  Are comfortable and fit you well.  Are closed at the toe. Do not wear sandals.  If you use a stepladder:  Make sure that it is fully opened. Do not climb a closed stepladder.  Make sure that both sides of the stepladder are locked into place.  Ask  someone to hold it for you, if possible.  Clearly mark and make sure that you can see:  Any grab bars or handrails.  First and last steps.  Where the edge of each step is.  Use tools that help you move around (mobility aids) if they are needed. These include:  Canes.  Walkers.  Scooters.  Crutches.  Turn on the lights when you go into a dark area. Replace any light bulbs as soon as they burn out.  Set up your furniture so you have a clear path. Avoid moving your furniture around.  If any of your floors are uneven, fix them.  If there are any pets around you, be aware of where they are.  Review your medicines with your doctor. Some medicines can make you feel dizzy. This can increase your chance of falling. Ask your doctor what other things that you can do to help prevent falls. This information is not intended to replace advice given to you by your health care provider. Make sure you discuss any questions you have with your health care provider. Document Released: 10/03/2009 Document Revised: 05/14/2016 Document Reviewed: 01/11/2015 Elsevier Interactive Patient Education  2017 [employer].

## 2017-08-09 ENCOUNTER — Ambulatory Visit (INDEPENDENT_AMBULATORY_CARE_PROVIDER_SITE_OTHER): Payer: Medicare Other | Admitting: Family Medicine

## 2017-08-09 ENCOUNTER — Encounter: Payer: Self-pay | Admitting: Family Medicine

## 2017-08-09 VITALS — BP 147/78 | HR 66 | Temp 98.8°F | Wt 196.2 lb

## 2017-08-09 DIAGNOSIS — E1165 Type 2 diabetes mellitus with hyperglycemia: Secondary | ICD-10-CM

## 2017-08-09 DIAGNOSIS — I25119 Atherosclerotic heart disease of native coronary artery with unspecified angina pectoris: Secondary | ICD-10-CM

## 2017-08-09 DIAGNOSIS — I209 Angina pectoris, unspecified: Secondary | ICD-10-CM | POA: Diagnosis not present

## 2017-08-09 DIAGNOSIS — E782 Mixed hyperlipidemia: Secondary | ICD-10-CM

## 2017-08-09 DIAGNOSIS — I110 Hypertensive heart disease with heart failure: Secondary | ICD-10-CM | POA: Diagnosis not present

## 2017-08-09 DIAGNOSIS — I701 Atherosclerosis of renal artery: Secondary | ICD-10-CM | POA: Diagnosis not present

## 2017-08-09 DIAGNOSIS — F332 Major depressive disorder, recurrent severe without psychotic features: Secondary | ICD-10-CM

## 2017-08-09 DIAGNOSIS — R3911 Hesitancy of micturition: Secondary | ICD-10-CM

## 2017-08-09 LAB — UA/M W/RFLX CULTURE, ROUTINE
BILIRUBIN UA: NEGATIVE
KETONES UA: NEGATIVE
LEUKOCYTES UA: NEGATIVE
Nitrite, UA: NEGATIVE
RBC UA: NEGATIVE
SPEC GRAV UA: 1.025 (ref 1.005–1.030)
Urobilinogen, Ur: 1 mg/dL (ref 0.2–1.0)
pH, UA: 5.5 (ref 5.0–7.5)

## 2017-08-09 LAB — MICROSCOPIC EXAMINATION
BACTERIA UA: NONE SEEN
RBC, UA: NONE SEEN /hpf (ref 0–?)

## 2017-08-09 LAB — MICROALBUMIN, URINE WAIVED
Creatinine, Urine Waived: 300 mg/dL (ref 10–300)
MICROALB, UR WAIVED: 150 mg/L — AB (ref 0–19)

## 2017-08-09 LAB — BAYER DCA HB A1C WAIVED: HB A1C (BAYER DCA - WAIVED): 7.2 % — ABNORMAL HIGH (ref <7.0)

## 2017-08-09 MED ORDER — GLIPIZIDE 5 MG PO TABS: 5.0000 mg | ORAL_TABLET | Freq: Two times a day (BID) | ORAL | 1 refills | Status: DC

## 2017-08-09 MED ORDER — LOVASTATIN 20 MG PO TABS: 40.0000 mg | ORAL_TABLET | Freq: Every day | ORAL | 1 refills | Status: DC

## 2017-08-09 MED ORDER — LISINOPRIL 10 MG PO TABS: 10.0000 mg | ORAL_TABLET | Freq: Every day | ORAL | 1 refills | Status: DC

## 2017-08-09 MED ORDER — METFORMIN HCL 500 MG PO TABS: 1000.0000 mg | ORAL_TABLET | Freq: Two times a day (BID) | ORAL | 1 refills | Status: DC

## 2017-08-09 MED ORDER — METOPROLOL TARTRATE 25 MG PO TABS: 25.0000 mg | ORAL_TABLET | Freq: Two times a day (BID) | ORAL | 1 refills | Status: DC

## 2017-08-09 NOTE — Progress Notes (Signed)
BP (!) 147/78 (BP Location: Right Arm, Patient Position: Sitting, Cuff Size: Large)   Pulse 66   Temp 98.8 F (37.1 C)   Wt 196 lb 4 oz (89 kg)   SpO2 98%   BMI 28.16 kg/m    Subjective:    Patient ID: Lucas Weiss, male    DOB: 24-Jul-1956, 61 y.o.   MRN: [id number]  HPI: Lucas Weiss is a 61 y.o. male  Chief Complaint  Patient presents with  . Diabetes  . Hypertension  . Hyperlipidemia  . Depression   HYPERTENSION / HYPERLIPIDEMIA Satisfied with current treatment? yes Duration of hypertension: chronic BP monitoring frequency: not checking BP medication side effects: no Duration of hyperlipidemia: chronic Cholesterol medication side effects: no Cholesterol supplements: none Medication compliance: good compliance Aspirin: no Recent stressors: yes Recurrent headaches: no Visual changes: no Palpitations: no Dyspnea: no Chest pain: no Lower extremity edema: no Dizzy/lightheaded: no  DIABETES- has only been taking 1 of his metformin BID Hypoglycemic episodes:no Polydipsia/polyuria: no Visual disturbance: no Chest pain: no Paresthesias: no Glucose Monitoring: no Taking Insulin?: no Blood Pressure Monitoring: not checking Retinal Examination: Up to Date Foot Exam: Up to Date Diabetic Education: Completed Pneumovax: Up to Date Influenza: Up to Date Aspirin: no  DEPRESSION- had his medical record discussed in court. Was very upset about that. Does not want to discuss his mood anymore.  Mood status: uncontrolled Satisfied with current treatment?: yes Symptom severity: mild   Depression screen [facility] 2/9 07/21/2017 11/16/2016 10/12/2016 09/14/2016 04/07/2016  Decreased Interest 0 3 3 3 3   Down, Depressed, Hopeless 0 3 3 3 2   PHQ - 2 Score 0 6 6 6 5   Altered sleeping - 3 3 3 3   Tired, decreased energy - 3 3 3 3   Change in appetite - - 3 0 1  Feeling bad or failure about yourself  - 3 3 2 3   Trouble concentrating - 3 3 3 3   Moving slowly or fidgety/restless - 3  2 2 2   Suicidal thoughts - 0 0 0 0  PHQ-9 Score - 21 23 19 20   Difficult doing work/chores - - - - -   CORONARY ARTERY DISEASE Angina frequency: never Chest pain: no Edema: no Orthopnea: no Paroxysmal nocturnal dyspnea: no Dyspnea on exertion: no Pneumovax:  Up to Date Aspirin: no  Relevant past medical, surgical, family and social history reviewed and updated as indicated. Interim medical history since our last visit reviewed. Allergies and medications reviewed and updated.  Review of Systems  Constitutional: Negative.   Respiratory: Negative.   Cardiovascular: Negative.   Psychiatric/Behavioral: Negative.     Per HPI unless specifically indicated above     Objective:    BP (!) 147/78 (BP Location: Right Arm, Patient Position: Sitting, Cuff Size: Large)   Pulse 66   Temp 98.8 F (37.1 C)   Wt 196 lb 4 oz (89 kg)   SpO2 98%   BMI 28.16 kg/m   Wt Readings from Last 3 Encounters:  08/09/17 196 lb 4 oz (89 kg)  07/21/17 194 lb 11.2 oz (88.3 kg)  05/06/17 196 lb (88.9 kg)    Physical Exam  Constitutional: He is oriented to person, place, and time. He appears well-developed and well-nourished. No distress.  HENT:  Head: Normocephalic and atraumatic.  Right Ear: Hearing normal.  Left Ear: Hearing normal.  Nose: Nose normal.  Eyes: Conjunctivae and lids are normal. Right eye exhibits no discharge. Left eye exhibits no discharge. No  scleral icterus.  Cardiovascular: Normal rate, regular rhythm, normal heart sounds and intact distal pulses.  Exam reveals no gallop and no friction rub.   No murmur heard. Pulmonary/Chest: Effort normal and breath sounds normal. No respiratory distress. He has no wheezes. He has no rales. He exhibits no tenderness.  Musculoskeletal: Normal range of motion.  Neurological: He is alert and oriented to person, place, and time.  Skin: Skin is warm, dry and intact. No rash noted. He is not diaphoretic.  Psychiatric: He has a normal mood and  affect. His speech is normal and behavior is normal. Judgment and thought content normal. Cognition and memory are normal.  Nursing note and vitals reviewed.   Results for orders placed or performed in visit on 05/06/17  Bayer DCA Hb A1c Waived  Result Value Ref Range   Bayer DCA Hb A1c Waived 7.2 (H) <7.0 %      Assessment & Plan:   Problem List Items Addressed This Visit      Cardiovascular and Mediastinum   CAD (coronary artery disease)    Stable. No pain. Continue BP and cholesterol and sugar control. Call with any concerns.       Relevant Medications   lisinopril (PRINIVIL,ZESTRIL) 10 MG tablet   metoprolol tartrate (LOPRESSOR) 25 MG tablet   lovastatin (MEVACOR) 20 MG tablet   Other Relevant Orders   CBC with Differential/Platelet   Comprehensive metabolic panel   UA/M w/rflx Culture, Routine   Hypertensive heart failure (HCC)    BP not under good control. Worse on repeat. Will increase to 10mg  of lisinopril and recheck in 1 month.       Relevant Medications   lisinopril (PRINIVIL,ZESTRIL) 10 MG tablet   metoprolol tartrate (LOPRESSOR) 25 MG tablet   lovastatin (MEVACOR) 20 MG tablet   Other Relevant Orders   CBC with Differential/Platelet   Comprehensive metabolic panel   Microalbumin, Urine Waived   TSH   UA/M w/rflx Culture, Routine   Renal artery stenosis, native, bilateral (HCC)    BP not under good control. Worse on repeat. Will increase to 10mg  of lisinopril and recheck in 1 month.       Relevant Medications   lisinopril (PRINIVIL,ZESTRIL) 10 MG tablet   metoprolol tartrate (LOPRESSOR) 25 MG tablet   lovastatin (MEVACOR) 20 MG tablet   Other Relevant Orders   CBC with Differential/Platelet   Comprehensive metabolic panel   Microalbumin, Urine Waived   UA/M w/rflx Culture, Routine     Endocrine   Uncontrolled diabetes mellitus (HCC) - Primary    No change in A1c, has only been taking 1 of his metfomin BID. Will add glipizide and recheck in 1 month  for tolerance.       Relevant Medications   lisinopril (PRINIVIL,ZESTRIL) 10 MG tablet   lovastatin (MEVACOR) 20 MG tablet   metFORMIN (GLUCOPHAGE) 500 MG tablet   glipiZIDE (GLUCOTROL) 5 MG tablet   Other Relevant Orders   Bayer DCA Hb A1c Waived   CBC with Differential/Platelet   Comprehensive metabolic panel   Microalbumin, Urine Waived   TSH   UA/M w/rflx Culture, Routine     Other   HLD (hyperlipidemia)    Rechecking levels today. Await results. Call with any concerns.       Relevant Medications   lisinopril (PRINIVIL,ZESTRIL) 10 MG tablet   metoprolol tartrate (LOPRESSOR) 25 MG tablet   lovastatin (MEVACOR) 20 MG tablet   Other Relevant Orders   CBC with Differential/Platelet   Comprehensive metabolic  panel   Lipid Panel w/o Chol/HDL Ratio   UA/M w/rflx Culture, Routine   Severe recurrent major depression (HCC)    Does not want to talk about this any more. Does not want to do a PHQ9. Does not want anything in his chart. Advised him that if he starts feeling worse or changes his mind, he should let us know.       Relevant Orders   CBC with Differential/Platelet   Comprehensive metabolic panel   TSH   UA/M w/rflx Culture, Routine    Other Visit Diagnoses    Urinary hesitancy       PSA checked today. Await results.    Relevant Orders   PSA       Follow up plan: Return in about 4 weeks (around 09/06/2017) for follow up BP and glipizide tolerance.

## 2017-08-09 NOTE — Assessment & Plan Note (Signed)
BP not under good control. Worse on repeat. Will increase to 10mg of lisinopril and recheck in 1 month.  

## 2017-08-09 NOTE — Assessment & Plan Note (Signed)
Stable. No pain. Continue BP and cholesterol and sugar control. Call with any concerns.

## 2017-08-09 NOTE — Assessment & Plan Note (Signed)
No change in A1c, has only been taking 1 of his metfomin BID. Will add glipizide and recheck in 1 month for tolerance.

## 2017-08-09 NOTE — Assessment & Plan Note (Signed)
Does not want to talk about this any more. Does not want to do a PHQ9. Does not want anything in his chart. Advised him that if he starts feeling worse or changes his mind, he should let us know.

## 2017-08-09 NOTE — Assessment & Plan Note (Signed)
Rechecking levels today. Await results. Call with any concerns.  

## 2017-08-09 NOTE — Assessment & Plan Note (Signed)
BP not under good control. Worse on repeat. Will increase to 10mg  of lisinopril and recheck in 1 month.

## 2017-08-10 ENCOUNTER — Encounter: Payer: Self-pay | Admitting: Family Medicine

## 2017-08-10 LAB — COMPREHENSIVE METABOLIC PANEL
A/G RATIO: 1.8 (ref 1.2–2.2)
ALBUMIN: 4.1 g/dL (ref 3.5–5.5)
ALK PHOS: 69 IU/L (ref 39–117)
ALT: 26 IU/L (ref 0–44)
AST: 16 IU/L (ref 0–40)
BUN / CREAT RATIO: 13 (ref 9–20)
BUN: 14 mg/dL (ref 6–24)
Bilirubin Total: <0.2 mg/dL (ref 0.0–1.2)
CO2: 19 mmol/L — ABNORMAL LOW (ref 20–29)
Calcium: 8.6 mg/dL — ABNORMAL LOW (ref 8.7–10.2)
Chloride: 105 mmol/L (ref 96–106)
Creatinine, Ser: 1.05 mg/dL (ref 0.76–1.27)
GFR calc non Af Amer: 77 mL/min/{1.73_m2} (ref >59)
GFR, EST AFRICAN AMERICAN: 89 mL/min/{1.73_m2} (ref >59)
GLUCOSE: 208 mg/dL — AB (ref 65–99)
Globulin, Total: 2.3 g/dL (ref 1.5–4.5)
POTASSIUM: 4.2 mmol/L (ref 3.5–5.2)
Sodium: 140 mmol/L (ref 134–144)
TOTAL PROTEIN: 6.4 g/dL (ref 6.0–8.5)

## 2017-08-10 LAB — TSH: TSH: 0.819 u[IU]/mL (ref 0.450–4.500)

## 2017-08-10 LAB — CBC WITH DIFFERENTIAL/PLATELET
BASOS ABS: 0.1 10*3/uL (ref 0.0–0.2)
BASOS: 1 %
EOS (ABSOLUTE): 0.2 10*3/uL (ref 0.0–0.4)
Eos: 3 %
Hematocrit: 39 % (ref 37.5–51.0)
Hemoglobin: 12.7 g/dL — ABNORMAL LOW (ref 13.0–17.7)
IMMATURE GRANS (ABS): 0 10*3/uL (ref 0.0–0.1)
Immature Granulocytes: 0 %
LYMPHS ABS: 2 10*3/uL (ref 0.7–3.1)
Lymphs: 31 %
MCH: 27.8 pg (ref 26.6–33.0)
MCHC: 32.6 g/dL (ref 31.5–35.7)
MCV: 85 fL (ref 79–97)
Monocytes Absolute: 0.5 10*3/uL (ref 0.1–0.9)
Monocytes: 8 %
Neutrophils Absolute: 3.6 10*3/uL (ref 1.4–7.0)
Neutrophils: 57 %
PLATELETS: 198 10*3/uL (ref 150–379)
RBC: 4.57 x10E6/uL (ref 4.14–5.80)
RDW: 15.4 % (ref 12.3–15.4)
WBC: 6.4 10*3/uL (ref 3.4–10.8)

## 2017-08-10 LAB — LIPID PANEL W/O CHOL/HDL RATIO
CHOLESTEROL TOTAL: 177 mg/dL (ref 100–199)
HDL: 33 mg/dL — ABNORMAL LOW (ref >39)
LDL Calculated: 70 mg/dL (ref 0–99)
Triglycerides: 371 mg/dL — ABNORMAL HIGH (ref 0–149)
VLDL Cholesterol Cal: 74 mg/dL — ABNORMAL HIGH (ref 5–40)

## 2017-08-10 LAB — PSA: Prostate Specific Ag, Serum: 0.8 ng/mL (ref 0.0–4.0)

## 2017-09-09 ENCOUNTER — Ambulatory Visit (INDEPENDENT_AMBULATORY_CARE_PROVIDER_SITE_OTHER): Payer: Medicare Other | Admitting: Family Medicine

## 2017-09-09 ENCOUNTER — Encounter: Payer: Self-pay | Admitting: Family Medicine

## 2017-09-09 VITALS — BP 120/72 | HR 77 | Resp 14 | Ht 70.0 in | Wt 191.8 lb

## 2017-09-09 DIAGNOSIS — I110 Hypertensive heart disease with heart failure: Secondary | ICD-10-CM

## 2017-09-09 DIAGNOSIS — E1165 Type 2 diabetes mellitus with hyperglycemia: Secondary | ICD-10-CM | POA: Diagnosis not present

## 2017-09-09 DIAGNOSIS — Z23 Encounter for immunization: Secondary | ICD-10-CM | POA: Diagnosis not present

## 2017-09-09 DIAGNOSIS — I701 Atherosclerosis of renal artery: Secondary | ICD-10-CM | POA: Diagnosis not present

## 2017-09-09 DIAGNOSIS — G894 Chronic pain syndrome: Secondary | ICD-10-CM

## 2017-09-09 NOTE — Progress Notes (Signed)
BP 120/72   Pulse 77   Resp 14   Ht  (1.778 m)   Wt 191 lb 12.8 oz (87 kg)   BMI 27.52 kg/m    Subjective:    Patient ID: Lucas Weiss, male    DOB: 1956-07-21, 61 y.o.   MRN: [id number]  HPI: Lucas Weiss is a 61 y.o. male  Chief Complaint  Patient presents with  . Hypertension   HYPERTENSION Hypertension status: controlled  Satisfied with current treatment? yes Duration of hypertension: chronic BP monitoring frequency:  not checking BP medication side effects:  no Medication compliance: excellent compliance Previous BP meds: lisinopril, metoprolol Aspirin: no Recurrent headaches: no Visual changes: no Palpitations: no Dyspnea: no Chest pain: no Lower extremity edema: no Dizzy/lightheaded: no   Back still bothering him a lot. Doesn't want to see pain medicine at this time. Better than he has been. Has been pretty stable.  Started on glipizide last visit. Feeling well on the glipizide. Not checking his sugars. Not getting dizzy. Otherwise feeling well with no complaints at this time.   Relevant past medical, surgical, family and social history reviewed and updated as indicated. Interim medical history since our last visit reviewed. Allergies and medications reviewed and updated.  Review of Systems  Respiratory: Negative.   Cardiovascular: Negative.   Musculoskeletal: Positive for back pain and myalgias. Negative for arthralgias, gait problem, joint swelling, neck pain and neck stiffness.  Neurological: Positive for weakness and numbness. Negative for dizziness, tremors, seizures, syncope, facial asymmetry, speech difficulty, light-headedness and headaches.  Psychiatric/Behavioral: Negative.     Per HPI unless specifically indicated above     Objective:    BP 120/72   Pulse 77   Resp 14   Ht  (1.778 m)   Wt 191 lb 12.8 oz (87 kg)   BMI 27.52 kg/m   Wt Readings from Last 3 Encounters:  09/09/17 191 lb 12.8 oz (87 kg)  08/09/17 196 lb 4 oz  (89 kg)  07/21/17 194 lb 11.2 oz (88.3 kg)    Physical Exam  Constitutional: He is oriented to person, place, and time. He appears well-developed and well-nourished. No distress.  HENT:  Head: Normocephalic and atraumatic.  Right Ear: Hearing normal.  Left Ear: Hearing normal.  Nose: Nose normal.  Eyes: Conjunctivae and lids are normal. Right eye exhibits no discharge. Left eye exhibits no discharge. No scleral icterus.  Cardiovascular: Normal rate, regular rhythm, normal heart sounds and intact distal pulses.  Exam reveals no gallop and no friction rub.   No murmur heard. Pulmonary/Chest: Effort normal and breath sounds normal. No respiratory distress. He has no wheezes. He has no rales. He exhibits no tenderness.  Musculoskeletal: Normal range of motion.  Neurological: He is alert and oriented to person, place, and time.  Skin: Skin is warm, dry and intact. No rash noted. He is not diaphoretic. No erythema. No pallor.  Psychiatric: He has a normal mood and affect. His speech is normal and behavior is normal. Judgment and thought content normal. Cognition and memory are normal.  Nursing note and vitals reviewed.   Results for orders placed or performed in visit on 08/09/17  Microscopic Examination  Result Value Ref Range   WBC, UA 0-5 0 - 5 /hpf   RBC, UA None seen 0 - 2 /hpf   Epithelial Cells (non renal) CANCELED    Bacteria, UA None seen None seen/Few  Bayer DCA Hb A1c Waived  Result Value Ref  Range   Bayer DCA Hb A1c Waived 7.2 (H) <7.0 %  CBC with Differential/Platelet  Result Value Ref Range   WBC 6.4 3.4 - 10.8 x10E3/uL   RBC 4.57 4.14 - 5.80 x10E6/uL   Hemoglobin 12.7 (L) 13.0 - 17.7 g/dL   Hematocrit 16.1 09.6 - 51.0 %   MCV 85 79 - 97 fL   MCH 27.8 26.6 - 33.0 pg   MCHC 32.6 31.5 - 35.7 g/dL   RDW 04.5 40.9 - 81.1 %   Platelets 198 150 - 379 x10E3/uL   Neutrophils 57 Not Estab. %   Lymphs 31 Not Estab. %   Monocytes 8 Not Estab. %   Eos 3 Not Estab. %   Basos  1 Not Estab. %   Neutrophils Absolute 3.6 1.4 - 7.0 x10E3/uL   Lymphocytes Absolute 2.0 0.7 - 3.1 x10E3/uL   Monocytes Absolute 0.5 0.1 - 0.9 x10E3/uL   EOS (ABSOLUTE) 0.2 0.0 - 0.4 x10E3/uL   Basophils Absolute 0.1 0.0 - 0.2 x10E3/uL   Immature Granulocytes 0 Not Estab. %   Immature Grans (Abs) 0.0 0.0 - 0.1 x10E3/uL  Comprehensive metabolic panel  Result Value Ref Range   Glucose 208 (H) 65 - 99 mg/dL   BUN 14 6 - 24 mg/dL   Creatinine, Ser 9.14 0.76 - 1.27 mg/dL   GFR calc non Af Amer 77 >59 mL/min/1.73   GFR calc Af Amer 89 >59 mL/min/1.73   BUN/Creatinine Ratio 13 9 - 20   Sodium 140 134 - 144 mmol/L   Potassium 4.2 3.5 - 5.2 mmol/L   Chloride 105 96 - 106 mmol/L   CO2 19 (L) 20 - 29 mmol/L   Calcium 8.6 (L) 8.7 - 10.2 mg/dL   Total Protein 6.4 6.0 - 8.5 g/dL   Albumin 4.1 3.5 - 5.5 g/dL   Globulin, Total 2.3 1.5 - 4.5 g/dL   Albumin/Globulin Ratio 1.8 1.2 - 2.2   Bilirubin Total <0.2 0.0 - 1.2 mg/dL   Alkaline Phosphatase 69 39 - 117 IU/L   AST 16 0 - 40 IU/L   ALT 26 0 - 44 IU/L  Lipid Panel w/o Chol/HDL Ratio  Result Value Ref Range   Cholesterol, Total 177 100 - 199 mg/dL   Triglycerides 782 (H) 0 - 149 mg/dL   HDL 33 (L) >95 mg/dL   VLDL Cholesterol Cal 74 (H) 5 - 40 mg/dL   LDL Calculated 70 0 - 99 mg/dL  Microalbumin, Urine Waived  Result Value Ref Range   Microalb, Ur Waived 150 (H) 0 - 19 mg/L   Creatinine, Urine Waived 300 10 - 300 mg/dL   Microalb/Creat Ratio 30-300 (H) <30 mg/g  PSA  Result Value Ref Range   Prostate Specific Ag, Serum 0.8 0.0 - 4.0 ng/mL  TSH  Result Value Ref Range   TSH 0.819 0.450 - 4.500 uIU/mL  UA/M w/rflx Culture, Routine  Result Value Ref Range   Specific Gravity, UA 1.025 1.005 - 1.030   pH, UA 5.5 5.0 - 7.5   Color, UA Yellow Yellow   Appearance Ur Clear Clear   Leukocytes, UA Negative Negative   Protein, UA 2+ (A) Negative/Trace   Glucose, UA Trace (A) Negative   Ketones, UA Negative Negative   RBC, UA Negative  Negative   Bilirubin, UA Negative Negative   Urobilinogen, Ur 1.0 0.2 - 1.0 mg/dL   Nitrite, UA Negative Negative   Microscopic Examination See below:       Assessment & Plan:  Problem List Items Addressed This Visit      Cardiovascular and Mediastinum   Hypertensive heart failure (HCC) - Primary    BP under good control. Continue current regimen. Continue to monitor. Call with any concerns.       Relevant Orders   Basic metabolic panel     Endocrine   Uncontrolled diabetes mellitus (HCC)    Tolerating glipizide well. Continue current regimen. Continue to monitor. Call with any concerns.         Other   Chronic pain    Offered referral to pain management. He is not interested at this time. Does not want to pursue any injections. Continue to monitor. Call with any concerns.       Other Visit Diagnoses    Need for influenza vaccination       Flu shot given today.    Relevant Orders   Flu Vaccine QUAD 6+ mos PF IM (Fluarix Quad PF)       Follow up plan: Return in about 2 months (around 11/09/2017) for DM follow up.

## 2017-09-09 NOTE — Assessment & Plan Note (Signed)
Offered referral to pain management. He is not interested at this time. Does not want to pursue any injections. Continue to monitor. Call with any concerns.

## 2017-09-09 NOTE — Assessment & Plan Note (Signed)
Tolerating glipizide well. Continue current regimen. Continue to monitor. Call with any concerns.

## 2017-09-09 NOTE — Patient Instructions (Addendum)

## 2017-09-09 NOTE — Assessment & Plan Note (Signed)
BP under good control. Continue current regimen. Continue to monitor. Call with any concerns.  

## 2017-09-10 ENCOUNTER — Telehealth: Payer: Self-pay | Admitting: Family Medicine

## 2017-09-10 DIAGNOSIS — N179 Acute kidney failure, unspecified: Secondary | ICD-10-CM

## 2017-09-10 LAB — BASIC METABOLIC PANEL
BUN/Creatinine Ratio: 22 — ABNORMAL HIGH (ref 9–20)
BUN: 31 mg/dL — AB (ref 6–24)
CALCIUM: 9.7 mg/dL (ref 8.7–10.2)
CO2: 20 mmol/L (ref 20–29)
CREATININE: 1.43 mg/dL — AB (ref 0.76–1.27)
Chloride: 104 mmol/L (ref 96–106)
GFR calc Af Amer: 62 mL/min/{1.73_m2} (ref >59)
GFR, EST NON AFRICAN AMERICAN: 53 mL/min/{1.73_m2} — AB (ref >59)
Glucose: 131 mg/dL — ABNORMAL HIGH (ref 65–99)
Potassium: 4.1 mmol/L (ref 3.5–5.2)
SODIUM: 141 mmol/L (ref 134–144)

## 2017-09-10 NOTE — Telephone Encounter (Signed)
Called and informed patient of lab work, advised patient to drink a lot of water and return for repeat labs in 2 weeks (around October 5th). Patient verbalized understanding and denies any questions or concerns at this time.

## 2017-09-10 NOTE — Telephone Encounter (Signed)
Please let him know that his kidney function went up quite a bit. This may be just because he's dehydrated- which has been the case with him before. I'd like him to drink a lot of water and come back in for a blood test in 2 weeks. Thanks!

## 2017-10-27 ENCOUNTER — Telehealth: Payer: Self-pay | Admitting: Family Medicine

## 2017-10-27 NOTE — Telephone Encounter (Signed)
Copied from CRM [id number]. Topic: Quick Communication - See Telephone Encounter >> Oct 27, 2017 12:41 PM Rudi CocoLathan, Jabarri Stefanelli M, NT wrote: CRM for notification. See Telephone encounter for:   10/27/17. [facility] called to see if there are any X-rays films for the pt.(Teodor Newby) Please call her to let her know [phone]

## 2017-10-28 NOTE — Telephone Encounter (Signed)
Called and left a message to let them know that we would need a signed records release to release any medical records, also [facility] need to [facility].

## 2017-11-09 ENCOUNTER — Ambulatory Visit (INDEPENDENT_AMBULATORY_CARE_PROVIDER_SITE_OTHER): Payer: Medicare Other | Admitting: Family Medicine

## 2017-11-09 ENCOUNTER — Telehealth (INDEPENDENT_AMBULATORY_CARE_PROVIDER_SITE_OTHER): Payer: Self-pay | Admitting: Radiology

## 2017-11-09 ENCOUNTER — Encounter: Payer: Self-pay | Admitting: Family Medicine

## 2017-11-09 VITALS — BP 132/56 | HR 65 | Temp 97.9°F | Wt 197.2 lb

## 2017-11-09 DIAGNOSIS — E1165 Type 2 diabetes mellitus with hyperglycemia: Secondary | ICD-10-CM | POA: Diagnosis not present

## 2017-11-09 DIAGNOSIS — N179 Acute kidney failure, unspecified: Secondary | ICD-10-CM

## 2017-11-09 DIAGNOSIS — G4700 Insomnia, unspecified: Secondary | ICD-10-CM | POA: Diagnosis not present

## 2017-11-09 DIAGNOSIS — I701 Atherosclerosis of renal artery: Secondary | ICD-10-CM

## 2017-11-09 LAB — BAYER DCA HB A1C WAIVED: HB A1C (BAYER DCA - WAIVED): 6.5 % (ref <7.0)

## 2017-11-09 MED ORDER — TRAZODONE HCL 50 MG PO TABS: 50.0000 mg | ORAL_TABLET | Freq: Every evening | ORAL | 3 refills | Status: DC | PRN

## 2017-11-09 NOTE — Progress Notes (Signed)
BP (!) 132/56 (BP Location: Left Arm, Cuff Size: Normal)   Pulse 65   Temp 97.9 F (36.6 C)   Wt 197 lb 3 oz (89.4 kg)   SpO2 97%   BMI 28.29 kg/Weiss    Subjective:    Patient ID: Lucas Weiss Macioce, male    DOB: 03/06/1956, 61 y.o.   MRN: [id number]  HPI: Lucas Weiss Stencel is a 61 y.o. male  Chief Complaint  Patient presents with  . Diabetes   DIABETES- got really dizzy on the glipizide, so he stopped it.  Hypoglycemic episodes:no Polydipsia/polyuria: yes Visual disturbance: no Chest pain: no Paresthesias: no Glucose Monitoring: no  Accucheck frequency: Not Checking Taking Insulin?: no Blood Pressure Monitoring: not checking Retinal Examination: Up to Date Foot Exam: Up to Date Diabetic Education: Completed Pneumovax: Up to Date Influenza: Up to Date Aspirin: yes  Has been having a lot of trouble sleeping again. Thoughts run in his mind when he's laying down and his back hurts. Doesn't want to see pain management. Doesn't want to do PT. Doesn't want any treatment for his anxiety or depression.   Relevant past medical, surgical, family and social history reviewed and updated as indicated. Interim medical history since our last visit reviewed. Allergies and medications reviewed and updated.  Review of Systems  Constitutional: Negative.   Respiratory: Negative.   Cardiovascular: Negative.   Musculoskeletal: Positive for back pain.  Psychiatric/Behavioral: Positive for dysphoric mood and sleep disturbance. Negative for agitation, behavioral problems, confusion, decreased concentration, hallucinations, self-injury and suicidal ideas. The patient is nervous/anxious. The patient is not hyperactive.     Per HPI unless specifically indicated above     Objective:    BP (!) 132/56 (BP Location: Left Arm, Cuff Size: Normal)   Pulse 65   Temp 97.9 F (36.6 C)   Wt 197 lb 3 oz (89.4 kg)   SpO2 97%   BMI 28.29 kg/Weiss   Wt Readings from Last 3 Encounters:  11/09/17 197 lb 3 oz  (89.4 kg)  09/09/17 191 lb 12.8 oz (87 kg)  08/09/17 196 lb 4 oz (89 kg)    Physical Exam  Constitutional: He is oriented to person, place, and time. He appears well-developed and well-nourished. No distress.  HENT:  Head: Normocephalic and atraumatic.  Right Ear: Hearing normal.  Left Ear: Hearing normal.  Nose: Nose normal.  Eyes: Conjunctivae and lids are normal. Right eye exhibits no discharge. Left eye exhibits no discharge. No scleral icterus.  Cardiovascular: Normal rate, regular rhythm, normal heart sounds and intact distal pulses. Exam reveals no gallop and no friction rub.  No murmur heard. Pulmonary/Chest: Effort normal and breath sounds normal. No respiratory distress. He has no wheezes. He has no rales. He exhibits no tenderness.  Musculoskeletal: Normal range of motion.  Neurological: He is alert and oriented to person, place, and time.  Skin: Skin is warm, dry and intact. No rash noted. He is not diaphoretic. No erythema. No pallor.  Psychiatric: He has a normal mood and affect. His speech is normal and behavior is normal. Judgment and thought content normal. Cognition and memory are normal.  Nursing note and vitals reviewed.   Results for orders placed or performed in visit on 09/09/17  Basic metabolic panel  Result Value Ref Range   Glucose 131 (H) 65 - 99 mg/dL   BUN 31 (H) 6 - 24 mg/dL   Creatinine, Ser 5.781.43 (H) 0.76 - 1.27 mg/dL   GFR calc non Af [place]  53 (L) >59 mL/min/1.73   GFR calc Af Amer 62 >59 mL/min/1.73   BUN/Creatinine Ratio 22 (H) 9 - 20   Sodium 141 134 - 144 mmol/L   Potassium 4.1 3.5 - 5.2 mmol/L   Chloride 104 96 - 106 mmol/L   CO2 20 20 - 29 mmol/L   Calcium 9.7 8.7 - 10.2 mg/dL      Assessment & Plan:   Problem List Items Addressed This Visit      Endocrine   Controlled type 2 diabetes mellitus (HCC) - Primary    A1c down to 6.5. Stop glipizide for hypoglycemia. Continue metformin. Recheck 3 months.         Other   Insomnia     Refill of trazodone given. Call with any concerns or if not getting better or getting worse.        Other Visit Diagnoses    AKI (acute kidney injury) (HCC)       Rechecking levels today. Await results.    Relevant Orders   Basic metabolic panel       Follow up plan: Return in about 3 months (around 02/09/2018) for wellness, DM/chol/BP follow up.

## 2017-11-09 NOTE — Assessment & Plan Note (Signed)
Refill of trazodone given. Call with any concerns or if not getting better or getting worse.

## 2017-11-09 NOTE — Telephone Encounter (Signed)
Received release form from [facility] requesting copies of any imaging from 11/11/2015 up to present. CD will be placed in mail once made.

## 2017-11-09 NOTE — Assessment & Plan Note (Signed)
A1c down to 6.5. Stop glipizide for hypoglycemia. Continue metformin. Recheck 3 months.

## 2017-11-10 ENCOUNTER — Encounter: Payer: Self-pay | Admitting: Family Medicine

## 2017-11-10 LAB — BASIC METABOLIC PANEL
BUN / CREAT RATIO: 17 (ref 9–20)
BUN: 20 mg/dL (ref 6–24)
CHLORIDE: 104 mmol/L (ref 96–106)
CO2: 22 mmol/L (ref 20–29)
Calcium: 8.8 mg/dL (ref 8.7–10.2)
Creatinine, Ser: 1.19 mg/dL (ref 0.76–1.27)
GFR calc Af Amer: 77 mL/min/{1.73_m2} (ref >59)
GFR calc non Af Amer: 66 mL/min/{1.73_m2} (ref >59)
GLUCOSE: 107 mg/dL — AB (ref 65–99)
Potassium: 4.7 mmol/L (ref 3.5–5.2)
SODIUM: 140 mmol/L (ref 134–144)

## 2018-01-11 ENCOUNTER — Other Ambulatory Visit: Payer: Self-pay | Admitting: Family Medicine

## 2018-01-11 DIAGNOSIS — I110 Hypertensive heart disease with heart failure: Secondary | ICD-10-CM

## 2018-02-10 ENCOUNTER — Encounter: Payer: Self-pay | Admitting: Family Medicine

## 2018-02-10 ENCOUNTER — Ambulatory Visit (INDEPENDENT_AMBULATORY_CARE_PROVIDER_SITE_OTHER): Payer: Medicare Other | Admitting: Family Medicine

## 2018-02-10 VITALS — BP 125/76 | HR 82 | Temp 98.7°F | Wt 194.1 lb

## 2018-02-10 DIAGNOSIS — G4700 Insomnia, unspecified: Secondary | ICD-10-CM | POA: Diagnosis not present

## 2018-02-10 DIAGNOSIS — E782 Mixed hyperlipidemia: Secondary | ICD-10-CM

## 2018-02-10 DIAGNOSIS — I70219 Atherosclerosis of native arteries of extremities with intermittent claudication, unspecified extremity: Secondary | ICD-10-CM | POA: Diagnosis not present

## 2018-02-10 DIAGNOSIS — I701 Atherosclerosis of renal artery: Secondary | ICD-10-CM

## 2018-02-10 DIAGNOSIS — I739 Peripheral vascular disease, unspecified: Secondary | ICD-10-CM

## 2018-02-10 DIAGNOSIS — I25119 Atherosclerotic heart disease of native coronary artery with unspecified angina pectoris: Secondary | ICD-10-CM

## 2018-02-10 DIAGNOSIS — I110 Hypertensive heart disease with heart failure: Secondary | ICD-10-CM

## 2018-02-10 DIAGNOSIS — E1165 Type 2 diabetes mellitus with hyperglycemia: Secondary | ICD-10-CM | POA: Diagnosis not present

## 2018-02-10 DIAGNOSIS — G894 Chronic pain syndrome: Secondary | ICD-10-CM | POA: Diagnosis not present

## 2018-02-10 DIAGNOSIS — F332 Major depressive disorder, recurrent severe without psychotic features: Secondary | ICD-10-CM | POA: Diagnosis not present

## 2018-02-10 DIAGNOSIS — R3911 Hesitancy of micturition: Secondary | ICD-10-CM

## 2018-02-10 DIAGNOSIS — Z0000 Encounter for general adult medical examination without abnormal findings: Secondary | ICD-10-CM | POA: Insufficient documentation

## 2018-02-10 LAB — UA/M W/RFLX CULTURE, ROUTINE
BILIRUBIN UA: NEGATIVE
Glucose, UA: NEGATIVE
Leukocytes, UA: NEGATIVE
Nitrite, UA: NEGATIVE
PH UA: 5.5 (ref 5.0–7.5)
RBC UA: NEGATIVE
Specific Gravity, UA: >1.03 — ABNORMAL HIGH (ref 1.005–1.030)
UUROB: 1 mg/dL (ref 0.2–1.0)

## 2018-02-10 LAB — BAYER DCA HB A1C WAIVED: HB A1C: 6.5 % (ref <7.0)

## 2018-02-10 LAB — MICROALBUMIN, URINE WAIVED
Creatinine, Urine Waived: 300 mg/dL (ref 10–300)
Microalb, Ur Waived: 150 mg/L — ABNORMAL HIGH (ref 0–19)

## 2018-02-10 LAB — MICROSCOPIC EXAMINATION: Bacteria, UA: NONE SEEN

## 2018-02-10 MED ORDER — LOVASTATIN 20 MG PO TABS: 40.0000 mg | ORAL_TABLET | Freq: Every day | ORAL | 1 refills | Status: DC

## 2018-02-10 MED ORDER — QUETIAPINE FUMARATE 25 MG PO TABS: 25.0000 mg | ORAL_TABLET | Freq: Every day | ORAL | 3 refills | Status: DC

## 2018-02-10 MED ORDER — METOPROLOL TARTRATE 50 MG PO TABS: 50.0000 mg | ORAL_TABLET | Freq: Every day | ORAL | 1 refills | Status: DC

## 2018-02-10 MED ORDER — METFORMIN HCL 500 MG PO TABS: 1000.0000 mg | ORAL_TABLET | Freq: Two times a day (BID) | ORAL | 1 refills | Status: DC

## 2018-02-10 MED ORDER — NAPROXEN 500 MG PO TABS: 500.0000 mg | ORAL_TABLET | Freq: Two times a day (BID) | ORAL | 3 refills | Status: DC

## 2018-02-10 MED ORDER — LISINOPRIL 10 MG PO TABS: 10.0000 mg | ORAL_TABLET | Freq: Every day | ORAL | 0 refills | Status: DC

## 2018-02-10 NOTE — Assessment & Plan Note (Signed)
Stable with A1c of 6.5. Continue current regimen. Continue to monitor. Call with any concerns.

## 2018-02-10 NOTE — Assessment & Plan Note (Signed)
No chest pain. Stable. Keep BP/cholesterol/DM under control. Continue to monitor. Call with any concerns.  

## 2018-02-10 NOTE — Assessment & Plan Note (Signed)
Again offered referral to pain management. He declined. Will let us know if he changes his mind.

## 2018-02-10 NOTE — Progress Notes (Signed)
BP 125/76 (BP Location: Left Arm, Patient Position: Sitting, Cuff Size: Normal)   Pulse 82   Temp 98.7 F (37.1 C) (Oral)   Wt 194 lb 1 oz (88 kg)   SpO2 98%   BMI 27.85 kg/m    Subjective:    Patient ID: Lucas Weiss, male    DOB: 1956/02/19, 62 y.o.   MRN: [id number]  HPI: Lucas Weiss is a 62 y.o. male  Chief Complaint  Patient presents with  . Hypertension  . Hyperlipidemia  . Diabetes  . Depression   DIABETES Hypoglycemic episodes:no Polydipsia/polyuria: no Visual disturbance: no Chest pain: no Paresthesias: no Glucose Monitoring: no  Accucheck frequency: Not Checking Taking Insulin?: no Blood Pressure Monitoring: not checking Retinal Examination: Not up to Date Foot Exam: Up to Date Diabetic Education: Not Completed Pneumovax: Up to Date Influenza: Up to Date Aspirin: yes  HYPERTENSION / HYPERLIPIDEMIA Satisfied with current treatment? yes Duration of hypertension: chronic BP monitoring frequency: not checking BP medication side effects: no Past BP meds: metoprolol, linsinopril Duration of hyperlipidemia: chronic Cholesterol medication side effects: no Cholesterol supplements: none Past cholesterol medications: pravastatin (pravachol) Medication compliance: excellent compliance Aspirin: no Recent stressors: no Recurrent headaches: no Visual changes: no Palpitations: no Dyspnea: no Chest pain: no Lower extremity edema: no Dizzy/lightheaded: no  DEPRESSION Mood status: uncontrolled Satisfied with current treatment?: yes Symptom severity: moderate  Duration of current treatment : Has been off medicine- does not want to take it Side effects: no Medication compliance: poor compliance Psychotherapy/counseling: no  Previous psychiatric medications: se Depressed mood: yes Anxious mood: yes Anhedonia: no Significant weight loss or gain: no Insomnia: yes hard to fall asleep Fatigue: yes Feelings of worthlessness or guilt: yes Impaired  concentration/indecisiveness: yes Suicidal ideations: no Hopelessness: no Crying spells: no Depression screen [facility] 07/21/2017 11/16/2016 10/12/2016 09/14/2016 04/07/2016  Decreased Interest 0 3 3 3 3   Down, Depressed, Hopeless 0 3 3 3 2   PHQ - 2 Score 0 6 6 6 5   Altered sleeping - 3 3 3 3   Tired, decreased energy - 3 3 3 3   Change in appetite - - 3 0 1  Feeling bad or failure about yourself  - 3 3 2 3   Trouble concentrating - 3 3 3 3   Moving slowly or fidgety/restless - 3 2 2 2   Suicidal thoughts - 0 0 0 0  PHQ-9 Score - 21 23 19 20   Difficult doing work/chores - - - - -     Relevant past medical, surgical, family and social history reviewed and updated as indicated. Interim medical history since our last visit reviewed. Allergies and medications reviewed and updated.  Review of Systems  Constitutional: Negative.   Respiratory: Negative.   Cardiovascular: Negative.   Musculoskeletal: Positive for arthralgias, back pain and myalgias. Negative for gait problem, joint swelling, neck pain and neck stiffness.  Skin: Negative.   Psychiatric/Behavioral: Negative.     Per HPI unless specifically indicated above     Objective:    BP 125/76 (BP Location: Left Arm, Patient Position: Sitting, Cuff Size: Normal)   Pulse 82   Temp 98.7 F (37.1 C) (Oral)   Wt 194 lb 1 oz (88 kg)   SpO2 98%   BMI 27.85 kg/m   Wt Readings from Last 3 Encounters:  02/10/18 194 lb 1 oz (88 kg)  11/09/17 197 lb 3 oz (89.4 kg)  09/09/17 191 lb 12.8 oz (87 kg)    Physical Exam  Constitutional: He  is oriented to person, place, and time. He appears well-developed and well-nourished. No distress.  HENT:  Head: Normocephalic and atraumatic.  Right Ear: Hearing normal.  Left Ear: Hearing normal.  Nose: Nose normal.  Eyes: Conjunctivae and lids are normal. Right eye exhibits no discharge. Left eye exhibits no discharge. No scleral icterus.  Cardiovascular: Normal rate, regular rhythm, normal heart  sounds and intact distal pulses. Exam reveals no gallop and no friction rub.  No murmur heard. Pulmonary/Chest: Effort normal and breath sounds normal. No respiratory distress. He has no wheezes. He has no rales. He exhibits no tenderness.  Musculoskeletal: Normal range of motion.  Neurological: He is alert and oriented to person, place, and time.  Skin: Skin is warm, dry and intact. No rash noted. He is not diaphoretic. No erythema. No pallor.  Psychiatric: He has a normal mood and affect. His speech is normal and behavior is normal. Judgment and thought content normal. Cognition and memory are normal.  Nursing note and vitals reviewed.   Results for orders placed or performed in visit on 02/10/18  Microscopic Examination  Result Value Ref Range   WBC, UA 0-5 0 - 5 /hpf   RBC, UA 0-2 0 - 2 /hpf   Epithelial Cells (non renal) 0-10 0 - 10 /hpf   Renal Epithel, UA 0-10 (A) None seen /hpf   Mucus, UA Present Not Estab.   Bacteria, UA None seen None seen/Few  Bayer DCA Hb A1c Waived  Result Value Ref Range   Bayer DCA Hb A1c Waived 6.5 <7.0 %  Microalbumin, Urine Waived  Result Value Ref Range   Microalb, Ur Waived 150 (H) 0 - 19 mg/L   Creatinine, Urine Waived 300 10 - 300 mg/dL   Microalb/Creat Ratio 30-300 (H) <30 mg/g  UA/M w/rflx Culture, Routine  Result Value Ref Range   Specific Gravity, UA >1.030 (H) 1.005 - 1.030   pH, UA 5.5 5.0 - 7.5   Color, UA Orange Yellow   Appearance Ur Hazy (A) Clear   Leukocytes, UA Negative Negative   Protein, UA 2+ (A) Negative/Trace   Glucose, UA Negative Negative   Ketones, UA Trace (A) Negative   RBC, UA Negative Negative   Bilirubin, UA Negative Negative   Urobilinogen, Ur 1.0 0.2 - 1.0 mg/dL   Nitrite, UA Negative Negative   Microscopic Examination See below:       Assessment & Plan:   Problem List Items Addressed This Visit      Cardiovascular and Mediastinum   CAD (coronary artery disease) - Primary    No chest pain. Stable.  Keep BP/cholesterol/DM under control. Continue to monitor. Call with any concerns.       Relevant Medications   metoprolol tartrate (LOPRESSOR) 50 MG tablet   lovastatin (MEVACOR) 20 MG tablet   lisinopril (PRINIVIL,ZESTRIL) 10 MG tablet   Other Relevant Orders   CBC with Differential/Platelet   Comprehensive metabolic panel   TSH   PAD (peripheral artery disease): Left common iliac stent plus additional stent(09/15/12) for ISRS.    No chest pain. Stable. Keep BP/cholesterol/DM under control. Continue to monitor. Call with any concerns.       Relevant Medications   metoprolol tartrate (LOPRESSOR) 50 MG tablet   lovastatin (MEVACOR) 20 MG tablet   lisinopril (PRINIVIL,ZESTRIL) 10 MG tablet   Other Relevant Orders   CBC with Differential/Platelet   Comprehensive metabolic panel   TSH   Hypertensive heart failure (HCC)    No chest pain. Stable.  Keep BP/cholesterol/DM under control. Continue to monitor. Call with any concerns.       Relevant Medications   metoprolol tartrate (LOPRESSOR) 50 MG tablet   lovastatin (MEVACOR) 20 MG tablet   lisinopril (PRINIVIL,ZESTRIL) 10 MG tablet   Other Relevant Orders   CBC with Differential/Platelet   Comprehensive metabolic panel   Microalbumin, Urine Waived (Completed)   TSH   Renal artery stenosis, native, bilateral (HCC)    No chest pain. Stable. Keep BP/cholesterol/DM under control. Continue to monitor. Call with any concerns.       Relevant Medications   metoprolol tartrate (LOPRESSOR) 50 MG tablet   lovastatin (MEVACOR) 20 MG tablet   lisinopril (PRINIVIL,ZESTRIL) 10 MG tablet   Other Relevant Orders   CBC with Differential/Platelet   Comprehensive metabolic panel   TSH   Atherosclerotic PVD with intermittent claudication (HCC)    No chest pain. Stable. Keep BP/cholesterol/DM under control. Continue to monitor. Call with any concerns.       Relevant Medications   metoprolol tartrate (LOPRESSOR) 50 MG tablet   lovastatin  (MEVACOR) 20 MG tablet   lisinopril (PRINIVIL,ZESTRIL) 10 MG tablet   Other Relevant Orders   CBC with Differential/Platelet   Comprehensive metabolic panel   TSH     Endocrine   Controlled type 2 diabetes mellitus (HCC)    Stable with A1c of 6.5. Continue current regimen. Continue to monitor. Call with any concerns.       Relevant Medications   lovastatin (MEVACOR) 20 MG tablet   metFORMIN (GLUCOPHAGE) 500 MG tablet   lisinopril (PRINIVIL,ZESTRIL) 10 MG tablet   Other Relevant Orders   CBC with Differential/Platelet   Bayer DCA Hb A1c Waived (Completed)   Comprehensive metabolic panel   TSH     Other   HLD (hyperlipidemia)    Rechecking levels today. Call with any concerns. Refills given.       Relevant Medications   metoprolol tartrate (LOPRESSOR) 50 MG tablet   lovastatin (MEVACOR) 20 MG tablet   lisinopril (PRINIVIL,ZESTRIL) 10 MG tablet   Other Relevant Orders   CBC with Differential/Platelet   Comprehensive metabolic panel   Lipid Panel w/o Chol/HDL Ratio   TSH   Severe recurrent major depression (HCC)    Not interested in medicine. Does not want to fill out PHQ9. Doesn't really want to discuss depression. Having trouble sleeping. Agrees to restart seroquel to help with sleep. Call with any concerns.       Relevant Orders   CBC with Differential/Platelet   Comprehensive metabolic panel   TSH   Chronic pain    Again offered referral to pain management. He declined. Will let us know if he changes his mind.       Relevant Medications   naproxen (NAPROSYN) 500 MG tablet   Insomnia    Will restart seroquel to see if it will help. Recheck 1 month.        Other Visit Diagnoses    Hesitancy       Checking UA and PSA today. Await results.    Relevant Orders   PSA   UA/M w/rflx Culture, Routine (Completed)       Follow up plan: No Follow-up on file.

## 2018-02-10 NOTE — Assessment & Plan Note (Signed)
Will restart seroquel to see if it will help. Recheck 1 month.

## 2018-02-10 NOTE — Assessment & Plan Note (Signed)
No chest pain. Stable. Keep BP/cholesterol/DM under control. Continue to monitor. Call with any concerns.

## 2018-02-10 NOTE — Assessment & Plan Note (Signed)
Not interested in medicine. Does not want to fill out PHQ9. Doesn't really want to discuss depression. Having trouble sleeping. Agrees to restart seroquel to help with sleep. Call with any concerns.

## 2018-02-10 NOTE — Assessment & Plan Note (Signed)
Rechecking levels today. Call with any concerns. Refills given.

## 2018-02-11 ENCOUNTER — Encounter: Payer: Self-pay | Admitting: Family Medicine

## 2018-02-11 LAB — CBC WITH DIFFERENTIAL/PLATELET
Basophils Absolute: 0.1 10*3/uL (ref 0.0–0.2)
Basos: 1 %
EOS (ABSOLUTE): 0.2 10*3/uL (ref 0.0–0.4)
EOS: 3 %
Hematocrit: 37.9 % (ref 37.5–51.0)
Hemoglobin: 12.9 g/dL — ABNORMAL LOW (ref 13.0–17.7)
Immature Grans (Abs): 0 10*3/uL (ref 0.0–0.1)
Immature Granulocytes: 0 %
LYMPHS ABS: 1.9 10*3/uL (ref 0.7–3.1)
Lymphs: 25 %
MCH: 28.7 pg (ref 26.6–33.0)
MCHC: 34 g/dL (ref 31.5–35.7)
MCV: 84 fL (ref 79–97)
MONOCYTES: 9 %
MONOS ABS: 0.6 10*3/uL (ref 0.1–0.9)
NEUTROS ABS: 4.7 10*3/uL (ref 1.4–7.0)
Neutrophils: 62 %
Platelets: 213 10*3/uL (ref 150–379)
RBC: 4.5 x10E6/uL (ref 4.14–5.80)
RDW: 14.6 % (ref 12.3–15.4)
WBC: 7.4 10*3/uL (ref 3.4–10.8)

## 2018-02-11 LAB — LIPID PANEL W/O CHOL/HDL RATIO
CHOLESTEROL TOTAL: 197 mg/dL (ref 100–199)
HDL: 33 mg/dL — ABNORMAL LOW (ref >39)
Triglycerides: 435 mg/dL — ABNORMAL HIGH (ref 0–149)

## 2018-02-11 LAB — COMPREHENSIVE METABOLIC PANEL
A/G RATIO: 1.6 (ref 1.2–2.2)
ALK PHOS: 69 IU/L (ref 39–117)
ALT: 29 IU/L (ref 0–44)
AST: 21 IU/L (ref 0–40)
Albumin: 4.4 g/dL (ref 3.5–5.5)
BUN/Creatinine Ratio: 19 (ref 9–20)
BUN: 20 mg/dL (ref 6–24)
CHLORIDE: 108 mmol/L — AB (ref 96–106)
CO2: 20 mmol/L (ref 20–29)
Calcium: 9.2 mg/dL (ref 8.7–10.2)
Creatinine, Ser: 1.03 mg/dL (ref 0.76–1.27)
GFR calc Af Amer: 91 mL/min/{1.73_m2} (ref >59)
GFR calc non Af Amer: 79 mL/min/{1.73_m2} (ref >59)
GLOBULIN, TOTAL: 2.7 g/dL (ref 1.5–4.5)
Glucose: 106 mg/dL — ABNORMAL HIGH (ref 65–99)
POTASSIUM: 4.7 mmol/L (ref 3.5–5.2)
SODIUM: 144 mmol/L (ref 134–144)
Total Protein: 7.1 g/dL (ref 6.0–8.5)

## 2018-02-11 LAB — TSH: TSH: 1.19 u[IU]/mL (ref 0.450–4.500)

## 2018-02-11 LAB — PSA: PROSTATE SPECIFIC AG, SERUM: 1.3 ng/mL (ref 0.0–4.0)

## 2018-03-10 ENCOUNTER — Ambulatory Visit (INDEPENDENT_AMBULATORY_CARE_PROVIDER_SITE_OTHER): Payer: Medicare Other | Admitting: Family Medicine

## 2018-03-10 ENCOUNTER — Encounter: Payer: Self-pay | Admitting: Family Medicine

## 2018-03-10 VITALS — BP 127/75 | HR 71 | Temp 98.4°F | Wt 195.5 lb

## 2018-03-10 DIAGNOSIS — I701 Atherosclerosis of renal artery: Secondary | ICD-10-CM | POA: Diagnosis not present

## 2018-03-10 DIAGNOSIS — G4700 Insomnia, unspecified: Secondary | ICD-10-CM

## 2018-03-10 DIAGNOSIS — F332 Major depressive disorder, recurrent severe without psychotic features: Secondary | ICD-10-CM

## 2018-03-10 MED ORDER — TRAZODONE HCL 50 MG PO TABS: 25.0000 mg | ORAL_TABLET | Freq: Every evening | ORAL | 3 refills | Status: DC | PRN

## 2018-03-10 MED ORDER — MIRTAZAPINE 15 MG PO TABS: 15.0000 mg | ORAL_TABLET | Freq: Every day | ORAL | 2 refills | Status: DC

## 2018-03-10 NOTE — Progress Notes (Signed)
BP 127/75 (BP Location: Left Arm, Patient Position: Sitting, Cuff Size: Normal)   Pulse 71   Temp 98.4 F (36.9 C) (Oral)   Wt 195 lb 8 oz (88.7 kg)   SpO2 97%   BMI 28.05 kg/m    Subjective:    Patient ID: Lucas Weiss, male    DOB: May 09, 1956, 62 y.o.   MRN: [id number]  HPI: Lucas Weiss is a 62 y.o. male  Chief Complaint  Patient presents with  . Insomnia    follow up   INSOMNIA- doesn't feel tired. Feels like he has racing thoughts. Does not fall asleep until about 5AM and then sleeps until about 10 or 11AM. Still depressed. Still anxious. Refuses to discuss his anxiety or depression. Refuses to follow up PHQ9 Duration: chronic Satisfied with sleep quality: no Difficulty falling asleep: yes Difficulty staying asleep: yes Waking a few hours after sleep onset: yes Early morning awakenings: no Daytime hypersomnolence: yes Wakes feeling refreshed: no Good sleep hygiene: no Apnea: no Snoring: no Depressed/anxious mood: yes Recent stress: yes Restless legs/nocturnal leg cramps: no Chronic pain/arthritis: yes History of sleep study: no Treatments attempted: melatonin, uinsom and benadryl   Relevant past medical, surgical, family and social history reviewed and updated as indicated. Interim medical history since our last visit reviewed. Allergies and medications reviewed and updated.  Review of Systems  Constitutional: Negative.   Respiratory: Negative.   Cardiovascular: Negative.   Skin: Negative.   Neurological: Negative.   Psychiatric/Behavioral: Positive for agitation, confusion, decreased concentration, dysphoric mood and sleep disturbance. Negative for behavioral problems, hallucinations, self-injury and suicidal ideas. The patient is nervous/anxious. The patient is not hyperactive.     Per HPI unless specifically indicated above     Objective:    BP 127/75 (BP Location: Left Arm, Patient Position: Sitting, Cuff Size: Normal)   Pulse 71   Temp 98.4 F  (36.9 C) (Oral)   Wt 195 lb 8 oz (88.7 kg)   SpO2 97%   BMI 28.05 kg/m   Wt Readings from Last 3 Encounters:  03/10/18 195 lb 8 oz (88.7 kg)  02/10/18 194 lb 1 oz (88 kg)  11/09/17 197 lb 3 oz (89.4 kg)    Physical Exam  Constitutional: He is oriented to person, place, and time. He appears well-developed and well-nourished. No distress.  HENT:  Head: Normocephalic and atraumatic.  Right Ear: Hearing normal.  Left Ear: Hearing normal.  Nose: Nose normal.  Eyes: Conjunctivae and lids are normal. Right eye exhibits no discharge. Left eye exhibits no discharge. No scleral icterus.  Cardiovascular: Normal rate, regular rhythm, normal heart sounds and intact distal pulses. Exam reveals no gallop and no friction rub.  No murmur heard. Pulmonary/Chest: Effort normal and breath sounds normal. No respiratory distress. He has no wheezes. He has no rales. He exhibits no tenderness.  Musculoskeletal: Normal range of motion.  Neurological: He is alert and oriented to person, place, and time.  Skin: Skin is warm, dry and intact. No rash noted. He is not diaphoretic. No erythema. No pallor.  Psychiatric: He has a normal mood and affect. His speech is normal and behavior is normal. Judgment and thought content normal. Cognition and memory are normal.  Nursing note and vitals reviewed.   Results for orders placed or performed in visit on 02/10/18  Microscopic Examination  Result Value Ref Range   WBC, UA 0-5 0 - 5 /hpf   RBC, UA 0-2 0 - 2 /hpf   Epithelial  Cells (non renal) 0-10 0 - 10 /hpf   Renal Epithel, UA 0-10 (A) None seen /hpf   Mucus, UA Present Not Estab.   Bacteria, UA None seen None seen/Few  CBC with Differential/Platelet  Result Value Ref Range   WBC 7.4 3.4 - 10.8 x10E3/uL   RBC 4.50 4.14 - 5.80 x10E6/uL   Hemoglobin 12.9 (L) 13.0 - 17.7 g/dL   Hematocrit 95.2 84.1 - 51.0 %   MCV 84 79 - 97 fL   MCH 28.7 26.6 - 33.0 pg   MCHC 34.0 31.5 - 35.7 g/dL   RDW 32.4 40.1 - 02.7 %     Platelets 213 150 - 379 x10E3/uL   Neutrophils 62 Not Estab. %   Lymphs 25 Not Estab. %   Monocytes 9 Not Estab. %   Eos 3 Not Estab. %   Basos 1 Not Estab. %   Neutrophils Absolute 4.7 1.4 - 7.0 x10E3/uL   Lymphocytes Absolute 1.9 0.7 - 3.1 x10E3/uL   Monocytes Absolute 0.6 0.1 - 0.9 x10E3/uL   EOS (ABSOLUTE) 0.2 0.0 - 0.4 x10E3/uL   Basophils Absolute 0.1 0.0 - 0.2 x10E3/uL   Immature Granulocytes 0 Not Estab. %   Immature Grans (Abs) 0.0 0.0 - 0.1 x10E3/uL  Bayer DCA Hb A1c Waived  Result Value Ref Range   Bayer DCA Hb A1c Waived 6.5 <7.0 %  Comprehensive metabolic panel  Result Value Ref Range   Glucose 106 (H) 65 - 99 mg/dL   BUN 20 6 - 24 mg/dL   Creatinine, Ser 2.53 0.76 - 1.27 mg/dL   GFR calc non Af Amer 79 >59 mL/min/1.73   GFR calc Af Amer 91 >59 mL/min/1.73   BUN/Creatinine Ratio 19 9 - 20   Sodium 144 134 - 144 mmol/L   Potassium 4.7 3.5 - 5.2 mmol/L   Chloride 108 (H) 96 - 106 mmol/L   CO2 20 20 - 29 mmol/L   Calcium 9.2 8.7 - 10.2 mg/dL   Total Protein 7.1 6.0 - 8.5 g/dL   Albumin 4.4 3.5 - 5.5 g/dL   Globulin, Total 2.7 1.5 - 4.5 g/dL   Albumin/Globulin Ratio 1.6 1.2 - 2.2   Bilirubin Total <0.2 0.0 - 1.2 mg/dL   Alkaline Phosphatase 69 39 - 117 IU/L   AST 21 0 - 40 IU/L   ALT 29 0 - 44 IU/L  Lipid Panel w/o Chol/HDL Ratio  Result Value Ref Range   Cholesterol, Total 197 100 - 199 mg/dL   Triglycerides 664 (H) 0 - 149 mg/dL   HDL 33 (L) >40 mg/dL   VLDL Cholesterol Cal Comment 5 - 40 mg/dL   LDL Calculated Comment 0 - 99 mg/dL  Microalbumin, Urine Waived  Result Value Ref Range   Microalb, Ur Waived 150 (H) 0 - 19 mg/L   Creatinine, Urine Waived 300 10 - 300 mg/dL   Microalb/Creat Ratio 30-300 (H) <30 mg/g  PSA  Result Value Ref Range   Prostate Specific Ag, Serum 1.3 0.0 - 4.0 ng/mL  TSH  Result Value Ref Range   TSH 1.190 0.450 - 4.500 uIU/mL  UA/M w/rflx Culture, Routine  Result Value Ref Range   Specific Gravity, UA >1.030 (H) 1.005 -  1.030   pH, UA 5.5 5.0 - 7.5   Color, UA Orange Yellow   Appearance Ur Hazy (A) Clear   Leukocytes, UA Negative Negative   Protein, UA 2+ (A) Negative/Trace   Glucose, UA Negative Negative   Ketones, UA Trace (A)  Negative   RBC, UA Negative Negative   Bilirubin, UA Negative Negative   Urobilinogen, Ur 1.0 0.2 - 1.0 mg/dL   Nitrite, UA Negative Negative   Microscopic Examination See below:       Assessment & Plan:   Problem List Items Addressed This Visit      Other   Severe recurrent major depression (HCC)    Not interested in medicine. Does not want to fill out PHQ9. Doesn't really want to discuss depression. Having trouble sleeping. Seroquel did not help. Will try remeron to see if it helps with sleep and mood. Call with any concerns.       Relevant Medications   traZODone (DESYREL) 50 MG tablet   mirtazapine (REMERON) 15 MG tablet   Insomnia - Primary    Not doing well. Likely due to depression, but does not want to discuss depression and does not want to take any medication at this time. Will start trazodone and remeron for mood and sleep. Recheck 1 month.           Follow up plan: Return in about 1 month (around 04/07/2018) for follow up sleep.

## 2018-03-10 NOTE — Assessment & Plan Note (Signed)
Not doing well. Likely due to depression, but does not want to discuss depression and does not want to take any medication at this time. Will start trazodone and remeron for mood and sleep. Recheck 1 month.

## 2018-03-10 NOTE — Assessment & Plan Note (Signed)
Not interested in medicine. Does not want to fill out PHQ9. Doesn't really want to discuss depression. Having trouble sleeping. Seroquel did not help. Will try remeron to see if it helps with sleep and mood. Call with any concerns.

## 2018-03-18 ENCOUNTER — Other Ambulatory Visit: Payer: Self-pay | Admitting: Family Medicine

## 2018-04-12 ENCOUNTER — Ambulatory Visit: Payer: Medicare Other | Admitting: Family Medicine

## 2018-05-19 ENCOUNTER — Telehealth: Payer: Self-pay | Admitting: Family Medicine

## 2018-05-19 NOTE — Telephone Encounter (Signed)
Copied from CRM [id number]. Topic: General - Other >> May 19, 2018  4:23 PM Elliot Gault wrote: Relation to [id number] Call back number: [phone] Pharmacy: [facility] [place], [place] - [address] [phone] ([phone] (Fax)  Reason for call:  Patient requesting lisinopril (PRINIVIL,ZESTRIL) 10 MG tablet , pharmacy informed patient several faxes were sent over last week. Patient in need of clarity regarding frequency stating PCP advised 2x daily 1 in the morning and 1 in the afternoon but the bottle states 1x daily,patient states he has been out for 1 week, please advise  >> May 19, 2018  4:27 PM Elliot Gault wrote: Relation to [id number] Call back number: [phone] Pharmacy: [facility] [place], [place] - [address] [phone] ([phone] (Fax)  Reason for call:  Patient requesting lisinopril (PRINIVIL,ZESTRIL) 10 MG tablet , pharmacy informed patient several faxes were sent over last week. Patient in need of clarity regarding frequency stating PCP advised 2x daily 1 in the morning and 1 in the afternoon but the bottle states 1x daily,patient states he has been out for 1 week, please advise

## 2018-05-20 MED ORDER — LISINOPRIL 10 MG PO TABS: 10.0000 mg | ORAL_TABLET | Freq: Every day | ORAL | 1 refills | Status: DC

## 2018-05-20 NOTE — Telephone Encounter (Signed)
Per my last note, patient is to be taking 1 of those a day- that is the only thing written on the Rx. We have not gotten any faxes to my knowledge about this. Can you please find out what Leanard has been taking? Thanks!

## 2018-05-20 NOTE — Telephone Encounter (Signed)
Patient states that he has been taking 2 a day, please send a new prescription for him since he was taking two a day and ran out early.

## 2018-07-22 ENCOUNTER — Ambulatory Visit: Payer: Medicare Other

## 2018-07-25 ENCOUNTER — Ambulatory Visit (INDEPENDENT_AMBULATORY_CARE_PROVIDER_SITE_OTHER): Payer: Medicare Other

## 2018-07-25 VITALS — BP 138/62 | HR 72 | Temp 98.2°F | Resp 16 | Ht 70.0 in | Wt 190.4 lb

## 2018-07-25 DIAGNOSIS — Z0000 Encounter for general adult medical examination without abnormal findings: Secondary | ICD-10-CM | POA: Diagnosis not present

## 2018-07-25 NOTE — Patient Instructions (Addendum)
[name] , Thank you for taking time to come for your Medicare Wellness Visit. I appreciate your ongoing commitment to your health goals. Please review the following plan we discussed and let me know if I can assist you in the future.   Screening recommendations/referrals: Colonoscopy: completed 02/06/2016 Recommended yearly ophthalmology/optometry visit for glaucoma screening and checkup Recommended yearly dental visit for hygiene and checkup  Vaccinations: Influenza vaccine: due 08/2018 Pneumococcal vaccine: due at age 60  Tdap vaccine: up to date Shingles vaccine: shingrix eligible, check with your insurance company for coverage    Advanced directives: Advance directive discussed with you today. I have provided a copy for you to complete at home and have notarized. Once this is complete please bring a copy in to our office so we can scan it into your chart.  Conditions/risks identified: Recommend drinking at least 6-8 glasses of water a day   Next appointment: Follow up in one year for your annual wellness exam.   Preventive Care 40-64 Years, Male Preventive care refers to lifestyle choices and visits with your health care provider that can promote health and wellness. What does preventive care include?  A yearly physical exam. This is also called an annual well check.  Dental exams once or twice a year.  Routine eye exams. Ask your health care provider how often you should have your eyes checked.  Personal lifestyle choices, including:  Daily care of your teeth and gums.  Regular physical activity.  Eating a healthy diet.  Avoiding tobacco and drug use.  Limiting alcohol use.  Practicing safe sex.  Taking low-dose aspirin every day starting at age 60. What happens during an annual well check? The services and screenings done by your health care provider during your annual well check will depend on your age, overall health, lifestyle risk factors, and family history of  disease. Counseling  Your health care provider may ask you questions about your:  Alcohol use.  Tobacco use.  Drug use.  Emotional well-being.  Home and relationship well-being.  Sexual activity.  Eating habits.  Work and work Astronomerenvironment. Screening  You may have the following tests or measurements:  Height, weight, and BMI.  Blood pressure.  Lipid and cholesterol levels. These may be checked every 5 years, or more frequently if you are over 60 years old.  Skin check.  Lung cancer screening. You may have this screening every year starting at age 60 if you have a 30-pack-year history of smoking and currently smoke or have quit within the past 15 years.  Fecal occult blood test (FOBT) of the stool. You may have this test every year starting at age 60.  Flexible sigmoidoscopy or colonoscopy. You may have a sigmoidoscopy every 5 years or a colonoscopy every 10 years starting at age 60.  Prostate cancer screening. Recommendations will vary depending on your family history and other risks.  Hepatitis C blood test.  Hepatitis B blood test.  Sexually transmitted disease (STD) testing.  Diabetes screening. This is done by checking your blood sugar (glucose) after you have not eaten for a while (fasting). You may have this done every 1-3 years. Discuss your test results, treatment options, and if necessary, the need for more tests with your health care provider. Vaccines  Your health care provider may recommend certain vaccines, such as:  Influenza vaccine. This is recommended every year.  Tetanus, diphtheria, and acellular pertussis (Tdap, Td) vaccine. You may need a Td booster every 10 years.  Zoster  vaccine. You may need this after age 39.  Pneumococcal 13-valent conjugate (PCV13) vaccine. You may need this if you have certain conditions and have not been vaccinated.  Pneumococcal polysaccharide (PPSV23) vaccine. You may need one or two doses if you smoke cigarettes  or if you have certain conditions. Talk to your health care provider about which screenings and vaccines you need and how often you need them. This information is not intended to replace advice given to you by your health care provider. Make sure you discuss any questions you have with your health care provider. Document Released: 01/03/2016 Document Revised: 08/26/2016 Document Reviewed: 10/08/2015 Elsevier Interactive Patient Education  2017 [place] Prevention in the Home Falls can cause injuries. They can happen to people of all ages. There are many things you can do to make your home safe and to help prevent falls. What can I do on the outside of my home?  Regularly fix the edges of walkways and driveways and fix any cracks.  Remove anything that might make you trip as you walk through a door, such as a raised step or threshold.  Trim any bushes or trees on the path to your home.  Use bright outdoor lighting.  Clear any walking paths of anything that might make someone trip, such as rocks or tools.  Regularly check to see if handrails are loose or broken. Make sure that both sides of any steps have handrails.  Any raised decks and porches should have guardrails on the edges.  Have any leaves, snow, or ice cleared regularly.  Use sand or salt on walking paths during winter.  Clean up any spills in your garage right away. This includes oil or grease spills. What can I do in the bathroom?  Use night lights.  Install grab bars by the toilet and in the tub and shower. Do not use towel bars as grab bars.  Use non-skid mats or decals in the tub or shower.  If you need to sit down in the shower, use a plastic, non-slip stool.  Keep the floor dry. Clean up any water that spills on the floor as soon as it happens.  Remove soap buildup in the tub or shower regularly.  Attach bath mats securely with double-sided non-slip rug tape.  Do not have throw rugs and other  things on the floor that can make you trip. What can I do in the bedroom?  Use night lights.  Make sure that you have a light by your bed that is easy to reach.  Do not use any sheets or blankets that are too big for your bed. They should not hang down onto the floor.  Have a firm chair that has side arms. You can use this for support while you get dressed.  Do not have throw rugs and other things on the floor that can make you trip. What can I do in the kitchen?  Clean up any spills right away.  Avoid walking on wet floors.  Keep items that you use a lot in easy-to-reach places.  If you need to reach something above you, use a strong step stool that has a grab bar.  Keep electrical cords out of the way.  Do not use floor polish or wax that makes floors slippery. If you must use wax, use non-skid floor wax.  Do not have throw rugs and other things on the floor that can make you trip. What can I do with my  stairs?  Do not leave any items on the stairs.  Make sure that there are handrails on both sides of the stairs and use them. Fix handrails that are broken or loose. Make sure that handrails are as long as the stairways.  Check any carpeting to make sure that it is firmly attached to the stairs. Fix any carpet that is loose or worn.  Avoid having throw rugs at the top or bottom of the stairs. If you do have throw rugs, attach them to the floor with carpet tape.  Make sure that you have a light switch at the top of the stairs and the bottom of the stairs. If you do not have them, ask someone to add them for you. What else can I do to help prevent falls?  Wear shoes that:  Do not have high heels.  Have rubber bottoms.  Are comfortable and fit you well.  Are closed at the toe. Do not wear sandals.  If you use a stepladder:  Make sure that it is fully opened. Do not climb a closed stepladder.  Make sure that both sides of the stepladder are locked into place.  Ask  someone to hold it for you, if possible.  Clearly mark and make sure that you can see:  Any grab bars or handrails.  First and last steps.  Where the edge of each step is.  Use tools that help you move around (mobility aids) if they are needed. These include:  Canes.  Walkers.  Scooters.  Crutches.  Turn on the lights when you go into a dark area. Replace any light bulbs as soon as they burn out.  Set up your furniture so you have a clear path. Avoid moving your furniture around.  If any of your floors are uneven, fix them.  If there are any pets around you, be aware of where they are.  Review your medicines with your doctor. Some medicines can make you feel dizzy. This can increase your chance of falling. Ask your doctor what other things that you can do to help prevent falls. This information is not intended to replace advice given to you by your health care provider. Make sure you discuss any questions you have with your health care provider. Document Released: 10/03/2009 Document Revised: 05/14/2016 Document Reviewed: 01/11/2015 Elsevier Interactive Patient Education  2017 [employer].

## 2018-07-25 NOTE — Progress Notes (Signed)
Subjective:   Lucas Weiss is a 62 y.o. male who presents for Medicare Annual/Subsequent preventive examination.  Review of Systems:   Cardiac Risk Factors include: advanced age (>92men, >23 women);diabetes mellitus;dyslipidemia;hypertension;male gender;smoking/ tobacco exposure     Objective:    Vitals: BP 138/62 (BP Location: Left Arm, Patient Position: Sitting)   Pulse 72   Temp 98.2 F (36.8 C) (Temporal)   Resp 16   Ht 5\' 10"  (1.778 m)   Wt 190 lb 6.4 oz (86.4 kg)   BMI 27.32 kg/m   Body mass index is 27.32 kg/m.  Advanced Directives 07/25/2018 07/21/2017 05/12/2016 11/11/2015 09/15/2012 09/15/2012  Does Patient Have a Medical Advance Directive? No No No No Patient does not have advance directive;Patient would like information -  Would patient like information on creating a medical advance directive? Yes (MAU/Ambulatory/Procedural Areas - Information given) Yes (MAU/Ambulatory/Procedural Areas - Information given) Yes - Educational materials given - Referral made to social work -  Pre-existing out of facility DNR order (yellow form or pink MOST form) - - - - - No    Tobacco Social History   Tobacco Use  Smoking Status Former Smoker  . Packs/day: 1.50  . Years: 30.00  . Pack years: 45.00  . Types: Cigarettes  . Last attempt to quit: 07/19/2008  . Years since quitting: 10.0  Smokeless Tobacco Never Used     Counseling given: Not Answered   Clinical Intake:  Pre-visit preparation completed: Yes  Pain : No/denies pain     Nutritional Status: BMI 25 -29 Overweight Nutritional Risks: None Diabetes: Yes CBG done?: No Did pt. bring in CBG monitor from home?: No  How often do you need to have someone help you when you read instructions, pamphlets, or other written materials from your doctor or pharmacy?: 1 - Never What is the last grade level you completed in school?: 11th grade  Interpreter Needed?: No  Information entered by :: Tiffany Hill,LPN   Past  Medical History:  Diagnosis Date  . Alcohol abuse, in remission   . Avascular necrosis of bone of left hip (HCC) 10/15   Total hip replacement recommended by ortho, vascular says it's OK  . Avascular necrosis of bone of left hip (HCC)    Total hip replacement recommended by ortho, vascular says he can have it done.    . Bleeding hemorrhoids   . CHF (congestive heart failure) (HCC)   . Chronic low back pain   . Complication of anesthesia    "woke up twice during procedures @ " (09/15/2012)  . Coronary artery disease   . Diabetes mellitus without complication (HCC) 01/09/16   A1c 6.8  . Diverticulosis of sigmoid colon   . DVT (deep venous thrombosis) (HCC)   . Dyslexia   . H/O hiatal hernia   . Head injury, acute, with loss of consciousness (HCC)    as a child  . High cholesterol   . Hypertension   . Impaired fasting glucose    Last A1c 6.1  . Myocardial infarction (HCC)   . Neuropathy   . PAD (peripheral artery disease) (HCC)    07/2012:  right ABI 0.64, left ABI 0.57.  Marland Kitchen Pneumonia    history of   . Primary osteoarthritis of left hip   . Renal artery stenosis (HCC)    Bilateral   . Severe recurrent major depression without psychotic features (HCC)   . Shortness of breath dyspnea    with exertion - due to pain  .  Stroke [facility](HCC) 2009   "after CABG; ~ blind left eye since" (09/15/2012)- memory loss   . Tobacco abuse    Quit in 2009   Past Surgical History:  Procedure Laterality Date  . 2D Echo  02/17/11   EF 50-55%, mild LVH, No WMA, mild MR, mild TR  . CARDIAC CATHETERIZATION  ?2009  . CARDIOVASCULAR STRESS TEST  08/10/12   Lexiscan: EF 70%, Attenuation artifact in inferior region.  No ischemia or infarct in remaining myocardium. Low risk  . COLONOSCOPY W/ POLYPECTOMY     "no cancer"  . CORONARY ARTERY BYPASS GRAFT  2009   2009, CABG X5, LIMA to LAD, SVG to Diagnonal, Sequential SVG to OM 2-3, SVG to PDA; Dr. Laneta SimmersBartle  . INSERTION OF ILIAC STENT Left 09/15/2012    Procedure: INSERTION OF ILIAC STENT;  Surgeon: Runell GessJonathan J Berry, MD;  [facility];  Service: Cardiovascular;  Laterality: Left;  lt iliac stent  . LOWER EXTREMITY ANGIOGRAM N/A 09/15/2012   Procedure: LOWER EXTREMITY ANGIOGRAM;  Surgeon: Runell GessJonathan J Berry, MD;  [facility];  Service: Cardiovascular;  Laterality: N/A;  . PERIPHERAL ARTERIAL STENT GRAFT Left 06/20/2012; ~ 07/2012; 09/15/2012   left; left; left  . PV angiogram  09/15/12   restenting of left common iliac for ISRS.  Bilateral occluded SFAs.  Renal artery stenosis: 70% right inferior renal artery, 90% left inferior renal artery stenosis.   Marland Kitchen. TOTAL HIP ARTHROPLASTY Left 05/19/2016   Procedure: LEFT TOTAL HIP ARTHROPLASTY ANTERIOR APPROACH;  Surgeon: Kathryne Hitchhristopher Y Blackman, MD;  Location: [facility] OR;  Service: Orthopedics;  Laterality: Left;   Family History  Problem Relation Age of Onset  . Diabetes Mother   . Heart disease Mother   . Hyperlipidemia Mother   . Hypertension Mother   . Heart attack Mother   . Thyroid disease Mother   . Hyperlipidemia Father   . Hypertension Father   . Heart attack Father   . Heart disease Father        before age 60  . Alcohol abuse Father   . Lung disease Father   . Diabetes Sister   . Hyperlipidemia Sister   . Hypertension Sister   . Cancer Sister   . Heart disease Brother        before age 60  . Hyperlipidemia Brother   . Hypertension Brother   . Heart attack Brother   . Diabetes Brother   . Heart disease Brother   . Hyperlipidemia Brother   . Hypertension Brother   . Stroke Brother   . Heart attack Brother   . Heart disease Sister   . Hypertension Sister   . Hyperlipidemia Sister    Social History   Socioeconomic History  . Marital status: Divorced    Spouse name: Not on file  . Number of children: Not on file  . Years of education: Not on file  . Highest education level: 11th grade  Occupational History  . Not on file  Social Needs  . Financial resource  strain: Not hard at all  . Food insecurity:    Worry: Never true    Inability: Never true  . Transportation needs:    Medical: No    Non-medical: No  Tobacco Use  . Smoking status: Former Smoker    Packs/day: 1.50    Years: 30.00    Pack years: 45.00    Types: Cigarettes    Last attempt to quit: 07/19/2008    Years since quitting:  10.0  . Smokeless tobacco: Never Used  Substance and Sexual Activity  . Alcohol use: No    Comment: Former Alcoholic, not drinking anymore. 09/15/2012 "3-4 beers; shot of crown @ least 2 nights/wk"  . Drug use: No  . Sexual activity: Not Currently  Lifestyle  . Physical activity:    Days per week: 0 days    Minutes per session: 0 min  . Stress: Not at all  Relationships  . Social connections:    Talks on phone: More than three times a week    Gets together: More than three times a week    Attends religious service: More than 4 times per year    Active member of club or organization: No    Attends meetings of clubs or organizations: Never    Relationship status: Divorced  Other Topics Concern  . Not on file  Social History Narrative  . Not on file    Outpatient Encounter Medications as of 07/25/2018  Medication Sig  . lisinopril (PRINIVIL,ZESTRIL) 10 MG tablet Take 1 tablet (10 mg total) by mouth daily.  Marland Kitchen lovastatin (MEVACOR) 20 MG tablet Take 2 tablets (40 mg total) by mouth at bedtime.  . metFORMIN (GLUCOPHAGE) 500 MG tablet Take 2 tablets (1,000 mg total) by mouth 2 (two) times daily with a meal.  . metoprolol tartrate (LOPRESSOR) 50 MG tablet Take 1 tablet (50 mg total) by mouth daily.  . naproxen (NAPROSYN) 500 MG tablet Take 1 tablet (500 mg total) by mouth 2 (two) times daily with a meal.  . metoprolol tartrate (LOPRESSOR) 25 MG tablet TAKE ONE TABLET BY MOUTH EVERY MORNING AND 1/2 TABLET AT NIGHT (Patient not taking: Reported on 07/25/2018)  . mirtazapine (REMERON) 15 MG tablet Take 1 tablet (15 mg total) by mouth at bedtime. (Patient not  taking: Reported on 07/25/2018)  . nitroGLYCERIN (NITROSTAT) 0.4 MG SL tablet Place 1 tablet (0.4 mg total) under the tongue every 5 (five) minutes as needed for chest pain. (Patient not taking: Reported on 07/25/2018)  . traZODone (DESYREL) 50 MG tablet Take 0.5-1 tablets (25-50 mg total) by mouth at bedtime as needed for sleep. (Patient not taking: Reported on 07/25/2018)   No facility-administered encounter medications on file as of 07/25/2018.     Activities of Daily Living In your present state of health, do you have any difficulty performing the following activities: 07/25/2018  Hearing? N  Vision? Y  Comment blind in left eye   Difficulty concentrating or making decisions? N  Walking or climbing stairs? N  Dressing or bathing? N  Doing errands, shopping? N  Preparing Food and eating ? N  Using the Toilet? N  In the past six months, have you accidently leaked urine? N  Do you have problems with loss of bowel control? N  Managing your Medications? N  Managing your Finances? N  Housekeeping or managing your Housekeeping? N  Some recent data might be hidden    Patient Care Team: Dorcas Carrow, DO as PCP - General (Family Medicine) Kathryne Hitch, MD as Consulting Physician (Orthopedic Surgery)   Assessment:   This is a routine wellness examination for David.  Exercise Activities and Dietary recommendations Current Exercise Habits: The patient does not participate in regular exercise at present, Exercise limited by: None identified  Goals    . DIET - INCREASE WATER INTAKE     Recommend drinking at least 6-8 glasses of water a day        Fall  Risk Fall Risk  07/25/2018 07/21/2017 05/06/2017 01/09/2016  Falls in the past year? No No No Yes  Number falls in past yr: - - - 1  Injury with Fall? - - - No  Risk for fall due to : - - - Impaired mobility   Is the patient's home free of loose throw rugs in walkways, pet beds, electrical cords, etc?   yes       Grab bars in the  bathroom? no      Handrails on the stairs?   no      Adequate lighting?   yes  Timed Get Up and Go Performed: Completed in 8 seconds with no use of assistive devices, steady gait. No intervention needed at this time.   Depression Screen PHQ 2/9 Scores 07/25/2018 08/09/2017 07/21/2017 11/16/2016  PHQ - 2 Score 0 - 0 6  PHQ- 9 Score - - - 21  Exception Documentation - Patient refusal - -    Cognitive Function     6CIT Screen 07/25/2018 07/21/2017  What Year? 0 points 0 points  What month? 0 points 0 points  What time? 0 points 0 points  Count back from 20 0 points 0 points  Months in reverse 0 points 0 points  Repeat phrase 6 points 2 points  Total Score 6 2    Immunization History  Administered Date(s) Administered  . Influenza Split 09/16/2012  . Influenza,inj,Quad PF,6+ Mos 09/06/2015, 09/14/2016, 09/09/2017  . Influenza-Unspecified 09/12/2014  . Pneumococcal Polysaccharide-23 05/28/2010, 05/20/2016  . Tdap 05/28/2010    Qualifies for Shingles Vaccine? Yes, discussed shingrix vaccine   Screening Tests Health Maintenance  Topic Date Due  . FOOT EXAM  02/04/2018  . INFLUENZA VACCINE  07/21/2018  . OPHTHALMOLOGY EXAM  09/10/2018 (Originally 03/05/1966)  . COLON CANCER SCREENING ANNUAL FOBT  09/10/2018 (Originally 02/05/2017)  . COLONOSCOPY  02/05/2026 (Originally 02/05/2017)  . HEMOGLOBIN A1C  08/10/2018  . TETANUS/TDAP  05/28/2020  . PNEUMOCOCCAL POLYSACCHARIDE VACCINE  Completed  . Hepatitis C Screening  Completed  . HIV Screening  Completed   Cancer Screenings: Lung: Low Dose CT Chest recommended if Age 31-80 years, 30 pack-year currently smoking OR have quit w/in 15years. Patient does qualify. Colorectal: completed 02/06/2016  Additional Screenings:  Hepatitis C Screening: completed 10/08/2015      Plan:    I have personally reviewed and addressed the Medicare Annual Wellness questionnaire and have noted the following in the patient's chart:  A. Medical and social  history B. Use of alcohol, tobacco or illicit drugs  C. Current medications and supplements D. Functional ability and status E.  Nutritional status F.  Physical activity G. Advance directives H. List of other physicians I.  Hospitalizations, surgeries, and ER visits in previous 12 months J.  Vitals K. Screenings such as hearing and vision if needed, cognitive and depression L. Referrals and appointments   In addition, I have reviewed and discussed with patient certain preventive protocols, quality metrics, and best practice recommendations. A written personalized care plan for preventive services as well as general preventive health recommendations were provided to patient.   Signed,  Marin Roberts, LPN Nurse Health Advisor   Nurse Notes: due for diabetic foot exam

## 2018-10-17 ENCOUNTER — Other Ambulatory Visit: Payer: Self-pay | Admitting: Family Medicine

## 2018-10-17 ENCOUNTER — Ambulatory Visit (INDEPENDENT_AMBULATORY_CARE_PROVIDER_SITE_OTHER): Payer: Medicare Other

## 2018-10-17 DIAGNOSIS — Z23 Encounter for immunization: Secondary | ICD-10-CM

## 2018-10-17 DIAGNOSIS — E782 Mixed hyperlipidemia: Secondary | ICD-10-CM

## 2018-10-18 NOTE — Telephone Encounter (Signed)
Requested Prescriptions  Pending Prescriptions Disp Refills  . lovastatin (MEVACOR) 20 MG tablet [Pharmacy Med Name: LOVASTATIN 20 MG TABLET] 180 tablet 0    Sig: TAKE TWO TABLETS BY MOUTH EVERY NIGHT AT BEDTIME     Cardiovascular:  Antilipid - Statins Failed - 10/17/2018 10:42 AM      Failed - HDL in normal range and within 360 days    HDL  Date Value Ref Range Status  02/10/2018 33 (L) >39 mg/dL Final         Failed - Triglycerides in normal range and within 360 days    Triglycerides  Date Value Ref Range Status  02/10/2018 435 (H) 0 - 149 mg/dL Final         Passed - Total Cholesterol in normal range and within 360 days    Cholesterol, Total  Date Value Ref Range Status  02/10/2018 197 100 - 199 mg/dL Final         Passed - LDL in normal range and within 360 days    LDL Calculated  Date Value Ref Range Status  02/10/2018 Comment 0 - 99 mg/dL Final    Comment:    Triglyceride result indicated is too high for an accurate LDL cholesterol estimation.          Passed - Patient is not pregnant      Passed - Valid encounter within last 12 months    Recent Outpatient Visits          7 months ago Insomnia, unspecified type   [place], Megan P, DO   8 months ago Coronary artery disease involving native coronary artery of native heart with angina pectoris ([place])   [place], Megan P, DO   11 months ago Uncontrolled type 2 diabetes mellitus with hyperglycemia ([place])   [place], Megan P, DO   1 year ago Hypertensive heart failure ([place])   [place], Megan P, DO   1 year ago Uncontrolled type 2 diabetes mellitus with hyperglycemia, without long-term current use of insulin ([place])   [place], Megan P, DO      Future Appointments            In 9 months [place]          . metFORMIN (GLUCOPHAGE) 500 MG tablet [Pharmacy Med Name: metFORMIN  HCL 500 MG TABLET] 360 tablet 0    Sig: TAKE TWO TABLETS BY MOUTH TWICE A DAY WITH FOOD     Endocrinology:  Diabetes - Biguanides Failed - 10/17/2018 10:42 AM      Failed - HBA1C is between 0 and 7.9 and within 180 days    Hgb A1c MFr Bld  Date Value Ref Range Status  05/12/2016 7.1 (H) 4.8 - 5.6 % Final    Comment:    (NOTE)         Pre-diabetes: 5.7 - 6.4         Diabetes: >6.4         Glycemic control for adults with diabetes: <7.0          Passed - Cr in normal range and within 360 days    Creatinine  Date Value Ref Range Status  06/02/2012 1.10 0.60 - 1.30 mg/dL Final   Creatinine, Ser  Date Value Ref Range Status  02/10/2018 1.03 0.76 - 1.27 mg/dL Final         Passed - eGFR in normal  range and within 360 days    EGFR (African American)  Date Value Ref Range Status  06/02/2012 >60  Final   GFR calc Af Amer  Date Value Ref Range Status  02/10/2018 91 >59 mL/min/1.73 Final   EGFR (Non-African Amer.)  Date Value Ref Range Status  06/02/2012 >60  Final    Comment:    eGFR values <61m/min/1.73 m2 may be an indication of chronic kidney disease (CKD). Calculated eGFR is useful in patients with stable renal function. The eGFR calculation will not be reliable in acutely ill patients when serum creatinine is changing rapidly. It is not useful in  patients on dialysis. The eGFR calculation may not be applicable to patients at the low and high extremes of body sizes, pregnant women, and vegetarians.    GFR calc non Af Amer  Date Value Ref Range Status  02/10/2018 79 >59 mL/min/1.73 Final         Passed - Valid encounter within last 6 months    Recent Outpatient Visits          7 months ago Insomnia, unspecified type   [place] P, DO   8 months ago Coronary artery disease involving native coronary artery of native heart with angina pectoris ([facility]   [facility]   11 months ago Uncontrolled type 2  diabetes mellitus with hyperglycemia ([place]   [place], [place]   1 year ago Hypertensive heart failure ([facility]   [facility] [place] [facility], [place]   1 year ago Uncontrolled type 2 diabetes mellitus with hyperglycemia, without long-term current use of insulin ([place]   [place], DO      Future Appointments            In 9 months [facility]

## 2018-11-15 ENCOUNTER — Other Ambulatory Visit: Payer: Self-pay | Admitting: Family Medicine

## 2018-11-15 DIAGNOSIS — I110 Hypertensive heart disease with heart failure: Secondary | ICD-10-CM

## 2018-12-23 ENCOUNTER — Other Ambulatory Visit: Payer: Self-pay | Admitting: Family Medicine

## 2018-12-23 DIAGNOSIS — I110 Hypertensive heart disease with heart failure: Secondary | ICD-10-CM

## 2018-12-23 MED ORDER — METOPROLOL TARTRATE 50 MG PO TABS: 50.0000 mg | ORAL_TABLET | Freq: Every day | ORAL | 1 refills | Status: DC

## 2018-12-23 NOTE — Telephone Encounter (Signed)
Copied from CRM [id number]. Topic: Quick Communication - Rx Refill/Question >> Dec 23, 2018 12:37 PM Angela Nevin wrote: Medication: metoprolol tartrate (LOPRESSOR) 50 MG tablet   Patient is requesting a refill of this medication.   Preferred Pharmacy (with phone number or street name):[facility] [address] [place], [place] - [address] [phone] (Phone) [phone] (Fax)

## 2019-01-23 ENCOUNTER — Other Ambulatory Visit: Payer: Self-pay | Admitting: Family Medicine

## 2019-01-23 DIAGNOSIS — I110 Hypertensive heart disease with heart failure: Secondary | ICD-10-CM

## 2019-01-23 DIAGNOSIS — E782 Mixed hyperlipidemia: Secondary | ICD-10-CM

## 2019-01-23 MED ORDER — LISINOPRIL 10 MG PO TABS: 10.0000 mg | ORAL_TABLET | Freq: Every day | ORAL | 0 refills | Status: DC

## 2019-01-23 MED ORDER — METOPROLOL TARTRATE 50 MG PO TABS: 50.0000 mg | ORAL_TABLET | Freq: Every day | ORAL | 0 refills | Status: DC

## 2019-01-23 MED ORDER — LOVASTATIN 20 MG PO TABS: ORAL_TABLET | ORAL | 0 refills | Status: DC

## 2019-01-23 MED ORDER — METFORMIN HCL 500 MG PO TABS: ORAL_TABLET | ORAL | 0 refills | Status: DC

## 2019-01-23 NOTE — Telephone Encounter (Addendum)
Patient called and advised he will need to schedule an appointment and come in to see the doctor in order to continue to receive refills on medications, patient verbalized understanding. Appointment scheduled for a physical on 02/14/19 at 1400 with Dr. Laural Benes.

## 2019-01-23 NOTE — Telephone Encounter (Signed)
Copied from CRM [id number]. Topic: Quick Communication - Rx Refill/Question >> Jan 23, 2019  3:30 PM Jaquita Rector A wrote: Medication: lovastatin (MEVACOR) 20 MG tablet, metFORMIN (GLUCOPHAGE) 500 MG tablet, metoprolol tartrate (LOPRESSOR) 50 MG tablet, lisinopril (PRINIVIL,ZESTRIL) 10 MG tablet  Has the patient contacted their pharmacy? Yes.   (Agent: If no, request that the patient contact the pharmacy for the refill.) (Agent: If yes, when and what did the pharmacy advise?)  Preferred Pharmacy (with phone number or street name): [facility] [address] - [place], [place] - [address] [phone] (Phone) [phone] (Fax)    Agent: Please be advised that RX refills may take up to 3 business days. We ask that you follow-up with your pharmacy.

## 2019-01-23 NOTE — Telephone Encounter (Signed)
Courtesy refill until appointment 02/14/19. Must keep 02/14/19 appointment for additional refills.

## 2019-02-08 ENCOUNTER — Telehealth: Payer: Self-pay | Admitting: Family Medicine

## 2019-02-08 MED ORDER — METFORMIN HCL 500 MG PO TABS: ORAL_TABLET | ORAL | 0 refills | Status: DC

## 2019-02-08 NOTE — Telephone Encounter (Signed)
Sent in more to get him through until his upcoming f/u

## 2019-02-08 NOTE — Telephone Encounter (Signed)
Spoke with patient to give him the information per Fleet Contras.

## 2019-02-08 NOTE — Telephone Encounter (Signed)
Copied from CRM [id number]. Topic: Quick Communication - See Telephone Encounter >> Feb 08, 2019  4:26 PM Terisa Starr wrote: CRM for notification. See Telephone encounter for: 02/08/19.  Patient states he is completely out his metFORMIN (GLUCOPHAGE) 500 MG tablet and was given a courtesy refill on 2/3 for 44 tablets. He takes 4 a day/ he is out of this medication and said he stopped by the office today to address this. Please advise.

## 2019-02-14 ENCOUNTER — Ambulatory Visit (INDEPENDENT_AMBULATORY_CARE_PROVIDER_SITE_OTHER): Payer: Medicare Other | Admitting: Family Medicine

## 2019-02-14 ENCOUNTER — Encounter: Payer: Self-pay | Admitting: Family Medicine

## 2019-02-14 VITALS — BP 138/79 | HR 66 | Temp 98.7°F | Ht 70.87 in | Wt 186.0 lb

## 2019-02-14 DIAGNOSIS — I701 Atherosclerosis of renal artery: Secondary | ICD-10-CM

## 2019-02-14 DIAGNOSIS — I25119 Atherosclerotic heart disease of native coronary artery with unspecified angina pectoris: Secondary | ICD-10-CM | POA: Diagnosis not present

## 2019-02-14 DIAGNOSIS — Z1211 Encounter for screening for malignant neoplasm of colon: Secondary | ICD-10-CM

## 2019-02-14 DIAGNOSIS — I70219 Atherosclerosis of native arteries of extremities with intermittent claudication, unspecified extremity: Secondary | ICD-10-CM | POA: Diagnosis not present

## 2019-02-14 DIAGNOSIS — Z951 Presence of aortocoronary bypass graft: Secondary | ICD-10-CM

## 2019-02-14 DIAGNOSIS — F331 Major depressive disorder, recurrent, moderate: Secondary | ICD-10-CM

## 2019-02-14 DIAGNOSIS — E782 Mixed hyperlipidemia: Secondary | ICD-10-CM | POA: Diagnosis not present

## 2019-02-14 DIAGNOSIS — G4700 Insomnia, unspecified: Secondary | ICD-10-CM

## 2019-02-14 DIAGNOSIS — I10 Essential (primary) hypertension: Secondary | ICD-10-CM

## 2019-02-14 DIAGNOSIS — F332 Major depressive disorder, recurrent severe without psychotic features: Secondary | ICD-10-CM

## 2019-02-14 DIAGNOSIS — R3911 Hesitancy of micturition: Secondary | ICD-10-CM | POA: Diagnosis not present

## 2019-02-14 DIAGNOSIS — I110 Hypertensive heart disease with heart failure: Secondary | ICD-10-CM | POA: Diagnosis not present

## 2019-02-14 DIAGNOSIS — E1165 Type 2 diabetes mellitus with hyperglycemia: Secondary | ICD-10-CM | POA: Diagnosis not present

## 2019-02-14 DIAGNOSIS — G894 Chronic pain syndrome: Secondary | ICD-10-CM | POA: Diagnosis not present

## 2019-02-14 LAB — UA/M W/RFLX CULTURE, ROUTINE
Bilirubin, UA: NEGATIVE
Glucose, UA: NEGATIVE
KETONES UA: NEGATIVE
Leukocytes, UA: NEGATIVE
Nitrite, UA: NEGATIVE
RBC, UA: NEGATIVE
Specific Gravity, UA: 1.015 (ref 1.005–1.030)
Urobilinogen, Ur: 0.2 mg/dL (ref 0.2–1.0)
pH, UA: 5.5 (ref 5.0–7.5)

## 2019-02-14 LAB — MICROSCOPIC EXAMINATION
BACTERIA UA: NONE SEEN
RBC, UA: NONE SEEN /hpf (ref 0–2)
WBC, UA: NONE SEEN /hpf (ref 0–5)

## 2019-02-14 LAB — BAYER DCA HB A1C WAIVED: HB A1C (BAYER DCA - WAIVED): 6.3 % (ref <7.0)

## 2019-02-14 MED ORDER — METOPROLOL TARTRATE 50 MG PO TABS: 50.0000 mg | ORAL_TABLET | Freq: Every day | ORAL | 1 refills | Status: DC

## 2019-02-14 MED ORDER — LISINOPRIL 10 MG PO TABS: 10.0000 mg | ORAL_TABLET | Freq: Every day | ORAL | 1 refills | Status: DC

## 2019-02-14 MED ORDER — METFORMIN HCL 500 MG PO TABS: ORAL_TABLET | ORAL | 1 refills | Status: DC

## 2019-02-14 MED ORDER — LOVASTATIN 20 MG PO TABS: ORAL_TABLET | ORAL | 1 refills | Status: DC

## 2019-02-14 NOTE — Assessment & Plan Note (Signed)
BP under good control. Continue current regimen. Continue to monitor. Call with any concerns. Will keep cholesterol and diabetes under good control.

## 2019-02-14 NOTE — Assessment & Plan Note (Signed)
Does not want to discuss his depression. Not interested in filling out a pHq9 or any medicine. Does not want trazodone back. Will call if he changes his mind.  

## 2019-02-14 NOTE — Assessment & Plan Note (Signed)
BP under good control. Continue current regimen. Continue to monitor. Call with any concerns. Will keep cholesterol and diabetes under good control.  

## 2019-02-14 NOTE — Progress Notes (Signed)
BP 138/79   Pulse 66   Temp 98.7 F (37.1 C) (Oral)   Ht 5' 10.87" (1.8 m)   Wt 186 lb (84.4 kg)   SpO2 95%   BMI 26.04 kg/m    Subjective:    Patient ID: Lucas Weiss, male    DOB: May 17, 1956, 63 y.o.   MRN: [id number]  HPI: Lucas Weiss is a 63 y.o. male  Chief Complaint  Patient presents with  . Health Maintenance    Colon cancer screening- ordered, Eye exam - no scheduled exam per pt, Foot exam - layed out for provider, A1C - ordered  . Hypertension  . Hyperlipidemia  . Diabetes   DIABETES Hypoglycemic episodes:no Polydipsia/polyuria: no Visual disturbance: no Chest pain: no Paresthesias: no Glucose Monitoring: no  Accucheck frequency: Not Checking Taking Insulin?: no Blood Pressure Monitoring: not checking Retinal Examination: Not up to Date Foot Exam: Done today Diabetic Education: Completed Pneumovax: Up to Date Influenza: Up to Date Aspirin: no  HYPERTENSION / HYPERLIPIDEMIA Satisfied with current treatment? yes Duration of hypertension: chronic BP monitoring frequency: not checking BP medication side effects: no Past BP meds: metoprolol, lisinopril Duration of hyperlipidemia: chronic Cholesterol medication side effects: no Cholesterol supplements: none Past cholesterol medications: lovastatin Medication compliance: good compliance Aspirin: no Recent stressors: no Recurrent headaches: no Visual changes: no Palpitations: no Dyspnea: no Chest pain: no Lower extremity edema: no Dizzy/lightheaded: no  DEPRESSION- refuses to fill out PHQ9 or discuss his depression. Did not continue his remeron or trazodone. He is not sure if they helped at all. Notes that 1 of them made him feel funny Mood status: stable Satisfied with current treatment?: yes Symptom severity: unknown   Psychotherapy/counseling: no  Depressed mood: yes Anxious mood: yes Anhedonia: no Significant weight loss or gain: no Insomnia: yes hard to fall asleep Fatigue:  yes Feelings of worthlessness or guilt: no Impaired concentration/indecisiveness: no Suicidal ideations: no Hopelessness: no Crying spells: no Depression screen [facility] 2/9 02/14/2019 07/25/2018 07/21/2017 11/16/2016 10/12/2016  Decreased Interest 0 0 0 3 3  Down, Depressed, Hopeless 0 0 0 3 3  PHQ - 2 Score 0 0 0 6 6  Altered sleeping - - - 3 3  Tired, decreased energy - - - 3 3  Change in appetite - - - - 3  Feeling bad or failure about yourself  - - - 3 3  Trouble concentrating - - - 3 3  Moving slowly or fidgety/restless - - - 3 2  Suicidal thoughts - - - 0 0  PHQ-9 Score - - - 21 23  Difficult doing work/chores - - - - -  Some recent data might be hidden    Relevant past medical, surgical, family and social history reviewed and updated as indicated. Interim medical history since our last visit reviewed. Allergies and medications reviewed and updated.  Review of Systems  Constitutional: Negative.   HENT: Negative.   Respiratory: Negative.   Cardiovascular: Negative.   Psychiatric/Behavioral: Negative.     Per HPI unless specifically indicated above     Objective:    BP 138/79   Pulse 66   Temp 98.7 F (37.1 C) (Oral)   Ht 5' 10.87" (1.8 m)   Wt 186 lb (84.4 kg)   SpO2 95%   BMI 26.04 kg/m   Wt Readings from Last 3 Encounters:  02/14/19 186 lb (84.4 kg)  07/25/18 190 lb 6.4 oz (86.4 kg)  03/10/18 195 lb 8 oz (88.7 kg)  Physical Exam Vitals signs and nursing note reviewed.  Constitutional:      General: He is not in acute distress.    Appearance: Normal appearance. He is not ill-appearing, toxic-appearing or diaphoretic.  HENT:     Head: Normocephalic and atraumatic.     Right Ear: External ear normal.     Left Ear: External ear normal.     Nose: Nose normal.     Mouth/Throat:     Mouth: Mucous membranes are moist.     Pharynx: Oropharynx is clear.  Eyes:     General: No scleral icterus.       Right eye: No discharge.        Left eye: No discharge.      Extraocular Movements: Extraocular movements intact.     Conjunctiva/sclera: Conjunctivae normal.     Pupils: Pupils are equal, round, and reactive to light.  Neck:     Musculoskeletal: Normal range of motion and neck supple.  Cardiovascular:     Rate and Rhythm: Normal rate and regular rhythm.     Pulses: Normal pulses.     Heart sounds: Normal heart sounds. No murmur. No friction rub. No gallop.   Pulmonary:     Effort: Pulmonary effort is normal. No respiratory distress.     Breath sounds: Normal breath sounds. No stridor. No wheezing, rhonchi or rales.  Chest:     Chest wall: No tenderness.  Musculoskeletal: Normal range of motion.  Skin:    General: Skin is warm and dry.     Capillary Refill: Capillary refill takes less than 2 seconds.     Coloration: Skin is not jaundiced or pale.     Findings: No bruising, erythema, lesion or rash.  Neurological:     General: No focal deficit present.     Mental Status: He is alert and oriented to person, place, and time. Mental status is at baseline.  Psychiatric:        Mood and Affect: Mood normal.        Behavior: Behavior normal.        Thought Content: Thought content normal.        Judgment: Judgment normal.     Results for orders placed or performed in visit on 02/10/18  Microscopic Examination  Result Value Ref Range   WBC, UA 0-5 0 - 5 /hpf   RBC, UA 0-2 0 - 2 /hpf   Epithelial Cells (non renal) 0-10 0 - 10 /hpf   Renal Epithel, UA 0-10 (A) None seen /hpf   Mucus, UA Present Not Estab.   Bacteria, UA None seen None seen/Few  CBC with Differential/Platelet  Result Value Ref Range   WBC 7.4 3.4 - 10.8 x10E3/uL   RBC 4.50 4.14 - 5.80 x10E6/uL   Hemoglobin 12.9 (L) 13.0 - 17.7 g/dL   Hematocrit 65.7 84.6 - 51.0 %   MCV 84 79 - 97 fL   MCH 28.7 26.6 - 33.0 pg   MCHC 34.0 31.5 - 35.7 g/dL   RDW 96.2 95.2 - 84.1 %   Platelets 213 150 - 379 x10E3/uL   Neutrophils 62 Not Estab. %   Lymphs 25 Not Estab. %   Monocytes 9 Not  Estab. %   Eos 3 Not Estab. %   Basos 1 Not Estab. %   Neutrophils Absolute 4.7 1.4 - 7.0 x10E3/uL   Lymphocytes Absolute 1.9 0.7 - 3.1 x10E3/uL   Monocytes Absolute 0.6 0.1 - 0.9 x10E3/uL   EOS (ABSOLUTE)  0.2 0.0 - 0.4 x10E3/uL   Basophils Absolute 0.1 0.0 - 0.2 x10E3/uL   Immature Granulocytes 0 Not Estab. %   Immature Grans (Abs) 0.0 0.0 - 0.1 x10E3/uL  Bayer DCA Hb A1c Waived  Result Value Ref Range   HB A1C (BAYER DCA - WAIVED) 6.5 <7.0 %  Comprehensive metabolic panel  Result Value Ref Range   Glucose 106 (H) 65 - 99 mg/dL   BUN 20 6 - 24 mg/dL   Creatinine, Ser 1.61 0.76 - 1.27 mg/dL   GFR calc non Af Amer 79 >59 mL/min/1.73   GFR calc Af Amer 91 >59 mL/min/1.73   BUN/Creatinine Ratio 19 9 - 20   Sodium 144 134 - 144 mmol/L   Potassium 4.7 3.5 - 5.2 mmol/L   Chloride 108 (H) 96 - 106 mmol/L   CO2 20 20 - 29 mmol/L   Calcium 9.2 8.7 - 10.2 mg/dL   Total Protein 7.1 6.0 - 8.5 g/dL   Albumin 4.4 3.5 - 5.5 g/dL   Globulin, Total 2.7 1.5 - 4.5 g/dL   Albumin/Globulin Ratio 1.6 1.2 - 2.2   Bilirubin Total <0.2 0.0 - 1.2 mg/dL   Alkaline Phosphatase 69 39 - 117 IU/L   AST 21 0 - 40 IU/L   ALT 29 0 - 44 IU/L  Lipid Panel w/o Chol/HDL Ratio  Result Value Ref Range   Cholesterol, Total 197 100 - 199 mg/dL   Triglycerides 096 (H) 0 - 149 mg/dL   HDL 33 (L) >04 mg/dL   VLDL Cholesterol Cal Comment 5 - 40 mg/dL   LDL Calculated Comment 0 - 99 mg/dL  Microalbumin, Urine Waived  Result Value Ref Range   Microalb, Ur Waived 150 (H) 0 - 19 mg/L   Creatinine, Urine Waived 300 10 - 300 mg/dL   Microalb/Creat Ratio 30-300 (H) <30 mg/g  PSA  Result Value Ref Range   Prostate Specific Ag, Serum 1.3 0.0 - 4.0 ng/mL  TSH  Result Value Ref Range   TSH 1.190 0.450 - 4.500 uIU/mL  UA/M w/rflx Culture, Routine  Result Value Ref Range   Specific Gravity, UA >1.030 (H) 1.005 - 1.030   pH, UA 5.5 5.0 - 7.5   Color, UA Orange Yellow   Appearance Ur Hazy (A) Clear   Leukocytes, UA  Negative Negative   Protein, UA 2+ (A) Negative/Trace   Glucose, UA Negative Negative   Ketones, UA Trace (A) Negative   RBC, UA Negative Negative   Bilirubin, UA Negative Negative   Urobilinogen, Ur 1.0 0.2 - 1.0 mg/dL   Nitrite, UA Negative Negative   Microscopic Examination See below:       Assessment & Plan:   Problem List Items Addressed This Visit      Cardiovascular and Mediastinum   CAD (coronary artery disease)    BP under good control. Continue current regimen. Continue to monitor. Call with any concerns. Will keep cholesterol and diabetes under good control.       Relevant Medications   lisinopril (PRINIVIL,ZESTRIL) 10 MG tablet   lovastatin (MEVACOR) 20 MG tablet   metoprolol tartrate (LOPRESSOR) 50 MG tablet   Other Relevant Orders   CBC with Differential/Platelet   Comprehensive metabolic panel   TSH   UA/M w/rflx Culture, Routine   Hypertensive heart failure (HCC)    BP under good control. Continue current regimen. Continue to monitor. Call with any concerns. Will keep cholesterol and diabetes under good control.       Relevant  Medications   lisinopril (PRINIVIL,ZESTRIL) 10 MG tablet   lovastatin (MEVACOR) 20 MG tablet   metoprolol tartrate (LOPRESSOR) 50 MG tablet   Other Relevant Orders   CBC with Differential/Platelet   Comprehensive metabolic panel   TSH   UA/M w/rflx Culture, Routine   Renal artery stenosis, native, bilateral (HCC)    BP under good control. Continue current regimen. Continue to monitor. Call with any concerns. Will keep cholesterol and diabetes under good control.       Relevant Medications   lisinopril (PRINIVIL,ZESTRIL) 10 MG tablet   lovastatin (MEVACOR) 20 MG tablet   metoprolol tartrate (LOPRESSOR) 50 MG tablet   Other Relevant Orders   CBC with Differential/Platelet   Comprehensive metabolic panel   TSH   UA/M w/rflx Culture, Routine   Atherosclerotic PVD with intermittent claudication (HCC)    BP under good control.  Continue current regimen. Continue to monitor. Call with any concerns. Will keep cholesterol and diabetes under good control.       Relevant Medications   lisinopril (PRINIVIL,ZESTRIL) 10 MG tablet   lovastatin (MEVACOR) 20 MG tablet   metoprolol tartrate (LOPRESSOR) 50 MG tablet   Other Relevant Orders   CBC with Differential/Platelet   Comprehensive metabolic panel   TSH   UA/M w/rflx Culture, Routine   Essential hypertension    Under good control on current regimen. Continue current regimen. Continue to monitor. Call with any concerns. Refills given. Labs checked today.       Relevant Medications   lisinopril (PRINIVIL,ZESTRIL) 10 MG tablet   lovastatin (MEVACOR) 20 MG tablet   metoprolol tartrate (LOPRESSOR) 50 MG tablet   Other Relevant Orders   CBC with Differential/Platelet   Comprehensive metabolic panel   TSH   UA/M w/rflx Culture, Routine     Endocrine   Controlled type 2 diabetes mellitus (HCC) - Primary    Doing well with A1c of 6.3- continue current regimen. Continue to monitor. Call with any concerns.       Relevant Medications   lisinopril (PRINIVIL,ZESTRIL) 10 MG tablet   lovastatin (MEVACOR) 20 MG tablet   metFORMIN (GLUCOPHAGE) 500 MG tablet   Other Relevant Orders   Bayer DCA Hb A1c Waived   Fecal occult blood, imunochemical([facility])   CBC with Differential/Platelet   Comprehensive metabolic panel   TSH   UA/M w/rflx Culture, Routine     Other   S/P CABG x 5: 2009: LIMA to LAD, SVG to Diag, Seq SVG to OM2-3, SVG to PDA    BP under good control. Continue current regimen. Continue to monitor. Call with any concerns. Will keep cholesterol and diabetes under good control.       Relevant Orders   CBC with Differential/Platelet   Comprehensive metabolic panel   TSH   UA/M w/rflx Culture, Routine   HLD (hyperlipidemia)    Rechecking levels today. Await results. Call with any concerns.       Relevant Medications   lisinopril  (PRINIVIL,ZESTRIL) 10 MG tablet   lovastatin (MEVACOR) 20 MG tablet   metoprolol tartrate (LOPRESSOR) 50 MG tablet   Other Relevant Orders   CBC with Differential/Platelet   Comprehensive metabolic panel   Lipid Panel w/o Chol/HDL Ratio   TSH   UA/M w/rflx Culture, Routine   Severe recurrent major depression (HCC)    Does not want to discuss his depression. Not interested in filling out a pHq9 or any medicine. Does not want trazodone back. Will call if he changes his mind.  Relevant Orders   CBC with Differential/Platelet   Comprehensive metabolic panel   TSH   UA/M w/rflx Culture, Routine   Chronic pain    Stable. Known arthritis. Offered referral to pain management. He declined.       Relevant Orders   CBC with Differential/Platelet   Comprehensive metabolic panel   TSH   UA/M w/rflx Culture, Routine   Insomnia    Does not want to discuss his depression. Not interested in filling out a pHq9 or any medicine. Does not want trazodone back. Will call if he changes his mind.       RESOLVED: Depression   Relevant Orders   CBC with Differential/Platelet   Comprehensive metabolic panel   TSH   UA/M w/rflx Culture, Routine   CBC with Differential/Platelet   Comprehensive metabolic panel   TSH   UA/M w/rflx Culture, Routine    Other Visit Diagnoses    Colon cancer screening       Will do FOBT- ordered today. Will return.    Relevant Orders   Ambulatory referral to Ophthalmology   Hesitancy       Checking PSA today. Await results.    Relevant Orders   PSA       Follow up plan: Return in about 6 months (around 08/15/2019) for 6 month follow up and wellness.

## 2019-02-14 NOTE — Assessment & Plan Note (Signed)
Rechecking levels today. Await results. Call with any concerns.  

## 2019-02-14 NOTE — Assessment & Plan Note (Signed)
Does not want to discuss his depression. Not interested in filling out a pHq9 or any medicine. Does not want trazodone back. Will call if he changes his mind.

## 2019-02-14 NOTE — Assessment & Plan Note (Signed)
Doing well with A1c of 6.3- continue current regimen. Continue to monitor. Call with any concerns.

## 2019-02-14 NOTE — Assessment & Plan Note (Signed)
Stable. Known arthritis. Offered referral to pain management. He declined.

## 2019-02-14 NOTE — Assessment & Plan Note (Signed)
Under good control on current regimen. Continue current regimen. Continue to monitor. Call with any concerns. Refills given. Labs checked today.  

## 2019-02-15 LAB — CBC WITH DIFFERENTIAL/PLATELET
Basophils Absolute: 0.1 10*3/uL (ref 0.0–0.2)
Basos: 1 %
EOS (ABSOLUTE): 0.3 10*3/uL (ref 0.0–0.4)
Eos: 4 %
HEMOGLOBIN: 14.1 g/dL (ref 13.0–17.7)
Hematocrit: 42.6 % (ref 37.5–51.0)
Immature Grans (Abs): 0 10*3/uL (ref 0.0–0.1)
Immature Granulocytes: 0 %
LYMPHS ABS: 1.7 10*3/uL (ref 0.7–3.1)
Lymphs: 22 %
MCH: 28.3 pg (ref 26.6–33.0)
MCHC: 33.1 g/dL (ref 31.5–35.7)
MCV: 85 fL (ref 79–97)
Monocytes Absolute: 0.5 10*3/uL (ref 0.1–0.9)
Monocytes: 6 %
Neutrophils Absolute: 5.2 10*3/uL (ref 1.4–7.0)
Neutrophils: 67 %
Platelets: 255 10*3/uL (ref 150–450)
RBC: 4.99 x10E6/uL (ref 4.14–5.80)
RDW: 14.3 % (ref 11.6–15.4)
WBC: 7.9 10*3/uL (ref 3.4–10.8)

## 2019-02-15 LAB — LIPID PANEL W/O CHOL/HDL RATIO
CHOLESTEROL TOTAL: 225 mg/dL — AB (ref 100–199)
HDL: 40 mg/dL (ref >39)
LDL CALC: 128 mg/dL — AB (ref 0–99)
Triglycerides: 287 mg/dL — ABNORMAL HIGH (ref 0–149)
VLDL Cholesterol Cal: 57 mg/dL — ABNORMAL HIGH (ref 5–40)

## 2019-02-15 LAB — COMPREHENSIVE METABOLIC PANEL
ALT: 29 IU/L (ref 0–44)
AST: 23 IU/L (ref 0–40)
Albumin/Globulin Ratio: 1.8 (ref 1.2–2.2)
Albumin: 4.6 g/dL (ref 3.8–4.9)
Alkaline Phosphatase: 81 IU/L (ref 39–117)
BUN/Creatinine Ratio: 13 (ref 10–24)
BUN: 15 mg/dL (ref 8–27)
Bilirubin Total: 0.3 mg/dL (ref 0.0–1.2)
CO2: 18 mmol/L — ABNORMAL LOW (ref 20–29)
Calcium: 9.7 mg/dL (ref 8.6–10.2)
Chloride: 105 mmol/L (ref 96–106)
Creatinine, Ser: 1.19 mg/dL (ref 0.76–1.27)
GFR calc Af Amer: 76 mL/min/{1.73_m2} (ref >59)
GFR calc non Af Amer: 66 mL/min/{1.73_m2} (ref >59)
Globulin, Total: 2.5 g/dL (ref 1.5–4.5)
Glucose: 135 mg/dL — ABNORMAL HIGH (ref 65–99)
Potassium: 4.7 mmol/L (ref 3.5–5.2)
Sodium: 138 mmol/L (ref 134–144)
Total Protein: 7.1 g/dL (ref 6.0–8.5)

## 2019-02-15 LAB — TSH: TSH: 1.43 u[IU]/mL (ref 0.450–4.500)

## 2019-02-15 LAB — PSA: Prostate Specific Ag, Serum: 1 ng/mL (ref 0.0–4.0)

## 2019-02-16 ENCOUNTER — Telehealth: Payer: Self-pay | Admitting: Family Medicine

## 2019-02-16 NOTE — Telephone Encounter (Signed)
Message relayed to patient. Verbalized understanding and denied questions.   

## 2019-02-16 NOTE — Telephone Encounter (Signed)
Please let him know that all his labs came back nice and normal. Thanks! 

## 2019-05-16 ENCOUNTER — Telehealth: Payer: Self-pay | Admitting: Family Medicine

## 2019-05-16 DIAGNOSIS — E782 Mixed hyperlipidemia: Secondary | ICD-10-CM

## 2019-05-16 DIAGNOSIS — I110 Hypertensive heart disease with heart failure: Secondary | ICD-10-CM

## 2019-05-16 NOTE — Telephone Encounter (Signed)
Copied from CRM [id number]. Topic: Quick Communication - Rx Refill/Question >> May 16, 2019  9:15 AM Gwenlyn Fudge A wrote: Medication: lisinopril (PRINIVIL,ZESTRIL) 10 MG tablet, lovastatin (MEVACOR) 20 MG tablet, metFORMIN (GLUCOPHAGE) 500 MG tablet , metoprolol tartrate (LOPRESSOR) 50 MG tablet   Has the patient contacted their pharmacy? No. PT states he will be out of medication tomorrow. Please advise.  (Agent: If no, request that the patient contact the pharmacy for the refill.) (Agent: If yes, when and what did the pharmacy advise?)  Preferred Pharmacy (with phone number or street name): [facility] [address] - [place], [place] - [address] [place] [address] Phone: [phone] Fax: [phone] Not a 24 hour pharmacy; exact hours not known.    Agent: Please be advised that RX refills may take up to 3 business days. We ask that you follow-up with your pharmacy.

## 2019-05-17 MED ORDER — LISINOPRIL 10 MG PO TABS: 10.0000 mg | ORAL_TABLET | Freq: Every day | ORAL | 0 refills | Status: DC

## 2019-05-17 MED ORDER — METOPROLOL TARTRATE 50 MG PO TABS: 50.0000 mg | ORAL_TABLET | Freq: Every day | ORAL | 0 refills | Status: DC

## 2019-05-17 MED ORDER — METFORMIN HCL 500 MG PO TABS: ORAL_TABLET | ORAL | 0 refills | Status: DC

## 2019-05-17 MED ORDER — LOVASTATIN 20 MG PO TABS: ORAL_TABLET | ORAL | 0 refills | Status: DC

## 2019-07-31 ENCOUNTER — Ambulatory Visit (INDEPENDENT_AMBULATORY_CARE_PROVIDER_SITE_OTHER): Payer: Medicare Other

## 2019-07-31 DIAGNOSIS — Z0000 Encounter for general adult medical examination without abnormal findings: Secondary | ICD-10-CM

## 2019-07-31 NOTE — Progress Notes (Signed)
Subjective:   Mcarthur RossettiSteven M State is a 63 y.o. male who presents for Medicare Annual/Subsequent preventive examination.  This visit is being conducted via phone call  - after an attmept to do on video chat - due to the COVID-19 pandemic. This patient has given me verbal consent via phone to conduct this visit, patient states they are participating from their home address. Some vital signs may be absent or patient reported.   Patient identification: identified by name, DOB, and current address.    Review of Systems:   Cardiac Risk Factors include: male gender;hypertension;advanced age (>5955men, 105>65 women);diabetes mellitus;dyslipidemia     Objective:    Vitals: There were no vitals taken for this visit.  There is no height or weight on file to calculate BMI.  Advanced Directives 07/31/2019 07/25/2018 07/21/2017 05/12/2016 11/11/2015 09/15/2012 09/15/2012  Does Patient Have a Medical Advance Directive? No No No No No Patient does not have advance directive;Patient would like information -  Would patient like information on creating a medical advance directive? - Yes (MAU/Ambulatory/Procedural Areas - Information given) Yes (MAU/Ambulatory/Procedural Areas - Information given) Yes - Educational materials given - Referral made to social work -  Pre-existing out of facility DNR order (yellow form or pink MOST form) - - - - - - No    Tobacco Social History   Tobacco Use  Smoking Status Former Smoker  . Packs/day: 1.50  . Years: 30.00  . Pack years: 45.00  . Types: Cigarettes  . Quit date: 07/19/2008  . Years since quitting: 11.0  Smokeless Tobacco Never Used     Counseling given: Not Answered   Clinical Intake:  Pre-visit preparation completed: Yes  Pain : 0-10 Pain Score: 6  Pain Type: Chronic pain Pain Location: Back Pain Orientation: Mid Pain Descriptors / Indicators: Aching Pain Onset: More than a month ago Pain Frequency: Constant     Nutritional Risks: None Diabetes: Yes  CBG done?: No Did pt. bring in CBG monitor from home?: No  How often do you need to have someone help you when you read instructions, pamphlets, or other written materials from your doctor or pharmacy?: 1 - Never  Nutrition Risk Assessment:  Has the patient had any N/V/D within the last 2 months?  No  Does the patient have any non-healing wounds?  No  Has the patient had any unintentional weight loss or weight gain?  No   Diabetes:  Is the patient diabetic?  Yes  If diabetic, was a CBG obtained today?  No  Did the patient bring in their glucometer from home?  No  How often do you monitor your CBG's? Doesn't check at home.   Financial Strains and Diabetes Management:  Are you having any financial strains with the device, your supplies or your medication? No .  Does the patient want to be seen by Chronic Care Management for management of their diabetes?  No  Would the patient like to be referred to a Nutritionist or for Diabetic Management?  No   Diabetic Exams:  Diabetic Eye Exam: Overdue for diabetic eye exam. Pt has been advised about the importance in completing this exam. A referral has been placed  Diabetic Foot Exam: Completed 02/14/2019.  Interpreter Needed?: No  Information entered by :: Tiffany Hill,LPN  Past Medical History:  Diagnosis Date  . Alcohol abuse, in remission   . Avascular necrosis of bone of left hip (HCC) 10/15   Total hip replacement recommended by ortho, vascular says it's OK  .  Avascular necrosis of bone of left hip (HCC)    Total hip replacement recommended by ortho, vascular says he can have it done.    . Bleeding hemorrhoids   . CHF (congestive heart failure) (HCC)   . Chronic low back pain   . Complication of anesthesia    "woke up twice during procedures @ [place]" (09/15/2012)  . Coronary artery disease   . Diabetes mellitus without complication (HCC) 01/09/16   A1c 6.8  . Diverticulosis of sigmoid colon   . DVT (deep venous thrombosis)  (HCC)   . Dyslexia   . H/O hiatal hernia   . Head injury, acute, with loss of consciousness (HCC)    as a child  . High cholesterol   . Hypertension   . Impaired fasting glucose    Last A1c 6.1  . Myocardial infarction (HCC)   . Neuropathy   . PAD (peripheral artery disease) (HCC)    07/2012:  right ABI 0.64, left ABI 0.57.  Marland Kitchen Pneumonia    history of   . Primary osteoarthritis of left hip   . Renal artery stenosis (HCC)    Bilateral   . Severe recurrent major depression without psychotic features (HCC)   . Shortness of breath dyspnea    with exertion - due to pain  . Stroke [facility]) 2009   "after CABG; ~ blind left eye since" (09/15/2012)- memory loss   . Tobacco abuse    Quit in 2009   Past Surgical History:  Procedure Laterality Date  . 2D Echo  02/17/11   EF 50-55%, mild LVH, No WMA, mild MR, mild TR  . CARDIAC CATHETERIZATION  ?2009  . CARDIOVASCULAR STRESS TEST  08/10/12   Lexiscan: EF 70%, Attenuation artifact in inferior region.  No ischemia or infarct in remaining myocardium. Low risk  . COLONOSCOPY W/ POLYPECTOMY     "no cancer"  . CORONARY ARTERY BYPASS GRAFT  2009   2009, CABG X5, LIMA to LAD, SVG to Diagnonal, Sequential SVG to OM 2-3, SVG to PDA; Dr. Laneta Simmers  . INSERTION OF ILIAC STENT Left 09/15/2012   Procedure: INSERTION OF ILIAC STENT;  Surgeon: [name], MD;  Location: [facility] CATH LAB;  Service: Cardiovascular;  Laterality: Left;  lt iliac stent  . LOWER EXTREMITY ANGIOGRAM N/A 09/15/2012   Procedure: LOWER EXTREMITY ANGIOGRAM;  Surgeon: [name], MD;  Location: [facility] CATH LAB;  Service: Cardiovascular;  Laterality: N/A;  . PERIPHERAL ARTERIAL STENT GRAFT Left 06/20/2012; ~ 07/2012; 09/15/2012   left; left; left  . PV angiogram  09/15/12   restenting of left common iliac for ISRS.  Bilateral occluded SFAs.  Renal artery stenosis: 70% right inferior renal artery, 90% left inferior renal artery stenosis.   Marland Kitchen TOTAL HIP ARTHROPLASTY Left 05/19/2016   Procedure:  LEFT TOTAL HIP ARTHROPLASTY ANTERIOR APPROACH;  Surgeon: Kathryne Hitch, MD;  Location: [facility] OR;  Service: Orthopedics;  Laterality: Left;   Family History  Problem Relation Age of Onset  . Diabetes Mother   . Heart disease Mother   . Hyperlipidemia Mother   . Hypertension Mother   . Heart attack Mother   . Thyroid disease Mother   . Hyperlipidemia Father   . Hypertension Father   . Heart attack Father   . Heart disease Father        before age 86  . Alcohol abuse Father   . Lung disease Father   . Diabetes Sister   . Hyperlipidemia Sister   .  Hypertension Sister   . Cancer Sister   . Heart disease Brother        before age 61  . Hyperlipidemia Brother   . Hypertension Brother   . Heart attack Brother   . Diabetes Brother   . Heart disease Brother   . Hyperlipidemia Brother   . Hypertension Brother   . Stroke Brother   . Heart attack Brother   . Heart disease Sister   . Hypertension Sister   . Hyperlipidemia Sister    Social History   Socioeconomic History  . Marital status: Divorced    Spouse name: Not on file  . Number of children: Not on file  . Years of education: Not on file  . Highest education level: 11th grade  Occupational History  . Occupation: disability  Social Needs  . Financial resource strain: Not hard at all  . Food insecurity    Worry: Never true    Inability: Never true  . Transportation needs    Medical: No    Non-medical: No  Tobacco Use  . Smoking status: Former Smoker    Packs/day: 1.50    Years: 30.00    Pack years: 45.00    Types: Cigarettes    Quit date: 07/19/2008    Years since quitting: 11.0  . Smokeless tobacco: Never Used  Substance and Sexual Activity  . Alcohol use: No    Comment: Former Alcoholic, not drinking anymore. 09/15/2012 "3-4 beers; shot of crown @ least 2 nights/wk"  . Drug use: No  . Sexual activity: Not Currently  Lifestyle  . Physical activity    Days per week: 0 days    Minutes per session: 0 min   . Stress: Not at all  Relationships  . Social connections    Talks on phone: More than three times a week    Gets together: More than three times a week    Attends religious service: More than 4 times per year    Active member of club or organization: No    Attends meetings of clubs or organizations: Never    Relationship status: Divorced  Other Topics Concern  . Not on file  Social History Narrative  . Not on file    Outpatient Encounter Medications as of 07/31/2019  Medication Sig  . lisinopril (ZESTRIL) 10 MG tablet Take 1 tablet (10 mg total) by mouth daily.  Marland Kitchen. lovastatin (MEVACOR) 20 MG tablet TAKE TWO TABLETS BY MOUTH EVERY NIGHT AT BEDTIME.  . metFORMIN (GLUCOPHAGE) 500 MG tablet TAKE TWO TABLETS BY MOUTH TWICE A DAY WITH FOOD.  Marland Kitchen. metoprolol tartrate (LOPRESSOR) 50 MG tablet Take 1 tablet (50 mg total) by mouth daily.  . nitroGLYCERIN (NITROSTAT) 0.4 MG SL tablet Place 1 tablet (0.4 mg total) under the tongue every 5 (five) minutes as needed for chest pain. (Patient not taking: Reported on 07/31/2019)  . [DISCONTINUED] naproxen (NAPROSYN) 500 MG tablet Take 1 tablet (500 mg total) by mouth 2 (two) times daily with a meal. (Patient not taking: Reported on 07/31/2019)   No facility-administered encounter medications on file as of 07/31/2019.     Activities of Daily Living In your present state of health, do you have any difficulty performing the following activities: 07/31/2019  Hearing? N  Comment hearing aids  Vision? Y  Comment blind in left eye, reading glasses  Difficulty concentrating or making decisions? N  Walking or climbing stairs? Y  Dressing or bathing? N  Doing errands, shopping? N  Preparing  Food and eating ? N  Using the Toilet? N  In the past six months, have you accidently leaked urine? N  Do you have problems with loss of bowel control? N  Managing your Medications? N  Managing your Finances? N  Housekeeping or managing your Housekeeping? N  Some recent  data might be hidden    Patient Care Team: [name], DO as PCP - General (Family Medicine) [name], MD as Consulting Physician (Orthopedic Surgery)   Assessment:   This is a routine wellness examination for Lucas Weiss.  Exercise Activities and Dietary recommendations Current Exercise Habits: The patient does not participate in regular exercise at present, Exercise limited by: orthopedic condition(s)  Goals    . DIET - INCREASE WATER INTAKE     Recommend drinking at least 6-8 glasses of water a day     . Increase water intake     Recommend drinking at least 4-5 glasses of water a day        Fall Risk Fall Risk  07/31/2019 02/14/2019 07/25/2018 07/21/2017 05/06/2017  Falls in the past year? 0 0 No No No  Number falls in past yr: - - - - -  Injury with Fall? - - - - -  Risk for fall due to : - - - - -  Follow up - Falls evaluation completed - - -   FALL RISK PREVENTION PERTAINING TO THE HOME:  Any stairs in or around the home? No  If so, are there any without handrails? No   Home free of loose throw rugs in walkways, pet beds, electrical cords, etc? Yes  Adequate lighting in your home to reduce risk of falls? Yes   ASSISTIVE DEVICES UTILIZED TO PREVENT FALLS:  Life alert? No  Use of a cane, walker or w/c? No  Grab bars in the bathroom? No  Shower chair or bench in shower? No  Elevated toilet seat or a handicapped toilet? No    TIMED UP AND GO:  Unable to perform   Depression Screen PHQ 2/9 Scores 07/31/2019 02/14/2019 07/25/2018 08/09/2017  PHQ - 2 Score 0 0 0 -  PHQ- 9 Score - - - -  Exception Documentation - Patient refusal - Patient refusal    Cognitive Function     6CIT Screen 07/25/2018 07/21/2017  What Year? 0 points 0 points  What month? 0 points 0 points  What time? 0 points 0 points  Count back from 20 0 points 0 points  Months in reverse 0 points 0 points  Repeat phrase 6 points 2 points  Total Score 6 2    Immunization History   Administered Date(s) Administered  . Influenza Split 09/16/2012  . Influenza,inj,Quad PF,6+ Mos 09/06/2015, 09/14/2016, 09/09/2017, 10/17/2018  . Influenza-Unspecified 09/12/2014  . Pneumococcal Polysaccharide-23 05/28/2010, 05/20/2016  . Tdap 05/28/2010    Qualifies for Shingles Vaccine? Yes  Zostavax completed n/a. Due for Shingrix. Education has been provided regarding the importance of this vaccine. Pt has been advised to call insurance company to determine out of pocket expense. Advised may also receive vaccine at local pharmacy or Health Dept. Verbalized acceptance and understanding.  Tdap: up to date   Flu Vaccine: Due 08/2019  Pneumococcal Vaccine: up to date   Screening Tests Health Maintenance  Topic Date Due  . OPHTHALMOLOGY EXAM  03/05/1966  . COLON CANCER SCREENING ANNUAL FOBT  02/05/2017  . INFLUENZA VACCINE  07/22/2019  . COLONOSCOPY  02/05/2026 (Originally 02/05/2017)  . HEMOGLOBIN A1C  08/15/2019  . FOOT EXAM  02/15/2020  . TETANUS/TDAP  05/28/2020  . PNEUMOCOCCAL POLYSACCHARIDE VACCINE AGE 51-64 HIGH RISK  Completed  . Hepatitis C Screening  Completed  . HIV Screening  Completed   Cancer Screenings:  Colorectal Screening: declined colonoscopy and has cologuard at home.   Lung Cancer Screening: (Low Dose CT Chest recommended if Age 9-80 years, 30 pack-year currently smoking OR have quit w/in 15years.) does qualify.  Declined   Additional Screening:  Hepatitis C Screening: does qualify; Completed 10/08/2015  Dental Screening: Recommended annual dental exams for proper oral hygiene  Community Resource Referral:  CRR required this visit?  No        Plan:  I have personally reviewed and addressed the Medicare Annual Wellness questionnaire and have noted the following in the patient's chart:  A. Medical and social history B. Use of alcohol, tobacco or illicit drugs  C. Current medications and supplements D. Functional ability and status E.  Nutritional  status F.  Physical activity G. Advance directives H. List of other physicians I.  Hospitalizations, surgeries, and ER visits in previous 12 months J.  Vitals K. Screenings such as hearing and vision if needed, cognitive and depression L. Referrals and appointments   In addition, I have reviewed and discussed with patient certain preventive protocols, quality metrics, and best practice recommendations. A written personalized care plan for preventive services as well as general preventive health recommendations were provided to patient.   Signed,   Collene SchlichterHill, Tiffany A, LPN  1/61/09608/09/2019 Nurse Health Advisor  Nurse Notes: none

## 2019-07-31 NOTE — Patient Instructions (Signed)
[name] , Thank you for taking time to come for your Medicare Wellness Visit. I appreciate your ongoing commitment to your health goals. Please review the following plan we discussed and let me know if I can assist you in the future.   Screening recommendations/referrals: Colonoscopy: cologuard box at home- please let us know when you are ready to complete  Recommended yearly ophthalmology/optometry visit for glaucoma screening and checkup Recommended yearly dental visit for hygiene and checkup  Vaccinations: Influenza vaccine: due 08/2019 Pneumococcal vaccine: up to date Tdap vaccine: up to date Shingles vaccine: shingrix eligible    Advanced directives: please pick up a copy of this information next time you are in the office   Conditions/risks identified: diabetic - discussed chronic care management team   Next appointment: Follow up in one year for your annual wellness visit  Preventive Care 40-64 Years, Male Preventive care refers to lifestyle choices and visits with your health care provider that can promote health and wellness. What does preventive care include?  A yearly physical exam. This is also called an annual well check.  Dental exams once or twice a year.  Routine eye exams. Ask your health care provider how often you should have your eyes checked.  Personal lifestyle choices, including:  Daily care of your teeth and gums.  Regular physical activity.  Eating a healthy diet.  Avoiding tobacco and drug use.  Limiting alcohol use.  Practicing safe sex.  Taking low-dose aspirin every day starting at age 61. What happens during an annual well check? The services and screenings done by your health care provider during your annual well check will depend on your age, overall health, lifestyle risk factors, and family history of disease. Counseling  Your health care provider may ask you questions about your:  Alcohol use.  Tobacco use.  Drug use.   Emotional well-being.  Home and relationship well-being.  Sexual activity.  Eating habits.  Work and work Astronomerenvironment. Screening  You may have the following tests or measurements:  Height, weight, and BMI.  Blood pressure.  Lipid and cholesterol levels. These may be checked every 5 years, or more frequently if you are over 61 years old.  Skin check.  Lung cancer screening. You may have this screening every year starting at age 61 if you have a 30-pack-year history of smoking and currently smoke or have quit within the past 15 years.  Fecal occult blood test (FOBT) of the stool. You may have this test every year starting at age 61.  Flexible sigmoidoscopy or colonoscopy. You may have a sigmoidoscopy every 5 years or a colonoscopy every 10 years starting at age 61.  Prostate cancer screening. Recommendations will vary depending on your family history and other risks.  Hepatitis C blood test.  Hepatitis B blood test.  Sexually transmitted disease (STD) testing.  Diabetes screening. This is done by checking your blood sugar (glucose) after you have not eaten for a while (fasting). You may have this done every 1-3 years. Discuss your test results, treatment options, and if necessary, the need for more tests with your health care provider. Vaccines  Your health care provider may recommend certain vaccines, such as:  Influenza vaccine. This is recommended every year.  Tetanus, diphtheria, and acellular pertussis (Tdap, Td) vaccine. You may need a Td booster every 10 years.  Zoster vaccine. You may need this after age 560.  Pneumococcal 13-valent conjugate (PCV13) vaccine. You may need this if you have certain conditions and  have not been vaccinated.  Pneumococcal polysaccharide (PPSV23) vaccine. You may need one or two doses if you smoke cigarettes or if you have certain conditions. Talk to your health care provider about which screenings and vaccines you need and how often  you need them. This information is not intended to replace advice given to you by your health care provider. Make sure you discuss any questions you have with your health care provider. Document Released: 01/03/2016 Document Revised: 08/26/2016 Document Reviewed: 10/08/2015 Elsevier Interactive Patient Education  2017 [employer]  Fall Prevention in the Home Falls can cause injuries. They can happen to people of all ages. There are many things you can do to make your home safe and to help prevent falls. What can I do on the outside of my home?  Regularly fix the edges of walkways and driveways and fix any cracks.  Remove anything that might make you trip as you walk through a door, such as a raised step or threshold.  Trim any bushes or trees on the path to your home.  Use bright outdoor lighting.  Clear any walking paths of anything that might make someone trip, such as rocks or tools.  Regularly check to see if handrails are loose or broken. Make sure that both sides of any steps have handrails.  Any raised decks and porches should have guardrails on the edges.  Have any leaves, snow, or ice cleared regularly.  Use sand or salt on walking paths during winter.  Clean up any spills in your garage right away. This includes oil or grease spills. What can I do in the bathroom?  Use night lights.  Install grab bars by the toilet and in the tub and shower. Do not use towel bars as grab bars.  Use non-skid mats or decals in the tub or shower.  If you need to sit down in the shower, use a plastic, non-slip stool.  Keep the floor dry. Clean up any water that spills on the floor as soon as it happens.  Remove soap buildup in the tub or shower regularly.  Attach bath mats securely with double-sided non-slip rug tape.  Do not have throw rugs and other things on the floor that can make you trip. What can I do in the bedroom?  Use night lights.  Make sure that you have a light  by your bed that is easy to reach.  Do not use any sheets or blankets that are too big for your bed. They should not hang down onto the floor.  Have a firm chair that has side arms. You can use this for support while you get dressed.  Do not have throw rugs and other things on the floor that can make you trip. What can I do in the kitchen?  Clean up any spills right away.  Avoid walking on wet floors.  Keep items that you use a lot in easy-to-reach places.  If you need to reach something above you, use a strong step stool that has a grab bar.  Keep electrical cords out of the way.  Do not use floor polish or wax that makes floors slippery. If you must use wax, use non-skid floor wax.  Do not have throw rugs and other things on the floor that can make you trip. What can I do with my stairs?  Do not leave any items on the stairs.  Make sure that there are handrails on both sides of the stairs and  use them. Fix handrails that are broken or loose. Make sure that handrails are as long as the stairways.  Check any carpeting to make sure that it is firmly attached to the stairs. Fix any carpet that is loose or worn.  Avoid having throw rugs at the top or bottom of the stairs. If you do have throw rugs, attach them to the floor with carpet tape.  Make sure that you have a light switch at the top of the stairs and the bottom of the stairs. If you do not have them, ask someone to add them for you. What else can I do to help prevent falls?  Wear shoes that:  Do not have high heels.  Have rubber bottoms.  Are comfortable and fit you well.  Are closed at the toe. Do not wear sandals.  If you use a stepladder:  Make sure that it is fully opened. Do not climb a closed stepladder.  Make sure that both sides of the stepladder are locked into place.  Ask someone to hold it for you, if possible.  Clearly mark and make sure that you can see:  Any grab bars or handrails.  First  and last steps.  Where the edge of each step is.  Use tools that help you move around (mobility aids) if they are needed. These include:  Canes.  Walkers.  Scooters.  Crutches.  Turn on the lights when you go into a dark area. Replace any light bulbs as soon as they burn out.  Set up your furniture so you have a clear path. Avoid moving your furniture around.  If any of your floors are uneven, fix them.  If there are any pets around you, be aware of where they are.  Review your medicines with your doctor. Some medicines can make you feel dizzy. This can increase your chance of falling. Ask your doctor what other things that you can do to help prevent falls. This information is not intended to replace advice given to you by your health care provider. Make sure you discuss any questions you have with your health care provider. Document Released: 10/03/2009 Document Revised: 05/14/2016 Document Reviewed: 01/11/2015 Elsevier Interactive Patient Education  2017 [employer].

## 2019-08-15 ENCOUNTER — Ambulatory Visit: Payer: Medicare Other | Admitting: Family Medicine

## 2019-08-17 ENCOUNTER — Other Ambulatory Visit: Payer: Self-pay | Admitting: Family Medicine

## 2019-08-17 DIAGNOSIS — E782 Mixed hyperlipidemia: Secondary | ICD-10-CM

## 2019-08-17 DIAGNOSIS — I110 Hypertensive heart disease with heart failure: Secondary | ICD-10-CM

## 2019-08-17 MED ORDER — METFORMIN HCL 500 MG PO TABS: ORAL_TABLET | ORAL | 0 refills | Status: DC

## 2019-08-17 MED ORDER — LISINOPRIL 10 MG PO TABS: 10.0000 mg | ORAL_TABLET | Freq: Every day | ORAL | 0 refills | Status: DC

## 2019-08-17 MED ORDER — METOPROLOL TARTRATE 50 MG PO TABS: 50.0000 mg | ORAL_TABLET | Freq: Every day | ORAL | 0 refills | Status: DC

## 2019-08-17 MED ORDER — LOVASTATIN 20 MG PO TABS: ORAL_TABLET | ORAL | 0 refills | Status: DC

## 2019-08-17 NOTE — Telephone Encounter (Signed)
Patient has upcoming appointment 09/04/19.

## 2019-08-17 NOTE — Telephone Encounter (Signed)
Medication Refill - Medication:  lisinopril (ZESTRIL) 10 MG tablet lovastatin (MEVACOR) 20 MG tablet metFORMIN (GLUCOPHAGE) 500 MG tablet metoprolol tartrate (LOPRESSOR) 50 MG tablet  Has the patient contacted their pharmacy? Yes advised to call office  Preferred Pharmacy (with phone number or street name):  [facility], [place] [phone] (Phone) [phone] (Fax)   Agent: Please be advised that RX refills may take up to 3 business days. We ask that you follow-up with your pharmacy.

## 2019-08-17 NOTE — Telephone Encounter (Signed)
Requested medication (s) are due for refill today: yes  Requested medication (s) are on the active medication list: yes  Last refill:  05/17/2019  Future visit scheduled: yes  Notes to clinic:  Review for refill   Requested Prescriptions  Pending Prescriptions Disp Refills   lisinopril (ZESTRIL) 10 MG tablet 90 tablet 0    Sig: Take 1 tablet (10 mg total) by mouth daily.     Cardiovascular:  ACE Inhibitors Failed - 08/17/2019  8:51 AM      Failed - Cr in normal range and within 180 days    Creatinine  Date Value Ref Range Status  06/02/2012 1.10 0.60 - 1.30 mg/dL Final   Creatinine, Ser  Date Value Ref Range Status  02/14/2019 1.19 0.76 - 1.27 mg/dL Final         Failed - K in normal range and within 180 days    Potassium  Date Value Ref Range Status  02/14/2019 4.7 3.5 - 5.2 mmol/L Final  06/02/2012 4.5 3.5 - 5.1 mmol/L Final         Passed - Patient is not pregnant      Passed - Last BP in normal range    BP Readings from Last 1 Encounters:  02/14/19 138/79         Passed - Valid encounter within last 6 months    Recent Outpatient Visits          6 months ago Controlled type 2 diabetes mellitus with hyperglycemia, without long-term current use of insulin (HCC)   [facility] Johnson, Megan P, DO   1 year ago Insomnia, unspecified type   [facility] Johnson, Megan P, DO   1 year ago Coronary artery disease involving native coronary artery of native heart with angina pectoris (HCC)   [facility] Johnson, Megan P, DO   1 year ago Uncontrolled type 2 diabetes mellitus with hyperglycemia (HCC)   [facility] Johnson, Megan P, DO   1 year ago Hypertensive heart failure (HCC)   [facility] Johnson, Megan P, DO      Future Appointments            In 2 weeks Johnson, Megan P, DO [facility]            lovastatin (MEVACOR) 20 MG tablet 180 tablet 0    Sig: TAKE TWO TABLETS BY MOUTH  EVERY NIGHT AT BEDTIME.     Cardiovascular:  Antilipid - Statins Failed - 08/17/2019  8:51 AM      Failed - Total Cholesterol in normal range and within 360 days    Cholesterol, Total  Date Value Ref Range Status  02/14/2019 225 (H) 100 - 199 mg/dL Final         Failed - LDL in normal range and within 360 days    LDL Calculated  Date Value Ref Range Status  02/14/2019 128 (H) 0 - 99 mg/dL Final         Failed - Triglycerides in normal range and within 360 days    Triglycerides  Date Value Ref Range Status  02/14/2019 287 (H) 0 - 149 mg/dL Final         Passed - HDL in normal range and within 360 days    HDL  Date Value Ref Range Status  02/14/2019 40 >39 mg/dL Final         Passed - Patient is not pregnant        Passed - Valid encounter within last 12 months    Recent Outpatient Visits          6 months ago Controlled type 2 diabetes mellitus with hyperglycemia, without long-term current use of insulin (HCC)   [facility] Johnson, Megan P, DO   1 year ago Insomnia, unspecified type   [facility] Johnson, Megan P, DO   1 year ago Coronary artery disease involving native coronary artery of native heart with angina pectoris (HCC)   [facility] Johnson, Megan P, DO   1 year ago Uncontrolled type 2 diabetes mellitus with hyperglycemia (HCC)   [facility] Johnson, Megan P, DO   1 year ago Hypertensive heart failure (HCC)   [facility] Johnson, Megan P, DO      Future Appointments            In 2 weeks Johnson, Megan P, DO [facility]            metFORMIN (GLUCOPHAGE) 500 MG tablet 360 tablet 0    Sig: TAKE TWO TABLETS BY MOUTH TWICE A DAY WITH FOOD.     Endocrinology:  Diabetes - Biguanides Failed - 08/17/2019  8:51 AM      Failed - HBA1C is between 0 and 7.9 and within 180 days    HB A1C (BAYER DCA - WAIVED)  Date Value Ref Range Status  02/14/2019 6.3 <7.0 % Final    Comment:                                           Diabetic Adult            <7.0                                       Healthy Adult        4.3 - 5.7                                                           (DCCT/NGSP) American Diabetes Association's Summary of Glycemic Recommendations for Adults with Diabetes: Hemoglobin A1c <7.0%. More stringent glycemic goals (A1c <6.0%) may further reduce complications at the cost of increased risk of hypoglycemia.          Passed - Cr in normal range and within 360 days    Creatinine  Date Value Ref Range Status  06/02/2012 1.10 0.60 - 1.30 mg/dL Final   Creatinine, Ser  Date Value Ref Range Status  02/14/2019 1.19 0.76 - 1.27 mg/dL Final         Passed - eGFR in normal range and within 360 days    EGFR (African American)  Date Value Ref Range Status  06/02/2012 >60  Final   GFR calc Af Amer  Date Value Ref Range Status  02/14/2019 76 >59 mL/min/1.73 Final   EGFR (Non-African Amer.)  Date Value Ref Range Status  06/02/2012 >60  Final    Comment:    eGFR values <60mL/min/1.73 m2 may be an indication of chronic kidney disease (CKD). Calculated eGFR is useful in patients   with stable renal function. The eGFR calculation will not be reliable in acutely ill patients when serum creatinine is changing rapidly. It is not useful in  patients on dialysis. The eGFR calculation may not be applicable to patients at the low and high extremes of body sizes, pregnant women, and vegetarians.    GFR calc non Af Amer  Date Value Ref Range Status  02/14/2019 66 >59 mL/min/1.73 Final         Passed - Valid encounter within last 6 months    Recent Outpatient Visits          6 months ago Controlled type 2 diabetes mellitus with hyperglycemia, without long-term current use of insulin ([place])   [place], Megan P, DO   1 year ago Insomnia, unspecified type   [place], Megan P, DO   1 year ago Coronary artery disease  involving native coronary artery of native heart with angina pectoris [facility])   [place], Megan P, DO   1 year ago Uncontrolled type 2 diabetes mellitus with hyperglycemia ([place])   [place], Megan P, DO   1 year ago Hypertensive heart failure ([place])   [place], Megan P, DO      Future Appointments            In 2 weeks Johnson, Megan P, DO [place]            metoprolol tartrate (LOPRESSOR) 50 MG tablet 90 tablet 0    Sig: Take 1 tablet (50 mg total) by mouth daily.     Cardiovascular:  Beta Blockers Passed - 08/17/2019  8:51 AM      Passed - Last BP in normal range    BP Readings from Last 1 Encounters:  02/14/19 138/79         Passed - Last Heart Rate in normal range    Pulse Readings from Last 1 Encounters:  02/14/19 66         Passed - Valid encounter within last 6 months    Recent Outpatient Visits          6 months ago Controlled type 2 diabetes mellitus with hyperglycemia, without long-term current use of insulin ([place])   [place], Megan P, DO   1 year ago Insomnia, unspecified type   [place], Megan P, DO   1 year ago Coronary artery disease involving native coronary artery of native heart with angina pectoris [facility])   [place], Megan P, DO   1 year ago Uncontrolled type 2 diabetes mellitus with hyperglycemia ([place])   [place], Megan P, DO   1 year ago Hypertensive heart failure [facility])   [place], Barb Merino, DO      Future Appointments            In 2 weeks Wynetta Emery, Barb Merino, DO [facility], [facility]

## 2019-09-04 ENCOUNTER — Ambulatory Visit (INDEPENDENT_AMBULATORY_CARE_PROVIDER_SITE_OTHER): Payer: Medicare Other | Admitting: Family Medicine

## 2019-09-04 ENCOUNTER — Other Ambulatory Visit: Payer: Self-pay

## 2019-09-04 ENCOUNTER — Encounter: Payer: Self-pay | Admitting: Family Medicine

## 2019-09-04 ENCOUNTER — Telehealth: Payer: Self-pay | Admitting: Family Medicine

## 2019-09-04 VITALS — BP 154/83 | HR 67 | Temp 98.3°F | Ht 70.8 in | Wt 187.0 lb

## 2019-09-04 DIAGNOSIS — I110 Hypertensive heart disease with heart failure: Secondary | ICD-10-CM | POA: Diagnosis not present

## 2019-09-04 DIAGNOSIS — E782 Mixed hyperlipidemia: Secondary | ICD-10-CM

## 2019-09-04 DIAGNOSIS — R5383 Other fatigue: Secondary | ICD-10-CM | POA: Diagnosis not present

## 2019-09-04 DIAGNOSIS — E1165 Type 2 diabetes mellitus with hyperglycemia: Secondary | ICD-10-CM | POA: Diagnosis not present

## 2019-09-04 DIAGNOSIS — Z23 Encounter for immunization: Secondary | ICD-10-CM | POA: Diagnosis not present

## 2019-09-04 DIAGNOSIS — F332 Major depressive disorder, recurrent severe without psychotic features: Secondary | ICD-10-CM

## 2019-09-04 DIAGNOSIS — G894 Chronic pain syndrome: Secondary | ICD-10-CM | POA: Diagnosis not present

## 2019-09-04 DIAGNOSIS — I701 Atherosclerosis of renal artery: Secondary | ICD-10-CM | POA: Diagnosis not present

## 2019-09-04 DIAGNOSIS — Z1211 Encounter for screening for malignant neoplasm of colon: Secondary | ICD-10-CM | POA: Diagnosis not present

## 2019-09-04 LAB — UA/M W/RFLX CULTURE, ROUTINE
Bilirubin, UA: NEGATIVE
Glucose, UA: NEGATIVE
Ketones, UA: NEGATIVE
Leukocytes,UA: NEGATIVE
Nitrite, UA: NEGATIVE
RBC, UA: NEGATIVE
Specific Gravity, UA: 1.025 (ref 1.005–1.030)
Urobilinogen, Ur: 0.2 mg/dL (ref 0.2–1.0)
pH, UA: 5.5 (ref 5.0–7.5)

## 2019-09-04 LAB — BAYER DCA HB A1C WAIVED: HB A1C (BAYER DCA - WAIVED): 7.2 % — ABNORMAL HIGH (ref <7.0)

## 2019-09-04 LAB — MICROSCOPIC EXAMINATION
Bacteria, UA: NONE SEEN
RBC, Urine: NONE SEEN /hpf (ref 0–2)
WBC, UA: NONE SEEN /hpf (ref 0–5)

## 2019-09-04 LAB — MICROALBUMIN, URINE WAIVED
Creatinine, Urine Waived: 200 mg/dL (ref 10–300)
Microalb, Ur Waived: 150 mg/L — ABNORMAL HIGH (ref 0–19)

## 2019-09-04 MED ORDER — METFORMIN HCL 500 MG PO TABS: ORAL_TABLET | ORAL | 1 refills | Status: DC

## 2019-09-04 MED ORDER — LISINOPRIL 10 MG PO TABS: 10.0000 mg | ORAL_TABLET | Freq: Every day | ORAL | 1 refills | Status: DC

## 2019-09-04 MED ORDER — PREGABALIN 75 MG PO CAPS: ORAL_CAPSULE | ORAL | 2 refills | Status: DC

## 2019-09-04 MED ORDER — METOPROLOL TARTRATE 50 MG PO TABS: 50.0000 mg | ORAL_TABLET | Freq: Every day | ORAL | 1 refills | Status: DC

## 2019-09-04 MED ORDER — JARDIANCE 25 MG PO TABS: 25.0000 mg | ORAL_TABLET | Freq: Every day | ORAL | 2 refills | Status: DC

## 2019-09-04 MED ORDER — LOVASTATIN 20 MG PO TABS: ORAL_TABLET | ORAL | 1 refills | Status: DC

## 2019-09-04 NOTE — Telephone Encounter (Signed)
Pharmacy needs clarification on instructions for pregabalin (LYRICA) 75 MG capsule Before they can fill. Please advise.  [place], [place] [phone] (Phone) [phone] (Fax)

## 2019-09-04 NOTE — Telephone Encounter (Signed)
As below. Thank you!

## 2019-09-04 NOTE — Telephone Encounter (Signed)
Verbal from Dr.Johnson Week 1: 1 tablet qam Week 2: 1 tablet qam and 1 tablet qpm  Week 3: 2 tablets qam and 1 tablet qpm Week 4: 2 tablets qam and 2 tablets qpm  Pharmacy notified.

## 2019-09-04 NOTE — Telephone Encounter (Signed)
Pharmacy needs clarification on instructions for pregabalin (LYRICA) 75 MG capsule Before they can fill. Please advise

## 2019-09-05 ENCOUNTER — Encounter: Payer: Self-pay | Admitting: Family Medicine

## 2019-09-05 LAB — CBC WITH DIFFERENTIAL/PLATELET
Basophils Absolute: 0.1 10*3/uL (ref 0.0–0.2)
Basos: 1 %
EOS (ABSOLUTE): 0.2 10*3/uL (ref 0.0–0.4)
Eos: 4 %
Hematocrit: 39.4 % (ref 37.5–51.0)
Hemoglobin: 13.2 g/dL (ref 13.0–17.7)
Immature Grans (Abs): 0 10*3/uL (ref 0.0–0.1)
Immature Granulocytes: 0 %
Lymphocytes Absolute: 1.6 10*3/uL (ref 0.7–3.1)
Lymphs: 24 %
MCH: 29.6 pg (ref 26.6–33.0)
MCHC: 33.5 g/dL (ref 31.5–35.7)
MCV: 88 fL (ref 79–97)
Monocytes Absolute: 0.5 10*3/uL (ref 0.1–0.9)
Monocytes: 7 %
Neutrophils Absolute: 4.4 10*3/uL (ref 1.4–7.0)
Neutrophils: 64 %
Platelets: 204 10*3/uL (ref 150–450)
RBC: 4.46 x10E6/uL (ref 4.14–5.80)
RDW: 14.8 % (ref 11.6–15.4)
WBC: 6.8 10*3/uL (ref 3.4–10.8)

## 2019-09-05 LAB — COMPREHENSIVE METABOLIC PANEL
ALT: 40 IU/L (ref 0–44)
AST: 36 IU/L (ref 0–40)
Albumin/Globulin Ratio: 1.7 (ref 1.2–2.2)
Albumin: 4.2 g/dL (ref 3.8–4.8)
Alkaline Phosphatase: 78 IU/L (ref 39–117)
BUN/Creatinine Ratio: 13 (ref 10–24)
BUN: 16 mg/dL (ref 8–27)
Bilirubin Total: 0.3 mg/dL (ref 0.0–1.2)
CO2: 21 mmol/L (ref 20–29)
Calcium: 9.3 mg/dL (ref 8.6–10.2)
Chloride: 103 mmol/L (ref 96–106)
Creatinine, Ser: 1.2 mg/dL (ref 0.76–1.27)
GFR calc Af Amer: 75 mL/min/{1.73_m2} (ref >59)
GFR calc non Af Amer: 65 mL/min/{1.73_m2} (ref >59)
Globulin, Total: 2.5 g/dL (ref 1.5–4.5)
Glucose: 209 mg/dL — ABNORMAL HIGH (ref 65–99)
Potassium: 4.8 mmol/L (ref 3.5–5.2)
Sodium: 140 mmol/L (ref 134–144)
Total Protein: 6.7 g/dL (ref 6.0–8.5)

## 2019-09-05 LAB — LIPID PANEL W/O CHOL/HDL RATIO
Cholesterol, Total: 201 mg/dL — ABNORMAL HIGH (ref 100–199)
HDL: 37 mg/dL — ABNORMAL LOW (ref >39)
LDL Chol Calc (NIH): 100 mg/dL — ABNORMAL HIGH (ref 0–99)
Triglycerides: 377 mg/dL — ABNORMAL HIGH (ref 0–149)
VLDL Cholesterol Cal: 64 mg/dL — ABNORMAL HIGH (ref 5–40)

## 2019-09-05 LAB — TSH: TSH: 1.46 u[IU]/mL (ref 0.450–4.500)

## 2019-09-10 NOTE — Assessment & Plan Note (Signed)
Going in the wrong direction with A1c of 7.2- will start jardiance and recheck in 3 months. Call with any concerns.

## 2019-09-10 NOTE — Assessment & Plan Note (Signed)
Will start him on lyrica. Call with any concerns. Continue to monitor. Recheck 1 month.

## 2019-09-10 NOTE — Assessment & Plan Note (Signed)
Under good control on current regimen. Continue current regimen. Continue to monitor. Call with any concerns. Refills given. Labs drawn today.   

## 2019-09-10 NOTE — Assessment & Plan Note (Signed)
Not interested in medication. Continue to monitor. Call with any concerns.  

## 2019-09-10 NOTE — Progress Notes (Signed)
BP (!) 154/83   Pulse 67   Temp 98.3 F (36.8 C) (Oral)   Ht 5' 10.8" (1.798 m)   Wt 187 lb (84.8 kg)   SpO2 98%   BMI 26.23 kg/m    Subjective:    Patient ID: Lucas Weiss, male    DOB: [birth year], 63 y.o.   MRN: [id number]  HPI: Lucas Weiss is a 63 y.o. male  Chief Complaint  Patient presents with  . Diabetes  . Hypertension  . Hyperlipidemia  . Back Pain    pt states if he can get something for back and legs pain   HYPERTENSION / HYPERLIPIDEMIA  Satisfied with current treatment? yes  Duration of hypertension: chronic  BP monitoring frequency: not checking  BP medication side effects: no  Past BP meds: metoprolol, lisinopril  Duration of hyperlipidemia: chronic  Cholesterol medication side effects: no  Cholesterol supplements: none  Past cholesterol medications: lovastatin  Medication compliance: excellent compliance  Aspirin: no  Recent stressors: no  Recurrent headaches: no  Visual changes: no  Palpitations: no  Dyspnea: no  Chest pain: no  Lower extremity edema: no  Dizzy/lightheaded: no   DIABETES  Hypoglycemic episodes:no  Polydipsia/polyuria: no  Visual disturbance: no  Chest pain: no  Paresthesias: no  Glucose Monitoring: yes  Taking Insulin?: no  Blood Pressure Monitoring: not checking  Retinal Examination: Not up to Date  Foot Exam: Up to Date  Diabetic Education: Completed  Pneumovax: Up to Date  Influenza: Given today  Aspirin: no   BACK PAIN  Duration: past few days  Mechanism of injury: unknown  Location: bilateral and low back  Onset: gradual  Severity: severe  Quality: aching and sore  Frequency: constant  Radiation: down his legs  Aggravating factors: lifting, movement, walking, laying and bending  Alleviating factors: rest, ice, heat, laying, NSAIDs and APAP  Status: worse  Treatments attempted: rest, ice, heat, APAP, ibuprofen and aleve  Relief with NSAIDs?: No NSAIDs Taken  Nighttime pain: no  Paresthesias /  decreased sensation: yes  Bowel / bladder incontinence: no  Fevers: no  Dysuria / urinary frequency: no  Relevant past medical, surgical, family and social history reviewed and updated as indicated. Interim medical history since our last visit reviewed. Allergies and medications reviewed and updated.  Review of Systems  Constitutional: Negative.   Respiratory: Negative.   Cardiovascular: Negative.   Gastrointestinal: Negative.   Musculoskeletal: Positive for arthralgias, back pain and myalgias. Negative for gait problem, joint swelling, neck pain and neck stiffness.  Skin: Negative.   Neurological: Positive for weakness. Negative for dizziness, tremors, seizures, syncope, facial asymmetry, speech difficulty, light-headedness, numbness and headaches.  Psychiatric/Behavioral: Negative.     Per HPI unless specifically indicated above     Objective:    BP (!) 154/83   Pulse 67   Temp 98.3 F (36.8 C) (Oral)   Ht 5' 10.8" (1.798 m)   Wt 187 lb (84.8 kg)   SpO2 98%   BMI 26.23 kg/m   Wt Readings from Last 3 Encounters:  09/04/19 187 lb (84.8 kg)  02/14/19 186 lb (84.4 kg)  07/25/18 190 lb 6.4 oz (86.4 kg)    Physical Exam Vitals signs and nursing note reviewed.  Constitutional:      General: He is not in acute distress.    Appearance: Normal appearance. He is not ill-appearing, toxic-appearing or diaphoretic.  HENT:     Head: Normocephalic and atraumatic.     Right Ear: External  ear normal.     Left Ear: External ear normal.     Nose: Nose normal.     Mouth/Throat:     Mouth: Mucous membranes are moist.     Pharynx: Oropharynx is clear.  Eyes:     General: No scleral icterus.       Right eye: No discharge.        Left eye: No discharge.     Extraocular Movements: Extraocular movements intact.     Conjunctiva/sclera: Conjunctivae normal.     Pupils: Pupils are equal, round, and reactive to light.  Neck:     Musculoskeletal: Normal range of motion and neck supple.   Cardiovascular:     Rate and Rhythm: Normal rate and regular rhythm.     Pulses: Normal pulses.     Heart sounds: Normal heart sounds. No murmur. No friction rub. No gallop.   Pulmonary:     Effort: Pulmonary effort is normal. No respiratory distress.     Breath sounds: Normal breath sounds. No stridor. No wheezing, rhonchi or rales.  Chest:     Chest wall: No tenderness.  Musculoskeletal: Normal range of motion.  Skin:    General: Skin is warm and dry.     Capillary Refill: Capillary refill takes less than 2 seconds.     Coloration: Skin is not jaundiced or pale.     Findings: No bruising, erythema, lesion or rash.  Neurological:     General: No focal deficit present.     Mental Status: He is alert and oriented to person, place, and time. Mental status is at baseline.  Psychiatric:        Mood and Affect: Mood normal.        Behavior: Behavior normal.        Thought Content: Thought content normal.        Judgment: Judgment normal.     Results for orders placed or performed in visit on 09/04/19  Microscopic Examination   BLD  Result Value Ref Range   WBC, UA None seen 0 - 5 /hpf   RBC None seen 0 - 2 /hpf   Epithelial Cells (non renal) 0-10 0 - 10 /hpf   Bacteria, UA None seen None seen/Few  Bayer DCA Hb A1c Waived  Result Value Ref Range   HB A1C (BAYER DCA - WAIVED) 7.2 (H) <7.0 %  Comprehensive metabolic panel  Result Value Ref Range   Glucose 209 (H) 65 - 99 mg/dL   BUN 16 8 - 27 mg/dL   Creatinine, Ser 0.981.20 0.76 - 1.27 mg/dL   GFR calc non Af Amer 65 >59 mL/min/1.73   GFR calc Af Amer 75 >59 mL/min/1.73   BUN/Creatinine Ratio 13 10 - 24   Sodium 140 134 - 144 mmol/L   Potassium 4.8 3.5 - 5.2 mmol/L   Chloride 103 96 - 106 mmol/L   CO2 21 20 - 29 mmol/L   Calcium 9.3 8.6 - 10.2 mg/dL   Total Protein 6.7 6.0 - 8.5 g/dL   Albumin 4.2 3.8 - 4.8 g/dL   Globulin, Total 2.5 1.5 - 4.5 g/dL   Albumin/Globulin Ratio 1.7 1.2 - 2.2   Bilirubin Total 0.3 0.0 - 1.2  mg/dL   Alkaline Phosphatase 78 39 - 117 IU/L   AST 36 0 - 40 IU/L   ALT 40 0 - 44 IU/L  Lipid Panel w/o Chol/HDL Ratio  Result Value Ref Range   Cholesterol, Total 201 (H) 100 - 199  mg/dL   Triglycerides 161377 (H) 0 - 149 mg/dL   HDL 37 (L) >09>39 mg/dL   VLDL Cholesterol Cal 64 (H) 5 - 40 mg/dL   LDL Chol Calc (NIH) 604100 (H) 0 - 99 mg/dL  Microalbumin, Urine Waived  Result Value Ref Range   Microalb, Ur Waived 150 (H) 0 - 19 mg/L   Creatinine, Urine Waived 200 10 - 300 mg/dL   Microalb/Creat Ratio 30-300 (H) <30 mg/g  TSH  Result Value Ref Range   TSH 1.460 0.450 - 4.500 uIU/mL  UA/M w/rflx Culture, Routine   Specimen: Blood   BLD  Result Value Ref Range   Specific Gravity, UA 1.025 1.005 - 1.030   pH, UA 5.5 5.0 - 7.5   Color, UA Yellow Yellow   Appearance Ur Clear Clear   Leukocytes,UA Negative Negative   Protein,UA 2+ (A) Negative/Trace   Glucose, UA Negative Negative   Ketones, UA Negative Negative   RBC, UA Negative Negative   Bilirubin, UA Negative Negative   Urobilinogen, Ur 0.2 0.2 - 1.0 mg/dL   Nitrite, UA Negative Negative   Microscopic Examination See below:   CBC with Differential/Platelet  Result Value Ref Range   WBC 6.8 3.4 - 10.8 x10E3/uL   RBC 4.46 4.14 - 5.80 x10E6/uL   Hemoglobin 13.2 13.0 - 17.7 g/dL   Hematocrit 54.039.4 98.137.5 - 51.0 %   MCV 88 79 - 97 fL   MCH 29.6 26.6 - 33.0 pg   MCHC 33.5 31.5 - 35.7 g/dL   RDW 19.114.8 47.811.6 - 29.515.4 %   Platelets 204 150 - 450 x10E3/uL   Neutrophils 64 Not Estab. %   Lymphs 24 Not Estab. %   Monocytes 7 Not Estab. %   Eos 4 Not Estab. %   Basos 1 Not Estab. %   Neutrophils Absolute 4.4 1.4 - 7.0 x10E3/uL   Lymphocytes Absolute 1.6 0.7 - 3.1 x10E3/uL   Monocytes Absolute 0.5 0.1 - 0.9 x10E3/uL   EOS (ABSOLUTE) 0.2 0.0 - 0.4 x10E3/uL   Basophils Absolute 0.1 0.0 - 0.2 x10E3/uL   Immature Granulocytes 0 Not Estab. %   Immature Grans (Abs) 0.0 0.0 - 0.1 x10E3/uL      Assessment & Plan:   Problem List Items  Addressed This Visit      Cardiovascular and Mediastinum   Hypertensive heart failure (HCC) - Primary    Under good control on current regimen. Continue current regimen. Continue to monitor. Call with any concerns. Refills given. Labs drawn today.        Relevant Medications   lovastatin (MEVACOR) 20 MG tablet   lisinopril (ZESTRIL) 10 MG tablet   metoprolol tartrate (LOPRESSOR) 50 MG tablet   Other Relevant Orders   Comprehensive metabolic panel (Completed)   Microalbumin, Urine Waived (Completed)   TSH (Completed)   CBC with Differential/Platelet (Completed)     Endocrine   Controlled type 2 diabetes mellitus (HCC)    Going in the wrong direction with A1c of 7.2- will start jardiance and recheck in 3 months. Call with any concerns.       Relevant Medications   metFORMIN (GLUCOPHAGE) 500 MG tablet   empagliflozin (JARDIANCE) 25 MG TABS tablet   lovastatin (MEVACOR) 20 MG tablet   lisinopril (ZESTRIL) 10 MG tablet   Other Relevant Orders   Bayer DCA Hb A1c Waived (Completed)   Comprehensive metabolic panel (Completed)   UA/M w/rflx Culture, Routine (Completed)     Other   HLD (hyperlipidemia)  Under good control on current regimen. Continue current regimen. Continue to monitor. Call with any concerns. Refills given. Labs drawn today.        Relevant Medications   lovastatin (MEVACOR) 20 MG tablet   lisinopril (ZESTRIL) 10 MG tablet   metoprolol tartrate (LOPRESSOR) 50 MG tablet   Other Relevant Orders   Comprehensive metabolic panel (Completed)   Lipid Panel w/o Chol/HDL Ratio (Completed)   Severe recurrent major depression (HCC)    Not interested in medication. Continue to monitor. Call with any concerns.       Relevant Orders   Comprehensive metabolic panel (Completed)   Chronic pain    Will start him on lyrica. Call with any concerns. Continue to monitor. Recheck 1 month.       Relevant Medications   pregabalin (LYRICA) 75 MG capsule    Other Visit  Diagnoses    Other fatigue       Checking labs. Await results.    Relevant Orders   TSH (Completed)   Flu vaccine need       Flu shot given today.    Relevant Orders   Flu Vaccine QUAD 36+ mos IM (Completed)   Screening for colon cancer       Cologuard ordered today.   Relevant Orders   Cologuard       Follow up plan: Return in about 4 weeks (around 10/02/2019).

## 2019-10-05 ENCOUNTER — Encounter: Payer: Self-pay | Admitting: Family Medicine

## 2019-10-05 ENCOUNTER — Ambulatory Visit (INDEPENDENT_AMBULATORY_CARE_PROVIDER_SITE_OTHER): Payer: Medicare Other | Admitting: Family Medicine

## 2019-10-05 ENCOUNTER — Other Ambulatory Visit: Payer: Self-pay

## 2019-10-05 DIAGNOSIS — I701 Atherosclerosis of renal artery: Secondary | ICD-10-CM

## 2019-10-05 DIAGNOSIS — G894 Chronic pain syndrome: Secondary | ICD-10-CM

## 2019-10-05 MED ORDER — PREGABALIN 150 MG PO CAPS: 150.0000 mg | ORAL_CAPSULE | Freq: Two times a day (BID) | ORAL | 1 refills | Status: DC

## 2019-10-05 NOTE — Progress Notes (Signed)
BP 136/78   Pulse 66   Temp 98.7 F (37.1 C) (Oral)   Ht 5\' 11"  (1.803 m)   Wt 190 lb (86.2 kg)   SpO2 97%   BMI 26.50 kg/m    Subjective:    Patient ID: Lucas Weiss, male    DOB: 1956/10/01, 63 y.o.   MRN: [id number]  HPI: Lucas Weiss is a 63 y.o. male  Chief Complaint  Patient presents with  . Pain    4w f/u   CHRONIC PAIN- started on lyrica last visit. Has been on it for 1 month. Feeling a lot better! Back is feeling better. Present dose: 0  Morphine equivalents Pain control status: better Duration: chronic Location: low back Quality: aching and sore Current Pain Level: mild Previous Pain Level: severe Breakthrough pain: no Benefit from narcotic medications: not on any What Activities task can be accomplished with current medication? Able to do his ADLs Interested in weaning off narcotics: N/A   Stool softners/OTC fiber: no  Previous pain specialty evaluation: no Non-narcotic analgesic meds: yes Narcotic contract: no   Relevant past medical, surgical, family and social history reviewed and updated as indicated. Interim medical history since our last visit reviewed. Allergies and medications reviewed and updated.  Review of Systems  Constitutional: Negative.   Respiratory: Negative.   Cardiovascular: Negative.   Musculoskeletal: Negative.   Neurological: Negative.   Psychiatric/Behavioral: Negative.     Per HPI unless specifically indicated above     Objective:    BP 136/78   Pulse 66   Temp 98.7 F (37.1 C) (Oral)   Ht 5\' 11"  (1.803 m)   Wt 190 lb (86.2 kg)   SpO2 97%   BMI 26.50 kg/m   Wt Readings from Last 3 Encounters:  10/05/19 190 lb (86.2 kg)  09/04/19 187 lb (84.8 kg)  02/14/19 186 lb (84.4 kg)    Physical Exam Vitals signs and nursing note reviewed.  Constitutional:      General: He is not in acute distress.    Appearance: Normal appearance. He is not ill-appearing, toxic-appearing or diaphoretic.  HENT:     Head:  Normocephalic and atraumatic.     Right Ear: External ear normal.     Left Ear: External ear normal.     Nose: Nose normal.     Mouth/Throat:     Mouth: Mucous membranes are moist.     Pharynx: Oropharynx is clear.  Eyes:     General: No scleral icterus.       Right eye: No discharge.        Left eye: No discharge.     Extraocular Movements: Extraocular movements intact.     Conjunctiva/sclera: Conjunctivae normal.     Pupils: Pupils are equal, round, and reactive to light.  Neck:     Musculoskeletal: Normal range of motion and neck supple.  Cardiovascular:     Rate and Rhythm: Normal rate and regular rhythm.     Pulses: Normal pulses.     Heart sounds: Normal heart sounds. No murmur. No friction rub. No gallop.   Pulmonary:     Effort: Pulmonary effort is normal. No respiratory distress.     Breath sounds: Normal breath sounds. No stridor. No wheezing, rhonchi or rales.  Chest:     Chest wall: No tenderness.  Musculoskeletal: Normal range of motion.  Skin:    General: Skin is warm and dry.     Capillary Refill: Capillary refill takes less than  2 seconds.     Coloration: Skin is not jaundiced or pale.     Findings: No bruising, erythema, lesion or rash.  Neurological:     General: No focal deficit present.     Mental Status: He is alert and oriented to person, place, and time. Mental status is at baseline.  Psychiatric:        Mood and Affect: Mood normal.        Behavior: Behavior normal.        Thought Content: Thought content normal.        Judgment: Judgment normal.     Results for orders placed or performed in visit on 09/04/19  Microscopic Examination   BLD  Result Value Ref Range   WBC, UA None seen 0 - 5 /hpf   RBC None seen 0 - 2 /hpf   Epithelial Cells (non renal) 0-10 0 - 10 /hpf   Bacteria, UA None seen None seen/Few  Bayer DCA Hb A1c Waived  Result Value Ref Range   HB A1C (BAYER DCA - WAIVED) 7.2 (H) <7.0 %  Comprehensive metabolic panel  Result  Value Ref Range   Glucose 209 (H) 65 - 99 mg/dL   BUN 16 8 - 27 mg/dL   Creatinine, Ser 1.20 0.76 - 1.27 mg/dL   GFR calc non Af Amer 65 >59 mL/min/1.73   GFR calc Af Amer 75 >59 mL/min/1.73   BUN/Creatinine Ratio 13 10 - 24   Sodium 140 134 - 144 mmol/L   Potassium 4.8 3.5 - 5.2 mmol/L   Chloride 103 96 - 106 mmol/L   CO2 21 20 - 29 mmol/L   Calcium 9.3 8.6 - 10.2 mg/dL   Total Protein 6.7 6.0 - 8.5 g/dL   Albumin 4.2 3.8 - 4.8 g/dL   Globulin, Total 2.5 1.5 - 4.5 g/dL   Albumin/Globulin Ratio 1.7 1.2 - 2.2   Bilirubin Total 0.3 0.0 - 1.2 mg/dL   Alkaline Phosphatase 78 39 - 117 IU/L   AST 36 0 - 40 IU/L   ALT 40 0 - 44 IU/L  Lipid Panel w/o Chol/HDL Ratio  Result Value Ref Range   Cholesterol, Total 201 (H) 100 - 199 mg/dL   Triglycerides 377 (H) 0 - 149 mg/dL   HDL 37 (L) >39 mg/dL   VLDL Cholesterol Cal 64 (H) 5 - 40 mg/dL   LDL Chol Calc (NIH) 100 (H) 0 - 99 mg/dL  Microalbumin, Urine Waived  Result Value Ref Range   Microalb, Ur Waived 150 (H) 0 - 19 mg/L   Creatinine, Urine Waived 200 10 - 300 mg/dL   Microalb/Creat Ratio 30-300 (H) <30 mg/g  TSH  Result Value Ref Range   TSH 1.460 0.450 - 4.500 uIU/mL  UA/M w/rflx Culture, Routine   Specimen: Blood   BLD  Result Value Ref Range   Specific Gravity, UA 1.025 1.005 - 1.030   pH, UA 5.5 5.0 - 7.5   Color, UA Yellow Yellow   Appearance Ur Clear Clear   Leukocytes,UA Negative Negative   Protein,UA 2+ (A) Negative/Trace   Glucose, UA Negative Negative   Ketones, UA Negative Negative   RBC, UA Negative Negative   Bilirubin, UA Negative Negative   Urobilinogen, Ur 0.2 0.2 - 1.0 mg/dL   Nitrite, UA Negative Negative   Microscopic Examination See below:   CBC with Differential/Platelet  Result Value Ref Range   WBC 6.8 3.4 - 10.8 x10E3/uL   RBC 4.46 4.14 - 5.80 x10E6/uL  Hemoglobin 13.2 13.0 - 17.7 g/dL   Hematocrit 06.2 69.4 - 51.0 %   MCV 88 79 - 97 fL   MCH 29.6 26.6 - 33.0 pg   MCHC 33.5 31.5 - 35.7 g/dL    RDW 85.4 62.7 - 03.5 %   Platelets 204 150 - 450 x10E3/uL   Neutrophils 64 Not Estab. %   Lymphs 24 Not Estab. %   Monocytes 7 Not Estab. %   Eos 4 Not Estab. %   Basos 1 Not Estab. %   Neutrophils Absolute 4.4 1.4 - 7.0 x10E3/uL   Lymphocytes Absolute 1.6 0.7 - 3.1 x10E3/uL   Monocytes Absolute 0.5 0.1 - 0.9 x10E3/uL   EOS (ABSOLUTE) 0.2 0.0 - 0.4 x10E3/uL   Basophils Absolute 0.1 0.0 - 0.2 x10E3/uL   Immature Granulocytes 0 Not Estab. %   Immature Grans (Abs) 0.0 0.0 - 0.1 x10E3/uL      Assessment & Plan:   Problem List Items Addressed This Visit      Other   Chronic pain    Doing well. Pain significantly better. Will continue current dose of lyrica and recheck in 2 months with A1c. Call with any concerns.       Relevant Medications   pregabalin (LYRICA) 150 MG capsule       Follow up plan: Return in about 2 months (around 12/05/2019) for DM follow up.

## 2019-10-05 NOTE — Assessment & Plan Note (Signed)
Doing well. Pain significantly better. Will continue current dose of lyrica and recheck in 2 months with A1c. Call with any concerns.

## 2019-12-07 ENCOUNTER — Other Ambulatory Visit: Payer: Self-pay

## 2019-12-07 ENCOUNTER — Encounter: Payer: Self-pay | Admitting: Family Medicine

## 2019-12-07 ENCOUNTER — Ambulatory Visit (INDEPENDENT_AMBULATORY_CARE_PROVIDER_SITE_OTHER): Payer: Medicare Other | Admitting: Family Medicine

## 2019-12-07 VITALS — BP 188/83 | HR 65 | Temp 98.1°F | Ht 71.0 in | Wt 185.0 lb

## 2019-12-07 DIAGNOSIS — I701 Atherosclerosis of renal artery: Secondary | ICD-10-CM

## 2019-12-07 DIAGNOSIS — I10 Essential (primary) hypertension: Secondary | ICD-10-CM

## 2019-12-07 DIAGNOSIS — E1129 Type 2 diabetes mellitus with other diabetic kidney complication: Secondary | ICD-10-CM | POA: Diagnosis not present

## 2019-12-07 DIAGNOSIS — R809 Proteinuria, unspecified: Secondary | ICD-10-CM

## 2019-12-07 DIAGNOSIS — F332 Major depressive disorder, recurrent severe without psychotic features: Secondary | ICD-10-CM | POA: Diagnosis not present

## 2019-12-07 DIAGNOSIS — G894 Chronic pain syndrome: Secondary | ICD-10-CM | POA: Diagnosis not present

## 2019-12-07 LAB — BAYER DCA HB A1C WAIVED: HB A1C (BAYER DCA - WAIVED): 7.4 % — ABNORMAL HIGH (ref <7.0)

## 2019-12-07 MED ORDER — LISINOPRIL 20 MG PO TABS: 20.0000 mg | ORAL_TABLET | Freq: Every day | ORAL | 1 refills | Status: DC

## 2019-12-07 MED ORDER — QUETIAPINE FUMARATE 25 MG PO TABS: 25.0000 mg | ORAL_TABLET | Freq: Every day | ORAL | 1 refills | Status: DC

## 2019-12-07 NOTE — Assessment & Plan Note (Signed)
Doing a bit worse. Does not want to fill out a Phq9. Will restart his seroquel. Call with any concerns.

## 2019-12-07 NOTE — Progress Notes (Signed)
BP (!) 188/83 (BP Location: Left Arm, Patient Position: Sitting, Cuff Size: Normal)   Pulse 65   Temp 98.1 F (36.7 C) (Oral)   Ht 5\' 11"  (1.803 m)   Wt 185 lb (83.9 kg)   SpO2 98%   BMI 25.80 kg/m    Subjective:    Patient ID: Lucas Weiss, male    DOB: [birth year], 63 y.o.   MRN: [id number]  HPI: Lucas Weiss is a 63 y.o. male  Chief Complaint  Patient presents with  . Back Pain  . Diabetes   DIABETES Hypoglycemic episodes:no Polydipsia/polyuria: no Visual disturbance: no Chest pain: no Paresthesias: no Glucose Monitoring: no  Accucheck frequency: Not Checking  Fasting glucose:  Post prandial:  Evening:  Before meals: Taking Insulin?: no  Long acting insulin:  Short acting insulin: Blood Pressure Monitoring: not checking Retinal Examination: Up to Date Foot Exam: Up to Date Diabetic Education: Completed Pneumovax: Up to Date Influenza: Up to Date Aspirin: yes  DEPRESSION Mood status: worse Satisfied with current treatment?: yes Symptom severity: moderate  Duration of current treatment : has been off medicine for a couple of years Psychotherapy/counseling: no  Previous psychiatric medications: celexa, cymbalta, seroquel and wellbutrin Depressed mood: yes Anxious mood: yes Anhedonia: no Significant weight loss or gain: no Insomnia: yes  Fatigue: yes Feelings of worthlessness or guilt: yes Impaired concentration/indecisiveness: yes Suicidal ideations: no Hopelessness: no Crying spells: yes Depression screen [facility] 2/9 07/31/2019 02/14/2019 07/25/2018 07/21/2017 11/16/2016  Decreased Interest 0 0 0 0 3  Down, Depressed, Hopeless 0 0 0 0 3  PHQ - 2 Score 0 0 0 0 6  Altered sleeping - - - - 3  Tired, decreased energy - - - - 3  Change in appetite - - - - -  Feeling bad or failure about yourself  - - - - 3  Trouble concentrating - - - - 3  Moving slowly or fidgety/restless - - - - 3  Suicidal thoughts - - - - 0  PHQ-9 Score - - - - 21  Difficult doing  work/chores - - - - -  Some recent data might be hidden   HYPERTENSION Hypertension status: exacerbated  Satisfied with current treatment? no Duration of hypertension: chronic BP monitoring frequency:  not checking BP medication side effects:  no Medication compliance: fair compliance Previous BP meds:metoprolol, lisinopril Aspirin: yes Recurrent headaches: no Visual changes: no Palpitations: no Dyspnea: no Chest pain: no Lower extremity edema: no Dizzy/lightheaded: no   Relevant past medical, surgical, family and social history reviewed and updated as indicated. Interim medical history since our last visit reviewed. Allergies and medications reviewed and updated.  Review of Systems  Constitutional: Negative.   Respiratory: Negative.   Cardiovascular: Negative.   Musculoskeletal: Positive for back pain, gait problem and myalgias. Negative for arthralgias, joint swelling, neck pain and neck stiffness.  Skin: Negative.   Psychiatric/Behavioral: Positive for dysphoric mood and sleep disturbance. Negative for agitation, behavioral problems, confusion, decreased concentration, hallucinations, self-injury and suicidal ideas. The patient is nervous/anxious. The patient is not hyperactive.     Per HPI unless specifically indicated above     Objective:    BP (!) 188/83 (BP Location: Left Arm, Patient Position: Sitting, Cuff Size: Normal)   Pulse 65   Temp 98.1 F (36.7 C) (Oral)   Ht 5\' 11"  (1.803 m)   Wt 185 lb (83.9 kg)   SpO2 98%   BMI 25.80 kg/m   Wt Readings from Last  3 Encounters:  12/07/19 185 lb (83.9 kg)  10/05/19 190 lb (86.2 kg)  09/04/19 187 lb (84.8 kg)    Physical Exam Vitals and nursing note reviewed.  Constitutional:      General: He is not in acute distress.    Appearance: Normal appearance. He is not ill-appearing, toxic-appearing or diaphoretic.  HENT:     Head: Normocephalic and atraumatic.     Right Ear: External ear normal.     Left Ear:  External ear normal.     Nose: Nose normal.     Mouth/Throat:     Mouth: Mucous membranes are moist.     Pharynx: Oropharynx is clear.  Eyes:     General: No scleral icterus.       Right eye: No discharge.        Left eye: No discharge.     Extraocular Movements: Extraocular movements intact.     Conjunctiva/sclera: Conjunctivae normal.     Pupils: Pupils are equal, round, and reactive to light.  Cardiovascular:     Rate and Rhythm: Normal rate and regular rhythm.     Pulses: Normal pulses.     Heart sounds: Normal heart sounds. No murmur. No friction rub. No gallop.   Pulmonary:     Effort: Pulmonary effort is normal. No respiratory distress.     Breath sounds: Normal breath sounds. No stridor. No wheezing, rhonchi or rales.  Chest:     Chest wall: No tenderness.  Musculoskeletal:        General: Normal range of motion.     Cervical back: Normal range of motion and neck supple.  Skin:    General: Skin is warm and dry.     Capillary Refill: Capillary refill takes less than 2 seconds.     Coloration: Skin is not jaundiced or pale.     Findings: No bruising, erythema, lesion or rash.  Neurological:     General: No focal deficit present.     Mental Status: He is alert and oriented to person, place, and time. Mental status is at baseline.  Psychiatric:        Mood and Affect: Mood normal.        Behavior: Behavior normal.        Thought Content: Thought content normal.        Judgment: Judgment normal.     Results for orders placed or performed in visit on 09/04/19  Microscopic Examination   BLD  Result Value Ref Range   WBC, UA None seen 0 - 5 /hpf   RBC None seen 0 - 2 /hpf   Epithelial Cells (non renal) 0-10 0 - 10 /hpf   Bacteria, UA None seen None seen/Few  Bayer DCA Hb A1c Waived  Result Value Ref Range   HB A1C (BAYER DCA - WAIVED) 7.2 (H) <7.0 %  Comprehensive metabolic panel  Result Value Ref Range   Glucose 209 (H) 65 - 99 mg/dL   BUN 16 8 - 27 mg/dL    Creatinine, Ser 1.20 0.76 - 1.27 mg/dL   GFR calc non Af Amer 65 >59 mL/min/1.73   GFR calc Af Amer 75 >59 mL/min/1.73   BUN/Creatinine Ratio 13 10 - 24   Sodium 140 134 - 144 mmol/L   Potassium 4.8 3.5 - 5.2 mmol/L   Chloride 103 96 - 106 mmol/L   CO2 21 20 - 29 mmol/L   Calcium 9.3 8.6 - 10.2 mg/dL   Total Protein 6.7 6.0 -  8.5 g/dL   Albumin 4.2 3.8 - 4.8 g/dL   Globulin, Total 2.5 1.5 - 4.5 g/dL   Albumin/Globulin Ratio 1.7 1.2 - 2.2   Bilirubin Total 0.3 0.0 - 1.2 mg/dL   Alkaline Phosphatase 78 39 - 117 IU/L   AST 36 0 - 40 IU/L   ALT 40 0 - 44 IU/L  Lipid Panel w/o Chol/HDL Ratio  Result Value Ref Range   Cholesterol, Total 201 (H) 100 - 199 mg/dL   Triglycerides 130 (H) 0 - 149 mg/dL   HDL 37 (L) >86 mg/dL   VLDL Cholesterol Cal 64 (H) 5 - 40 mg/dL   LDL Chol Calc (NIH) 578 (H) 0 - 99 mg/dL  Microalbumin, Urine Waived  Result Value Ref Range   Microalb, Ur Waived 150 (H) 0 - 19 mg/L   Creatinine, Urine Waived 200 10 - 300 mg/dL   Microalb/Creat Ratio 30-300 (H) <30 mg/g  TSH  Result Value Ref Range   TSH 1.460 0.450 - 4.500 uIU/mL  UA/M w/rflx Culture, Routine   Specimen: Blood   BLD  Result Value Ref Range   Specific Gravity, UA 1.025 1.005 - 1.030   pH, UA 5.5 5.0 - 7.5   Color, UA Yellow Yellow   Appearance Ur Clear Clear   Leukocytes,UA Negative Negative   Protein,UA 2+ (A) Negative/Trace   Glucose, UA Negative Negative   Ketones, UA Negative Negative   RBC, UA Negative Negative   Bilirubin, UA Negative Negative   Urobilinogen, Ur 0.2 0.2 - 1.0 mg/dL   Nitrite, UA Negative Negative   Microscopic Examination See below:   CBC with Differential/Platelet  Result Value Ref Range   WBC 6.8 3.4 - 10.8 x10E3/uL   RBC 4.46 4.14 - 5.80 x10E6/uL   Hemoglobin 13.2 13.0 - 17.7 g/dL   Hematocrit 46.9 62.9 - 51.0 %   MCV 88 79 - 97 fL   MCH 29.6 26.6 - 33.0 pg   MCHC 33.5 31.5 - 35.7 g/dL   RDW 52.8 41.3 - 24.4 %   Platelets 204 150 - 450 x10E3/uL    Neutrophils 64 Not Estab. %   Lymphs 24 Not Estab. %   Monocytes 7 Not Estab. %   Eos 4 Not Estab. %   Basos 1 Not Estab. %   Neutrophils Absolute 4.4 1.4 - 7.0 x10E3/uL   Lymphocytes Absolute 1.6 0.7 - 3.1 x10E3/uL   Monocytes Absolute 0.5 0.1 - 0.9 x10E3/uL   EOS (ABSOLUTE) 0.2 0.0 - 0.4 x10E3/uL   Basophils Absolute 0.1 0.0 - 0.2 x10E3/uL   Immature Granulocytes 0 Not Estab. %   Immature Grans (Abs) 0.0 0.0 - 0.1 x10E3/uL      Assessment & Plan:   Problem List Items Addressed This Visit      Cardiovascular and Mediastinum   Essential hypertension    Not under good control. Will increase his lisinopril to  and recheck 19month. Call with any concerns.      Relevant Medications   lisinopril (ZESTRIL) 20 MG tablet     Endocrine   Controlled type 2 diabetes mellitus (HCC) - Primary    Did not start his jardiance. Having issues with cost. Will get CCM involved. Will start jardiance and recheck A1c 3 months. Call with any concerns.       Relevant Medications   lisinopril (ZESTRIL) 20 MG tablet   Other Relevant Orders   Bayer DCA Hb A1c Waived   [facility]     Other  Severe recurrent major depression (HCC)    Doing a bit worse. Does not want to fill out a Phq9. Will restart his seroquel. Call with any concerns.       Chronic pain    Did not tolerate his lyrica. Will stop it. Offered referral to pain management. He declined. Call with any concerns.           Follow up plan: Return in about 4 weeks (around 01/04/2020).

## 2019-12-07 NOTE — Patient Instructions (Signed)
START TAKING THE JARDIANCE (at [employer]) For your Sugars  Start taking 2 lisinopril until you get the new Rx then just take 1.  I'll see you in 1 month.

## 2019-12-07 NOTE — Assessment & Plan Note (Signed)
Did not tolerate his lyrica. Will stop it. Offered referral to pain management. He declined. Call with any concerns.

## 2019-12-07 NOTE — Assessment & Plan Note (Signed)
Not under good control. Will increase his lisinopril to 20mg and recheck 1 month. Call with any concerns.  

## 2019-12-07 NOTE — Assessment & Plan Note (Signed)
Did not start his jardiance. Having issues with cost. Will get CCM involved. Will start jardiance and recheck A1c 3 months. Call with any concerns.

## 2019-12-08 ENCOUNTER — Telehealth: Payer: Self-pay | Admitting: Family Medicine

## 2019-12-08 NOTE — Chronic Care Management (AMB) (Signed)
  Chronic Care Management   Note  12/08/2019 Name: Lucas Weiss MRN: [id number] DOB: Jan 30, 1956  Lucas Weiss is a 63 y.o. year old male who is a primary care patient of [name], DO. I reached out to Lucas Weiss by phone today in response to a referral sent by [name]'s PCP, Park Liter DO     [name] was given information about Chronic Care Management services today including:  1. CCM service includes personalized support from designated clinical staff supervised by his physician, including individualized plan of care and coordination with other care providers 2. 24/7 contact phone numbers for assistance for urgent and routine care needs. 3. Service will only be billed when office clinical staff spend 20 minutes or more in a month to coordinate care. 4. Only one practitioner may furnish and bill the service in a calendar month. 5. The patient may stop CCM services at any time (effective at the end of the month) by phone call to the office staff. 6. The patient will be responsible for cost sharing (co-pay) of up to 20% of the service fee (after annual deductible is met).  Patient agreed to services and verbal consent obtained.   Follow up plan: Telephone appointment with CCM team member scheduled for:12/29/2019  Glenna Durand, [facility] ??[email or web address] ??[phone]

## 2019-12-29 ENCOUNTER — Ambulatory Visit (INDEPENDENT_AMBULATORY_CARE_PROVIDER_SITE_OTHER): Payer: Medicare Other | Admitting: Pharmacist

## 2019-12-29 DIAGNOSIS — E1129 Type 2 diabetes mellitus with other diabetic kidney complication: Secondary | ICD-10-CM | POA: Diagnosis not present

## 2019-12-29 DIAGNOSIS — F332 Major depressive disorder, recurrent severe without psychotic features: Secondary | ICD-10-CM

## 2019-12-29 DIAGNOSIS — R809 Proteinuria, unspecified: Secondary | ICD-10-CM | POA: Diagnosis not present

## 2019-12-29 DIAGNOSIS — I25119 Atherosclerotic heart disease of native coronary artery with unspecified angina pectoris: Secondary | ICD-10-CM | POA: Diagnosis not present

## 2019-12-29 NOTE — Chronic Care Management (AMB) (Signed)
Chronic Care Management   Note  12/29/2019 Name: Lucas Weiss MRN: [id number] DOB: [birth year]   Subjective:  Lucas Weiss is a 64 y.o. year old male who is a primary care patient of Dorcas Carrow, DO. The CCM team was consulted for assistance with chronic disease management and care coordination needs.    Contacted patient for medication management review.   Review of patient status, including review of consultants reports, laboratory and other test data, was performed as part of comprehensive evaluation and provision of chronic care management services.   Objective:  Lab Results  Component Value Date   CREATININE 1.20 09/04/2019   CREATININE 1.19 02/14/2019   CREATININE 1.03 02/10/2018    Lab Results  Component Value Date   HGBA1C 7.4 (H) 12/07/2019       Component Value Date/Time   CHOL 201 (H) 09/04/2019 1340   TRIG 377 (H) 09/04/2019 1340   HDL 37 (L) 09/04/2019 1340   CHOLHDL 6.3 07/17/2011 0715   VLDL 48 (H) 07/17/2011 0715   LDLCALC 100 (H) 09/04/2019 1340    Clinical ASCVD: Yes - hx CABG, hs CVA  BP Readings from Last 3 Encounters:  12/07/19 (!) 188/83  10/05/19 136/78  09/04/19 (!) 154/83    No Known Allergies  Medications Reviewed Today    Reviewed by Lourena Simmonds, [facility] (Pharmacist) on 12/29/19 at 1512  Med List Status: <None>  Medication Order Taking? Sig Documenting Provider Last Dose Status Informant  aspirin EC 81 MG tablet [id number] Yes Take 81 mg by mouth daily. [provider] Taking Active   empagliflozin (JARDIANCE) 25 MG TABS tablet [id number] Yes Take 25 mg by mouth daily before breakfast. Olevia Perches P, DO Taking Active   lisinopril (ZESTRIL) 20 MG tablet [id number] Yes Take 1 tablet (20 mg total) by mouth daily. Johnson, Megan P, DO Taking Active   lovastatin (MEVACOR) 20 MG tablet [id number] Yes TAKE TWO TABLETS BY MOUTH EVERY NIGHT AT BEDTIME. Johnson, Megan P, DO Taking Active   metFORMIN (GLUCOPHAGE) 500 MG tablet  [id number] Yes TAKE TWO TABLETS BY MOUTH TWICE A DAY WITH FOOD. Johnson, Megan P, DO Taking Active   metoprolol tartrate (LOPRESSOR) 50 MG tablet [id number] Yes Take 1 tablet (50 mg total) by mouth daily. Johnson, Megan P, DO Taking Active   nitroGLYCERIN (NITROSTAT) 0.4 MG SL tablet [id number] No Place 1 tablet (0.4 mg total) under the tongue every 5 (five) minutes as needed for chest pain.  Patient not taking: Reported on 12/29/2019   Dorcas Carrow, DO Not Taking Active   QUEtiapine (SEROQUEL) 25 MG tablet [id number] Yes Take 1 tablet (25 mg total) by mouth at bedtime. Dorcas Carrow, DO Taking Active            Assessment:   Goals Addressed            This Visit's Progress     Patient Stated   . PharmD "I want to take care of myself" (pt-stated)       Current Barriers:  . Diabetes: uncontrolled; complicated by chronic medical conditions including CAD (s/p CABG 2009, reports CVA), PAD (s/p iliac stenting), renal artery stenosis, HTN, depression, most recent A1c 7.4% o Notes that he has a difficult time financially, as well as with some cognitive deficits s/p CVA. Many of these things coincided w/ divorce of his wife and losing property o Does NOT have prescription drug insurance. Notes [place] Pepper is a few hundred dollars . Current antihyperglycemic regimen: metformin  1000 mg BID, Jardiance 25 mg daily  . Cardiovascular risk reduction: o Current hypertensive regimen: lisinopril 20 mg daily, metoprolol tartrate 50 mg daily  o Current hyperlipidemia regimen: lovastatin 40 mg daily, LDL not at goal <70 o Current antiplatelet regimen: ASA 81 mg  . Depression/insomnia:  o Quetiapine 25 mg QPM  Pharmacist Clinical Goal(s):  Marland Kitchen Over the next 90 days, patient will work with PharmD and primary care provider to address optimizing medication management  Interventions: . Comprehensive medication review performed, medication list updated in electronic medical record . Discussed financial  concerns. Placed Care Guide referral.  . Discussed Medicare Extra Help. Patient would qualify due to his income, but notes that he has some rental property that would put him over the asset limit. Discussed application for Jardiance assistance for BI. Patient will qualify; we may have to show them proof of copay for Jardiance eventually to show that he is not eligible for Medicare Extra Help. Will collaborate w/ Susy Frizzle, CPhT to mail patient his portion of application . Metoprolol tartrate should be dosed twice daily. Will f/u w/ PCP about this prior to next appointment. . Per chart review, patient had previously been on atorvastatin and rosuvastatin. Unclear of why these were discontinued. Will evaluate moving forward, as patient should be on high intensity statin for secondary prevention  Patient Self Care Activities:  . Patient will check blood glucose daily, document, and provide at future appointments . Patient will take medications as prescribed . Patient will report any questions or concerns to provider   Initial goal documentation        Plan: - Will collaborate w/ patient, provider, and CPhT as above - Scheduled follow up call 01/30/20 @ 3 pm  Catie Darnelle Maffucci, PharmD, [place] [phone]

## 2019-12-29 NOTE — Patient Instructions (Signed)
Visit Information  Goals Addressed            This Visit's Progress     Patient Stated   . PharmD "I want to take care of myself" (pt-stated)       Current Barriers:  . Diabetes: uncontrolled; complicated by chronic medical conditions including CAD (s/p CABG 2009, reports CVA), PAD (s/p iliac stenting), renal artery stenosis, HTN, depression, most recent A1c 7.4% o Notes that he has a difficult time financially, as well as with some cognitive deficits s/p CVA. Many of these things coincided w/ divorce of his wife and losing property o Does NOT have prescription drug insurance. Notes [place] Pepper is a few hundred dollars . Current antihyperglycemic regimen: metformin 1000 mg BID, Jardiance 25 mg daily  . Cardiovascular risk reduction: o Current hypertensive regimen: lisinopril 20 mg daily, metoprolol tartrate 50 mg daily  o Current hyperlipidemia regimen: lovastatin 40 mg daily, LDL not at goal <70 o Current antiplatelet regimen: ASA 81 mg  . Depression/insomnia:  o Quetiapine 25 mg QPM  Pharmacist Clinical Goal(s):  Marland Kitchen Over the next 90 days, patient will work with PharmD and primary care provider to address optimizing medication management  Interventions: . Comprehensive medication review performed, medication list updated in electronic medical record . Discussed financial concerns. Placed Care Guide referral.  . Discussed Medicare Extra Help. Patient would qualify due to his income, but notes that he has some rental property that would put him over the asset limit. Discussed application for Jardiance assistance for BI. Patient will qualify; we may have to show them proof of copay for Jardiance eventually to show that he is not eligible for Medicare Extra Help. Will collaborate w/ Pattricia Boss, CPhT to mail patient his portion of application . Metoprolol tartrate should be dosed twice daily. Will f/u w/ PCP about this prior to next appointment. . Per chart review, patient had previously  been on atorvastatin and rosuvastatin. Unclear of why these were discontinued. Will evaluate moving forward, as patient should be on high intensity statin for secondary prevention  Patient Self Care Activities:  . Patient will check blood glucose daily, document, and provide at future appointments . Patient will take medications as prescribed . Patient will report any questions or concerns to provider   Initial goal documentation        The patient verbalized understanding of instructions provided today and declined a print copy of patient instruction materials.   Plan: - Will collaborate w/ patient, provider, and CPhT as above - Scheduled follow up call 01/30/20 @ 3 pm  Catie Feliz Beam, PharmD, [facility] Clinical Pharmacist [facility] [phone]

## 2020-01-02 ENCOUNTER — Other Ambulatory Visit: Payer: Self-pay | Admitting: Pharmacy Technician

## 2020-01-02 NOTE — Patient Outreach (Signed)
[facility]) Care Management  01/02/2020  SARVESH MEDDAUGH [birth year] [id number]                                        Medication Assistance Referral  Referral From: [facility] Embedded RPh Catie T.   Medication/Company: [place] [employer] / BI Patient application portion:  Mailed Provider application portion:  N/A [facility] embedded RPh to have signed while in clinic to Dr. Olevia Perches Provider address/fax verified via: Office website     Follow up:  Will follow up with patient in 5-10 business days to confirm application(s) have been received.  Lamonta Cypress P. Celine Dishman, CPhT Musician Care Management [phone]

## 2020-01-09 ENCOUNTER — Ambulatory Visit (INDEPENDENT_AMBULATORY_CARE_PROVIDER_SITE_OTHER): Payer: Medicare Other | Admitting: Family Medicine

## 2020-01-09 ENCOUNTER — Encounter: Payer: Self-pay | Admitting: Family Medicine

## 2020-01-09 DIAGNOSIS — I10 Essential (primary) hypertension: Secondary | ICD-10-CM

## 2020-01-09 DIAGNOSIS — F332 Major depressive disorder, recurrent severe without psychotic features: Secondary | ICD-10-CM

## 2020-01-09 DIAGNOSIS — I25119 Atherosclerotic heart disease of native coronary artery with unspecified angina pectoris: Secondary | ICD-10-CM | POA: Diagnosis not present

## 2020-01-09 MED ORDER — LISINOPRIL 30 MG PO TABS: 30.0000 mg | ORAL_TABLET | Freq: Every day | ORAL | 1 refills | Status: DC

## 2020-01-09 NOTE — Assessment & Plan Note (Signed)
Still running high. Will increase lisinopril to 30mg  and recheck 1 month. Call with any concerns.

## 2020-01-09 NOTE — Progress Notes (Signed)
BP (!) 147/75   Pulse 67    Subjective:    Patient ID: Lucas Weiss, male    DOB: 26-Dec-1955, 64 y.o.   MRN: [id number]  HPI: Lucas Weiss is a 64 y.o. male  Chief Complaint  Patient presents with  . Hypertension    1 month f/up    HYPERTENSION Hypertension status: better  Satisfied with current treatment? yes Duration of hypertension: chronic BP monitoring frequency:  rarely BP medication side effects:  no Medication compliance: excellent compliance Previous BP meds: metoprolol, lisinopril Aspirin: yes Recurrent headaches: no Visual changes: no Palpitations: no Dyspnea: no Chest pain: no Lower extremity edema: no Dizzy/lightheaded: no  DEPRESSION Mood status: better Satisfied with current treatment?: yes Symptom severity: moderate  Duration of current treatment : 1 month Side effects: no Medication compliance: good compliance Psychotherapy/counseling: no  Previous psychiatric medications: seroquel Depressed mood: yes Anxious mood: yes Anhedonia: no Significant weight loss or gain: no Insomnia: no  Fatigue: yes Feelings of worthlessness or guilt: no Impaired concentration/indecisiveness: no Suicidal ideations: no Hopelessness: no Crying spells: no Depression screen [facility] 2/9 01/09/2020 07/31/2019 02/14/2019 07/25/2018 07/21/2017  Decreased Interest 0 0 0 0 0  Down, Depressed, Hopeless 0 0 0 0 0  PHQ - 2 Score 0 0 0 0 0  Altered sleeping - - - - -  Tired, decreased energy - - - - -  Change in appetite - - - - -  Feeling bad or failure about yourself  - - - - -  Trouble concentrating - - - - -  Moving slowly or fidgety/restless - - - - -  Suicidal thoughts - - - - -  PHQ-9 Score - - - - -  Difficult doing work/chores - - - - -  Some recent data might be hidden    Relevant past medical, surgical, family and social history reviewed and updated as indicated. Interim medical history since our last visit reviewed. Allergies and medications reviewed and  updated.  Review of Systems  Constitutional: Negative.   Respiratory: Negative.   Cardiovascular: Negative.   Musculoskeletal: Positive for arthralgias, back pain and myalgias. Negative for gait problem, joint swelling, neck pain and neck stiffness.  Skin: Negative.   Neurological: Negative.   Psychiatric/Behavioral: Positive for dysphoric mood. Negative for agitation, behavioral problems, confusion, decreased concentration, hallucinations, self-injury, sleep disturbance and suicidal ideas. The patient is nervous/anxious. The patient is not hyperactive.     Per HPI unless specifically indicated above     Objective:    BP (!) 147/75   Pulse 67   Wt Readings from Last 3 Encounters:  12/07/19 185 lb (83.9 kg)  10/05/19 190 lb (86.2 kg)  09/04/19 187 lb (84.8 kg)    Physical Exam Vitals and nursing note reviewed.  Pulmonary:     Effort: Pulmonary effort is normal. No respiratory distress.     Comments: Speaking in full sentences Neurological:     Mental Status: He is alert.  Psychiatric:        Mood and Affect: Mood normal.        Behavior: Behavior normal.        Thought Content: Thought content normal.        Judgment: Judgment normal.     Results for orders placed or performed in visit on 12/07/19  Bayer DCA Hb A1c Waived  Result Value Ref Range   HB A1C (BAYER DCA - WAIVED) 7.4 (H) <7.0 %      Assessment &  Plan:   Problem List Items Addressed This Visit      Cardiovascular and Mediastinum   Essential hypertension    Still running high. Will increase lisinopril to 30mg  and recheck 1 month. Call with any concerns.      Relevant Medications   lisinopril (ZESTRIL) 30 MG tablet     Other   Severe recurrent major depression ([place])    Doing better on seroquel. Refuses PHQ9. Will continue seroquel and continue to monitor. Call with any concerns.           Follow up plan: Return in about 4 weeks (around 02/06/2020) for follow up BP.   Marland Kitchen This visit was  completed via telephone due to the restrictions of the COVID-19 pandemic. All issues as above were discussed and addressed but no physical exam was performed. If it was felt that the patient should be evaluated in the office, they were directed there. The patient verbally consented to this visit. Patient was unable to complete an audio/visual visit due to Lack of equipment. Due to the catastrophic nature of the COVID-19 pandemic, this visit was done through audio contact only. . Location of the patient: home . Location of the provider: work . Those involved with this call:  . Provider: Park Liter, DO . CMA: Yvonna Alanis, [place] . Front Desk/Registration: Don Perking  . Time spent on call: 25 minutes on the phone discussing health concerns. 40 minutes total spent in review of patient's record and preparation of their chart.

## 2020-01-09 NOTE — Assessment & Plan Note (Signed)
Doing better on seroquel. Refuses PHQ9. Will continue seroquel and continue to monitor. Call with any concerns.

## 2020-01-15 ENCOUNTER — Other Ambulatory Visit: Payer: Self-pay | Admitting: Pharmacy Technician

## 2020-01-15 NOTE — Patient Outreach (Signed)
[facility]) Care Management  01/15/2020  Lucas Weiss August 26, 1956 [id number]      Successful call placed to patient regarding patient assistance application(s) for Jardiance with BI , HIPAA identifiers verified.   Patient informed he has received the application but has not mailed it back to [place] yet. He informed he has no questions about the application and hopes to have it in the mail this week.  Follow up:  Will route note to embedded [facility] RPh Catie Feliz Beam for case closure if document(s) have not been received in the next 15 business days.  [name] P. Damika Harmon, CPhT Musician Care Management [phone]

## 2020-01-30 ENCOUNTER — Telehealth: Payer: Self-pay

## 2020-02-08 ENCOUNTER — Ambulatory Visit (INDEPENDENT_AMBULATORY_CARE_PROVIDER_SITE_OTHER): Payer: Medicare Other | Admitting: Family Medicine

## 2020-02-08 ENCOUNTER — Encounter: Payer: Self-pay | Admitting: Family Medicine

## 2020-02-08 DIAGNOSIS — E785 Hyperlipidemia, unspecified: Secondary | ICD-10-CM

## 2020-02-08 DIAGNOSIS — I10 Essential (primary) hypertension: Secondary | ICD-10-CM

## 2020-02-08 DIAGNOSIS — E1165 Type 2 diabetes mellitus with hyperglycemia: Secondary | ICD-10-CM | POA: Diagnosis not present

## 2020-02-08 DIAGNOSIS — I110 Hypertensive heart disease with heart failure: Secondary | ICD-10-CM

## 2020-02-08 DIAGNOSIS — E782 Mixed hyperlipidemia: Secondary | ICD-10-CM

## 2020-02-08 MED ORDER — METOPROLOL TARTRATE 50 MG PO TABS: 50.0000 mg | ORAL_TABLET | Freq: Every day | ORAL | 1 refills | Status: DC

## 2020-02-08 MED ORDER — JARDIANCE 25 MG PO TABS: 25.0000 mg | ORAL_TABLET | Freq: Every day | ORAL | 2 refills | Status: DC

## 2020-02-08 MED ORDER — METFORMIN HCL 500 MG PO TABS: ORAL_TABLET | ORAL | 1 refills | Status: DC

## 2020-02-08 MED ORDER — LOVASTATIN 20 MG PO TABS: ORAL_TABLET | ORAL | 1 refills | Status: DC

## 2020-02-08 MED ORDER — LISINOPRIL 40 MG PO TABS: 40.0000 mg | ORAL_TABLET | Freq: Every day | ORAL | 1 refills | Status: DC

## 2020-02-08 NOTE — Assessment & Plan Note (Signed)
Has not been taking his jardiance. Will restart it and recheck 1 month.

## 2020-02-08 NOTE — Assessment & Plan Note (Signed)
Not under good control- will increase to 40mg  and recheck 1 month. Call with any concerns. Continue to monitor.

## 2020-02-08 NOTE — Progress Notes (Signed)
BP (!) 169/69   Pulse (!) 57    Subjective:    Patient ID: Lucas Weiss, male    DOB: 1956/02/11, 64 y.o.   MRN: [id number]  HPI: Lucas Weiss is a 64 y.o. male  Chief Complaint  Patient presents with  . Hypertension   HYPERTENSION Hypertension status: uncontrolled  Satisfied with current treatment? no Duration of hypertension: chronic BP monitoring frequency:  occasionally BP medication side effects:  no Medication compliance: good compliance Previous BP meds:lisinopril Aspirin: yes Recurrent headaches: no Visual changes: no Palpitations: no Dyspnea: no Chest pain: no Lower extremity edema: no Dizzy/lightheaded: no  Relevant past medical, surgical, family and social history reviewed and updated as indicated. Interim medical history since our last visit reviewed. Allergies and medications reviewed and updated.  Review of Systems  Constitutional: Negative.   Respiratory: Negative.   Cardiovascular: Negative.   Musculoskeletal: Negative.   Neurological: Negative.   Psychiatric/Behavioral: Negative.     Per HPI unless specifically indicated above     Objective:    BP (!) 169/69   Pulse (!) 57   Wt Readings from Last 3 Encounters:  12/07/19 185 lb (83.9 kg)  10/05/19 190 lb (86.2 kg)  09/04/19 187 lb (84.8 kg)    Physical Exam Vitals and nursing note reviewed.  Pulmonary:     Effort: Pulmonary effort is normal. No respiratory distress.     Comments: Speaking in full sentences Neurological:     Mental Status: He is alert.  Psychiatric:        Mood and Affect: Mood normal.        Behavior: Behavior normal.        Thought Content: Thought content normal.        Judgment: Judgment normal.     Results for orders placed or performed in visit on 12/07/19  Bayer DCA Hb A1c Waived  Result Value Ref Range   HB A1C (BAYER DCA - WAIVED) 7.4 (H) <7.0 %      Assessment & Plan:   Problem List Items Addressed This Visit      Cardiovascular and  Mediastinum   Hypertensive heart failure (HCC)   Relevant Medications   metoprolol tartrate (LOPRESSOR) 50 MG tablet   lovastatin (MEVACOR) 20 MG tablet   lisinopril (ZESTRIL) 40 MG tablet   Essential hypertension    Not under good control- will increase to 40mg  and recheck 1 month. Call with any concerns. Continue to monitor.       Relevant Medications   metoprolol tartrate (LOPRESSOR) 50 MG tablet   lovastatin (MEVACOR) 20 MG tablet   lisinopril (ZESTRIL) 40 MG tablet     Endocrine   Controlled type 2 diabetes mellitus ([place])    Has not been taking his jardiance. Will restart it and recheck 1 month.       Relevant Medications   empagliflozin (JARDIANCE) 25 MG TABS tablet   metFORMIN (GLUCOPHAGE) 500 MG tablet   lovastatin (MEVACOR) 20 MG tablet   lisinopril (ZESTRIL) 40 MG tablet     Other   HLD (hyperlipidemia)   Relevant Medications   metoprolol tartrate (LOPRESSOR) 50 MG tablet   lovastatin (MEVACOR) 20 MG tablet   lisinopril (ZESTRIL) 40 MG tablet       Follow up plan: Return in about 4 weeks (around 03/07/2020) for DM and BP follow up.    . This visit was completed via telephone due to the restrictions of the COVID-19 pandemic. All issues as above were  discussed and addressed but no physical exam was performed. If it was felt that the patient should be evaluated in the office, they were directed there. The patient verbally consented to this visit. Patient was unable to complete an audio/visual visit due to Lack of equipment. Due to the catastrophic nature of the COVID-19 pandemic, this visit was done through audio contact only. . Location of the patient: home . Location of the provider: home . Those involved with this call:  . Provider: Olevia Perches, DO . CMA: Tiffany Reel, CMA . Front Desk/Registration: Adela Ports  . Time spent on call: 15 minutes on the phone discussing health concerns. 23 minutes total spent in review of patient's record and  preparation of their chart.

## 2020-02-29 ENCOUNTER — Encounter: Payer: Self-pay | Admitting: Family Medicine

## 2020-02-29 ENCOUNTER — Other Ambulatory Visit: Payer: Self-pay

## 2020-02-29 ENCOUNTER — Ambulatory Visit (INDEPENDENT_AMBULATORY_CARE_PROVIDER_SITE_OTHER): Payer: Medicare Other | Admitting: Family Medicine

## 2020-02-29 VITALS — BP 174/91 | HR 70 | Temp 98.2°F

## 2020-02-29 DIAGNOSIS — I25119 Atherosclerotic heart disease of native coronary artery with unspecified angina pectoris: Secondary | ICD-10-CM | POA: Diagnosis not present

## 2020-02-29 DIAGNOSIS — I110 Hypertensive heart disease with heart failure: Secondary | ICD-10-CM | POA: Diagnosis not present

## 2020-02-29 DIAGNOSIS — I10 Essential (primary) hypertension: Secondary | ICD-10-CM

## 2020-02-29 MED ORDER — METOPROLOL TARTRATE 50 MG PO TABS: 50.0000 mg | ORAL_TABLET | Freq: Every day | ORAL | 1 refills | Status: DC

## 2020-02-29 MED ORDER — AMLODIPINE BESYLATE 5 MG PO TABS: 5.0000 mg | ORAL_TABLET | Freq: Every day | ORAL | 3 refills | Status: DC

## 2020-02-29 NOTE — Progress Notes (Signed)
BP (!) 174/91 (BP Location: Left Arm, Cuff Size: Normal)   Pulse 70   Temp 98.2 F (36.8 C) (Oral)   SpO2 97%    Subjective:    Patient ID: Lucas Weiss, male    DOB: [birth year], 64 y.o.   MRN: [id number]  HPI: Lucas Weiss is a 64 y.o. male  Chief Complaint  Patient presents with  . Hypertension   HYPERTENSION Hypertension status: uncontrolled  Satisfied with current treatment? no Duration of hypertension: chronic BP monitoring frequency:  not checking BP medication side effects:  no Medication compliance: good compliance Previous BP meds:lisinopril, metoprolol Aspirin: no Recurrent headaches: no Visual changes: no Palpitations: no Dyspnea: no Chest pain: no Lower extremity edema: no Dizzy/lightheaded: no   Relevant past medical, surgical, family and social history reviewed and updated as indicated. Interim medical history since our last visit reviewed. Allergies and medications reviewed and updated.  Review of Systems  Constitutional: Negative.   Respiratory: Negative.   Cardiovascular: Negative.   Musculoskeletal: Negative.   Neurological: Negative.   Psychiatric/Behavioral: Negative.     Per HPI unless specifically indicated above     Objective:    BP (!) 174/91 (BP Location: Left Arm, Cuff Size: Normal)   Pulse 70   Temp 98.2 F (36.8 C) (Oral)   SpO2 97%   Wt Readings from Last 3 Encounters:  12/07/19 185 lb (83.9 kg)  10/05/19 190 lb (86.2 kg)  09/04/19 187 lb (84.8 kg)    Physical Exam Vitals and nursing note reviewed.  Constitutional:      General: He is not in acute distress.    Appearance: Normal appearance. He is not ill-appearing, toxic-appearing or diaphoretic.  HENT:     Head: Normocephalic and atraumatic.     Right Ear: External ear normal.     Left Ear: External ear normal.     Nose: Nose normal.     Mouth/Throat:     Mouth: Mucous membranes are moist.     Pharynx: Oropharynx is clear.  Eyes:     General: No scleral  icterus.       Right eye: No discharge.        Left eye: No discharge.     Extraocular Movements: Extraocular movements intact.     Conjunctiva/sclera: Conjunctivae normal.     Pupils: Pupils are equal, round, and reactive to light.  Cardiovascular:     Rate and Rhythm: Normal rate and regular rhythm.     Pulses: Normal pulses.     Heart sounds: Normal heart sounds. No murmur. No friction rub. No gallop.   Pulmonary:     Effort: Pulmonary effort is normal. No respiratory distress.     Breath sounds: Normal breath sounds. No stridor. No wheezing, rhonchi or rales.  Chest:     Chest wall: No tenderness.  Musculoskeletal:        General: Normal range of motion.     Cervical back: Normal range of motion and neck supple.  Skin:    General: Skin is warm and dry.     Capillary Refill: Capillary refill takes less than 2 seconds.     Coloration: Skin is not jaundiced or pale.     Findings: No bruising, erythema, lesion or rash.  Neurological:     General: No focal deficit present.     Mental Status: He is alert and oriented to person, place, and time. Mental status is at baseline.  Psychiatric:  Mood and Affect: Mood normal.        Behavior: Behavior normal.        Thought Content: Thought content normal.        Judgment: Judgment normal.     Results for orders placed or performed in visit on 12/07/19  Bayer DCA Hb A1c Waived  Result Value Ref Range   HB A1C (BAYER DCA - WAIVED) 7.4 (H) <7.0 %      Assessment & Plan:   Problem List Items Addressed This Visit      Cardiovascular and Mediastinum   Hypertensive heart failure (HCC)   Relevant Medications   metoprolol tartrate (LOPRESSOR) 50 MG tablet   amLODipine (NORVASC) 5 MG tablet   Essential hypertension - Primary    Not under good control. Will add amlodipine and recheck 2 months. Call with any concerns.       Relevant Medications   metoprolol tartrate (LOPRESSOR) 50 MG tablet   amLODipine (NORVASC) 5 MG tablet     Other Relevant Orders   Basic metabolic panel       Follow up plan: Return in about 2 months (around 04/30/2020).

## 2020-02-29 NOTE — Assessment & Plan Note (Signed)
Not under good control. Will add amlodipine and recheck 2 months. Call with any concerns.

## 2020-03-01 LAB — BASIC METABOLIC PANEL
BUN/Creatinine Ratio: 20 (ref 10–24)
BUN: 21 mg/dL (ref 8–27)
CO2: 19 mmol/L — ABNORMAL LOW (ref 20–29)
Calcium: 9.4 mg/dL (ref 8.6–10.2)
Chloride: 103 mmol/L (ref 96–106)
Creatinine, Ser: 1.07 mg/dL (ref 0.76–1.27)
GFR calc Af Amer: 86 mL/min/{1.73_m2} (ref >59)
GFR calc non Af Amer: 75 mL/min/{1.73_m2} (ref >59)
Glucose: 204 mg/dL — ABNORMAL HIGH (ref 65–99)
Potassium: 4.5 mmol/L (ref 3.5–5.2)
Sodium: 137 mmol/L (ref 134–144)

## 2020-03-04 ENCOUNTER — Encounter: Payer: Self-pay | Admitting: Family Medicine

## 2020-03-06 ENCOUNTER — Telehealth: Payer: Self-pay | Admitting: Family Medicine

## 2020-03-06 NOTE — Telephone Encounter (Signed)
° °  KNB 03/06/2020 1st attempt   Name: Lucas Weiss    MRN: [id number]    DOB: Apr 23, 1956    AGE: 64 y.o.    GENDER: male    PCP Olevia Perches P, DO.   Called pt regarding Community Resource Referral for financial help LMTCB Follow up on: 3/18 Asked patient if he needs financial support with "food, utilities, or rent" and he said "yes, all of those things". Meets income requirements for Medicare Extra Help, but told me that he does have some property that puts him over the asset requirement. Anything you can do to help would be appreciated. Thanks!  Manuela Schwartz  Care Guide  Embedded Care Coordination [facility] Management [email or web address]   [phone]

## 2020-03-07 NOTE — Telephone Encounter (Signed)
   KNB 03/07/2020 2nd Attempt  Name: Lucas Weiss   MRN: [id number]   DOB: 08-23-1956   AGE: 64 y.o.   GENDER: male   PCP Olevia Perches P, DO.   Called pt regarding [employer] Referral for rent, utility and food assistance. Pt stated that he was not feeling too good at the moment and asked that I call him back later. Follow up on: 3/19  [facility]  Care Guide . Embedded Care Coordination [facility] Management [email or web address]  [phone]

## 2020-03-08 NOTE — Telephone Encounter (Signed)
   KNB 03/08/2020 3rd Attempt  Name: Lucas Weiss   MRN: [id number]   DOB: 12/16/1956   AGE: 64 y.o.   GENDER: male   PCP Olevia Perches P, DO.   Called pt regarding [employer] Referral for financial help with rent, utility, and food assistance. LM Closing referral pending any other needs of patient.   Manuela Schwartz  Care Guide . Embedded Care Coordination [facility] Management [email or web address]  [phone]

## 2020-03-13 ENCOUNTER — Telehealth: Payer: Self-pay | Admitting: Family Medicine

## 2020-03-13 NOTE — Telephone Encounter (Signed)
   KNB 03/13/2020  Name: Lucas Weiss   MRN: [id number]   DOB: December 07, 1956   AGE: 64 y.o.   GENDER: male   PCP Olevia Perches P, DO.   Called pt regarding Community Resource Referral for financial assistance pt asked that I call back on 3/25 in afternoon follow up on: 03/14/20  [facility]  Care Guide . Embedded Care Coordination [facility] Management [email or web address]  [phone]

## 2020-03-15 NOTE — Telephone Encounter (Signed)
   KNB 03/15/2020  Name: Lucas Weiss   MRN: [id number]   DOB: [birth year]   AGE: 64 y.o.   GENDER: male   PCP Olevia Perches P, DO.   Called pt regarding Community Resource Referral for financial assistance pt was walking out the door asked that I try him back again on Monday afternoon Follow up on: 03/18/20  [facility]  Care Guide . Embedded Care Coordination [facility] Management [email or web address]  [phone]

## 2020-03-19 NOTE — Telephone Encounter (Signed)
   KNB 03/19/2020  Name: CAGE GUPTON   MRN: [id number]   DOB: 28-Dec-1955   AGE: 64 y.o.   GENDER: male   PCP Olevia Perches P, DO.   Called pt regarding [employer] Referral for financial assistance. Patient stated that he was not eligible for Medicaid as he had stock that he had been saving for his granddaughter who is going to college this fall. I asked him if he could put the stock into her name and he said that he wanted it to be in both of there names so that he could wait until she was older for her to have the money. Will send him information on Medicaid Spend Down program. Pt stated that his landlord Mr. Benna Dunks is unavailable and that he mails the money to an address for his lot rent each month but he does not have phone # or contact info for me to get the w9. Suggested paying his electric bill and he stated he had just paid the march bill.   Pt took down my phone number, email,  and name and I advised that he have his granddaughter take a picture of his April [employer] bill and email it to me so that he does not have to make a special trip to [place] to drop off his statement. Once I receive it I will submit to [facility] for assistance.    Manuela Schwartz  Care Guide . Embedded Care Coordination [facility] Management [email or web address]  [phone]

## 2020-03-21 ENCOUNTER — Telehealth: Payer: Self-pay | Admitting: Family Medicine

## 2020-03-21 NOTE — Telephone Encounter (Signed)
   KNB 03/21/2020 Name: Lucas Weiss   MRN: [id number]   DOB: 1956/05/07   AGE: 64 y.o.   GENDER: male   PCP Olevia Perches P, DO.   Returned phone call from daughter with energy bill information and confirming my email, she said she would send over the statement that she had available. Follow up on: 03/25/20  Manuela Schwartz  Care Guide . Embedded Care Coordination [facility] Management [email or web address]  [phone]

## 2020-03-21 NOTE — Telephone Encounter (Signed)
Email to pt's daughter  From: Manuela Schwartz [facility])  Sent: Thursday, March 21, 2020 12:53 PM To: 'Doughten, Amber' [email or web address]> Subject: RE: Yuto Cajuste, This is the one that Mr. Goodley stated he had already paid. Will you please send me the April statement once it is available?   Manuela Schwartz  Care Guide . Embedded Care Coordination [facility] Management [email or web address]  [phone]     From: Vergie Living [email or web address]>  Sent: Thursday, March 21, 2020 12:49 PM To: Manuela Schwartz [facility]) [email or web address]> Subject: [employer] [place] Utility bill   This was his March bill, he hasn't received April yet.  Get Outlook for iOS

## 2020-03-27 NOTE — Telephone Encounter (Signed)
   KNB 03/27/2020 Name: Lucas Weiss   MRN: [id number]   DOB: 1956/05/11   AGE: 64 y.o.   GENDER: male   PCP Olevia Perches P, DO.   Called pt regarding Community Resource Referral for financial assistance followed up to complete [facility] application Care Guide to submit to intake coordinator.   Manuela Schwartz  Care Guide . Embedded Care Coordination [facility] Management [email or web address]  [phone]

## 2020-04-01 NOTE — Telephone Encounter (Signed)
   K[facility] 04/01/2020  Name: Lucas Weiss   MRN: [id number]   DOB: 1956-09-24   AGE: 64 y.o.   GENDER: male   PCP Olevia Perches P, DO.   Called pt regarding [employer] Referral for [employer] for [employer], notified [name] that his bill has been submitted for payment  Closing referral pending any other needs of patient.  Manuela Schwartz  Care Guide . Embedded Care Coordination [facility] Management [email or web address]  [phone]

## 2020-04-30 ENCOUNTER — Ambulatory Visit: Payer: Medicare Other | Admitting: Family Medicine

## 2020-06-25 ENCOUNTER — Other Ambulatory Visit: Payer: Self-pay | Admitting: Family Medicine

## 2020-08-30 ENCOUNTER — Ambulatory Visit (INDEPENDENT_AMBULATORY_CARE_PROVIDER_SITE_OTHER): Payer: Medicare Other

## 2020-08-30 VITALS — BP 157/80 | HR 74 | Ht 71.0 in | Wt 185.0 lb

## 2020-08-30 DIAGNOSIS — Z0000 Encounter for general adult medical examination without abnormal findings: Secondary | ICD-10-CM | POA: Diagnosis not present

## 2020-08-30 NOTE — Progress Notes (Signed)
I connected with Lucas Weiss today by telephone and verified that I am speaking with the correct person using two identifiers. Location patient: home Location provider: work Persons participating in the virtual visit: Lucas Weiss, Lucas Weiss.   I discussed the limitations, risks, security and privacy concerns of performing an evaluation and management service by telephone and the availability of in person appointments. I also discussed with the patient that there may be a patient responsible charge related to this service. The patient expressed understanding and verbally consented to this telephonic visit.    Interactive audio and video telecommunications were attempted between this provider and patient, however failed, due to patient having technical difficulties OR patient did not have access to video capability.  We continued and completed visit with audio only.     Vital signs may be patient reported or missing.  Subjective:   Lucas Weiss is a 64 y.o. male who presents for Medicare Annual/Subsequent preventive examination.  Review of Systems     Cardiac Risk Factors include: advanced age (>4men, >20 women);diabetes mellitus;hypertension;dyslipidemia;male gender;sedentary lifestyle     Objective:    Today's Vitals   08/30/20 1511  Weight: 185 lb (83.9 kg)  Height:  (1.803 m)   Body mass index is 25.8 kg/m.  Advanced Directives 08/30/2020 07/31/2019 07/25/2018 07/21/2017 05/12/2016 11/11/2015 09/15/2012  Does Patient Have a Medical Advance Directive? No No No No No No Patient does not have advance directive;Patient would like information  Would patient like information on creating a medical advance directive? - - Yes (MAU/Ambulatory/Procedural Areas - Information given) Yes (MAU/Ambulatory/Procedural Areas - Information given) Yes - Educational materials given - Referral made to social work  Pre-existing out of facility DNR order (yellow form or pink MOST form) - - - - -  - -    Current Medications (verified) Outpatient Encounter Medications as of 08/30/2020  Medication Sig  . amLODipine (NORVASC) 5 MG tablet TAKE ONE TABLET BY MOUTH DAILY  . aspirin EC 81 MG tablet Take 81 mg by mouth daily.  . empagliflozin (JARDIANCE) 25 MG TABS tablet Take 25 mg by mouth daily before breakfast.  . lisinopril (ZESTRIL) 40 MG tablet Take 1 tablet (40 mg total) by mouth daily.  Marland Kitchen lovastatin (MEVACOR) 20 MG tablet TAKE TWO TABLETS BY MOUTH EVERY NIGHT AT BEDTIME.  . metFORMIN (GLUCOPHAGE) 500 MG tablet TAKE TWO TABLETS BY MOUTH TWICE A DAY WITH FOOD  . metoprolol tartrate (LOPRESSOR) 50 MG tablet Take 1 tablet (50 mg total) by mouth daily.  . nitroGLYCERIN (NITROSTAT) 0.4 MG SL tablet Place 1 tablet (0.4 mg total) under the tongue every 5 (five) minutes as needed for chest pain.  Marland Kitchen QUEtiapine (SEROQUEL) 25 MG tablet Take 1 tablet (25 mg total) by mouth at bedtime. (Patient not taking: Reported on 02/08/2020)   No facility-administered encounter medications on file as of 08/30/2020.    Allergies (verified) Patient has no known allergies.   History: Past Medical History:  Diagnosis Date  . Alcohol abuse, in remission   . Avascular necrosis of bone of left hip (HCC) 10/15   Total hip replacement recommended by ortho, vascular says it's OK  . Avascular necrosis of bone of left hip (HCC)    Total hip replacement recommended by ortho, vascular says he can have it done.    . Bleeding hemorrhoids   . CHF (congestive heart failure) (HCC)   . Chronic low back pain   . Complication of anesthesia    "woke up  twice during procedures @ [facility]" (09/15/2012)  . Coronary artery disease   . Diabetes mellitus without complication (HCC) 01/09/16   A1c 6.8  . Diverticulosis of sigmoid colon   . DVT (deep venous thrombosis) (HCC)   . Dyslexia   . H/O hiatal hernia   . Head injury, acute, with loss of consciousness (HCC)    as a child  . High cholesterol   . Hypertension   .  Impaired fasting glucose    Last A1c 6.1  . Myocardial infarction (HCC)   . Neuropathy   . PAD (peripheral artery disease) (HCC)    07/2012:  right ABI 0.64, left ABI 0.57.  Marland Kitchen Pneumonia    history of   . Primary osteoarthritis of left hip   . Renal artery stenosis (HCC)    Bilateral   . Severe recurrent major depression without psychotic features (HCC)   . Shortness of breath dyspnea    with exertion - due to pain  . Stroke [facility]) 2009   "after CABG; ~ blind left eye since" (09/15/2012)- memory loss   . Tobacco abuse    Quit in 2009   Past Surgical History:  Procedure Laterality Date  . 2D Echo  02/17/11   EF 50-55%, mild LVH, No WMA, mild MR, mild TR  . CARDIAC CATHETERIZATION  ?2009  . CARDIOVASCULAR STRESS TEST  08/10/12   Lexiscan: EF 70%, Attenuation artifact in inferior region.  No ischemia or infarct in remaining myocardium. Low risk  . COLONOSCOPY W/ POLYPECTOMY     "no cancer"  . CORONARY ARTERY BYPASS GRAFT  2009   2009, CABG X5, LIMA to LAD, SVG to Diagnonal, Sequential SVG to OM 2-3, SVG to PDA; Dr. Laneta Simmers  . INSERTION OF ILIAC STENT Left 09/15/2012   Procedure: INSERTION OF ILIAC STENT;  Surgeon: [name], MD;  Location: [facility] C[facility] LAB;  Service: Cardiovascular;  Laterality: Left;  lt iliac stent  . LOWER EXTREMITY ANGIOGRAM N/A 09/15/2012   Procedure: LOWER EXTREMITY ANGIOGRAM;  Surgeon: [name], MD;  Location: [facility] CATH LAB;  Service: Cardiovascular;  Laterality: N/A;  . PERIPHERAL ARTERIAL STENT GRAFT Left 06/20/2012; ~ 07/2012; 09/15/2012   left; left; left  . PV angiogram  09/15/12   restenting of left common iliac for ISRS.  Bilateral occluded SFAs.  Renal artery stenosis: 70% right inferior renal artery, 90% left inferior renal artery stenosis.   Marland Kitchen TOTAL HIP ARTHROPLASTY Left 05/19/2016   Procedure: LEFT TOTAL HIP ARTHROPLASTY ANTERIOR APPROACH;  Surgeon: Kathryne Hitch, MD;  Location: [facility] OR;  Service: Orthopedics;  Laterality: Left;   Family  History  Problem Relation Age of Onset  . Diabetes Mother   . Heart disease Mother   . Hyperlipidemia Mother   . Hypertension Mother   . Heart attack Mother   . Thyroid disease Mother   . Hyperlipidemia Father   . Hypertension Father   . Heart attack Father   . Heart disease Father        before age 51  . Alcohol abuse Father   . Lung disease Father   . Diabetes Sister   . Hyperlipidemia Sister   . Hypertension Sister   . Cancer Sister   . Heart disease Brother        before age 40  . Hyperlipidemia Brother   . Hypertension Brother   . Heart attack Brother   . Diabetes Brother   . Heart disease Brother   . Hyperlipidemia Brother   .  Hypertension Brother   . Stroke Brother   . Heart attack Brother   . Heart disease Sister   . Hypertension Sister   . Hyperlipidemia Sister    Social History   Socioeconomic History  . Marital status: Divorced    Spouse name: Not on file  . Number of children: Not on file  . Years of education: Not on file  . Highest education level: 11th grade  Occupational History  . Occupation: disability  Tobacco Use  . Smoking status: Former Smoker    Packs/day: 1.50    Years: 30.00    Pack years: 45.00    Types: Cigarettes    Quit date: 07/19/2008    Years since quitting: 12.1  . Smokeless tobacco: Never Used  Vaping Use  . Vaping Use: Never used  Substance and Sexual Activity  . Alcohol use: No    Comment: Former Alcoholic, not drinking anymore. 09/15/2012 "3-4 beers; shot of crown @ least 2 nights/wk"  . Drug use: No  . Sexual activity: Not Currently  Other Topics Concern  . Not on file  Social History Narrative  . Not on file   Social Determinants of Health   Financial Resource Strain: Low Risk   . Difficulty of Paying Living Expenses: Not hard at all  Food Insecurity: No Food Insecurity  . Worried About Programme researcher, broadcasting/film/video in the Last Year: Never true  . Ran Out of Food in the Last Year: Never true  Transportation Needs: No  Transportation Needs  . Lack of Transportation (Medical): No  . Lack of Transportation (Non-Medical): No  Physical Activity: Inactive  . Days of Exercise per Week: 0 days  . Minutes of Exercise per Session: 0 min  Stress: No Stress Concern Present  . Feeling of Stress : Not at all  Social Connections:   . Frequency of Communication with Friends and Family: Not on file  . Frequency of Social Gatherings with Friends and Family: Not on file  . Attends Religious Services: Not on file  . Active Member of Clubs or Organizations: Not on file  . Attends Banker Meetings: Not on file  . Marital Status: Not on file    Tobacco Counseling Counseling given: Not Answered   Clinical Intake:  Pre-visit preparation completed: Yes  Pain : No/denies pain     Nutritional Status: BMI 25 -29 Overweight Nutritional Risks: Nausea/ vomitting/ diarrhea (some diarrhea depends on what he eats) Diabetes: Yes  How often do you need to have someone help you when you read instructions, pamphlets, or other written materials from your doctor or pharmacy?: 1 - Never What is the last grade level you completed in school?: 11th grade  Diabetic? Yes Nutrition Risk Assessment:  Has the patient had any N/V/D within the last 2 months?  No  Does the patient have any non-healing wounds?  No  Has the patient had any unintentional weight loss or weight gain?  No   Diabetes:  Is the patient diabetic?  Yes  If diabetic, was a CBG obtained today?  No  Did the patient bring in their glucometer from home?  No  How often do you monitor your CBG's? doesn't.   Financial Strains and Diabetes Management:  Are you having any financial strains with the device, your supplies or your medication? Yes .  Does the patient want to be seen by Chronic Care Management for management of their diabetes?  Yes  Would the patient like to be referred  to a Nutritionist or for Diabetic Management?  No   Diabetic  Exams:  Diabetic Eye Exam: Overdue for diabetic eye exam. Pt has been advised about the importance in completing this exam. Patient advised to call and schedule an eye exam. Diabetic Foot Exam: Overdue, Pt has been advised about the importance in completing this exam. Pt is scheduled for diabetic foot exam on next appointment.   Interpreter Needed?: No  Information entered by :: NAllen Weiss   Activities of Daily Living In your present state of health, do you have any difficulty performing the following activities: 08/30/2020  Hearing? N  Comment hearing aide  Vision? Y  Comment blind in left eye  Difficulty concentrating or making decisions? Y  Comment sometimes with memory  Walking or climbing stairs? N  Dressing or bathing? N  Doing errands, shopping? N  Preparing Food and eating ? N  Using the Toilet? N  In the past six months, have you accidently leaked urine? Y  Comment if held too long  Do you have problems with loss of bowel control? N  Managing your Medications? N  Managing your Finances? N  Housekeeping or managing your Housekeeping? N  Some recent data might be hidden    Patient Care Team: Dorcas CarrowJohnson, Megan P, DO as PCP - General (Family Medicine) Kathryne HitchBlackman, Christopher Y, MD as Consulting Physician (Orthopedic Surgery)  Indicate any recent Medical Services you may have received from other than [facility] providers in the past year (date may be approximate).     Assessment:   This is a routine wellness examination for Viviann SpareSteven.  Hearing/Vision screen  Hearing Screening   125Hz  250Hz  500Hz  1000Hz  2000Hz  3000Hz  4000Hz  6000Hz  8000Hz   Right ear:           Left ear:           Vision Screening Comments: No regular eye exams,  Dietary issues and exercise activities discussed: Current Exercise Habits: The patient does not participate in regular exercise at present  Goals    .  DIET - INCREASE WATER INTAKE      Recommend drinking at least 6-8 glasses of water a day     .   Increase water intake      Recommend drinking at least 4-5 glasses of water a day     .  Patient Stated      [place]    .  PharmD "I want to take care of myself" (pt-stated)      Current Barriers:  . Diabetes: uncontrolled; complicated by chronic medical conditions including CAD (s/p CABG 2009, reports CVA), PAD (s/p iliac stenting), renal artery stenosis, HTN, depression, most recent A1c 7.4% o Notes that he has a difficult time financially, as well as with some cognitive deficits s/p CVA. Many of these things coincided w/ divorce of his wife and losing property o Does NOT have prescription drug insurance. Notes [place] PepperJardiance is a few hundred dollars . Current antihyperglycemic regimen: metformin 1000 mg BID, Jardiance 25 mg daily  . Cardiovascular risk reduction: o Current hypertensive regimen: lisinopril 20 mg daily, metoprolol tartrate 50 mg daily  o Current hyperlipidemia regimen: lovastatin 40 mg daily, LDL not at goal <70 o Current antiplatelet regimen: ASA 81 mg  . Depression/insomnia:  o Quetiapine 25 mg QPM  Pharmacist Clinical Goal(s):  Marland Kitchen. Over the next 90 days, patient will work with PharmD and primary care provider to address optimizing medication management  Interventions: . Comprehensive medication review  performed, medication list updated in electronic medical record . Discussed financial concerns. Placed Care Guide referral.  . Discussed Medicare Extra Help. Patient would qualify due to his income, but notes that he has some rental property that would put him over the asset limit. Discussed application for Jardiance assistance for BI. Patient will qualify; we may have to show them proof of copay for Jardiance eventually to show that he is not eligible for Medicare Extra Help. Will collaborate w/ Pattricia Boss, CPhT to mail patient his portion of application . Metoprolol tartrate should be dosed twice daily. Will f/u w/ PCP about this prior to next  appointment. . Per chart review, patient had previously been on atorvastatin and rosuvastatin. Unclear of why these were discontinued. Will evaluate moving forward, as patient should be on high intensity statin for secondary prevention  Patient Self Care Activities:  . Patient will check blood glucose daily, document, and provide at future appointments . Patient will take medications as prescribed . Patient will report any questions or concerns to provider   Initial goal documentation       Depression Screen PHQ 2/9 Scores 08/30/2020 02/08/2020 01/09/2020 12/07/2019 07/31/2019 02/14/2019 07/25/2018  PHQ - 2 Score - - 0 - 0 0 0  PHQ- 9 Score - - - - - - -  Exception Documentation Patient refusal Patient refusal Patient refusal Patient refusal - Patient refusal -    Fall Risk Fall Risk  08/30/2020 07/31/2019 02/14/2019 07/25/2018 07/21/2017  Falls in the past year? 0 0 0 No No  Number falls in past yr: - - - - -  Injury with Fall? - - - - -  Risk for fall due to : Medication side effect - - - -  Follow up Falls evaluation completed;Education provided;Falls prevention discussed - Falls evaluation completed - -    Any stairs in or around the home? No  If so, are there any without handrails? n/a Home free of loose throw rugs in walkways, pet beds, electrical cords, etc? Yes  Adequate lighting in your home to reduce risk of falls? Yes   ASSISTIVE DEVICES UTILIZED TO PREVENT FALLS:  Life alert? No  Use of a cane, walker or w/c? No  Grab bars in the bathroom? No  Shower chair or bench in shower? No  Elevated toilet seat or a handicapped toilet? No   TIMED UP AND GO:  Was the test performed? No . .    Cognitive Function:     6CIT Screen 07/25/2018 07/21/2017  What Year? 0 points 0 points  What month? 0 points 0 points  What time? 0 points 0 points  Count back from 20 0 points 0 points  Months in reverse 0 points 0 points  Repeat phrase 6 points 2 points  Total Score 6 2     Immunizations Immunization History  Administered Date(s) Administered  . Influenza Split 09/16/2012  . Influenza,inj,Quad PF,6+ Mos 09/06/2015, 09/14/2016, 09/09/2017, 10/17/2018, 09/04/2019  . Influenza-Unspecified 09/12/2014  . Pneumococcal Polysaccharide-23 05/28/2010, 05/20/2016  . Tdap 05/28/2010    TDAP status: Due, Education has been provided regarding the importance of this vaccine. Advised may receive this vaccine at local pharmacy or Health Dept. Aware to provide a copy of the vaccination record if obtained from local pharmacy or Health Dept. Verbalized acceptance and understanding. Flu Vaccine status: Up to date Pneumococcal vaccine status: Up to date Covid-19 vaccine status: Information provided on how to obtain vaccines.   Qualifies for Shingles Vaccine? Yes  Zostavax completed No   Shingrix Completed?: No.    Education has been provided regarding the importance of this vaccine. Patient has been advised to call insurance company to determine out of pocket expense if they have not yet received this vaccine. Advised may also receive vaccine at local pharmacy or Health Dept. Verbalized acceptance and understanding.  Screening Tests Health Maintenance  Topic Date Due  . OPHTHALMOLOGY EXAM  Never done  . COVID-19 Vaccine (1) Never done  . COLON CANCER SCREENING ANNUAL FOBT  02/05/2017  . FOOT EXAM  02/15/2020  . TETANUS/TDAP  05/28/2020  . HEMOGLOBIN A1C  06/06/2020  . INFLUENZA VACCINE  07/21/2020  . COLONOSCOPY  02/05/2026 (Originally 02/05/2017)  . PNEUMOCOCCAL POLYSACCHARIDE VACCINE AGE 88-64 HIGH RISK  Completed  . Hepatitis C Screening  Completed  . HIV Screening  Completed    Health Maintenance  Health Maintenance Due  Topic Date Due  . OPHTHALMOLOGY EXAM  Never done  . COVID-19 Vaccine (1) Never done  . COLON CANCER SCREENING ANNUAL FOBT  02/05/2017  . FOOT EXAM  02/15/2020  . TETANUS/TDAP  05/28/2020  . HEMOGLOBIN A1C  06/06/2020  . INFLUENZA VACCINE   07/21/2020    Colorectal cancer screening: Due  Lung Cancer Screening: (Low Dose CT Chest recommended if Age 64-80 years, 30 pack-year currently smoking OR have quit w/in 15years.) does not qualify.   Lung Cancer Screening Referral: no  Additional Screening:  Hepatitis C Screening: does qualify; Completed 10/08/2015  Vision Screening: Recommended annual ophthalmology exams for early detection of glaucoma and other disorders of the eye. Is the patient up to date with their annual eye exam?  No  Who is the provider or what is the name of the office in which the patient attends annual eye exams? none If pt is not established with a provider, would they like to be referred to a provider to establish care? No .   Dental Screening: Recommended annual dental exams for proper oral hygiene  Community Resource Referral / Chronic Care Management: CRR required this visit?  No   CCM required this visit?  Yes      Plan:     I have personally reviewed and noted the following in the patient's chart:   . Medical and social history . Use of alcohol, tobacco or illicit drugs  . Current medications and supplements . Functional ability and status . Nutritional status . Physical activity . Advanced directives . List of other physicians . Hospitalizations, surgeries, and ER visits in previous 12 months . Vitals . Screenings to include cognitive, depression, and falls . Referrals and appointments  In addition, I have reviewed and discussed with patient certain preventive protocols, quality metrics, and best practice recommendations. A written personalized care plan for preventive services as well as general preventive health recommendations were provided to patient.     Barb Merino, Weiss   1/96/2229   Nurse Notes: 6 CIT and depression screen not performed. Patient declined.

## 2020-08-30 NOTE — Patient Instructions (Signed)
Lucas Weiss , Thank you for taking time to come for your Medicare Wellness Visit. I appreciate your ongoing commitment to your health goals. Please review the following plan we discussed and let me know if I can assist you in the future.   Screening recommendations/referrals: Colonoscopy: due Recommended yearly ophthalmology/optometry visit for glaucoma screening and checkup Recommended yearly dental visit for hygiene and checkup  Vaccinations: Influenza vaccine: due Pneumococcal vaccine: completed 05/20/2016 Tdap vaccine: due Shingles vaccine: discussed   Covid-19: thinking about it  Advanced directives: Advance directive discussed with you today.    Conditions/risks identified: none  Next appointment: Follow up in one year for your annual wellness visit   Preventive Care 40-64 Years, Male Preventive care refers to lifestyle choices and visits with your health care provider that can promote health and wellness. What does preventive care include?  A yearly physical exam. This is also called an annual well check.  Dental exams once or twice a year.  Routine eye exams. Ask your health care provider how often you should have your eyes checked.  Personal lifestyle choices, including:  Daily care of your teeth and gums.  Regular physical activity.  Eating a healthy diet.  Avoiding tobacco and drug use.  Limiting alcohol use.  Practicing safe sex.  Taking low-dose aspirin every day starting at age 46. What happens during an annual well check? The services and screenings done by your health care provider during your annual well check will depend on your age, overall health, lifestyle risk factors, and family history of disease. Counseling  Your health care provider may ask you questions about your:  Alcohol use.  Tobacco use.  Drug use.  Emotional well-being.  Home and relationship well-being.  Sexual activity.  Eating habits.  Work and work  Astronomer. Screening  You may have the following tests or measurements:  Height, weight, and BMI.  Blood pressure.  Lipid and cholesterol levels. These may be checked every 5 years, or more frequently if you are over 54 years old.  Skin check.  Lung cancer screening. You may have this screening every year starting at age 10 if you have a 30-pack-year history of smoking and currently smoke or have quit within the past 15 years.  Fecal occult blood test (FOBT) of the stool. You may have this test every year starting at age 74.  Flexible sigmoidoscopy or colonoscopy. You may have a sigmoidoscopy every 5 years or a colonoscopy every 10 years starting at age 63.  Prostate cancer screening. Recommendations will vary depending on your family history and other risks.  Hepatitis C blood test.  Hepatitis B blood test.  Sexually transmitted disease (STD) testing.  Diabetes screening. This is done by checking your blood sugar (glucose) after you have not eaten for a while (fasting). You may have this done every 1-3 years. Discuss your test results, treatment options, and if necessary, the need for more tests with your health care provider. Vaccines  Your health care provider may recommend certain vaccines, such as:  Influenza vaccine. This is recommended every year.  Tetanus, diphtheria, and acellular pertussis (Tdap, Td) vaccine. You may need a Td booster every 10 years.  Zoster vaccine. You may need this after age 75.  Pneumococcal 13-valent conjugate (PCV13) vaccine. You may need this if you have certain conditions and have not been vaccinated.  Pneumococcal polysaccharide (PPSV23) vaccine. You may need one or two doses if you smoke cigarettes or if you have certain conditions. Talk to your  health care provider about which screenings and vaccines you need and how often you need them. This information is not intended to replace advice given to you by your health care provider. Make  sure you discuss any questions you have with your health care provider. Document Released: 01/03/2016 Document Revised: 08/26/2016 Document Reviewed: 10/08/2015 Elsevier Interactive Patient Education  2017 [place] Prevention in the Home Falls can cause injuries. They can happen to people of all ages. There are many things you can do to make your home safe and to help prevent falls. What can I do on the outside of my home?  Regularly fix the edges of walkways and driveways and fix any cracks.  Remove anything that might make you trip as you walk through a door, such as a raised step or threshold.  Trim any bushes or trees on the path to your home.  Use bright outdoor lighting.  Clear any walking paths of anything that might make someone trip, such as rocks or tools.  Regularly check to see if handrails are loose or broken. Make sure that both sides of any steps have handrails.  Any raised decks and porches should have guardrails on the edges.  Have any leaves, snow, or ice cleared regularly.  Use sand or salt on walking paths during winter.  Clean up any spills in your garage right away. This includes oil or grease spills. What can I do in the bathroom?  Use night lights.  Install grab bars by the toilet and in the tub and shower. Do not use towel bars as grab bars.  Use non-skid mats or decals in the tub or shower.  If you need to sit down in the shower, use a plastic, non-slip stool.  Keep the floor dry. Clean up any water that spills on the floor as soon as it happens.  Remove soap buildup in the tub or shower regularly.  Attach bath mats securely with double-sided non-slip rug tape.  Do not have throw rugs and other things on the floor that can make you trip. What can I do in the bedroom?  Use night lights.  Make sure that you have a light by your bed that is easy to reach.  Do not use any sheets or blankets that are too big for your bed. They should  not hang down onto the floor.  Have a firm chair that has side arms. You can use this for support while you get dressed.  Do not have throw rugs and other things on the floor that can make you trip. What can I do in the kitchen?  Clean up any spills right away.  Avoid walking on wet floors.  Keep items that you use a lot in easy-to-reach places.  If you need to reach something above you, use a strong step stool that has a grab bar.  Keep electrical cords out of the way.  Do not use floor polish or wax that makes floors slippery. If you must use wax, use non-skid floor wax.  Do not have throw rugs and other things on the floor that can make you trip. What can I do with my stairs?  Do not leave any items on the stairs.  Make sure that there are handrails on both sides of the stairs and use them. Fix handrails that are broken or loose. Make sure that handrails are as long as the stairways.  Check any carpeting to make sure that it is  firmly attached to the stairs. Fix any carpet that is loose or worn.  Avoid having throw rugs at the top or bottom of the stairs. If you do have throw rugs, attach them to the floor with carpet tape.  Make sure that you have a light switch at the top of the stairs and the bottom of the stairs. If you do not have them, ask someone to add them for you. What else can I do to help prevent falls?  Wear shoes that:  Do not have high heels.  Have rubber bottoms.  Are comfortable and fit you well.  Are closed at the toe. Do not wear sandals.  If you use a stepladder:  Make sure that it is fully opened. Do not climb a closed stepladder.  Make sure that both sides of the stepladder are locked into place.  Ask someone to hold it for you, if possible.  Clearly mark and make sure that you can see:  Any grab bars or handrails.  First and last steps.  Where the edge of each step is.  Use tools that help you move around (mobility aids) if they are  needed. These include:  Canes.  Walkers.  Scooters.  Crutches.  Turn on the lights when you go into a dark area. Replace any light bulbs as soon as they burn out.  Set up your furniture so you have a clear path. Avoid moving your furniture around.  If any of your floors are uneven, fix them.  If there are any pets around you, be aware of where they are.  Review your medicines with your doctor. Some medicines can make you feel dizzy. This can increase your chance of falling. Ask your doctor what other things that you can do to help prevent falls. This information is not intended to replace advice given to you by your health care provider. Make sure you discuss any questions you have with your health care provider. Document Released: 10/03/2009 Document Revised: 05/14/2016 Document Reviewed: 01/11/2015 Elsevier Interactive Patient Education  2017 [employer].

## 2020-09-22 ENCOUNTER — Other Ambulatory Visit: Payer: Self-pay | Admitting: Family Medicine

## 2020-09-22 DIAGNOSIS — E782 Mixed hyperlipidemia: Secondary | ICD-10-CM

## 2020-09-22 NOTE — Telephone Encounter (Signed)
Requested medication (s) are due for refill today: yes  Requested medication (s) are on the active medication list: yes  Last refill:  02/08/20  Future visit scheduled: no  Notes to clinic:  overdue labs   Requested Prescriptions  Pending Prescriptions Disp Refills   lovastatin (MEVACOR) 20 MG tablet [Pharmacy Med Name: LOVASTATIN 20 MG TABLET] 180 tablet 1    Sig: TAKE TWO TABLETS BY MOUTH EVERY NIGHT AT BEDTIME.      Cardiovascular:  Antilipid - Statins Failed - 09/22/2020  3:37 PM      Failed - Total Cholesterol in normal range and within 360 days    Cholesterol, Total  Date Value Ref Range Status  09/04/2019 201 (H) 100 - 199 mg/dL Final          Failed - LDL in normal range and within 360 days    LDL Chol Calc (NIH)  Date Value Ref Range Status  09/04/2019 100 (H) 0 - 99 mg/dL Final          Failed - HDL in normal range and within 360 days    HDL  Date Value Ref Range Status  09/04/2019 37 (L) >39 mg/dL Final          Failed - Triglycerides in normal range and within 360 days    Triglycerides  Date Value Ref Range Status  09/04/2019 377 (H) 0 - 149 mg/dL Final          Passed - Patient is not pregnant      Passed - Valid encounter within last 12 months    Recent Outpatient Visits           6 months ago Essential hypertension   [facility] [place], Megan P, DO   7 months ago Mixed hyperlipidemia   [facility], Megan P, DO   8 months ago Essential hypertension   [facility] [place], Megan P, DO   9 months ago Controlled type 2 diabetes mellitus with microalbuminuria, without long-term current use of insulin (HCC)   [facility] [place], Megan P, DO   11 months ago Chronic pain syndrome   [facility] [place], Megan P, DO       Future Appointments             In 11 months [facility], [facility]

## 2020-09-23 ENCOUNTER — Other Ambulatory Visit: Payer: Self-pay | Admitting: Family Medicine

## 2020-09-23 DIAGNOSIS — E782 Mixed hyperlipidemia: Secondary | ICD-10-CM

## 2020-09-23 DIAGNOSIS — I110 Hypertensive heart disease with heart failure: Secondary | ICD-10-CM

## 2020-09-23 MED ORDER — AMLODIPINE BESYLATE 5 MG PO TABS
5.0000 mg | ORAL_TABLET | Freq: Every day | ORAL | 1 refills | Status: DC
Start: 2020-09-23 — End: 2021-03-27

## 2020-09-23 MED ORDER — METOPROLOL TARTRATE 50 MG PO TABS: 50.0000 mg | ORAL_TABLET | Freq: Every day | ORAL | 1 refills | Status: DC

## 2020-09-23 MED ORDER — METFORMIN HCL 500 MG PO TABS: ORAL_TABLET | ORAL | 1 refills | Status: DC

## 2020-09-23 NOTE — Telephone Encounter (Signed)
Medication Refill - Medication: Lovastin 20 mg, Metoprolol 50 mg ,  Amlodipine 5 mg,  Metformin  Has the patient contacted their pharmacy? No. (Agent: If no, request that the patient contact the pharmacy for the refill.) (Agent: If yes, when and what did the pharmacy advise?)  Preferred Pharmacy (with phone number or street name): [facility]  Agent: Please be advised that RX refills may take up to 3 business days. We ask that you follow-up with your pharmacy.

## 2020-09-23 NOTE — Telephone Encounter (Signed)
Requested Prescriptions  Pending Prescriptions Disp Refills  . lovastatin (MEVACOR) 20 MG tablet 180 tablet     Sig: TAKE TWO TABLETS BY MOUTH EVERY NIGHT AT BEDTIME. Please schedule office visit before any future refills.     Cardiovascular:  Antilipid - Statins Failed - 09/23/2020 10:27 AM      Failed - Total Cholesterol in normal range and within 360 days    Cholesterol, Total  Date Value Ref Range Status  09/04/2019 201 (H) 100 - 199 mg/dL Final         Failed - LDL in normal range and within 360 days    LDL Chol Calc (NIH)  Date Value Ref Range Status  09/04/2019 100 (H) 0 - 99 mg/dL Final         Failed - HDL in normal range and within 360 days    HDL  Date Value Ref Range Status  09/04/2019 37 (L) >39 mg/dL Final         Failed - Triglycerides in normal range and within 360 days    Triglycerides  Date Value Ref Range Status  09/04/2019 377 (H) 0 - 149 mg/dL Final         Passed - Patient is not pregnant      Passed - Valid encounter within last 12 months    Recent Outpatient Visits          6 months ago Essential hypertension   [place], Megan P, DO   7 months ago Mixed hyperlipidemia   [employer], Megan P, DO   8 months ago Essential hypertension   [facility] [place], Megan P, DO   9 months ago Controlled type 2 diabetes mellitus with microalbuminuria, without long-term current use of insulin ([place])   [facility], Megan P, DO   11 months ago Chronic pain syndrome   [employer], Megan P, DO      Future Appointments            In 11 months [facility], [facility]            . metoprolol tartrate (LOPRESSOR) 50 MG tablet 90 tablet 1    Sig: Take 1 tablet (50 mg total) by mouth daily.     Cardiovascular:  Beta Blockers Failed - 09/23/2020 10:27 AM      Failed - Last BP in normal range    BP Readings from Last 1 Encounters:  08/30/20 (!) 157/80          Passed - Last Heart Rate in normal range    Pulse Readings from Last 1 Encounters:  08/30/20 74         Passed - Valid encounter within last 6 months    Recent Outpatient Visits          6 months ago Essential hypertension   [place], Megan P, DO   7 months ago Mixed hyperlipidemia   [employer], Megan P, DO   8 months ago Essential hypertension   [place], Megan P, DO   9 months ago Controlled type 2 diabetes mellitus with microalbuminuria, without long-term current use of insulin ([place])   [facility], Megan P, DO   11 months ago Chronic pain syndrome   [facility] [place], Megan P, DO      Future Appointments            In  11 months [place], [facility]            . amLODipine (NORVASC) 5 MG tablet 90 tablet 1    Sig: Take 1 tablet (5 mg total) by mouth daily.     Cardiovascular:  Calcium Channel Blockers Failed - 09/23/2020 10:27 AM      Failed - Last BP in normal range    BP Readings from Last 1 Encounters:  08/30/20 (!) 157/80         Passed - Valid encounter within last 6 months    Recent Outpatient Visits          6 months ago Essential hypertension   [facility] [place], DO   7 months ago Mixed hyperlipidemia   [employer], Megan P, DO   8 months ago Essential hypertension   [place], Megan P, DO   9 months ago Controlled type 2 diabetes mellitus with microalbuminuria, without long-term current use of insulin ([place])   [facility], Megan P, DO   11 months ago Chronic pain syndrome   [employer], Megan P, DO      Future Appointments            In 11 months [place], [facility]            . metFORMIN (GLUCOPHAGE) 500 MG tablet 360 tablet 1    Sig: TAKE TWO TABLETS BY MOUTH TWICE A DAY WITH FOOD     Endocrinology:   Diabetes - Biguanides Failed - 09/23/2020 10:27 AM      Failed - HBA1C is between 0 and 7.9 and within 180 days    HB A1C (BAYER DCA - WAIVED)  Date Value Ref Range Status  12/07/2019 7.4 (H) <7.0 % Final    Comment:                                          Diabetic Adult            <7.0                                       Healthy Adult        4.3 - 5.7                                                           (DCCT/NGSP) American Diabetes Association's Summary of Glycemic Recommendations for Adults with Diabetes: Hemoglobin A1c <7.0%. More stringent glycemic goals (A1c <6.0%) may further reduce complications at the cost of increased risk of hypoglycemia.          Passed - Cr in normal range and within 360 days    Creatinine  Date Value Ref Range Status  06/02/2012 1.10 0.60 - 1.30 mg/dL Final   Creatinine, Ser  Date Value Ref Range Status  02/29/2020 1.07 0.76 - 1.27 mg/dL Final         Passed - eGFR in normal range and within 360 days    EGFR (African American)  Date Value Ref Range Status  06/02/2012 >  60  Final   GFR calc Af Amer  Date Value Ref Range Status  02/29/2020 86 >59 mL/min/1.73 Final   EGFR (Non-African Amer.)  Date Value Ref Range Status  06/02/2012 >60  Final    Comment:    eGFR values <39m/min/1.73 m2 may be an indication of chronic kidney disease (CKD). Calculated eGFR is useful in patients with stable renal function. The eGFR calculation will not be reliable in acutely ill patients when serum creatinine is changing rapidly. It is not useful in  patients on dialysis. The eGFR calculation may not be applicable to patients at the low and high extremes of body sizes, pregnant women, and vegetarians.    GFR calc non Af Amer  Date Value Ref Range Status  02/29/2020 75 >59 mL/min/1.73 Final         Passed - Valid encounter within last 6 months    Recent Outpatient Visits          6 months ago Essential hypertension   [place]   7 months ago Mixed hyperlipidemia   [facility]   8 months ago Essential hypertension   [place]   9 months ago Controlled type 2 diabetes mellitus with microalbuminuria, without long-term current use of insulin ([place]   [facility]   11 months ago Chronic pain syndrome   [facility]      Future Appointments            [facility]

## 2020-09-26 DIAGNOSIS — Z8673 Personal history of transient ischemic attack (TIA), and cerebral infarction without residual deficits: Secondary | ICD-10-CM | POA: Diagnosis not present

## 2020-09-26 DIAGNOSIS — R918 Other nonspecific abnormal finding of lung field: Secondary | ICD-10-CM | POA: Diagnosis not present

## 2020-09-26 DIAGNOSIS — E119 Type 2 diabetes mellitus without complications: Secondary | ICD-10-CM | POA: Diagnosis not present

## 2020-09-26 DIAGNOSIS — S2241XA Multiple fractures of ribs, right side, initial encounter for closed fracture: Secondary | ICD-10-CM | POA: Diagnosis not present

## 2020-09-26 DIAGNOSIS — Z20822 Contact with and (suspected) exposure to covid-19: Secondary | ICD-10-CM | POA: Diagnosis not present

## 2020-09-26 DIAGNOSIS — S36892A Contusion of other intra-abdominal organs, initial encounter: Secondary | ICD-10-CM | POA: Diagnosis not present

## 2020-09-26 DIAGNOSIS — R0789 Other chest pain: Secondary | ICD-10-CM | POA: Diagnosis not present

## 2020-09-26 DIAGNOSIS — S0990XA Unspecified injury of head, initial encounter: Secondary | ICD-10-CM | POA: Diagnosis not present

## 2020-09-26 DIAGNOSIS — I10 Essential (primary) hypertension: Secondary | ICD-10-CM | POA: Diagnosis not present

## 2020-09-26 DIAGNOSIS — K8020 Calculus of gallbladder without cholecystitis without obstruction: Secondary | ICD-10-CM | POA: Diagnosis not present

## 2020-09-26 DIAGNOSIS — K5730 Diverticulosis of large intestine without perforation or abscess without bleeding: Secondary | ICD-10-CM | POA: Diagnosis not present

## 2020-09-27 DIAGNOSIS — Z87891 Personal history of nicotine dependence: Secondary | ICD-10-CM | POA: Diagnosis not present

## 2020-09-27 DIAGNOSIS — S2249XA Multiple fractures of ribs, unspecified side, initial encounter for closed fracture: Secondary | ICD-10-CM

## 2020-09-27 DIAGNOSIS — W1789XA Other fall from one level to another, initial encounter: Secondary | ICD-10-CM | POA: Diagnosis not present

## 2020-09-27 DIAGNOSIS — R918 Other nonspecific abnormal finding of lung field: Secondary | ICD-10-CM | POA: Diagnosis present

## 2020-09-27 DIAGNOSIS — Z20822 Contact with and (suspected) exposure to covid-19: Secondary | ICD-10-CM | POA: Diagnosis present

## 2020-09-27 DIAGNOSIS — Z951 Presence of aortocoronary bypass graft: Secondary | ICD-10-CM | POA: Diagnosis not present

## 2020-09-27 DIAGNOSIS — J90 Pleural effusion, not elsewhere classified: Secondary | ICD-10-CM | POA: Diagnosis not present

## 2020-09-27 DIAGNOSIS — K8020 Calculus of gallbladder without cholecystitis without obstruction: Secondary | ICD-10-CM | POA: Diagnosis present

## 2020-09-27 DIAGNOSIS — I10 Essential (primary) hypertension: Secondary | ICD-10-CM | POA: Diagnosis not present

## 2020-09-27 DIAGNOSIS — M50323 Other cervical disc degeneration at C6-C7 level: Secondary | ICD-10-CM | POA: Diagnosis not present

## 2020-09-27 DIAGNOSIS — R0989 Other specified symptoms and signs involving the circulatory and respiratory systems: Secondary | ICD-10-CM | POA: Diagnosis not present

## 2020-09-27 DIAGNOSIS — S2231XA Fracture of one rib, right side, initial encounter for closed fracture: Secondary | ICD-10-CM | POA: Diagnosis not present

## 2020-09-27 DIAGNOSIS — E119 Type 2 diabetes mellitus without complications: Secondary | ICD-10-CM | POA: Diagnosis not present

## 2020-09-27 DIAGNOSIS — R0902 Hypoxemia: Secondary | ICD-10-CM | POA: Diagnosis not present

## 2020-09-27 DIAGNOSIS — J9819 Other pulmonary collapse: Secondary | ICD-10-CM | POA: Diagnosis not present

## 2020-09-27 DIAGNOSIS — I25810 Atherosclerosis of coronary artery bypass graft(s) without angina pectoris: Secondary | ICD-10-CM | POA: Diagnosis not present

## 2020-09-27 DIAGNOSIS — J984 Other disorders of lung: Secondary | ICD-10-CM | POA: Diagnosis not present

## 2020-09-27 DIAGNOSIS — J9811 Atelectasis: Secondary | ICD-10-CM | POA: Diagnosis not present

## 2020-09-27 DIAGNOSIS — Z8673 Personal history of transient ischemic attack (TIA), and cerebral infarction without residual deficits: Secondary | ICD-10-CM | POA: Diagnosis not present

## 2020-09-27 DIAGNOSIS — S2241XA Multiple fractures of ribs, right side, initial encounter for closed fracture: Secondary | ICD-10-CM | POA: Diagnosis not present

## 2020-09-27 DIAGNOSIS — S36892A Contusion of other intra-abdominal organs, initial encounter: Secondary | ICD-10-CM | POA: Diagnosis not present

## 2020-09-27 DIAGNOSIS — S3992XA Unspecified injury of lower back, initial encounter: Secondary | ICD-10-CM | POA: Diagnosis not present

## 2020-09-27 DIAGNOSIS — S199XXA Unspecified injury of neck, initial encounter: Secondary | ICD-10-CM | POA: Diagnosis not present

## 2020-09-27 DIAGNOSIS — R0603 Acute respiratory distress: Secondary | ICD-10-CM | POA: Diagnosis not present

## 2020-09-27 DIAGNOSIS — Z86718 Personal history of other venous thrombosis and embolism: Secondary | ICD-10-CM | POA: Diagnosis not present

## 2020-09-27 DIAGNOSIS — Z043 Encounter for examination and observation following other accident: Secondary | ICD-10-CM | POA: Diagnosis not present

## 2020-09-27 DIAGNOSIS — Z7984 Long term (current) use of oral hypoglycemic drugs: Secondary | ICD-10-CM | POA: Diagnosis not present

## 2020-09-27 DIAGNOSIS — I517 Cardiomegaly: Secondary | ICD-10-CM | POA: Diagnosis not present

## 2020-09-27 DIAGNOSIS — I519 Heart disease, unspecified: Secondary | ICD-10-CM | POA: Diagnosis not present

## 2020-09-27 DIAGNOSIS — Z9981 Dependence on supplemental oxygen: Secondary | ICD-10-CM | POA: Diagnosis not present

## 2020-09-27 DIAGNOSIS — G8911 Acute pain due to trauma: Secondary | ICD-10-CM | POA: Diagnosis not present

## 2020-09-27 HISTORY — DX: Multiple fractures of ribs, unspecified side, initial encounter for closed fracture: S22.49XA

## 2020-09-28 DIAGNOSIS — S2241XA Multiple fractures of ribs, right side, initial encounter for closed fracture: Secondary | ICD-10-CM | POA: Diagnosis not present

## 2020-09-28 DIAGNOSIS — J9811 Atelectasis: Secondary | ICD-10-CM | POA: Diagnosis not present

## 2020-09-28 DIAGNOSIS — J90 Pleural effusion, not elsewhere classified: Secondary | ICD-10-CM | POA: Diagnosis not present

## 2020-09-28 DIAGNOSIS — R0989 Other specified symptoms and signs involving the circulatory and respiratory systems: Secondary | ICD-10-CM | POA: Diagnosis not present

## 2020-09-29 DIAGNOSIS — J90 Pleural effusion, not elsewhere classified: Secondary | ICD-10-CM | POA: Diagnosis not present

## 2020-09-29 DIAGNOSIS — J9811 Atelectasis: Secondary | ICD-10-CM | POA: Diagnosis not present

## 2020-09-29 DIAGNOSIS — R0902 Hypoxemia: Secondary | ICD-10-CM | POA: Diagnosis not present

## 2020-09-29 DIAGNOSIS — I517 Cardiomegaly: Secondary | ICD-10-CM | POA: Diagnosis not present

## 2020-09-29 DIAGNOSIS — I519 Heart disease, unspecified: Secondary | ICD-10-CM | POA: Diagnosis not present

## 2020-09-30 DIAGNOSIS — J90 Pleural effusion, not elsewhere classified: Secondary | ICD-10-CM | POA: Diagnosis not present

## 2020-09-30 DIAGNOSIS — R0902 Hypoxemia: Secondary | ICD-10-CM | POA: Diagnosis not present

## 2020-10-05 DIAGNOSIS — Z951 Presence of aortocoronary bypass graft: Secondary | ICD-10-CM | POA: Diagnosis not present

## 2020-10-05 DIAGNOSIS — Z95828 Presence of other vascular implants and grafts: Secondary | ICD-10-CM | POA: Diagnosis not present

## 2020-10-05 DIAGNOSIS — Z87891 Personal history of nicotine dependence: Secondary | ICD-10-CM | POA: Diagnosis not present

## 2020-10-05 DIAGNOSIS — W1789XD Other fall from one level to another, subsequent encounter: Secondary | ICD-10-CM | POA: Diagnosis not present

## 2020-10-05 DIAGNOSIS — S2241XA Multiple fractures of ribs, right side, initial encounter for closed fracture: Secondary | ICD-10-CM | POA: Diagnosis not present

## 2020-10-05 DIAGNOSIS — I10 Essential (primary) hypertension: Secondary | ICD-10-CM | POA: Diagnosis not present

## 2020-10-05 DIAGNOSIS — E119 Type 2 diabetes mellitus without complications: Secondary | ICD-10-CM | POA: Diagnosis not present

## 2020-10-05 DIAGNOSIS — K8080 Other cholelithiasis without obstruction: Secondary | ICD-10-CM | POA: Diagnosis not present

## 2020-10-05 DIAGNOSIS — Z86718 Personal history of other venous thrombosis and embolism: Secondary | ICD-10-CM | POA: Diagnosis not present

## 2020-10-05 DIAGNOSIS — R200 Anesthesia of skin: Secondary | ICD-10-CM | POA: Diagnosis not present

## 2020-10-05 DIAGNOSIS — Z9181 History of falling: Secondary | ICD-10-CM | POA: Diagnosis not present

## 2020-10-05 DIAGNOSIS — S2241XD Multiple fractures of ribs, right side, subsequent encounter for fracture with routine healing: Secondary | ICD-10-CM | POA: Diagnosis not present

## 2020-10-05 DIAGNOSIS — I2510 Atherosclerotic heart disease of native coronary artery without angina pectoris: Secondary | ICD-10-CM | POA: Diagnosis not present

## 2020-10-05 DIAGNOSIS — Z7982 Long term (current) use of aspirin: Secondary | ICD-10-CM | POA: Diagnosis not present

## 2020-10-05 DIAGNOSIS — S36892D Contusion of other intra-abdominal organs, subsequent encounter: Secondary | ICD-10-CM | POA: Diagnosis not present

## 2020-10-05 DIAGNOSIS — I69398 Other sequelae of cerebral infarction: Secondary | ICD-10-CM | POA: Diagnosis not present

## 2020-10-05 DIAGNOSIS — R918 Other nonspecific abnormal finding of lung field: Secondary | ICD-10-CM | POA: Diagnosis not present

## 2020-10-06 DIAGNOSIS — I10 Essential (primary) hypertension: Secondary | ICD-10-CM | POA: Diagnosis not present

## 2020-10-06 DIAGNOSIS — I69398 Other sequelae of cerebral infarction: Secondary | ICD-10-CM | POA: Diagnosis not present

## 2020-10-06 DIAGNOSIS — I2510 Atherosclerotic heart disease of native coronary artery without angina pectoris: Secondary | ICD-10-CM | POA: Diagnosis not present

## 2020-10-06 DIAGNOSIS — S36892D Contusion of other intra-abdominal organs, subsequent encounter: Secondary | ICD-10-CM | POA: Diagnosis not present

## 2020-10-06 DIAGNOSIS — S2241XD Multiple fractures of ribs, right side, subsequent encounter for fracture with routine healing: Secondary | ICD-10-CM | POA: Diagnosis not present

## 2020-10-06 DIAGNOSIS — E119 Type 2 diabetes mellitus without complications: Secondary | ICD-10-CM | POA: Diagnosis not present

## 2020-10-08 ENCOUNTER — Telehealth: Payer: Self-pay

## 2020-10-08 ENCOUNTER — Other Ambulatory Visit: Payer: Self-pay

## 2020-10-08 ENCOUNTER — Encounter: Payer: Self-pay | Admitting: Nurse Practitioner

## 2020-10-08 ENCOUNTER — Ambulatory Visit (INDEPENDENT_AMBULATORY_CARE_PROVIDER_SITE_OTHER): Payer: Medicare Other | Admitting: Nurse Practitioner

## 2020-10-08 VITALS — BP 120/75 | HR 80 | Temp 98.0°F | Resp 16 | Wt 182.6 lb

## 2020-10-08 DIAGNOSIS — S36892D Contusion of other intra-abdominal organs, subsequent encounter: Secondary | ICD-10-CM | POA: Diagnosis not present

## 2020-10-08 DIAGNOSIS — I152 Hypertension secondary to endocrine disorders: Secondary | ICD-10-CM

## 2020-10-08 DIAGNOSIS — I69398 Other sequelae of cerebral infarction: Secondary | ICD-10-CM | POA: Diagnosis not present

## 2020-10-08 DIAGNOSIS — E785 Hyperlipidemia, unspecified: Secondary | ICD-10-CM | POA: Diagnosis not present

## 2020-10-08 DIAGNOSIS — S2241XD Multiple fractures of ribs, right side, subsequent encounter for fracture with routine healing: Secondary | ICD-10-CM | POA: Diagnosis not present

## 2020-10-08 DIAGNOSIS — E1169 Type 2 diabetes mellitus with other specified complication: Secondary | ICD-10-CM

## 2020-10-08 DIAGNOSIS — I25119 Atherosclerotic heart disease of native coronary artery with unspecified angina pectoris: Secondary | ICD-10-CM

## 2020-10-08 DIAGNOSIS — E1159 Type 2 diabetes mellitus with other circulatory complications: Secondary | ICD-10-CM

## 2020-10-08 DIAGNOSIS — S2241XA Multiple fractures of ribs, right side, initial encounter for closed fracture: Secondary | ICD-10-CM

## 2020-10-08 DIAGNOSIS — E1165 Type 2 diabetes mellitus with hyperglycemia: Secondary | ICD-10-CM | POA: Diagnosis not present

## 2020-10-08 DIAGNOSIS — S2249XA Multiple fractures of ribs, unspecified side, initial encounter for closed fracture: Secondary | ICD-10-CM | POA: Insufficient documentation

## 2020-10-08 DIAGNOSIS — I2510 Atherosclerotic heart disease of native coronary artery without angina pectoris: Secondary | ICD-10-CM | POA: Diagnosis not present

## 2020-10-08 DIAGNOSIS — E119 Type 2 diabetes mellitus without complications: Secondary | ICD-10-CM | POA: Diagnosis not present

## 2020-10-08 DIAGNOSIS — I10 Essential (primary) hypertension: Secondary | ICD-10-CM | POA: Diagnosis not present

## 2020-10-08 LAB — MICROALBUMIN, URINE WAIVED
Creatinine, Urine Waived: 100 mg/dL (ref 10–300)
Microalb, Ur Waived: 150 mg/L — ABNORMAL HIGH (ref 0–19)
Microalb/Creat Ratio: >300 mg/g — ABNORMAL HIGH (ref <30)

## 2020-10-08 LAB — BAYER DCA HB A1C WAIVED: HB A1C (BAYER DCA - WAIVED): 7.2 % — ABNORMAL HIGH (ref <7.0)

## 2020-10-08 MED ORDER — ONETOUCH VERIO W/DEVICE KIT: PACK | 0 refills | Status: AC

## 2020-10-08 MED ORDER — OXYCODONE HCL 5 MG PO TABS: 5.0000 mg | ORAL_TABLET | Freq: Four times a day (QID) | ORAL | 0 refills | Status: DC | PRN

## 2020-10-08 MED ORDER — ONETOUCH ULTRASOFT LANCETS MISC: 12 refills | Status: AC

## 2020-10-08 MED ORDER — ONETOUCH VERIO VI STRP: ORAL_STRIP | 12 refills | Status: DC

## 2020-10-08 NOTE — Assessment & Plan Note (Addendum)
Chronic, ongoing with A1C today 7.2%.  Urine ALB 150, continue Lisinopril for kidney protection.  Will continue current medication regimen and adjust as needed.  Recommend strict focus on diet at home.  Will send in glucometer and supplies covered by Medicare.  Recommend checking BS fasting daily and document.  Return to office for diabetes check in 3 months.

## 2020-10-08 NOTE — Assessment & Plan Note (Signed)
Chronic, ongoing.  Continue current medication regimen and adjust as needed. Lipid panel today. 

## 2020-10-08 NOTE — Assessment & Plan Note (Signed)
Recent injury and hospitalization.  He declines CXR repeat today.  Will send in short refill of pain medication, PDMP reviewed.  Oxycodone refill sent for 5 days, #20 pills with no refills.  Recommend he continue incentive spirometer frequently at home and apply ice to area of discomfort as needed.  May also use Voltaren or Icy/Hot gel.  If ongoing pain then repeat imaging and consider addition of Lidocaine patches.  He is aware opioid is short term use only.  Return in 4 weeks for follow-up, sooner if worsening pain.

## 2020-10-08 NOTE — Assessment & Plan Note (Signed)
Chronic, stable with BP at goal today in office.  Continue current medication regimen and adjust as needed.  Recommend he monitor BP at few days a week at home and document + focus on DASH diet.  BMP and TSH today.  Return to office in 3 months.

## 2020-10-08 NOTE — Telephone Encounter (Signed)
Verbal orders given.  CM 

## 2020-10-08 NOTE — Progress Notes (Signed)
BP 120/75 (BP Location: Left Arm, Patient Position: Sitting, Cuff Size: Normal)   Pulse 80   Temp 98 F (36.7 C) (Oral)   Resp 16   Wt 182 lb 9.6 oz (82.8 kg)   SpO2 97%   BMI 25.47 kg/m    Subjective:    Patient ID: Lucas Weiss, male    DOB: [birth year], 64 y.o.   MRN: [id number]  HPI: Lucas Weiss is a 64 y.o. male  Chief Complaint  Patient presents with  . Hospitalization Follow-up    admitted to [facility] hospital due to fall. Patient was treated for multiple fractures of ribs on right side.   Transition of Care Hospital Follow up.  Was admitted to [facility] -- slipped off deck pushing a board over and fractured multiple ribs, on review of [facility] notes and imaging -- CXR small bilat pleural effusion + fracture posterior right 10th through 12th ribs and hematoma to mesenteric area.  On admission to hospital his O2 sats were 70% due to rib fractures and inadequate pain control.  He was sent home with Oxycodone 5 MG IR, which he reports benefit with.  He endorses ongoing pain to site of fractures, about a 6/10 at worst, for which medication helps.  Denies SOB, CP, cough, hemoptysis, edema, or fatigue.  Is performing deep breathing exercises at home with spirometer.    Hospital/Facility: [facility] D/C Physician: Dr. Percell Miller D/C Date: 10/04/2020  Records Requested: 10/08/2020 Records Received: 10/08/2020 Records Reviewed: 10/08/2020  Diagnoses on Discharge: Multiple rib fractures  Date of interactive Contact within 48 hours of discharge:  Contact was through: phone  Date of 7 day or 14 day face-to-face visit:    within 14 days  Outpatient Encounter Medications as of 10/08/2020  Medication Sig  . acetaminophen (TYLENOL) 325 MG tablet Take 325 mg by mouth every 4 (four) hours as needed.   Marland Kitchen amLODipine (NORVASC) 5 MG tablet Take 1 tablet (5 mg total) by mouth daily.  Marland Kitchen aspirin EC 81 MG tablet Take 81 mg by mouth daily.  . empagliflozin (JARDIANCE) 25 MG TABS  tablet Take 25 mg by mouth daily before breakfast.  . gabapentin (NEURONTIN) 300 MG capsule Take by mouth.  . lidocaine (LIDODERM) 5 % Place onto the skin.  Marland Kitchen lisinopril (ZESTRIL) 40 MG tablet Take 1 tablet (40 mg total) by mouth daily.  Marland Kitchen lovastatin (MEVACOR) 20 MG tablet TAKE TWO TABLETS BY MOUTH EVERY NIGHT AT BEDTIME. Please schedule office visit before any future refills.  . metFORMIN (GLUCOPHAGE) 500 MG tablet TAKE TWO TABLETS BY MOUTH TWICE A DAY WITH FOOD  . metoprolol tartrate (LOPRESSOR) 50 MG tablet Take 1 tablet (50 mg total) by mouth daily.  . nitroGLYCERIN (NITROSTAT) 0.4 MG SL tablet Place 1 tablet (0.4 mg total) under the tongue every 5 (five) minutes as needed for chest pain.  Marland Kitchen oxyCODONE (OXY IR/ROXICODONE) 5 MG immediate release tablet Take 1 tablet (5 mg total) by mouth every 6 (six) hours as needed for up to 5 days for severe pain.  Marland Kitchen QUEtiapine (SEROQUEL) 25 MG tablet Take 1 tablet (25 mg total) by mouth at bedtime.  . [DISCONTINUED] oxyCODONE (OXY IR/ROXICODONE) 5 MG immediate release tablet Take by mouth.  . Blood Glucose Monitoring Suppl (ONETOUCH VERIO) w/Device KIT To check blood sugar twice a day and document, bring to provider visits.  Marland Kitchen glucose blood (ONETOUCH VERIO) test strip To check blood sugar twice a day and document, bring to provider visits.  Marland Kitchen  Lancets (ONETOUCH ULTRASOFT) lancets To check blood sugar twice a day and document, bring to provider visits.   No facility-administered encounter medications on file as of 10/08/2020.    Diagnostic Tests Reviewed/Disposition: Reviewed on chart, [facility]  Consults: General surgery  Discharge Instructions: Follow-up with PCP  Disease/illness Education: Reviewed with patient  Home Health/Community Services Discussions/Referrals: none  Establishment or re-establishment of referral orders for community resources:none  Discussion with other health care providers: reviewed notes in chart  Assessment and Support of  treatment regimen adherence: Reviewed with patient  Appointments Coordinated with: Reviewed with patient  Education for self-management, independent living, and ADLs: Reviewed with patient  DIABETES Last A1C 7.4% in 2020.  Takes Metformin 500 MG twice a day + Jardiance 25 MG daily. Hypoglycemic episodes:not checking Polydipsia/polyuria: no Visual disturbance: no Chest pain: no Paresthesias: no Glucose Monitoring: no  Accucheck frequency: Not Checking  Fasting glucose:  Post prandial:  Evening:  Before meals: Taking Insulin?: no  Long acting insulin:  Short acting insulin: Blood Pressure Monitoring: not checking Retinal Examination: Not up to Date Foot Exam: Up to Date Pneumovax: Up to Date Influenza: Up to Date Aspirin: yes   HYPERTENSION / HYPERLIPIDEMIA Continues Lovastatin, Lisinopril, Amlodipine, and Metoprolol, ASA.  Satisfied with current treatment? yes Duration of hypertension: chronic BP monitoring frequency: not checking BP range:  BP medication side effects: no Duration of hyperlipidemia: chronic Cholesterol medication side effects: no Cholesterol supplements: none Medication compliance: good compliance Aspirin: yes Recent stressors: no Recurrent headaches: no Visual changes: no Palpitations: no Dyspnea: no Chest pain: no Lower extremity edema: no Dizzy/lightheaded: no  Relevant past medical, surgical, family and social history reviewed and updated as indicated. Interim medical history since our last visit reviewed. Allergies and medications reviewed and updated.  Review of Systems  Constitutional: Negative for activity change, diaphoresis, fatigue and fever.  Respiratory: Negative for cough, chest tightness, shortness of breath and wheezing.   Cardiovascular: Negative for chest pain, palpitations and leg swelling.  Gastrointestinal: Negative.   Endocrine: Negative for polydipsia, polyphagia and polyuria.  Musculoskeletal: Positive for arthralgias.   Neurological: Negative.   Psychiatric/Behavioral: Negative.     Per HPI unless specifically indicated above     Objective:    BP 120/75 (BP Location: Left Arm, Patient Position: Sitting, Cuff Size: Normal)   Pulse 80   Temp 98 F (36.7 C) (Oral)   Resp 16   Wt 182 lb 9.6 oz (82.8 kg)   SpO2 97%   BMI 25.47 kg/m   Wt Readings from Last 3 Encounters:  10/08/20 182 lb 9.6 oz (82.8 kg)  08/30/20 185 lb (83.9 kg)  12/07/19 185 lb (83.9 kg)    Physical Exam Vitals and nursing note reviewed.  Constitutional:      General: He is awake. He is not in acute distress.    Appearance: He is well-developed and well-groomed. He is not ill-appearing or toxic-appearing.  HENT:     Head: Normocephalic and atraumatic.     Right Ear: Hearing normal. No drainage.     Left Ear: Hearing normal. No drainage.  Eyes:     General: Lids are normal.        Right eye: No discharge.        Left eye: No discharge.     Conjunctiva/sclera: Conjunctivae normal.     Pupils: Pupils are equal, round, and reactive to light.  Neck:     Thyroid: No thyromegaly.     Vascular: No carotid bruit.  Trachea: Trachea normal.  Cardiovascular:     Rate and Rhythm: Normal rate and regular rhythm.     Heart sounds: Normal heart sounds, S1 normal and S2 normal. No murmur heard.  No gallop.   Pulmonary:     Effort: Pulmonary effort is normal. No accessory muscle usage or respiratory distress.     Breath sounds: Normal breath sounds. No decreased breath sounds.     Comments: Mild tenderness on palpation over right lower ribs, none to left. Abdominal:     General: Bowel sounds are normal.     Palpations: Abdomen is soft.  Musculoskeletal:        General: Normal range of motion.     Cervical back: Normal range of motion and neck supple.     Right lower leg: No edema.     Left lower leg: No edema.  Skin:    General: Skin is warm and dry.     Capillary Refill: Capillary refill takes less than 2 seconds.      Findings: No rash.       Neurological:     Mental Status: He is alert and oriented to person, place, and time.     Deep Tendon Reflexes: Reflexes are normal and symmetric.  Psychiatric:        Attention and Perception: Attention normal.        Mood and Affect: Mood normal.        Speech: Speech normal.        Behavior: Behavior normal. Behavior is cooperative.        Thought Content: Thought content normal.     Results for orders placed or performed in visit on 82/50/53  Basic metabolic panel  Result Value Ref Range   Glucose 204 (H) 65 - 99 mg/dL   BUN 21 8 - 27 mg/dL   Creatinine, Ser 1.07 0.76 - 1.27 mg/dL   GFR calc non Af Amer 75 >59 mL/min/1.73   GFR calc Af Amer 86 >59 mL/min/1.73   BUN/Creatinine Ratio 20 10 - 24   Sodium 137 134 - 144 mmol/L   Potassium 4.5 3.5 - 5.2 mmol/L   Chloride 103 96 - 106 mmol/L   CO2 19 (L) 20 - 29 mmol/L   Calcium 9.4 8.6 - 10.2 mg/dL      Assessment & Plan:   Problem List Items Addressed This Visit      Cardiovascular and Mediastinum   Hypertension associated with diabetes ([place])    Chronic, stable with BP at goal today in office.  Continue current medication regimen and adjust as needed.  Recommend he monitor BP at few days a week at home and document + focus on DASH diet.  BMP and TSH today.  Return to office in 3 months.      Relevant Orders   Bayer DCA Hb A1c Waived   Microalbumin, Urine Waived   Basic metabolic panel   TSH     Endocrine   Hyperlipidemia associated with type 2 diabetes mellitus (HCC)    Chronic, ongoing.  Continue current medication regimen and adjust as needed.  Lipid panel today.      Relevant Orders   Bayer DCA Hb A1c Waived   Lipid Panel w/o Chol/HDL Ratio   Controlled type 2 diabetes mellitus ([place]) - Primary    Chronic, ongoing with A1C today 7.2%.  Urine ALB 150, continue Lisinopril for kidney protection.  Will continue current medication regimen and adjust as needed.  Recommend strict focus on  diet  at home.  Will send in glucometer and supplies covered by Medicare.  Recommend checking BS fasting daily and document.  Return to office for diabetes check in 3 months.      Relevant Orders   Bayer DCA Hb A1c Waived   Microalbumin, Urine Waived     Musculoskeletal and Integument   Multiple rib fractures    Recent injury and hospitalization.  He declines CXR repeat today.  Will send in short refill of pain medication, PDMP reviewed.  Oxycodone refill sent for 5 days, #20 pills with no refills.  Recommend he continue incentive spirometer frequently at home and apply ice to area of discomfort as needed.  May also use Voltaren or Icy/Hot gel.  If ongoing pain then repeat imaging and consider addition of Lidocaine patches.  He is aware opioid is short term use only.  Return in 4 weeks for follow-up, sooner if worsening pain.      Relevant Orders   CBC with Differential/Platelet       Follow up plan: Return in about 4 weeks (around 11/05/2020) for Rib fracture follow-up with PCP in office.

## 2020-10-08 NOTE — Patient Instructions (Signed)
Rib Fracture  A rib fracture is a break or crack in one of the bones of the ribs. The ribs are like a cage that goes around your upper chest. A broken or cracked rib is often painful, but most do not cause other problems. Most rib fractures usually heal on their own in 1-3 months. Follow these instructions at home: Managing pain, stiffness, and swelling  If directed, apply ice to the injured area. ? Put ice in a plastic bag. ? Place a towel between your skin and the bag. ? Leave the ice on for 20 minutes, 2-3 times a day.  Take over-the-counter and prescription medicines only as told by your doctor. Activity  Avoid activities that cause pain to the injured area. Protect your injured area.  Slowly increase activity as told by your doctor. General instructions  Do deep breathing as told by your doctor. You may be told to: ? Take deep breaths many times a day. ? Cough many times a day while hugging a pillow. ? Use a device (incentive spirometer) to do deep breathing many times a day.  Drink enough fluid to keep your pee (urine) clear or pale yellow.  Do not wear a rib belt or binder. These do not allow you to breathe deeply.  Keep all follow-up visits as told by your doctor. This is important. Contact a doctor if:  You have a fever. Get help right away if:  You have trouble breathing.  You are short of breath.  You cannot stop coughing.  You cough up thick or bloody spit (sputum).  You feel sick to your stomach (nauseous), throw up (vomit), or have belly (abdominal) pain.  Your pain gets worse and medicine does not help. Summary  A rib fracture is a break or crack in one of the bones of the ribs.  Apply ice to the injured area and take medicines for pain as told by your doctor.  Take deep breaths and cough many times a day. Hug a pillow every time you cough. This information is not intended to replace advice given to you by your health care provider. Make sure you  discuss any questions you have with your health care provider. Document Revised: 11/19/2017 Document Reviewed: 03/09/2017 Elsevier Patient Education  2020 [employer]  

## 2020-10-08 NOTE — Telephone Encounter (Signed)
Copied from CRM [id number]. Topic: Quick Communication - See Telephone Encounter >> Oct 08, 2020 10:00 AM Aretta Nip wrote: CRM for notification. See Telephone encounter for: 10/08/20.Verbal [facility] 1 day for 4 wks and then 2 times as needed  PRN for Home health Nursing [facility] Lori/ [facility] [phone] may leave message

## 2020-10-08 NOTE — Telephone Encounter (Signed)
OK for verbal orders?

## 2020-10-09 LAB — LIPID PANEL W/O CHOL/HDL RATIO
Cholesterol, Total: 179 mg/dL (ref 100–199)
HDL: 38 mg/dL — ABNORMAL LOW (ref >39)
LDL Chol Calc (NIH): 96 mg/dL (ref 0–99)
Triglycerides: 269 mg/dL — ABNORMAL HIGH (ref 0–149)
VLDL Cholesterol Cal: 45 mg/dL — ABNORMAL HIGH (ref 5–40)

## 2020-10-09 LAB — CBC WITH DIFFERENTIAL/PLATELET
Basophils Absolute: 0.1 10*3/uL (ref 0.0–0.2)
Basos: 1 %
EOS (ABSOLUTE): 0.4 10*3/uL (ref 0.0–0.4)
Eos: 5 %
Hematocrit: 36.7 % — ABNORMAL LOW (ref 37.5–51.0)
Hemoglobin: 11.7 g/dL — ABNORMAL LOW (ref 13.0–17.7)
Immature Grans (Abs): 0 10*3/uL (ref 0.0–0.1)
Immature Granulocytes: 0 %
Lymphocytes Absolute: 1.9 10*3/uL (ref 0.7–3.1)
Lymphs: 27 %
MCH: 27.5 pg (ref 26.6–33.0)
MCHC: 31.9 g/dL (ref 31.5–35.7)
MCV: 86 fL (ref 79–97)
Monocytes Absolute: 0.6 10*3/uL (ref 0.1–0.9)
Monocytes: 8 %
Neutrophils Absolute: 4.1 10*3/uL (ref 1.4–7.0)
Neutrophils: 59 %
Platelets: 327 10*3/uL (ref 150–450)
RBC: 4.26 x10E6/uL (ref 4.14–5.80)
RDW: 13.9 % (ref 11.6–15.4)
WBC: 7.1 10*3/uL (ref 3.4–10.8)

## 2020-10-09 LAB — TSH: TSH: 1.44 u[IU]/mL (ref 0.450–4.500)

## 2020-10-09 LAB — BASIC METABOLIC PANEL
BUN/Creatinine Ratio: 17 (ref 10–24)
BUN: 19 mg/dL (ref 8–27)
CO2: 20 mmol/L (ref 20–29)
Calcium: 9 mg/dL (ref 8.6–10.2)
Chloride: 104 mmol/L (ref 96–106)
Creatinine, Ser: 1.12 mg/dL (ref 0.76–1.27)
GFR calc Af Amer: 81 mL/min/{1.73_m2} (ref >59)
GFR calc non Af Amer: 70 mL/min/{1.73_m2} (ref >59)
Glucose: 110 mg/dL — ABNORMAL HIGH (ref 65–99)
Potassium: 4.6 mmol/L (ref 3.5–5.2)
Sodium: 139 mmol/L (ref 134–144)

## 2020-10-09 NOTE — Progress Notes (Signed)
Please let Lucas Weiss know his labs have returned.  Kidney function is stable and thyroid level normal.  Cholesterol levels are at baseline for him, continue medication.  CBC did show some mild anemia, would like to recheck this at his visit with his PCP in 4 weeks.  Please make sure to schedule this appointment, as looks like it was not scheduled when you left yesterday.  Any questions?  Have a great day!!

## 2020-10-11 ENCOUNTER — Ambulatory Visit: Payer: Self-pay | Admitting: General Practice

## 2020-10-11 ENCOUNTER — Telehealth: Payer: Medicare Other | Admitting: General Practice

## 2020-10-11 DIAGNOSIS — I152 Hypertension secondary to endocrine disorders: Secondary | ICD-10-CM

## 2020-10-11 DIAGNOSIS — E1159 Type 2 diabetes mellitus with other circulatory complications: Secondary | ICD-10-CM

## 2020-10-11 DIAGNOSIS — E1169 Type 2 diabetes mellitus with other specified complication: Secondary | ICD-10-CM

## 2020-10-11 DIAGNOSIS — E1165 Type 2 diabetes mellitus with hyperglycemia: Secondary | ICD-10-CM

## 2020-10-11 DIAGNOSIS — S2241XA Multiple fractures of ribs, right side, initial encounter for closed fracture: Secondary | ICD-10-CM

## 2020-10-11 NOTE — Chronic Care Management (AMB) (Signed)
Chronic Care Management   Initial Visit Note  10/11/2020 Name: Lucas Weiss MRN: [id number] DOB: 1956-07-16  Referred by: [name], DO Reason for referral : Chronic Care Management (RNCM Initial Outreach for Chronic disease Manaement and Care Coordination Needs)   Lucas Weiss is a 64 y.o. year old male who is a primary care patient of [name], DO. The CCM team was consulted for assistance with chronic disease management and care coordination needs related to HTN, HLD, DMII and fall with rib fractures  Review of patient status, including review of consultants reports, relevant laboratory and other test results, and collaboration with appropriate care team members and the patient's provider was performed as part of comprehensive patient evaluation and provision of chronic care management services.    SDOH (Social Determinants of Health) assessments performed: Yes See Care Plan activities for detailed interventions related to SDOH     Medications: Outpatient Encounter Medications as of 10/11/2020  Medication Sig  . acetaminophen (TYLENOL) 325 MG tablet Take 325 mg by mouth every 4 (four) hours as needed.   Marland Kitchen amLODipine (NORVASC) 5 MG tablet Take 1 tablet (5 mg total) by mouth daily.  Marland Kitchen aspirin EC 81 MG tablet Take 81 mg by mouth daily.  . Blood Glucose Monitoring Suppl (ONETOUCH VERIO) w/Device KIT To check blood sugar twice a day and document, bring to provider visits.  . empagliflozin (JARDIANCE) 25 MG TABS tablet Take 25 mg by mouth daily before breakfast.  . gabapentin (NEURONTIN) 300 MG capsule Take by mouth.  Marland Kitchen glucose blood (ONETOUCH VERIO) test strip To check blood sugar twice a day and document, bring to provider visits.  . Lancets (ONETOUCH ULTRASOFT) lancets To check blood sugar twice a day and document, bring to provider visits.  Marland Kitchen lidocaine (LIDODERM) 5 % Place onto the skin.  Marland Kitchen lisinopril (ZESTRIL) 40 MG tablet Take 1 tablet (40 mg total) by mouth daily.    Marland Kitchen lovastatin (MEVACOR) 20 MG tablet TAKE TWO TABLETS BY MOUTH EVERY NIGHT AT BEDTIME. Please schedule office visit before any future refills.  . metFORMIN (GLUCOPHAGE) 500 MG tablet TAKE TWO TABLETS BY MOUTH TWICE A DAY WITH FOOD  . metoprolol tartrate (LOPRESSOR) 50 MG tablet Take 1 tablet (50 mg total) by mouth daily.  . nitroGLYCERIN (NITROSTAT) 0.4 MG SL tablet Place 1 tablet (0.4 mg total) under the tongue every 5 (five) minutes as needed for chest pain.  Marland Kitchen oxyCODONE (OXY IR/ROXICODONE) 5 MG immediate release tablet Take 1 tablet (5 mg total) by mouth every 6 (six) hours as needed for up to 5 days for severe pain.  Marland Kitchen QUEtiapine (SEROQUEL) 25 MG tablet Take 1 tablet (25 mg total) by mouth at bedtime.   No facility-administered encounter medications on file as of 10/11/2020.     Objective:  BP Readings from Last 3 Encounters:  10/08/20 120/75  08/30/20 (!) 157/80  02/29/20 (!) 174/91    Goals Addressed              This Visit's Progress   .  RNCM- Pt: "I had a fall and broke my ribs" (pt-stated)        CARE PLAN ENTRY (see longitudinal plan of care for additional care plan information)  Current Barriers:  Marland Kitchen Knowledge Deficits related to fall precautions in patient with  . Decreased adherence to prescribed treatment for fall prevention . Knowledge Deficits related to effective pain relief measures . Care Coordination needs related to pain and discomfort  in  a patient with fall with rib fracture . Chronic Disease Management support and education needs related to pain management in patient with fall . Lacks caregiver support.  . Film/video editor.   Clinical Goal(s):  Marland Kitchen Over the next 120 days, patient will demonstrate improved adherence to prescribed treatment plan for decreasing falls as evidenced by patient reporting and review of EMR . Over the next 120 days, patient will verbalize using fall risk reduction strategies discussed . Over the next 120 days, patient will  not experience additional falls . Over the next 120 days, patient will demonstrate a decrease in pain and fall exacerbations as evidenced by pain relief and no new falls  Interventions:  . Provided written and verbal education re: Potential causes of falls and Fall prevention strategies . Reviewed medications and discussed potential side effects of medications such as dizziness and frequent urination . Assessed for s/s of orthostatic hypotension . Assessed for falls since last encounter. . Assessed patients knowledge of fall risk prevention secondary to previously provided education. . Assessed working status of life alert bracelet and patient adherence . Provided patient information for fall alert systems . Provided education to patient re: pain relief measures and letting the provider know for worse pain and discomfort.   Patient Self Care Activities:  . Utilize  (assistive device) appropriately with all ambulation- does not use assistive devices . De-clutter walkways . Change positions slowly . Wear secure fitting shoes at all times with ambulation . Utilize home lighting for dim lit areas . Have self and pet awareness at all times  Plan: . CCM RN CM will follow up in 30 to 60 days   Initial goal documentation     .  RNCM-Pt: "I can't pay 130.00 for a meter and strips" (pt-stated)        CARE PLAN ENTRY (see longtitudinal plan of care for additional care plan information)  Objective:  Lab Results  Component Value Date   HGBA1C 7.2 (H) 10/08/2020 .   Lab Results  Component Value Date   CREATININE 1.12 10/08/2020   CREATININE 1.07 02/29/2020   CREATININE 1.20 09/04/2019 .   Marland Kitchen No results found for: EGFR  Current Barriers:  Marland Kitchen Knowledge Deficits related to basic Diabetes pathophysiology and self care/management . Knowledge Deficits related to medications used for management of diabetes . Does not have glucometer to monitor blood sugar . Financial Constraints- unable to  get healthy foods, eats at [employer] a lot from [employer] . Limited Social Support . Pain and discomfort from recent fall with fractured ribs  Case Manager Clinical Goal(s):  Over the next 120 days, patient will demonstrate improved adherence to prescribed treatment plan for diabetes self care/management as evidenced by:  . daily monitoring and recording of CBG  . adherence to ADA/ carb modified diet . exercise 3/4 days/week . adherence to prescribed medication regimen  Interventions:  . Provided education to patient about basic DM disease process . Reviewed medications with patient and discussed importance of medication adherence . Discussed plans with patient for ongoing care management follow up and provided patient with direct contact information for care management team . Provided patient with written educational materials related to hypo and hyperglycemia and importance of correct treatment.  Currently the patient does not have a working meter. Hemoglobin A1C of 7.2 at MD visit on 10-08-2020.   Marland Kitchen Reviewed scheduled/upcoming provider appointments including: 11-05-2020 at 1:20 with Dr. Wynetta Emery . Advised patient, providing education and rationale, to check cbg  daily  and record, calling pcp for findings outside established parameters.   . Referral made to pharmacy team for assistance with getting a blood glucose meter that does not cost the patient.  The patient can not afford 130.00 for meter and supplies. Script sent to [facility], when he went to pick up the meter they told him his cost would be 130.00.  Marland Kitchen Referral made to community resources care guide team for assistance with help with food resources due to cost constraints  . Review of patient status, including review of consultants reports, relevant laboratory and other test results, and medications completed.  Patient Self Care Activities:  . UNABLE to independently manage DM as evidence of current hemoglobin A1C of 7.2 and  inability to check blood sugars . Checks blood sugars as prescribed and utilize hyper and hypoglycemia protocol as needed . Adheres to prescribed ADA/carb modified  Initial goal documentation     .  RNCM: Pt-"I buy what I can off the dollar menu" (pt-stated)        CARE PLAN ENTRY (see longtitudinal plan of care for additional care plan information)  Current Barriers:  . Chronic Disease Management support, education, and care coordination needs related to HTN and HLD  Clinical Goal(s) related to HTN and HLD:  Over the next 120 days, patient will:  . Work with the care management team to address educational, disease management, and care coordination needs  . Begin or continue self health monitoring activities as directed today Measure and record blood pressure 2/3 times per week and adhere to a heart healthy/ADA diet . Call provider office for new or worsened signs and symptoms Blood pressure findings outside established parameters and New or worsened symptom related to HLD/HTN and other chronic conditions. . Call care management team with questions or concerns . Verbalize basic understanding of patient centered plan of care established today  Interventions related to HTN and HLD:  . Evaluation of current treatment plans and patient's adherence to plan as established by provider . Assessed patient understanding of disease states . Assessed patient's education and care coordination needs . Provided disease specific education to patient  . Collaborated with appropriate clinical care team members regarding patient needs  Patient Self Care Activities related to HTN and HLD:  . Patient is unable to independently self-manage chronic health conditions  Initial goal documentation         Mr. Burley was given information about Chronic Care Management services today including:  1. CCM service includes personalized support from designated clinical staff supervised by his physician, including  individualized plan of care and coordination with other care providers 2. 24/7 contact phone numbers for assistance for urgent and routine care needs. 3. Service will only be billed when office clinical staff spend 20 minutes or more in a month to coordinate care. 4. Only one practitioner may furnish and bill the service in a calendar month. 5. The patient may stop CCM services at any time (effective at the end of the month) by phone call to the office staff. 6. The patient will be responsible for cost sharing (co-pay) of up to 20% of the service fee (after annual deductible is met).  Patient agreed to services and verbal consent obtained.   Plan:   Telephone follow up appointment with care management team member scheduled for: 11-01-2020 2:45 pm  Noreene Larsson RN, MSN, [facility] Mobile: [phone]

## 2020-10-11 NOTE — Patient Instructions (Signed)
Visit Information  Goals Addressed              This Visit's Progress   .  RNCM- Pt: "I had a fall and broke my ribs" (pt-stated)        CARE PLAN ENTRY (see longitudinal plan of care for additional care plan information)  Current Barriers:  Marland Kitchen Knowledge Deficits related to fall precautions in patient with  . Decreased adherence to prescribed treatment for fall prevention . Knowledge Deficits related to effective pain relief measures . Care Coordination needs related to pain and discomfort  in a patient with fall with rib fracture . Chronic Disease Management support and education needs related to pain management in patient with fall . Lacks caregiver support.  . Film/video editor.   Clinical Goal(s):  Marland Kitchen Over the next 120 days, patient will demonstrate improved adherence to prescribed treatment plan for decreasing falls as evidenced by patient reporting and review of EMR . Over the next 120 days, patient will verbalize using fall risk reduction strategies discussed . Over the next 120 days, patient will not experience additional falls . Over the next 120 days, patient will demonstrate a decrease in pain and fall exacerbations as evidenced by pain relief and no new falls  Interventions:  . Provided written and verbal education re: Potential causes of falls and Fall prevention strategies . Reviewed medications and discussed potential side effects of medications such as dizziness and frequent urination . Assessed for s/s of orthostatic hypotension . Assessed for falls since last encounter. . Assessed patients knowledge of fall risk prevention secondary to previously provided education. . Assessed working status of life alert bracelet and patient adherence . Provided patient information for fall alert systems . Provided education to patient re: pain relief measures and letting the provider know for worse pain and discomfort.   Patient Self Care Activities:  . Utilize  (assistive  device) appropriately with all ambulation- does not use assistive devices . De-clutter walkways . Change positions slowly . Wear secure fitting shoes at all times with ambulation . Utilize home lighting for dim lit areas . Have self and pet awareness at all times  Plan: . CCM RN CM will follow up in 30 to 60 days   Initial goal documentation     .  RNCM-Pt: "I can't pay 130.00 for a meter and strips" (pt-stated)        CARE PLAN ENTRY (see longtitudinal plan of care for additional care plan information)  Objective:  Lab Results  Component Value Date   HGBA1C 7.2 (H) 10/08/2020 .   Lab Results  Component Value Date   CREATININE 1.12 10/08/2020   CREATININE 1.07 02/29/2020   CREATININE 1.20 09/04/2019 .   Marland Kitchen No results found for: EGFR  Current Barriers:  Marland Kitchen Knowledge Deficits related to basic Diabetes pathophysiology and self care/management . Knowledge Deficits related to medications used for management of diabetes . Does not have glucometer to monitor blood sugar . Financial Constraints- unable to get healthy foods, eats at [employer] a lot from [employer] . Limited Social Support . Pain and discomfort from recent fall with fractured ribs  Case Manager Clinical Goal(s):  Over the next 120 days, patient will demonstrate improved adherence to prescribed treatment plan for diabetes self care/management as evidenced by:  . daily monitoring and recording of CBG  . adherence to ADA/ carb modified diet . exercise 3/4 days/week . adherence to prescribed medication regimen  Interventions:  . Provided education to  patient about basic DM disease process . Reviewed medications with patient and discussed importance of medication adherence . Discussed plans with patient for ongoing care management follow up and provided patient with direct contact information for care management team . Provided patient with written educational materials related to hypo and hyperglycemia and  importance of correct treatment.  Currently the patient does not have a working meter. Hemoglobin A1C of 7.2 at MD visit on 10-08-2020.   Marland Kitchen Reviewed scheduled/upcoming provider appointments including: 11-05-2020 at 1:20 with Dr. Wynetta Emery . Advised patient, providing education and rationale, to check cbg daily  and record, calling pcp for findings outside established parameters.   . Referral made to pharmacy team for assistance with getting a blood glucose meter that does not cost the patient.  The patient can not afford 130.00 for meter and supplies. Script sent to [facility], when he went to pick up the meter they told him his cost would be 130.00.  Marland Kitchen Referral made to community resources care guide team for assistance with help with food resources due to cost constraints  . Review of patient status, including review of consultants reports, relevant laboratory and other test results, and medications completed.  Patient Self Care Activities:  . UNABLE to independently manage DM as evidence of current hemoglobin A1C of 7.2 and inability to check blood sugars . Checks blood sugars as prescribed and utilize hyper and hypoglycemia protocol as needed . Adheres to prescribed ADA/carb modified  Initial goal documentation     .  RNCM: Pt-"I buy what I can off the dollar menu" (pt-stated)        CARE PLAN ENTRY (see longtitudinal plan of care for additional care plan information)  Current Barriers:  . Chronic Disease Management support, education, and care coordination needs related to HTN and HLD  Clinical Goal(s) related to HTN and HLD:  Over the next 120 days, patient will:  . Work with the care management team to address educational, disease management, and care coordination needs  . Begin or continue self health monitoring activities as directed today Measure and record blood pressure 2/3 times per week and adhere to a heart healthy/ADA diet . Call provider office for new or worsened signs and  symptoms Blood pressure findings outside established parameters and New or worsened symptom related to HLD/HTN and other chronic conditions. . Call care management team with questions or concerns . Verbalize basic understanding of patient centered plan of care established today  Interventions related to HTN and HLD:  . Evaluation of current treatment plans and patient's adherence to plan as established by provider . Assessed patient understanding of disease states . Assessed patient's education and care coordination needs . Provided disease specific education to patient  . Collaborated with appropriate clinical care team members regarding patient needs  Patient Self Care Activities related to HTN and HLD:  . Patient is unable to independently self-manage chronic health conditions  Initial goal documentation        Mr. Mullen was given information about Chronic Care Management services today including:  1. CCM service includes personalized support from designated clinical staff supervised by his physician, including individualized plan of care and coordination with other care providers 2. 24/7 contact phone numbers for assistance for urgent and routine care needs. 3. Service will only be billed when office clinical staff spend 20 minutes or more in a month to coordinate care. 4. Only one practitioner may furnish and bill the service in a calendar month. 5.  The patient may stop CCM services at any time (effective at the end of the month) by phone call to the office staff. 6. The patient will be responsible for cost sharing (co-pay) of up to 20% of the service fee (after annual deductible is met).  Patient agreed to services and verbal consent obtained.   Patient verbalizes understanding of instructions provided today.   Telephone follow up appointment with care management team member scheduled for: 11-01-2020 at 245pm  [place], MSN, [facility] Mobile: [phone]

## 2020-10-15 ENCOUNTER — Telehealth: Payer: Self-pay | Admitting: Family Medicine

## 2020-10-15 DIAGNOSIS — I10 Essential (primary) hypertension: Secondary | ICD-10-CM | POA: Diagnosis not present

## 2020-10-15 DIAGNOSIS — S2241XD Multiple fractures of ribs, right side, subsequent encounter for fracture with routine healing: Secondary | ICD-10-CM | POA: Diagnosis not present

## 2020-10-15 DIAGNOSIS — I2510 Atherosclerotic heart disease of native coronary artery without angina pectoris: Secondary | ICD-10-CM | POA: Diagnosis not present

## 2020-10-15 DIAGNOSIS — I69398 Other sequelae of cerebral infarction: Secondary | ICD-10-CM | POA: Diagnosis not present

## 2020-10-15 DIAGNOSIS — S36892D Contusion of other intra-abdominal organs, subsequent encounter: Secondary | ICD-10-CM | POA: Diagnosis not present

## 2020-10-15 DIAGNOSIS — E119 Type 2 diabetes mellitus without complications: Secondary | ICD-10-CM | POA: Diagnosis not present

## 2020-10-15 NOTE — Telephone Encounter (Signed)
   SF 10/15/2020   Name: Lucas Weiss   MRN: [id number]   DOB: Nov 10, 1956   AGE: 64 y.o.   GENDER: male   PCP Olevia Perches P, DO.   Spoke with patient today regarding referral for assistance with food insecurities. Patient stated that he is still in need. [name] also stated that right now he is receiving help with food from his family. Informed patient that he may be able to receive assistance from the [facility] to assist with food. Patient stated that he would like to place a referral to [facility]. Informed patient that along with completing the referral to [facility] Guide will also send him a list of food pantries within the [facility] area. Patient stated understanding. Will complete referral for [facility] and give patient a follow up call within the week.    [facility] Care Guide, Embedded Care Coordination [facility], Care Management Phone: [phone] Email: [email or web address]

## 2020-10-17 ENCOUNTER — Inpatient Hospital Stay: Payer: Medicare Other | Admitting: Unknown Physician Specialty

## 2020-10-21 ENCOUNTER — Ambulatory Visit: Payer: Self-pay | Admitting: *Deleted

## 2020-10-21 NOTE — Telephone Encounter (Signed)
Patient had a fall and has broken ribs- he states he is better- but he is still having pain with breathing and is having trouble sleeping at night. Patient also states he has a knot below the IV site that was in hand- is is sore.  Reason for Disposition . [1] Pain at IV site or shooting up arm AND [2] NO redness or swelling AND [3] IV has been removed . [1] MODERATE longstanding difficulty breathing (e.g., speaks in phrases, SOB even at rest, pulse 100-120) AND [2] SAME as normal  Answer Assessment - Initial Assessment Questions 1. RESPIRATORY STATUS: "Describe your breathing?" (e.g., wheezing, shortness of breath, unable to speak, severe coughing)      Patient broke ribs 3 week ago and has had trouble breathing 2. ONSET: "When did this breathing problem begin?"      2 weeks 3. PATTERN "Does the difficult breathing come and go, or has it been constant since it started?"      Constant- painful- patient has pain and has swallow breathing 4. SEVERITY: "How bad is your breathing?" (e.g., mild, moderate, severe)    - MILD: No SOB at rest, mild SOB with walking, speaks normally in sentences, can lay down, no retractions, pulse < 100.    - MODERATE: SOB at rest, SOB with minimal exertion and prefers to sit, cannot lie down flat, speaks in phrases, mild retractions, audible wheezing, pulse 100-120.    - SEVERE: Very SOB at rest, speaks in single words, struggling to breathe, sitting hunched forward, retractions, pulse > 120     Mild/moderate 5. RECURRENT SYMPTOM: "Have you had difficulty breathing before?" If Yes, ask: "When was the last time?" and "What happened that time?"      Injury related 6. CARDIAC HISTORY: "Do you have any history of heart disease?" (e.g., heart attack, angina, bypass surgery, angioplasty)      no 7. LUNG HISTORY: "Do you have any history of lung disease?"  (e.g., pulmonary embolus, asthma, emphysema)     no 8. CAUSE: "What do you think is causing the breathing problem?"       Broken ribs 9. OTHER SYMPTOMS: "Do you have any other symptoms? (e.g., dizziness, runny nose, cough, chest pain, fever)     no 10. PREGNANCY: "Is there any chance you are pregnant?" "When was your last menstrual period?"       n/a 11. TRAVEL: "Have you traveled out of the country in the last month?" (e.g., travel history, exposures)       no  Answer Assessment - Initial Assessment Questions 1. SYMPTOM:  "What's the main symptom you're concerned about?" (e.g., pain, redness, swelling, pus)     Knot below IV site in hand 2. ONSET: "When did the knot  start?"     Today- noticed today 3. IV WHAT: "What kind of IV line do you have?" (e.g., central line, PICC, peripheral IV)     Peripheral IV 4. IV WHERE: "Where does the IV enter your body?"     L hand- removed 2 weeks ago 5. IV WHEN: "When was this IV put in?"     2 weeks ago 6. IV WHY: "Why do you have this IV line?"     Medication /in hospital 7. IV RUNNING OK: "Is the IV running OK?" (e.g., running OK, running slowly, not running, unable to flush)      n/a 8. PAIN: "Is there any pain?" If Yes, ask: "How bad is the pain?"   (e.g., scale 1-10;  or mild, moderate, severe)     Yes- stings when he moves hand 9. OTHER SYMPTOMS: "Do you have any other symptoms?" (e.g., fever, shaking chills, new weakness, pus)     no 10. VISITING NURSE: "Do you have a visiting nurse?" (e.g., home health nurse, IV infusion nurse)       no  Protocols used: IV SITE (SKIN) SYMPTOMS-A-AH, BREATHING DIFFICULTY-A-AH

## 2020-10-21 NOTE — Telephone Encounter (Signed)
Noted  

## 2020-10-22 ENCOUNTER — Encounter: Payer: Self-pay | Admitting: Nurse Practitioner

## 2020-10-22 ENCOUNTER — Ambulatory Visit
Admission: RE | Admit: 2020-10-22 | Discharge: 2020-10-22 | Disposition: A | Discharge status: Home / Self Care | Payer: Medicare Other | Source: Ambulatory Visit | Attending: Nurse Practitioner | Admitting: Nurse Practitioner

## 2020-10-22 ENCOUNTER — Ambulatory Visit
Admission: RE | Admit: 2020-10-22 | Discharge: 2020-10-22 | Disposition: A | Discharge status: Home / Self Care | Payer: Medicare Other | Attending: Nurse Practitioner | Admitting: Nurse Practitioner

## 2020-10-22 ENCOUNTER — Ambulatory Visit (INDEPENDENT_AMBULATORY_CARE_PROVIDER_SITE_OTHER): Payer: Medicare Other | Admitting: Nurse Practitioner

## 2020-10-22 ENCOUNTER — Other Ambulatory Visit: Payer: Self-pay

## 2020-10-22 VITALS — BP 121/78 | HR 80 | Temp 98.6°F | Ht 71.0 in | Wt 176.2 lb

## 2020-10-22 DIAGNOSIS — T148XXA Other injury of unspecified body region, initial encounter: Secondary | ICD-10-CM | POA: Diagnosis not present

## 2020-10-22 DIAGNOSIS — I69398 Other sequelae of cerebral infarction: Secondary | ICD-10-CM | POA: Diagnosis not present

## 2020-10-22 DIAGNOSIS — I2510 Atherosclerotic heart disease of native coronary artery without angina pectoris: Secondary | ICD-10-CM | POA: Diagnosis not present

## 2020-10-22 DIAGNOSIS — S2241XD Multiple fractures of ribs, right side, subsequent encounter for fracture with routine healing: Secondary | ICD-10-CM | POA: Insufficient documentation

## 2020-10-22 DIAGNOSIS — I10 Essential (primary) hypertension: Secondary | ICD-10-CM | POA: Diagnosis not present

## 2020-10-22 DIAGNOSIS — I25119 Atherosclerotic heart disease of native coronary artery with unspecified angina pectoris: Secondary | ICD-10-CM

## 2020-10-22 DIAGNOSIS — E119 Type 2 diabetes mellitus without complications: Secondary | ICD-10-CM | POA: Diagnosis not present

## 2020-10-22 DIAGNOSIS — S36892D Contusion of other intra-abdominal organs, subsequent encounter: Secondary | ICD-10-CM | POA: Diagnosis not present

## 2020-10-22 MED ORDER — OXYCODONE HCL 5 MG PO TABS
5.0000 mg | ORAL_TABLET | Freq: Four times a day (QID) | ORAL | 0 refills | Status: AC | PRN
Start: 2020-10-22 — End: 2020-10-27

## 2020-10-22 NOTE — Progress Notes (Signed)
BP 121/78   Pulse 80   Temp 98.6 F (37 C) (Oral)   Ht 5\' 11"  (1.803 m)   Wt 176 lb 3.2 oz (79.9 kg)   SpO2 99%   BMI 24.57 kg/m    Subjective:    Patient ID: Lucas Weiss, male    DOB: [birth year], 64 y.o.   MRN: [id number]  HPI: Lucas Weiss is a 64 y.o. male  Chief Complaint  Patient presents with  . knot on Left wrist    had a previous IV on 10/8 during hosp. visit from a fall off deck  . Broken ribs    still having pain breathing, and all over taking oxycodone for pain   RIB PAIN  Follow-up for ongoing rib pain, last seen 10/08/20.  Was admitted to [facility] -- slipped off deck pushing a board over and fractured multiple ribs, on review of [facility] notes and imaging -- CXR small bilat pleural effusion + fracture posterior right 10th through 12th ribs and hematoma to mesenteric area.  He was sent home with Oxycodone 5 MG IR, which he reports benefit with and this was refilled last visit for short period -- last fill 10/08/20.  He endorses ongoing pain to site of fractures, about a 6/10 at worst, for which medication helps.  Denies SOB, CP, cough, hemoptysis, edema, or fatigue.  Is performing deep breathing exercises at home with spirometer.  Reports Lidocaine patches are not working on ribs.  Reiterated to him need to attempt to use Lidocaine patches vs ongoing opioid, he is aware will refill for only short period.  States "I have thought about doubling up on it", which advised not to.  Also states "well I could always get it off the street", advised not to do this. Present dose:  Morphine equivalents Pain control status: stable Duration: months Location: right ribs Quality: dull and aching Current Pain Level: 8/10 Previous Pain Level: 8/10 Benefit from narcotic medications: yes What Activities task can be accomplished with current medication? Can do ADLs with Oxycodone Non-narcotic analgesic meds: reports he can not take Tylenol or Ibuprofen as it messes stomach  up  SKIN KNOT Reports ongoing knot to area of IV placed at [facility] on 09/27/20.  He wanted assessed due to history of blood clots in legs.  He reports area is a little tender to touch. Duration: weeks Location: left forearm History of trauma in area: yes Pain: yes Severity: mild Redness: no Swelling: no swelling, but knot Status: stable  Relevant past medical, surgical, family and social history reviewed and updated as indicated. Interim medical history since our last visit reviewed. Allergies and medications reviewed and updated.  Review of Systems  Constitutional: Negative for activity change, diaphoresis, fatigue and fever.  Respiratory: Negative for cough, chest tightness, shortness of breath and wheezing.   Cardiovascular: Negative for chest pain, palpitations and leg swelling.  Gastrointestinal: Negative.   Endocrine: Negative for polydipsia, polyphagia and polyuria.  Musculoskeletal: Positive for arthralgias.  Neurological: Negative.   Psychiatric/Behavioral: Negative.     Per HPI unless specifically indicated above     Objective:    BP 121/78   Pulse 80   Temp 98.6 F (37 C) (Oral)   Ht 5\' 11"  (1.803 m)   Wt 176 lb 3.2 oz (79.9 kg)   SpO2 99%   BMI 24.57 kg/m   Wt Readings from Last 3 Encounters:  10/22/20 176 lb 3.2 oz (79.9 kg)  10/08/20 182 lb 9.6 oz (82.8 kg)  08/30/20 185 lb (83.9 kg)    Physical Exam Vitals and nursing note reviewed.  Constitutional:      General: He is awake. He is not in acute distress.    Appearance: He is well-developed and well-groomed. He is not ill-appearing or toxic-appearing.  HENT:     Head: Normocephalic and atraumatic.     Right Ear: Hearing normal. No drainage.     Left Ear: Hearing normal. No drainage.  Eyes:     General: Lids are normal.        Right eye: No discharge.        Left eye: No discharge.     Conjunctiva/sclera: Conjunctivae normal.     Pupils: Pupils are equal, round, and reactive to light.  Neck:      Thyroid: No thyromegaly.     Vascular: No carotid bruit.     Trachea: Trachea normal.  Cardiovascular:     Rate and Rhythm: Normal rate and regular rhythm.     Pulses:          Radial pulses are 2+ on the right side and 2+ on the left side.       Femoral pulses are 2+ on the right side and 2+ on the left side.    Heart sounds: Normal heart sounds, S1 normal and S2 normal. No murmur heard.  No gallop.      Comments: Small firm knot to anterior left forearm, approx 1 cm in size over vessel.  No erythema, warmth, or tenderness to touch.  Normal pulses. Pulmonary:     Effort: Pulmonary effort is normal. No accessory muscle usage or respiratory distress.     Breath sounds: Normal breath sounds. No decreased breath sounds.     Comments: Mild tenderness on palpation over right lower ribs, none to left. Abdominal:     General: Bowel sounds are normal.     Palpations: Abdomen is soft.  Musculoskeletal:        General: Normal range of motion.     Cervical back: Normal range of motion and neck supple.     Right lower leg: No edema.     Left lower leg: No edema.  Skin:    General: Skin is warm and dry.     Capillary Refill: Capillary refill takes less than 2 seconds.     Findings: No rash.       Neurological:     Mental Status: He is alert and oriented to person, place, and time.     Deep Tendon Reflexes: Reflexes are normal and symmetric.  Psychiatric:        Attention and Perception: Attention normal.        Mood and Affect: Mood normal.        Speech: Speech normal.        Behavior: Behavior normal. Behavior is cooperative.        Thought Content: Thought content normal.     Results for orders placed or performed in visit on 10/08/20  CBC with Differential/Platelet  Result Value Ref Range   WBC 7.1 3.4 - 10.8 x10E3/uL   RBC 4.26 4.14 - 5.80 x10E6/uL   Hemoglobin 11.7 (L) 13.0 - 17.7 g/dL   Hematocrit 54.2 (L) 70.6 - 51.0 %   MCV 86 79 - 97 fL   MCH 27.5 26.6 - 33.0 pg   MCHC  31.9 31 - 35 g/dL   RDW 23.7 62.8 - 31.5 %   Platelets 327 150 - 450 x10E3/uL  Neutrophils 59 Not Estab. %   Lymphs 27 Not Estab. %   Monocytes 8 Not Estab. %   Eos 5 Not Estab. %   Basos 1 Not Estab. %   Neutrophils Absolute 4.1 1.40 - 7.00 x10E3/uL   Lymphocytes Absolute 1.9 0 - 3 x10E3/uL   Monocytes Absolute 0.6 0 - 0 x10E3/uL   EOS (ABSOLUTE) 0.4 0.0 - 0.4 x10E3/uL   Basophils Absolute 0.1 0 - 0 x10E3/uL   Immature Granulocytes 0 Not Estab. %   Immature Grans (Abs) 0.0 0.0 - 0.1 x10E3/uL  Bayer DCA Hb A1c Waived  Result Value Ref Range   HB A1C (BAYER DCA - WAIVED) 7.2 (H) <7.0 %  Microalbumin, Urine Waived  Result Value Ref Range   Microalb, Ur Waived 150 (H) 0 - 19 mg/L   Creatinine, Urine Waived 100 10 - 300 mg/dL   Microalb/Creat Ratio >300 (H) <30 mg/g  Basic metabolic panel  Result Value Ref Range   Glucose 110 (H) 65 - 99 mg/dL   BUN 19 8 - 27 mg/dL   Creatinine, Ser 7.25 0.76 - 1.27 mg/dL   GFR calc non Af Amer 70 >59 mL/min/1.73   GFR calc Af Amer 81 >59 mL/min/1.73   BUN/Creatinine Ratio 17 10 - 24   Sodium 139 134 - 144 mmol/L   Potassium 4.6 3.5 - 5.2 mmol/L   Chloride 104 96 - 106 mmol/L   CO2 20 20 - 29 mmol/L   Calcium 9.0 8.6 - 10.2 mg/dL  TSH  Result Value Ref Range   TSH 1.440 0.450 - 4.500 uIU/mL  Lipid Panel w/o Chol/HDL Ratio  Result Value Ref Range   Cholesterol, Total 179 100 - 199 mg/dL   Triglycerides 366 (H) 0 - 149 mg/dL   HDL 38 (L) >44 mg/dL   VLDL Cholesterol Cal 45 (H) 5 - 40 mg/dL   LDL Chol Calc (NIH) 96 0 - 99 mg/dL      Assessment & Plan:   Problem List Items Addressed This Visit      Musculoskeletal and Integument   Multiple rib fractures    Acute with ongoing discomfort.  Have ordered repeat imaging to assess area of fractures.  Will send in short refill of pain medication, PDMP reviewed.  Oxycodone refill sent for 5 days, #20 pills with no refills -- he is aware this is last fill on opioid and if ongoing pain may need  to consider pain management referral.  Recommend he continue incentive spirometer frequently at home and apply ice & heat to area of discomfort as needed.  May also use Voltaren or Icy/Hot gel.  Recommend he utilize Lidocaine patches, which he has at home, daily, applying to area of discomfort.  He is aware opioid is short term use only.  Return in 4 weeks for follow-up with PCP.      Relevant Orders   DG Ribs Unilateral Right    Other Visit Diagnoses    Hematoma    -  Primary   Small area to previous IV site, have recommended he apply ice to area if any discomfort.  Overall exam WNL.  If changes to alert provider.       Follow up plan: Return in about 4 weeks (around 11/19/2020) for Rib pain -- with PCP.

## 2020-10-22 NOTE — Assessment & Plan Note (Signed)
Acute with ongoing discomfort.  Have ordered repeat imaging to assess area of fractures.  Will send in short refill of pain medication, PDMP reviewed.  Oxycodone refill sent for 5 days, #20 pills with no refills -- he is aware this is last fill on opioid and if ongoing pain may need to consider pain management referral.  Recommend he continue incentive spirometer frequently at home and apply ice & heat to area of discomfort as needed.  May also use Voltaren or Icy/Hot gel.  Recommend he utilize Lidocaine patches, which he has at home, daily, applying to area of discomfort.  He is aware opioid is short term use only.  Return in 4 weeks for follow-up with PCP.

## 2020-10-22 NOTE — Patient Instructions (Signed)
IMAGING Center -- go to [address] [place], [place] [address]   Rib Fracture  A rib fracture is a break or crack in one of the bones of the ribs. The ribs are like a cage that goes around your upper chest. A broken or cracked rib is often painful, but most do not cause other problems. Most rib fractures usually heal on their own in 1-3 months. Follow these instructions at home: Managing pain, stiffness, and swelling  If directed, apply ice to the injured area. ? Put ice in a plastic bag. ? Place a towel between your skin and the bag. ? Leave the ice on for 20 minutes, 2-3 times a day.  Take over-the-counter and prescription medicines only as told by your doctor. Activity  Avoid activities that cause pain to the injured area. Protect your injured area.  Slowly increase activity as told by your doctor. General instructions  Do deep breathing as told by your doctor. You may be told to: ? Take deep breaths many times a day. ? Cough many times a day while hugging a pillow. ? Use a device (incentive spirometer) to do deep breathing many times a day.  Drink enough fluid to keep your pee (urine) clear or pale yellow.  Do not wear a rib belt or binder. These do not allow you to breathe deeply.  Keep all follow-up visits as told by your doctor. This is important. Contact a doctor if:  You have a fever. Get help right away if:  You have trouble breathing.  You are short of breath.  You cannot stop coughing.  You cough up thick or bloody spit (sputum).  You feel sick to your stomach (nauseous), throw up (vomit), or have belly (abdominal) pain.  Your pain gets worse and medicine does not help. Summary  A rib fracture is a break or crack in one of the bones of the ribs.  Apply ice to the injured area and take medicines for pain as told by your doctor.  Take deep breaths and cough many times a day. Hug a pillow every time you cough. This information is not intended to replace  advice given to you by your health care provider. Make sure you discuss any questions you have with your health care provider. Document Revised: 11/19/2017 Document Reviewed: 03/09/2017 Elsevier Patient Education  2020 [employer].

## 2020-10-22 NOTE — Telephone Encounter (Signed)
° °  SF 10/22/2020    Name: Lucas Weiss    MRN: [id number]    DOB: [birth year]    AGE: 64 y.o.    GENDER: male    PCP Olevia Perches P, DO.   Called Lucas Weiss regarding referral. Patient did not answer. Will try to contact patient again to let him know that he will be receiving both a gas card and [employer] gift card.    [facility] Care Guide, Embedded Care Coordination [facility], Care Management Phone: [phone] Email: [email or web address]

## 2020-10-24 ENCOUNTER — Telehealth: Payer: Self-pay | Admitting: Family Medicine

## 2020-10-24 NOTE — Telephone Encounter (Signed)
Received e-mail confirmation from [employer]) that Lucas Weiss stopped by the office to pick up his $150 gift card for food and $20 gas card. Originally patient needed assistance with food and gas. Care Guide sent referral to [facility] and patient was approved for both a [employer] gift card and gas card. No additional needs at this time.   Closing referral pending any other needs of patient.

## 2020-10-24 NOTE — Progress Notes (Signed)
Good morning, please let Lucas Weiss know his imaging shows that the fractures of his ribs are healing well.  Pain should be improving.  If any questions let me know.  Have a great day!!

## 2020-11-01 ENCOUNTER — Telehealth: Payer: Self-pay

## 2020-11-01 ENCOUNTER — Telehealth: Payer: Self-pay | Admitting: General Practice

## 2020-11-01 NOTE — Telephone Encounter (Signed)
  Chronic Care Management   Outreach Note  11/01/2020 Name: DENI LEFEVER MRN: [id number] DOB: 04-05-1956  Referred by: Dorcas Carrow, DO Reason for referral : Chronic Care Management (RNCM follow up call for Chronic Disease Managmeent and Care Coordination Needs )   An unsuccessful telephone outreach was attempted today. The patient was referred to the case management team for assistance with care management and care coordination.   Follow Up Plan: A HIPAA compliant phone message was left for the patient providing contact information and requesting a return call.   Alto Denver RN, MSN, CCM Community Care Coordinator [place]  [facility] Mobile: [phone]

## 2020-11-05 ENCOUNTER — Encounter: Payer: Self-pay | Admitting: Family Medicine

## 2020-11-05 ENCOUNTER — Ambulatory Visit (INDEPENDENT_AMBULATORY_CARE_PROVIDER_SITE_OTHER): Payer: Medicare Other | Admitting: Family Medicine

## 2020-11-05 ENCOUNTER — Other Ambulatory Visit: Payer: Self-pay

## 2020-11-05 VITALS — BP 125/72 | HR 79 | Temp 99.0°F | Ht 70.0 in | Wt 180.0 lb

## 2020-11-05 DIAGNOSIS — S2241XD Multiple fractures of ribs, right side, subsequent encounter for fracture with routine healing: Secondary | ICD-10-CM | POA: Diagnosis not present

## 2020-11-05 DIAGNOSIS — I25119 Atherosclerotic heart disease of native coronary artery with unspecified angina pectoris: Secondary | ICD-10-CM | POA: Diagnosis not present

## 2020-11-05 MED ORDER — EMPAGLIFLOZIN 25 MG PO TABS
25.0000 mg | ORAL_TABLET | Freq: Every day | ORAL | 0 refills | Status: DC
Start: 2020-11-05 — End: 2020-12-27

## 2020-11-05 MED ORDER — LISINOPRIL 40 MG PO TABS
40.0000 mg | ORAL_TABLET | Freq: Every day | ORAL | 0 refills | Status: DC
Start: 2020-11-05 — End: 2020-12-27

## 2020-11-05 NOTE — Assessment & Plan Note (Signed)
Feeling better. Lungs clear. Continue to monitor. Will call with any concerns.

## 2020-11-05 NOTE — Progress Notes (Signed)
BP 125/72   Pulse 79   Temp 99 F (37.2 C) (Oral)   Ht 5\' 10"  (1.778 m)   Wt 180 lb (81.6 kg)   SpO2 97%   BMI 25.83 kg/m    Subjective:    Patient ID: [name], male    DOB: [birth year], 64 y.o.   MRN: [id number]  HPI: Lucas Weiss is a 64 y.o. male  Chief Complaint  Patient presents with  . Rib Fracture    4 week follow up   68 down about a month ago and fractured a couple ribs. He is feeling significantly better. He notes that he wasn't feeling too well to start with, but he is not having trouble breathing any more. No SOB, no wheezing. Continues with some pain. He is otherwise feeling well. No other concerns.  Relevant past medical, surgical, family and social history reviewed and updated as indicated. Interim medical history since our last visit reviewed. Allergies and medications reviewed and updated.  Review of Systems  Constitutional: Negative.   Respiratory: Negative.   Cardiovascular: Negative.   Gastrointestinal: Negative.   Musculoskeletal: Positive for back pain and myalgias. Negative for arthralgias, gait problem, joint swelling, neck pain and neck stiffness.  Skin: Negative.   Neurological: Negative.   Psychiatric/Behavioral: Negative.     Per HPI unless specifically indicated above     Objective:    BP 125/72   Pulse 79   Temp 99 F (37.2 C) (Oral)   Ht 5\' 10"  (1.778 m)   Wt 180 lb (81.6 kg)   SpO2 97%   BMI 25.83 kg/m   Wt Readings from Last 3 Encounters:  11/05/20 180 lb (81.6 kg)  10/22/20 176 lb 3.2 oz (79.9 kg)  10/08/20 182 lb 9.6 oz (82.8 kg)    Physical Exam Vitals and nursing note reviewed.  Constitutional:      General: He is not in acute distress.    Appearance: Normal appearance. He is not ill-appearing, toxic-appearing or diaphoretic.  HENT:     Head: Normocephalic and atraumatic.     Right Ear: External ear normal.     Left Ear: External ear normal.     Nose: Nose normal.     Mouth/Throat:     Mouth: Mucous  membranes are moist.     Pharynx: Oropharynx is clear.  Eyes:     General: No scleral icterus.       Right eye: No discharge.        Left eye: No discharge.     Extraocular Movements: Extraocular movements intact.     Conjunctiva/sclera: Conjunctivae normal.     Pupils: Pupils are equal, round, and reactive to light.  Cardiovascular:     Rate and Rhythm: Normal rate and regular rhythm.     Pulses: Normal pulses.     Heart sounds: Normal heart sounds. No murmur heard.  No friction rub. No gallop.   Pulmonary:     Effort: Pulmonary effort is normal. No respiratory distress.     Breath sounds: Normal breath sounds. No stridor. No wheezing, rhonchi or rales.  Chest:     Chest wall: No tenderness.  Musculoskeletal:        General: Normal range of motion.     Cervical back: Normal range of motion and neck supple.  Skin:    General: Skin is warm and dry.     Capillary Refill: Capillary refill takes less than 2 seconds.     Coloration: Skin  is not jaundiced or pale.     Findings: No bruising, erythema, lesion or rash.  Neurological:     General: No focal deficit present.     Mental Status: He is alert and oriented to person, place, and time. Mental status is at baseline.  Psychiatric:        Mood and Affect: Mood normal.        Behavior: Behavior normal.        Thought Content: Thought content normal.        Judgment: Judgment normal.     Results for orders placed or performed in visit on 10/08/20  CBC with Differential/Platelet  Result Value Ref Range   WBC 7.1 3.4 - 10.8 x10E3/uL   RBC 4.26 4.14 - 5.80 x10E6/uL   Hemoglobin 11.7 (L) 13.0 - 17.7 g/dL   Hematocrit 97.3 (L) 53.2 - 51.0 %   MCV 86 79 - 97 fL   MCH 27.5 26.6 - 33.0 pg   MCHC 31.9 31 - 35 g/dL   RDW 99.2 42.6 - 83.4 %   Platelets 327 150 - 450 x10E3/uL   Neutrophils 59 Not Estab. %   Lymphs 27 Not Estab. %   Monocytes 8 Not Estab. %   Eos 5 Not Estab. %   Basos 1 Not Estab. %   Neutrophils Absolute 4.1 1.40  - 7.00 x10E3/uL   Lymphocytes Absolute 1.9 0 - 3 x10E3/uL   Monocytes Absolute 0.6 0 - 0 x10E3/uL   EOS (ABSOLUTE) 0.4 0.0 - 0.4 x10E3/uL   Basophils Absolute 0.1 0 - 0 x10E3/uL   Immature Granulocytes 0 Not Estab. %   Immature Grans (Abs) 0.0 0.0 - 0.1 x10E3/uL  Bayer DCA Hb A1c Waived  Result Value Ref Range   HB A1C (BAYER DCA - WAIVED) 7.2 (H) <7.0 %  Microalbumin, Urine Waived  Result Value Ref Range   Microalb, Ur Waived 150 (H) 0 - 19 mg/L   Creatinine, Urine Waived 100 10 - 300 mg/dL   Microalb/Creat Ratio >300 (H) <30 mg/g  Basic metabolic panel  Result Value Ref Range   Glucose 110 (H) 65 - 99 mg/dL   BUN 19 8 - 27 mg/dL   Creatinine, Ser 1.96 0.76 - 1.27 mg/dL   GFR calc non Af Amer 70 >59 mL/min/1.73   GFR calc Af Amer 81 >59 mL/min/1.73   BUN/Creatinine Ratio 17 10 - 24   Sodium 139 134 - 144 mmol/L   Potassium 4.6 3.5 - 5.2 mmol/L   Chloride 104 96 - 106 mmol/L   CO2 20 20 - 29 mmol/L   Calcium 9.0 8.6 - 10.2 mg/dL  TSH  Result Value Ref Range   TSH 1.440 0.450 - 4.500 uIU/mL  Lipid Panel w/o Chol/HDL Ratio  Result Value Ref Range   Cholesterol, Total 179 100 - 199 mg/dL   Triglycerides 222 (H) 0 - 149 mg/dL   HDL 38 (L) >97 mg/dL   VLDL Cholesterol Cal 45 (H) 5 - 40 mg/dL   LDL Chol Calc (NIH) 96 0 - 99 mg/dL      Assessment & Plan:   Problem List Items Addressed This Visit      Musculoskeletal and Integument   Multiple rib fractures - Primary    Feeling better. Lungs clear. Continue to monitor. Will call with any concerns.          Follow up plan: Return Early january for DM follow up.

## 2020-11-25 ENCOUNTER — Other Ambulatory Visit: Payer: Self-pay | Admitting: Family Medicine

## 2020-11-28 ENCOUNTER — Telehealth: Payer: Self-pay

## 2020-11-28 NOTE — Chronic Care Management (AMB) (Signed)
°  Care Management   Note  11/28/2020 Name: Lucas Weiss MRN: [id number] DOB: [birth year]  Lucas Weiss is a 64 y.o. year old male who is a primary care patient of Dorcas Carrow, DO and is actively engaged with the care management team. I reached out to Lucas Weiss by phone today to assist with re-scheduling a follow up visit with the RN Case Manager  Follow up plan: Unsuccessful telephone outreach attempt made. A HIPAA compliant phone message was left for the patient providing contact information and requesting a return call.  The care management team will reach out to the patient again over the next 7 days.  If patient returns call to provider office, please advise to call Embedded Care Management Care Guide Lucas Weiss  at [phone]  Lucas Weiss, RMA Care Guide, Embedded Care Coordination [facility]  [place], [place] [address] Direct Dial: [phone] [email or web address]: [email or web address]

## 2020-12-04 NOTE — Telephone Encounter (Signed)
Pt has been r/s  

## 2020-12-04 NOTE — Chronic Care Management (AMB) (Signed)
°  Care Management   Note  12/04/2020 Name: Lucas Weiss MRN: [id number] DOB: Oct 06, 1956  GERAMY LAMORTE is a 64 y.o. year old male who is a primary care patient of Dorcas Carrow, DO and is actively engaged with the care management team. I reached out to [name] by phone today to assist with re-scheduling a follow up visit with the RN Case Manager  Follow up plan: Telephone appointment with care management team member scheduled for:01/22/2021  Penne Lash, RMA Care Guide, Embedded Care Coordination [facility]  [place], [place] [address] Direct Dial: [phone] [email or web address]

## 2020-12-23 ENCOUNTER — Telehealth: Payer: Self-pay

## 2020-12-23 NOTE — Telephone Encounter (Signed)
Copied from CRM [id number]. Topic: General - Other >> Dec 23, 2020 12:14 PM Dalphine Handing A wrote: [facility] is requesting that patiens plan of care forms be faxed over to [phone]

## 2020-12-25 NOTE — Telephone Encounter (Signed)
Called [facility]. Forms are being resent.  

## 2020-12-27 ENCOUNTER — Ambulatory Visit (INDEPENDENT_AMBULATORY_CARE_PROVIDER_SITE_OTHER): Payer: Medicare HMO | Admitting: Family Medicine

## 2020-12-27 ENCOUNTER — Encounter: Payer: Self-pay | Admitting: Family Medicine

## 2020-12-27 ENCOUNTER — Other Ambulatory Visit: Payer: Self-pay

## 2020-12-27 VITALS — BP 132/76 | HR 73 | Temp 98.6°F | Wt 184.8 lb

## 2020-12-27 DIAGNOSIS — Z23 Encounter for immunization: Secondary | ICD-10-CM | POA: Diagnosis not present

## 2020-12-27 DIAGNOSIS — E1165 Type 2 diabetes mellitus with hyperglycemia: Secondary | ICD-10-CM | POA: Diagnosis not present

## 2020-12-27 DIAGNOSIS — S41101A Unspecified open wound of right upper arm, initial encounter: Secondary | ICD-10-CM

## 2020-12-27 DIAGNOSIS — E782 Mixed hyperlipidemia: Secondary | ICD-10-CM

## 2020-12-27 LAB — BAYER DCA HB A1C WAIVED: HB A1C (BAYER DCA - WAIVED): 7.9 % — ABNORMAL HIGH (ref <7.0)

## 2020-12-27 MED ORDER — LOVASTATIN 20 MG PO TABS: ORAL_TABLET | ORAL | 1 refills | Status: DC

## 2020-12-27 MED ORDER — EMPAGLIFLOZIN 25 MG PO TABS: 25.0000 mg | ORAL_TABLET | Freq: Every day | ORAL | 1 refills | Status: DC

## 2020-12-27 MED ORDER — LISINOPRIL 40 MG PO TABS: 40.0000 mg | ORAL_TABLET | Freq: Every day | ORAL | 1 refills | Status: DC

## 2020-12-27 NOTE — Progress Notes (Signed)
BP 132/76   Pulse 73   Temp 98.6 F (37 C)   Wt 184 lb 12.8 oz (83.8 kg)   SpO2 97%   BMI 26.52 kg/m    Subjective:    Patient ID: Lucas Weiss, male    DOB: 08-19-1956, 65 y.o.   MRN: [id number]  HPI: JAVI BOLLMAN is a 65 y.o. male  Chief Complaint  Patient presents with  . Diabetes   DIABETES Hypoglycemic episodes:no Polydipsia/polyuria: yes Visual disturbance: no Chest pain: no Paresthesias: no Glucose Monitoring: no  Accucheck frequency: Not Checking Taking Insulin?: no Blood Pressure Monitoring: not checking Retinal Examination: Not up to Date Foot Exam: Up to Date Diabetic Education: Completed Pneumovax: Up to Date Influenza: Up to Date Aspirin: no   Relevant past medical, surgical, family and social history reviewed and updated as indicated. Interim medical history since our last visit reviewed. Allergies and medications reviewed and updated.  Review of Systems  Constitutional: Negative.   Respiratory: Negative.   Cardiovascular: Negative.   Gastrointestinal: Negative.   Musculoskeletal: Negative.   Psychiatric/Behavioral: Negative.     Per HPI unless specifically indicated above     Objective:    BP 132/76   Pulse 73   Temp 98.6 F (37 C)   Wt 184 lb 12.8 oz (83.8 kg)   SpO2 97%   BMI 26.52 kg/m   Wt Readings from Last 3 Encounters:  12/27/20 184 lb 12.8 oz (83.8 kg)  11/05/20 180 lb (81.6 kg)  10/22/20 176 lb 3.2 oz (79.9 kg)    Physical Exam Vitals and nursing note reviewed.  Constitutional:      General: He is not in acute distress.    Appearance: Normal appearance. He is not ill-appearing, toxic-appearing or diaphoretic.  HENT:     Head: Normocephalic and atraumatic.     Right Ear: External ear normal.     Left Ear: External ear normal.     Nose: Nose normal.     Mouth/Throat:     Mouth: Mucous membranes are moist.     Pharynx: Oropharynx is clear.  Eyes:     General: No scleral icterus.       Right eye: No discharge.         Left eye: No discharge.     Extraocular Movements: Extraocular movements intact.     Conjunctiva/sclera: Conjunctivae normal.     Pupils: Pupils are equal, round, and reactive to light.  Cardiovascular:     Rate and Rhythm: Normal rate and regular rhythm.     Pulses: Normal pulses.     Heart sounds: Normal heart sounds. No murmur heard. No friction rub. No gallop.   Pulmonary:     Effort: Pulmonary effort is normal. No respiratory distress.     Breath sounds: Normal breath sounds. No stridor. No wheezing, rhonchi or rales.  Chest:     Chest wall: No tenderness.  Musculoskeletal:        General: Normal range of motion.     Cervical back: Normal range of motion and neck supple.  Skin:    General: Skin is warm and dry.     Capillary Refill: Capillary refill takes less than 2 seconds.     Coloration: Skin is not jaundiced or pale.     Findings: No bruising, erythema, lesion or rash.     Comments: Small, well healing wound on R arm consistent with bug bite  Neurological:     General: No focal deficit present.  Mental Status: He is alert and oriented to person, place, and time. Mental status is at baseline.  Psychiatric:        Mood and Affect: Mood normal.        Behavior: Behavior normal.        Thought Content: Thought content normal.        Judgment: Judgment normal.     Results for orders placed or performed in visit on 10/08/20  CBC with Differential/Platelet  Result Value Ref Range   WBC 7.1 3.4 - 10.8 x10E3/uL   RBC 4.26 4.14 - 5.80 x10E6/uL   Hemoglobin 11.7 (L) 13.0 - 17.7 g/dL   Hematocrit 36.7 (L) 37.5 - 51.0 %   MCV 86 79 - 97 fL   MCH 27.5 26.6 - 33.0 pg   MCHC 31.9 31.5 - 35.7 g/dL   RDW 13.9 11.6 - 15.4 %   Platelets 327 150 - 450 x10E3/uL   Neutrophils 59 Not Estab. %   Lymphs 27 Not Estab. %   Monocytes 8 Not Estab. %   Eos 5 Not Estab. %   Basos 1 Not Estab. %   Neutrophils Absolute 4.1 1.4 - 7.0 x10E3/uL   Lymphocytes Absolute 1.9 0.7 - 3.1  x10E3/uL   Monocytes Absolute 0.6 0.1 - 0.9 x10E3/uL   EOS (ABSOLUTE) 0.4 0.0 - 0.4 x10E3/uL   Basophils Absolute 0.1 0.0 - 0.2 x10E3/uL   Immature Granulocytes 0 Not Estab. %   Immature Grans (Abs) 0.0 0.0 - 0.1 x10E3/uL  Bayer DCA Hb A1c Waived  Result Value Ref Range   HB A1C (BAYER DCA - WAIVED) 7.2 (H) <7.0 %  Microalbumin, Urine Waived  Result Value Ref Range   Microalb, Ur Waived 150 (H) 0 - 19 mg/L   Creatinine, Urine Waived 100 10 - 300 mg/dL   Microalb/Creat Ratio >300 (H) <30 mg/g  Basic metabolic panel  Result Value Ref Range   Glucose 110 (H) 65 - 99 mg/dL   BUN 19 8 - 27 mg/dL   Creatinine, Ser 1.12 0.76 - 1.27 mg/dL   GFR calc non Af Amer 70 >59 mL/min/1.73   GFR calc Af Amer 81 >59 mL/min/1.73   BUN/Creatinine Ratio 17 10 - 24   Sodium 139 134 - 144 mmol/L   Potassium 4.6 3.5 - 5.2 mmol/L   Chloride 104 96 - 106 mmol/L   CO2 20 20 - 29 mmol/L   Calcium 9.0 8.6 - 10.2 mg/dL  TSH  Result Value Ref Range   TSH 1.440 0.450 - 4.500 uIU/mL  Lipid Panel w/o Chol/HDL Ratio  Result Value Ref Range   Cholesterol, Total 179 100 - 199 mg/dL   Triglycerides 269 (H) 0 - 149 mg/dL   HDL 38 (L) >39 mg/dL   VLDL Cholesterol Cal 45 (H) 5 - 40 mg/dL   LDL Chol Calc (NIH) 96 0 - 99 mg/dL      Assessment & Plan:   Problem List Items Addressed This Visit      Endocrine   Controlled type 2 diabetes mellitus ([place]) - Primary    Not under good control with A1c of 7.9. Will restart his jardiance and consult CCM for help with coverage. Recheck 3 months. Call with any concerns.       Relevant Medications   lovastatin (MEVACOR) 20 MG tablet   lisinopril (ZESTRIL) 40 MG tablet   empagliflozin (JARDIANCE) 25 MG TABS tablet   Other Relevant Orders   Bayer DCA Hb A1c Waived  AMB Referral to [facility] Coordinaton    Other Visit Diagnoses    Open wound of right upper arm, initial encounter       Healing well. Due for Td. Given today.    Relevant Orders   Td :  Tetanus/diphtheria >7yo Preservative  free   Mixed hyperlipidemia       Relevant Medications   lovastatin (MEVACOR) 20 MG tablet   lisinopril (ZESTRIL) 40 MG tablet       Follow up plan: Return in about 3 months (around 03/27/2021).

## 2020-12-27 NOTE — Assessment & Plan Note (Signed)
Not under good control with A1c of 7.9. Will restart his jardiance and consult CCM for help with coverage. Recheck 3 months. Call with any concerns.

## 2020-12-31 ENCOUNTER — Other Ambulatory Visit: Payer: Self-pay | Admitting: Family Medicine

## 2020-12-31 ENCOUNTER — Telehealth: Payer: Self-pay

## 2020-12-31 DIAGNOSIS — E782 Mixed hyperlipidemia: Secondary | ICD-10-CM

## 2020-12-31 NOTE — Chronic Care Management (AMB) (Signed)
  Chronic Care Management   Note  12/31/2020 Name: AUDRIC VENN MRN: [id number] DOB: [birth year]  CAVIN LONGMAN is a 65 y.o. year old male who is a primary care patient of Dorcas Carrow, DO. ALVIE FOWLES is currently enrolled in care management services. An additional referral for Pharm D  was placed.  Follow up plan: Telephone appointment with care management team member scheduled for:01/08/2021  Penne Lash, RMA Care Guide, Embedded Care Coordination [facility]  [place], [place] [address] Direct Dial: [phone] [email or web address]

## 2021-01-08 ENCOUNTER — Telehealth: Payer: Medicare HMO

## 2021-01-08 ENCOUNTER — Telehealth: Payer: Self-pay | Admitting: Pharmacist

## 2021-01-08 NOTE — Chronic Care Management (AMB) (Deleted)
Chronic Care Management Pharmacy  Name: Lucas Weiss  MRN: [id number] DOB: 09-12-1956   Chief Complaint/ HPI  Lucas Weiss,  65 y.o. , male presents for his Initial CCM visit with the clinical pharmacist via telephone.  PCP : [name], DO Patient Care Team: [name], DO as PCP - General (Family Medicine) [name], MD as Consulting Physician (Orthopedic Surgery) Vanita Ingles, RN as Case Manager ([place]) Vladimir Faster, [facility] (Pharmacist)  Patient's chronic conditions include: Hypertension, Diabetes, Heart Failure, Coronary Artery Disease, Depression and chronic pain   Office Visits: 12/28/19- Dr. Wynetta Emery-  01/15/20- [facility]- PAP for Jardiance mailed to patient  Consult Visit: 09/26/20- [facility] fall with multiple rib fractures. hematoma   Subjective: ***  Objective: No Known Allergies  Medications: Outpatient Encounter Medications as of 01/08/2021  Medication Sig  . amLODipine (NORVASC) 5 MG tablet Take 1 tablet (5 mg total) by mouth daily.  Marland Kitchen aspirin EC 81 MG tablet Take 81 mg by mouth daily.  . Blood Glucose Monitoring Suppl (ONETOUCH VERIO) w/Device KIT To check blood sugar twice a day and document, bring to provider visits. (Patient not taking: Reported on 12/27/2020)  . empagliflozin (JARDIANCE) 25 MG TABS tablet Take 1 tablet (25 mg total) by mouth daily before breakfast.  . glucose blood (ONETOUCH VERIO) test strip To check blood sugar twice a day and document, bring to provider visits.  . Lancets (ONETOUCH ULTRASOFT) lancets To check blood sugar twice a day and document, bring to provider visits.  Marland Kitchen lisinopril (ZESTRIL) 40 MG tablet Take 1 tablet (40 mg total) by mouth daily.  Marland Kitchen lovastatin (MEVACOR) 20 MG tablet TAKE TWO TABLETS BY MOUTH EVERY NIGHT AT BEDTIME.  . metFORMIN (GLUCOPHAGE) 500 MG tablet TAKE TWO TABLETS BY MOUTH TWICE A DAY WITH FOOD  . metoprolol tartrate (LOPRESSOR) 50 MG tablet Take 1 tablet (50 mg total) by mouth  daily.  . nitroGLYCERIN (NITROSTAT) 0.4 MG SL tablet Place 1 tablet (0.4 mg total) under the tongue every 5 (five) minutes as needed for chest pain.  Marland Kitchen QUEtiapine (SEROQUEL) 25 MG tablet Take 1 tablet (25 mg total) by mouth at bedtime. (Patient not taking: Reported on 12/27/2020)   No facility-administered encounter medications on file as of 01/08/2021.    Wt Readings from Last 3 Encounters:  12/27/20 184 lb 12.8 oz (83.8 kg)  11/05/20 180 lb (81.6 kg)  10/22/20 176 lb 3.2 oz (79.9 kg)    Lab Results  Component Value Date   CREATININE 1.12 10/08/2020   BUN 19 10/08/2020   GFRNONAA 70 10/08/2020   GFRAA 81 10/08/2020   NA 139 10/08/2020   K 4.6 10/08/2020   CALCIUM 9.0 10/08/2020   CO2 20 10/08/2020     Current Diagnosis/Assessment:    Goals Addressed   None     Diabetes   A1c goal {A1c goals:23924}  Recent Relevant Labs: Lab Results  Component Value Date/Time   HGBA1C 7.9 (H) 12/27/2020 02:10 PM   HGBA1C 7.2 (H) 10/08/2020 03:01 PM   MICROALBUR 150 (H) 10/08/2020 03:01 PM   MICROALBUR 150 (H) 09/04/2019 01:38 PM    Last diabetic Eye exam: No results found for: HMDIABEYEEXA  Last diabetic Foot exam: No results found for: HMDIABFOOTEX   Checking BG: {CHL HP Blood Glucose Monitoring Frequency:[phone]}  Recent FBG Readings: *** Recent pre-meal BG readings: *** Recent 2hr PP BG readings:  *** Recent HS BG readings: ***  Patient has failed these meds in past: ***  Patient is currently {CHL Controlled/Uncontrolled:[phone]} on the following medications: Marland Kitchen Metformin 1000 mg bid . Jardiance 25 mg qd  We discussed: {CHL HP Upstream Pharmacy [phone]}  Plan  Continue {CHL HP Upstream Pharmacy MEQAS:3419622297}  Hyperlipidemia/ CAD/ S/P CABG x 5 '09, LIMA to LAD, SVG x 3   LDL goal < 70  Last lipids Lab Results  Component Value Date   CHOL 179 10/08/2020   HDL 38 (L) 10/08/2020   LDLCALC 96 10/08/2020   TRIG 269 (H) 10/08/2020   CHOLHDL  6.3 07/17/2011   Hepatic Function Latest Ref Rng & Units 09/04/2019 02/14/2019 02/10/2018  Total Protein 6.0 - 8.5 g/dL 6.7 7.1 7.1  Albumin 3.8 - 4.8 g/dL 4.2 4.6 4.4  AST 0 - 40 IU/L 36 23 21  ALT 0 - 44 IU/L 40 29 29  Alk Phosphatase 39 - 117 IU/L 78 81 69  Total Bilirubin 0.0 - 1.2 mg/dL 0.3 0.3 <0.2     The 10-year ASCVD risk score Mikey Bussing DC Jr., et al., 2013) is: 24.5%   Values used to calculate the score:     Age: 55 years     Sex: Male     Is Non-Hispanic African American: No     Diabetic: Yes     Tobacco smoker: No     Systolic Blood Pressure: 989 mmHg     Is BP treated: Yes     HDL Cholesterol: 38 mg/dL     Total Cholesterol: 179 mg/dL uvas  Patient has failed these meds in past: ?atorvastatin. Rosuvastatin?? Patient is currently {CHL Controlled/Uncontrolled:[phone]} on the following medications:  . Lovastatin 20 mg qhs . Aspirin 81 mg qd  . NTG 0.4 mg sl prn  We discussed:  {CHL HP Upstream Pharmacy [phone]}  Plan  Recommend changing to high intensity statin rosuvastatin 50m.   Hypertension/ H/o  CVA   BP goal is:  {CHL HP UPSTREAM Pharmacist BP [phone]}  Office blood pressures are  BP Readings from Last 3 Encounters:  12/27/20 132/76  11/05/20 125/72  10/22/20 121/78   BMP Latest Ref Rng & Units 10/08/2020 02/29/2020 09/04/2019  Glucose 65 - 99 mg/dL 110(H) 204(H) 209(H)  BUN 8 - 27 mg/dL '19 21 16  ' Creatinine 0.76 - 1.27 mg/dL 1.12 1.07 1.20  BUN/Creat Ratio 10 - '24 17 20 13  ' Sodium 134 - 144 mmol/L 139 137 140  Potassium 3.5 - 5.2 mmol/L 4.6 4.5 4.8  Chloride 96 - 106 mmol/L 104 103 103  CO2 20 - 29 mmol/L 20 19(L) 21  Calcium 8.6 - 10.2 mg/dL 9.0 9.4 9.3    Patient checks BP at home {CHL HP BP Monitoring Frequency:[phone]} Patient home BP readings are ranging: ***  Patient has failed these meds in the past: *** Patient is currently {CHL Controlled/Uncontrolled:[phone]} on the following medications:  . Amlodipine 5  mg qd . Lisinopril 40 mg qd . Lopressor 50 mg qd?  We discussed {CHL HP Upstream Pharmacy [phone]}  Plan  Continue {CHL HP Upstream Pharmacy PQJJHE:[id number]}       Medication Management   Patient's [facility] pharmacy is:  [facility] [address]Phone: [phone]: [phone] Uses pill box? {Yes or If no, why not?:20788} Pt endorses ***% compliance  We discussed: {Pharmacy options:24294}  Plan  {US Pharmacy PINOM:76720}   Follow up: *** month phone visit  ***

## 2021-01-08 NOTE — Progress Notes (Addendum)
    Chronic Care Management Pharmacy Assistant   Name: Lucas Weiss  MRN: [id number] DOB: 1956/05/30  Reason for Encounter: Patient Assistance Application    PCP : [name], DO  Allergies:  No Known Allergies  Medications: Outpatient Encounter Medications as of 01/08/2021  Medication Sig   amLODipine (NORVASC) 5 MG tablet Take 1 tablet (5 mg total) by mouth daily.   aspirin EC 81 MG tablet Take 81 mg by mouth daily.   Blood Glucose Monitoring Suppl (ONETOUCH VERIO) w/Device KIT To check blood sugar twice a day and document, bring to provider visits. (Patient not taking: Reported on 12/27/2020)   empagliflozin (JARDIANCE) 25 MG TABS tablet Take 1 tablet (25 mg total) by mouth daily before breakfast.   glucose blood (ONETOUCH VERIO) test strip To check blood sugar twice a day and document, bring to provider visits.   Lancets (ONETOUCH ULTRASOFT) lancets To check blood sugar twice a day and document, bring to provider visits.   lisinopril (ZESTRIL) 40 MG tablet Take 1 tablet (40 mg total) by mouth daily.   lovastatin (MEVACOR) 20 MG tablet TAKE TWO TABLETS BY MOUTH EVERY NIGHT AT BEDTIME.   metFORMIN (GLUCOPHAGE) 500 MG tablet TAKE TWO TABLETS BY MOUTH TWICE A DAY WITH FOOD   metoprolol tartrate (LOPRESSOR) 50 MG tablet Take 1 tablet (50 mg total) by mouth daily.   nitroGLYCERIN (NITROSTAT) 0.4 MG SL tablet Place 1 tablet (0.4 mg total) under the tongue every 5 (five) minutes as needed for chest pain.   QUEtiapine (SEROQUEL) 25 MG tablet Take 1 tablet (25 mg total) by mouth at bedtime. (Patient not taking: Reported on 12/27/2020)   No facility-administered encounter medications on file as of 01/08/2021.    Current Diagnosis: Patient Active Problem List   Diagnosis Date Noted   Multiple rib fractures 10/08/2020   Insomnia 05/06/2017   History of left hip replacement 05/19/2016   Controlled type 2 diabetes mellitus ([place]) 01/09/2016   Tobacco abuse, in remission 01/09/2016    Severe recurrent major depression ([place]) 06/14/2015   Chronic pain 06/14/2015   Alcohol abuse, in remission    Atherosclerotic PVD with intermittent claudication ([place]) 10/25/2014   Hypertension associated with diabetes ([place]) 09/17/2014   Renal artery stenosis, native, bilateral ([place]) 02/20/2013   CAD (coronary artery disease) 09/16/2012   PAD (peripheral artery disease): Left common iliac stent plus additional stent(09/15/12) for ISRS. 09/16/2012   S/P CABG x 5: 2009: LIMA to LAD, SVG to Diag, Seq SVG to OM2-3, SVG to PDA 09/16/2012   Hypertensive heart failure ([place]) 09/16/2012   Hyperlipidemia associated with type 2 diabetes mellitus ([place]) 09/16/2012    Goals Addressed   None    01-08-21: PharmD referral was placed by PCP related to the patient's diabetes. The patient did express issues related to diabetic medication cost. Prefilled patient assistance application for Jardiance was completed. PharmD Birdena Crandall made aware and will print our for patient completion and MD signature.   Cloretta Ned, LPN Clinical Pharmacist Assistant  [phone]   Follow-Up:  Patient Assistance Coordination  I have reviewed the care management and care coordination activities outlined in this encounter and I am certifying that I agree with the content of this note.  Unable to reach patient for scheduled outreach yesterday. Will reschedule.  Junita Push. Kenton Kingfisher PharmD, [facility] [phone]

## 2021-01-22 ENCOUNTER — Ambulatory Visit (INDEPENDENT_AMBULATORY_CARE_PROVIDER_SITE_OTHER): Payer: Medicare HMO | Admitting: General Practice

## 2021-01-22 ENCOUNTER — Telehealth: Payer: Medicare Other | Admitting: General Practice

## 2021-01-22 DIAGNOSIS — I639 Cerebral infarction, unspecified: Secondary | ICD-10-CM | POA: Diagnosis not present

## 2021-01-22 DIAGNOSIS — I25119 Atherosclerotic heart disease of native coronary artery with unspecified angina pectoris: Secondary | ICD-10-CM | POA: Diagnosis not present

## 2021-01-22 DIAGNOSIS — E782 Mixed hyperlipidemia: Secondary | ICD-10-CM

## 2021-01-22 DIAGNOSIS — E1165 Type 2 diabetes mellitus with hyperglycemia: Secondary | ICD-10-CM

## 2021-01-22 DIAGNOSIS — E1159 Type 2 diabetes mellitus with other circulatory complications: Secondary | ICD-10-CM | POA: Diagnosis not present

## 2021-01-22 DIAGNOSIS — I152 Hypertension secondary to endocrine disorders: Secondary | ICD-10-CM | POA: Diagnosis not present

## 2021-01-22 NOTE — Chronic Care Management (AMB) (Signed)
Chronic Care Management   CCM RN Visit Note  01/22/2021 Name: Lucas Weiss MRN: [id number] DOB: Mar 21, 1956  Subjective: Lucas Weiss is a 65 y.o. year old male who is a primary care patient of [name], DO. The care management team was consulted for assistance with disease management and care coordination needs.    Engaged with patient by telephone for follow up visit in response to provider referral for case management and/or care coordination services.   Consent to Services:  The patient was given information about Chronic Care Management services, agreed to services, and gave verbal consent prior to initiation of services.  Please see initial visit note for detailed documentation.   Patient agreed to services and verbal consent obtained.   Assessment: Review of patient past medical history, allergies, medications, health status, including review of consultants reports, laboratory and other test data, was performed as part of comprehensive evaluation and provision of chronic care management services.   SDOH (Social Determinants of Health) assessments and interventions performed:    CCM Care Plan  No Known Allergies  Outpatient Encounter Medications as of 01/22/2021  Medication Sig   amLODipine (NORVASC) 5 MG tablet Take 1 tablet (5 mg total) by mouth daily.   aspirin EC 81 MG tablet Take 81 mg by mouth daily.   Blood Glucose Monitoring Suppl (ONETOUCH VERIO) w/Device KIT To check blood sugar twice a day and document, bring to provider visits. (Patient not taking: Reported on 12/27/2020)   empagliflozin (JARDIANCE) 25 MG TABS tablet Take 1 tablet (25 mg total) by mouth daily before breakfast.   glucose blood (ONETOUCH VERIO) test strip To check blood sugar twice a day and document, bring to provider visits.   Lancets (ONETOUCH ULTRASOFT) lancets To check blood sugar twice a day and document, bring to provider visits.   lisinopril (ZESTRIL) 40 MG tablet Take 1 tablet (40  mg total) by mouth daily.   lovastatin (MEVACOR) 20 MG tablet TAKE TWO TABLETS BY MOUTH EVERY NIGHT AT BEDTIME.   metFORMIN (GLUCOPHAGE) 500 MG tablet TAKE TWO TABLETS BY MOUTH TWICE A DAY WITH FOOD   metoprolol tartrate (LOPRESSOR) 50 MG tablet Take 1 tablet (50 mg total) by mouth daily.   nitroGLYCERIN (NITROSTAT) 0.4 MG SL tablet Place 1 tablet (0.4 mg total) under the tongue every 5 (five) minutes as needed for chest pain.   QUEtiapine (SEROQUEL) 25 MG tablet Take 1 tablet (25 mg total) by mouth at bedtime. (Patient not taking: Reported on 12/27/2020)   No facility-administered encounter medications on file as of 01/22/2021.    Patient Active Problem List   Diagnosis Date Noted   Multiple rib fractures 10/08/2020   Insomnia 05/06/2017   History of left hip replacement 05/19/2016   Controlled type 2 diabetes mellitus ([place]) 01/09/2016   Tobacco abuse, in remission 01/09/2016   Severe recurrent major depression ([place]) 06/14/2015   Chronic pain 06/14/2015   Alcohol abuse, in remission    Atherosclerotic PVD with intermittent claudication ([place]) 10/25/2014   Hypertension associated with diabetes ([place]) 09/17/2014   Renal artery stenosis, native, bilateral ([place]) 02/20/2013   CAD (coronary artery disease) 09/16/2012   PAD (peripheral artery disease): Left common iliac stent plus additional stent(09/15/12) for ISRS. 09/16/2012   S/P CABG x 5: 2009: LIMA to LAD, SVG to Diag, Seq SVG to OM2-3, SVG to PDA 09/16/2012   Hypertensive heart failure ([place]) 09/16/2012   Hyperlipidemia associated with type 2 diabetes mellitus ([place]) 09/16/2012    Conditions to be  addressed/monitored:CAD, HTN, HLD, DMII and history of stroke   Care Plan : RNCMN: Diabetes Type 2 (Adult)  Updates made by Vanita Ingles since 01/22/2021 12:00 AM    Problem: RNCM: Glycemic Management (Diabetes, Type 2)   Priority: High    Goal: RNCM: Glycemic Management Optimized   Priority: High  Note:   .Objective:   Lab Results  Component Value Date   HGBA1C 7.9 (H) 12/27/2020    Lab Results  Component Value Date   CREATININE 1.12 10/08/2020   CREATININE 1.07 02/29/2020   CREATININE 1.20 09/04/2019     No results found for: EGFR Current Barriers:   Knowledge Deficits related to basic Diabetes pathophysiology and self care/management  Knowledge Deficits related to medications used for management of diabetes  Difficulty obtaining or cannot afford medications  Does not have glucometer to monitor blood sugar  Financial Constraints  Cognitive Deficits  Limited Social Support  Unable to independently manage DM  Unable to self administer medications as prescribed  Does not adhere to provider recommendations re: checking blood sugars and medications as prescribed   Does not adhere to prescribed medication regimen  Lacks social connections  Does not maintain contact with provider office  Does not contact provider office for questions/concerns  Case Manager Clinical Goal(s):   patient will demonstrate improved adherence to prescribed treatment plan for diabetes self care/management as evidenced by: daily monitoring and recording of CBG  adherence to ADA/ carb modified diet exercise 3/4 days/week adherence to prescribed medication regimen Interventions:   Collaboration with [name], DO regarding development and update of comprehensive plan of care as evidenced by provider attestation and co-signature  Inter-disciplinary care team collaboration (see longitudinal plan of care)  Provided education to patient about basic DM disease process  Reviewed medications with patient and discussed importance of medication adherence  Discussed plans with patient for ongoing care management follow up and provided patient with direct contact information for care management team  Provided patient with written educational materials related to hypo and hyperglycemia and importance of  correct treatment  Reviewed scheduled/upcoming provider appointments including: 03-27-2021  Advised patient, providing education and rationale, to check cbg daily  and record, calling pcp for findings outside established parameters.    Referral made to pharmacy team for assistance with cost constraints for Jardiance and help with getting a glucose meter  Review of patient status, including review of consultants reports, relevant laboratory and other test results, and medications completed. Patient Goals/Self-Care Activities  Over the next 120 days, patient will:  - UNABLE to independently manage DM Self administers oral medications as prescribed Attends all scheduled provider appointments Checks blood sugars as prescribed and utilize hyper and hypoglycemia protocol as needed Adheres to prescribed ADA/carb modified- currently is not following a restricted diet. Discussed referral for care guides but the patient refuses at this time. Encouraged compliance  - barriers to adherence to treatment plan identified - blood glucose monitoring encouraged - resources required to improve adherence to care identified - self-awareness of signs/symptoms of hypo or hyperglycemia encouraged  Follow Up Plan: Telephone follow up appointment with care management team member scheduled for:  04-02-2021 at 0900 am   Task: RNCM: Alleviate Barriers to Glycemic Management   Note:   Care Management Activities:    - barriers to adherence to treatment plan identified - blood glucose monitoring encouraged - resources required to improve adherence to care identified - self-awareness of signs/symptoms of hypo or hyperglycemia encouraged    Notes:  the patient currently does not have a glucose meter. Working the [employer] for assistance with meter.    Care Plan : RNCM: Hypertension (Adult)  Updates made by Vanita Ingles since 01/22/2021 12:00 AM    Problem: RNCM: Hypertension (Hypertension)   Priority: Medium     Goal: RNCM: Hypertension Monitored   Priority: Medium  Note:   Objective:   Last practice recorded BP readings:  BP Readings from Last 3 Encounters:  12/27/20 132/76  11/05/20 125/72  10/22/20 121/78     Most recent eGFR/CrCl: No results found for: EGFR  No components found for: CRCL Current Barriers:   Knowledge Deficits related to basic understanding of hypertension pathophysiology and self care management  Knowledge Deficits related to understanding of medications prescribed for management of hypertension  Difficulty obtaining medications  Non-adherence to prescribed medication regimen  Cognitive Deficits  Unable to independently manage HTN  Lacks social connections  Does not contact provider office for questions/concerns Case Manager Clinical Goal(s):   Over the next 120 days, patient will verbalize understanding of plan for hypertension management  Over the next 120 days, patient will attend all scheduled medical appointments: 03-27-2021  Over the next 120 days, patient will demonstrate improved adherence to prescribed treatment plan for hypertension as evidenced by taking all medications as prescribed, monitoring and recording blood pressure as directed, adhering to low sodium/DASH diet  Over the next 120 days, patient will demonstrate improved health management independence as evidenced by checking blood pressure as directed and notifying PCP if SBP>160 or DBP > 90, taking all medications as prescribe, and adhering to a low sodium diet as discussed.  Over the next 120 days, patient will verbalize basic understanding of hypertension disease process and self health management plan as evidenced by compliance with heart healthy diet, compliance with medications, working with the CCM team to manage health and well being.  Interventions:   Collaboration with [name], DO regarding development and update of comprehensive plan of care as evidenced by provider  attestation and co-signature  Inter-disciplinary care team collaboration (see longitudinal plan of care)  Evaluation of current treatment plan related to hypertension self management and patient's adherence to plan as established by provider.  Provided education to patient re: stroke prevention, s/s of heart attack and stroke, DASH diet, complications of uncontrolled blood pressure  Reviewed medications with patient and discussed importance of compliance  Discussed plans with patient for ongoing care management follow up and provided patient with direct contact information for care management team  Advised patient, providing education and rationale, to monitor blood pressure daily and record, calling PCP for findings outside established parameters.   Reviewed scheduled/upcoming provider appointments including: 03-27-2021 Patient Goals/Self-Care Activities  Over the next 120 days, patient will:  - Self administers medications as prescribed Attends all scheduled provider appointments Calls provider office for new concerns, questions, or BP outside discussed parameters Checks BP and records as discussed Follows a low sodium diet/DASH diet - depression screen reviewed - home or ambulatory blood pressure monitoring encouraged Follow Up Plan: Telephone follow up appointment with care management team member scheduled for: 04-02-2021 at 0900 am   Task: RNCM: Identify and Monitor Blood Pressure Elevation   Note:   Care Management Activities:    - depression screen reviewed - home or ambulatory blood pressure monitoring encouraged       Care Plan : RNCM: Coronary Artery Disease (Adult) and HLD  Updates made by Vanita Ingles since 01/22/2021 12:00 AM  Problem: RNCM: Disease Progression (Coronary Artery Disease) and HLD   Priority: Medium    Goal: RNCM: Disease Progression Prevented or Minimizeda; CAD and HLD   Priority: Medium  Note:   Current Barriers:   Poorly controlled  hyperlipidemia, complicated by non-compliance with dietary restrictions, cost constraints, chronic conditions   Current antihyperlipidemic regimen: Lovastatin 20 mg QD  Most recent lipid panel:     Component Value Date/Time   CHOL 179 10/08/2020 1505   TRIG 269 (H) 10/08/2020 1505   HDL 38 (L) 10/08/2020 1505   CHOLHDL 6.3 07/17/2011 0715   VLDL 48 (H) 07/17/2011 0715   LDLCALC 96 10/08/2020 1505     ASCVD risk enhancing conditions: age 14, DM, HTN, past history of stroke, former smoker  Unable to self administer medications as prescribed  Does not adhere to prescribed medication regimen  Lacks social connections  Does not contact provider office for questions/concerns  RN Care Manager Clinical Goal(s):   Over the next 120 days, patient will work with Consulting civil engineer, providers, and care team towards execution of optimized self-health management plan  Over the next 120 days, patient will verbalize understanding of plan for effective management of HLD and CAD  Over the next 120 days, patient will work with [facility], CCM team and pcp to address needs related to HLD and CAD  Over the next 120 days, patient will attend all scheduled medical appointments: 03-27-2021  Interventions:  Collaboration with [name], DO regarding development and update of comprehensive plan of care as evidenced by provider attestation and co-signature  Inter-disciplinary care team collaboration (see longitudinal plan of care)  Medication review performed; medication list updated in electronic medical record.   Inter-disciplinary care team collaboration (see longitudinal plan of care)  Referred to pharmacy team for assistance with HLD medication management  Evaluation of current treatment plan related to HLD and CAD and patient's adherence to plan as established by provider.  Advised patient to call the office for changes in condition or question   Provided education to patient re: benefits  of heart health diet and following recommendations of the provider   Reviewed scheduled/upcoming provider appointments including: 03-27-2021  Discussed plans with patient for ongoing care management follow up and provided patient with direct contact information for care management team Patient Goals/Self-Care Activities:  Over the next 120 days, patient will:   - call for medicine refill 2 or 3 days before it runs out - call if I am sick and can't take my medicine - keep a list of all the medicines I take; vitamins and herbals too - learn to read medicine labels - use a pillbox to sort medicine - use an alarm clock or phone to remind me to take my medicine - change to whole grain breads, cereal, pasta - drink 6 to 8 glasses of water each day - eat 3 to 5 servings of fruits and vegetables each day - eat 5 or 6 small meals each day - fill half the plate with nonstarchy vegetables - keep a food diary - limit fast food meals to no more than 1 per week - manage portion size - prepare main meal at home 3 to 5 days each week - read food labels for fat, fiber, carbohydrates and portion size - be open to making changes - I can manage, know and watch for signs of a heart attack - if I have chest pain, call for help - learn about small changes that will make  a big difference - learn my personal risk factors  - barriers to treatment adherence reviewed and addressed - difficulty of making life-long changes acknowledged - functional limitation screening reviewed - healthy lifestyle promoted - individualized medical nutrition therapy provided - medication-adherence assessment completed - response to pharmacologic therapy monitored - self-awareness of signs/symptoms of worsening disease encouraged  Follow Up Plan: Telephone follow up appointment with care management team member scheduled for: 04-02-2021 at 0900 am     Task: RNCM: Alleviate Barriers to Coronary Artery Disease Therapy   Note:    Care Management Activities:    - barriers to treatment adherence reviewed and addressed - difficulty of making life-long changes acknowledged - functional limitation screening reviewed - healthy lifestyle promoted - individualized medical nutrition therapy provided - medication-adherence assessment completed - response to pharmacologic therapy monitored - self-awareness of signs/symptoms of worsening disease encouraged       Care Plan : RNCM: Stroke (Adult)  Updates made by Vanita Ingles since 01/22/2021 12:00 AM    Problem: RNCM: Emotional Adjustment to Disease (Stroke)   Priority: Medium    Long-Range Goal: RNCM: Optimal Coping   Priority: Medium  Note:   Current Barriers:   Knowledge Deficits related to resources to help with expressed needs for patient with chronic conditions and post residual stroke management   Chronic Disease Management support and education needs related to past history of stroke   Film/video editor.   Cognitive Deficits  Unable to self administer medications as prescribed  Does not adhere to provider recommendations re: dietary restrictions   Does not adhere to prescribed medication regimen  Lacks social connections  Does not contact provider office for questions/concerns  Nurse Case Manager Clinical Goal(s):   Over the next 120 days, patient will verbalize understanding of plan for effective management of post residual stroke   Over the next 120 days, patient will work with [facility], CCM team, and pcp to address needs related to meeting patients needs due to cognitive concerns and changes in condition   Over the next 120 days, patient will attend all scheduled medical appointments: 03-27-2021  Interventions:   1:1 collaboration with [name], DO regarding development and update of comprehensive plan of care as evidenced by provider attestation and co-signature  Inter-disciplinary care team collaboration (see longitudinal plan of  care)  Evaluation of current treatment plan related to post residual stroke management  and patient's adherence to plan as established by provider.  Advised patient to call the office for changes in condition or new questions   Provided education to patient re: following plan of care, adherence to a heart healthy/ADA diet, safety, and compliance with medications  Discussed plans with patient for ongoing care management follow up and provided patient with direct contact information for care management team  Patient Goals/Self-Care Activities Over the next 120 days, patient will:  - Patient will self administer medications as prescribed Patient will attend all scheduled provider appointments Patient will call pharmacy for medication refills Patient will attend church or other social activities Patient will continue to perform ADL's independently Patient will continue to perform IADL's independently Patient will call provider office for new concerns or questions Patient will work with BSW to address care coordination needs and will continue to work with the clinical team to address health care and disease management related needs.   - counseling provided - decision-making supported - depression screen reviewed - goal-setting facilitated - positive reinforcement provided - problem-solving facilitated - relaxation techniques promoted -  self-care encouraged - self-reflection promoted - verbalization of feelings encouraged  Follow Up Plan: Telephone follow up appointment with care management team member scheduled for: 04-02-2021 at 0900 am       Task: RNCM: Support Psychosocial Response to Stroke   Note:   Care Management Activities:    - counseling provided - decision-making supported - depression screen reviewed - goal-setting facilitated - positive reinforcement provided - problem-solving facilitated - relaxation techniques promoted - self-care encouraged - self-reflection  promoted - verbalization of feelings encouraged         Plan:Telephone follow up appointment with care management team member scheduled for:  04-02-2021 at 0900 am  [place], MSN, [facility] Mobile: [phone]

## 2021-01-22 NOTE — Patient Instructions (Signed)
Visit Information  PATIENT GOALS: Goals Addressed              This Visit's Progress   .  COMPLETED: RNCM- Pt: "I had a fall and broke my ribs" (pt-stated)        CARE PLAN ENTRY (see longitudinal plan of care for additional care plan information)  Current Barriers: Closing this care plan and opening in new ELS . Knowledge Deficits related to fall precautions in patient with  . Decreased adherence to prescribed treatment for fall prevention . Knowledge Deficits related to effective pain relief measures . Care Coordination needs related to pain and discomfort  in a patient with fall with rib fracture . Chronic Disease Management support and education needs related to pain management in patient with fall . Lacks caregiver support.  . Film/video editor.   Clinical Goal(s):  Marland Kitchen Over the next 120 days, patient will demonstrate improved adherence to prescribed treatment plan for decreasing falls as evidenced by patient reporting and review of EMR . Over the next 120 days, patient will verbalize using fall risk reduction strategies discussed . Over the next 120 days, patient will not experience additional falls . Over the next 120 days, patient will demonstrate a decrease in pain and fall exacerbations as evidenced by pain relief and no new falls  Interventions:  . Provided written and verbal education re: Potential causes of falls and Fall prevention strategies . Reviewed medications and discussed potential side effects of medications such as dizziness and frequent urination . Assessed for s/s of orthostatic hypotension . Assessed for falls since last encounter. . Assessed patients knowledge of fall risk prevention secondary to previously provided education. . Assessed working status of life alert bracelet and patient adherence . Provided patient information for fall alert systems . Provided education to patient re: pain relief measures and letting the provider know for worse pain  and discomfort.   Patient Self Care Activities:  . Utilize  (assistive device) appropriately with all ambulation- does not use assistive devices . De-clutter walkways . Change positions slowly . Wear secure fitting shoes at all times with ambulation . Utilize home lighting for dim lit areas . Have self and pet awareness at all times  Plan: . CCM RN CM will follow up in 30 to 60 days   Initial goal documentation     .  COMPLETED: RNCM-Pt: "I can't pay 130.00 for a meter and strips" (pt-stated)        CARE PLAN ENTRY (see longtitudinal plan of care for additional care plan information)  Objective:  Lab Results  Component Value Date   HGBA1C 7.2 (H) 10/08/2020 .   Lab Results  Component Value Date   CREATININE 1.12 10/08/2020   CREATININE 1.07 02/29/2020   CREATININE 1.20 09/04/2019 .   Marland Kitchen No results found for: EGFR  Current Barriers: Closing this care plan and opening in new ELS . Knowledge Deficits related to basic Diabetes pathophysiology and self care/management . Knowledge Deficits related to medications used for management of diabetes . Does not have glucometer to monitor blood sugar . Financial Constraints- unable to get healthy foods, eats at [employer] a lot from [employer] . Limited Social Support . Pain and discomfort from recent fall with fractured ribs  Case Manager Clinical Goal(s):  Over the next 120 days, patient will demonstrate improved adherence to prescribed treatment plan for diabetes self care/management as evidenced by:  . daily monitoring and recording of CBG  . adherence to ADA/  carb modified diet . exercise 3/4 days/week . adherence to prescribed medication regimen  Interventions:  . Provided education to patient about basic DM disease process . Reviewed medications with patient and discussed importance of medication adherence . Discussed plans with patient for ongoing care management follow up and provided patient with direct contact  information for care management team . Provided patient with written educational materials related to hypo and hyperglycemia and importance of correct treatment.  Currently the patient does not have a working meter. Hemoglobin A1C of 7.2 at MD visit on 10-08-2020.   Marland Kitchen Reviewed scheduled/upcoming provider appointments including: 11-05-2020 at 1:20 with Dr. Wynetta Emery . Advised patient, providing education and rationale, to check cbg daily  and record, calling pcp for findings outside established parameters.   . Referral made to pharmacy team for assistance with getting a blood glucose meter that does not cost the patient.  The patient can not afford 130.00 for meter and supplies. Script sent to [facility], when he went to pick up the meter they told him his cost would be 130.00.  Marland Kitchen Referral made to community resources care guide team for assistance with help with food resources due to cost constraints  . Review of patient status, including review of consultants reports, relevant laboratory and other test results, and medications completed.  Patient Self Care Activities:  . UNABLE to independently manage DM as evidence of current hemoglobin A1C of 7.2 and inability to check blood sugars . Checks blood sugars as prescribed and utilize hyper and hypoglycemia protocol as needed . Adheres to prescribed ADA/carb modified  Initial goal documentation     .  RNCM: Monitor and Manage My Blood Sugar-Diabetes Type 2        Timeframe:  Short-Term Goal Priority:  High Start Date:                             Expected End Date:    04-19-2021                   Follow Up Date 04-02-2021 Currently the patient does not have a glucose meter. Pharmacy consult to assist with expressed need.    - check blood sugar at prescribed times - check blood sugar before and after exercise - check blood sugar if I feel it is too high or too low    Why is this important?    Checking your blood sugar at home helps to keep it  from getting very high or very low.   Writing the results in a diary or log helps the doctor know how to care for you.   Your blood sugar log should have the time, date and the results.   Also, write down the amount of insulin or other medicine that you take.   Other information, like what you ate, exercise done and how you were feeling, will also be helpful.     Notes: The patient is not checking his blood sugars. The patient does not have a meter. States he can not afford 130.00. Pharmacy consult to help with cost constraints     .  COMPLETED: RNCM: Pt-"I buy what I can off the dollar menu" (pt-stated)        CARE PLAN ENTRY (see longtitudinal plan of care for additional care plan information)  Current Barriers: Closing this care plan and opening in new ELS . Chronic Disease Management support, education, and care coordination needs  related to HTN and HLD  Clinical Goal(s) related to HTN and HLD:  Over the next 120 days, patient will:  . Work with the care management team to address educational, disease management, and care coordination needs  . Begin or continue self health monitoring activities as directed today Measure and record blood pressure 2/3 times per week and adhere to a heart healthy/ADA diet . Call provider office for new or worsened signs and symptoms Blood pressure findings outside established parameters and New or worsened symptom related to HLD/HTN and other chronic conditions. . Call care management team with questions or concerns . Verbalize basic understanding of patient centered plan of care established today  Interventions related to HTN and HLD:  . Evaluation of current treatment plans and patient's adherence to plan as established by provider . Assessed patient understanding of disease states . Assessed patient's education and care coordination needs . Provided disease specific education to patient  . Collaborated with appropriate clinical care team members  regarding patient needs  Patient Self Care Activities related to HTN and HLD:  . Patient is unable to independently self-manage chronic health conditions  Initial goal documentation        The patient verbalized understanding of instructions, educational materials, and care plan provided today and declined offer to receive copy of patient instructions, educational materials, and care plan.   Telephone follow up appointment with care management team member scheduled for: 04-02-2021 at 0900 am  Noreene Larsson RN, MSN, [facility] Mobile: [phone]

## 2021-02-06 ENCOUNTER — Telehealth: Payer: Self-pay | Admitting: Pharmacist

## 2021-02-07 ENCOUNTER — Telehealth: Payer: Self-pay

## 2021-02-11 NOTE — Progress Notes (Signed)
Chronic Care Management Pharmacy Assistant   Name: Lucas Weiss  MRN: [id number] DOB: 1956-12-10  Reason for Encounter: General Follow Up     PCP : [name], DO  Allergies:  No Known Allergies  Medications: Outpatient Encounter Medications as of 02/06/2021  Medication Sig  . amLODipine (NORVASC) 5 MG tablet Take 1 tablet (5 mg total) by mouth daily.  Marland Kitchen aspirin EC 81 MG tablet Take 81 mg by mouth daily.  . Blood Glucose Monitoring Suppl (ONETOUCH VERIO) w/Device KIT To check blood sugar twice a day and document, bring to provider visits. (Patient not taking: Reported on 12/27/2020)  . empagliflozin (JARDIANCE) 25 MG TABS tablet Take 1 tablet (25 mg total) by mouth daily before breakfast.  . glucose blood (ONETOUCH VERIO) test strip To check blood sugar twice a day and document, bring to provider visits.  . Lancets (ONETOUCH ULTRASOFT) lancets To check blood sugar twice a day and document, bring to provider visits.  Marland Kitchen lisinopril (ZESTRIL) 40 MG tablet Take 1 tablet (40 mg total) by mouth daily.  Marland Kitchen lovastatin (MEVACOR) 20 MG tablet TAKE TWO TABLETS BY MOUTH EVERY NIGHT AT BEDTIME.  . metFORMIN (GLUCOPHAGE) 500 MG tablet TAKE TWO TABLETS BY MOUTH TWICE A DAY WITH FOOD  . metoprolol tartrate (LOPRESSOR) 50 MG tablet Take 1 tablet (50 mg total) by mouth daily.  . nitroGLYCERIN (NITROSTAT) 0.4 MG SL tablet Place 1 tablet (0.4 mg total) under the tongue every 5 (five) minutes as needed for chest pain.  Marland Kitchen QUEtiapine (SEROQUEL) 25 MG tablet Take 1 tablet (25 mg total) by mouth at bedtime. (Patient not taking: Reported on 12/27/2020)   No facility-administered encounter medications on file as of 02/06/2021.    Current Diagnosis: Patient Active Problem List   Diagnosis Date Noted  . Multiple rib fractures 10/08/2020  . Insomnia 05/06/2017  . History of left hip replacement 05/19/2016  . Controlled type 2 diabetes mellitus ([place]) 01/09/2016  . Tobacco abuse, in remission 01/09/2016   . Severe recurrent major depression ([place]) 06/14/2015  . Chronic pain 06/14/2015  . Alcohol abuse, in remission   . Atherosclerotic PVD with intermittent claudication ([place]) 10/25/2014  . Hypertension associated with diabetes ([place]) 09/17/2014  . Renal artery stenosis, native, bilateral ([place]) 02/20/2013  . CAD (coronary artery disease) 09/16/2012  . PAD (peripheral artery disease): Left common iliac stent plus additional stent(09/15/12) for ISRS. 09/16/2012  . S/P CABG x 5: 2009: LIMA to LAD, SVG to Diag, Seq SVG to OM2-3, SVG to PDA 09/16/2012  . Hypertensive heart failure ([place]) 09/16/2012  . Hyperlipidemia associated with type 2 diabetes mellitus ([place]) 09/16/2012    02-07-2021: The patient had a scheduled appointment with PharmD Birdena Crandall on 02-07-2021. The patient did not answer the phone. Attempted to reach out to the patient to check in on him. No answer/ left HIPAA compliant voicemail requesting a call back.   02-11-2021: Second attempt to connect with patient about missed appointment with PharmD Birdena Crandall. No answer and left HIPAA compliant voicemail requesting a call back.   02-11-2021: Incoming call from the patient. The patient gave his apologies for not calling and canceling his appointment with Birdena Crandall, but stated " I have had some issues going on." The patient continued to explain his mother had fallen on 02-03-2021 outside of her home and sustained some injuries. She passed 02-11-21 in the early hours. Gave my condolences to the patient and has family. Encouraged for him to reach out if he  was needing anything. Rescheduled his appointment with PharmD on 02-26-21 at 12:30 pm. Made PharmD Birdena Crandall aware of the situation.   Cloretta Ned, LPN Clinical Pharmacist Assistant  [phone]     Follow-Up:  Pharmacist Review

## 2021-02-26 ENCOUNTER — Ambulatory Visit (INDEPENDENT_AMBULATORY_CARE_PROVIDER_SITE_OTHER): Payer: Medicare HMO | Admitting: Pharmacist

## 2021-02-26 DIAGNOSIS — E785 Hyperlipidemia, unspecified: Secondary | ICD-10-CM

## 2021-02-26 DIAGNOSIS — E1165 Type 2 diabetes mellitus with hyperglycemia: Secondary | ICD-10-CM

## 2021-02-26 DIAGNOSIS — E1169 Type 2 diabetes mellitus with other specified complication: Secondary | ICD-10-CM | POA: Diagnosis not present

## 2021-02-26 NOTE — Progress Notes (Signed)
Chronic Care Management Pharmacy Note  03/06/2021 Name:  Lucas Weiss MRN:  [id number] DOB:  09-Feb-1956  Subjective: Lucas Weiss is an 65 y.o. year old male who is a primary patient of [name], DO.  The CCM team was consulted for assistance with disease management and care coordination needs.    Engaged with patient by telephone for initial visit in response to provider referral for pharmacy case management and/or care coordination services.   Consent to Services:  The patient was given the following information about Chronic Care Management services today, agreed to services, and gave verbal consent: 1. CCM service includes personalized support from designated clinical staff supervised by the primary care provider, including individualized plan of care and coordination with other care providers 2. 24/7 contact phone numbers for assistance for urgent and routine care needs. 3. Service will only be billed when office clinical staff spend 20 minutes or more in a month to coordinate care. 4. Only one practitioner may furnish and bill the service in a calendar month. 5.The patient may stop CCM services at any time (effective at the end of the month) by phone call to the office staff. 6. The patient will be responsible for cost sharing (co-pay) of up to 20% of the service fee (after annual deductible is met). Patient agreed to services and consent obtained.  Patient Care Team: [name], DO as PCP - General (Family Medicine) [name], MD as Consulting Physician (Orthopedic Surgery) Vanita Ingles, RN as Case Manager ([place]) Vladimir Faster, [facility] (Pharmacist)  Recent office visits: 12/27/20- Johnson(PCP) A1c 7.9,restart Jardiance 25 mg ,  tetanus vacc-open wound arm,     Hospital visits: 09/26/21- ED- multiple fractures s/p fall from deck 3-4 ft  Objective:  Lab Results  Component Value Date   CREATININE 1.12 10/08/2020   BUN 19 10/08/2020    GFRNONAA 70 10/08/2020   GFRAA 81 10/08/2020   NA 139 10/08/2020   K 4.6 10/08/2020   CALCIUM 9.0 10/08/2020   CO2 20 10/08/2020    Lab Results  Component Value Date/Time   HGBA1C 7.9 (H) 12/27/2020 02:10 PM   HGBA1C 7.2 (H) 10/08/2020 03:01 PM   MICROALBUR 150 (H) 10/08/2020 03:01 PM   MICROALBUR 150 (H) 09/04/2019 01:38 PM    Last diabetic Eye exam: No results found for: HMDIABEYEEXA  Last diabetic Foot exam: No results found for: HMDIABFOOTEX   Lab Results  Component Value Date   CHOL 179 10/08/2020   HDL 38 (L) 10/08/2020   LDLCALC 96 10/08/2020   TRIG 269 (H) 10/08/2020   CHOLHDL 6.3 07/17/2011    Hepatic Function Latest Ref Rng & Units 09/04/2019 02/14/2019 02/10/2018  Total Protein 6.0 - 8.5 g/dL 6.7 7.1 7.1  Albumin 3.8 - 4.8 g/dL 4.2 4.6 4.4  AST 0 - 40 IU/L 36 23 21  ALT 0 - 44 IU/L 40 29 29  Alk Phosphatase 39 - 117 IU/L 78 81 69  Total Bilirubin 0.0 - 1.2 mg/dL 0.3 0.3 <0.2    Lab Results  Component Value Date/Time   TSH 1.440 10/08/2020 03:05 PM   TSH 1.460 09/04/2019 01:40 PM    CBC Latest Ref Rng & Units 10/08/2020 09/04/2019 02/14/2019  WBC 3.4 - 10.8 x10E3/uL 7.1 6.8 7.9  Hemoglobin 13.0 - 17.7 g/dL 11.7(L) 13.2 14.1  Hematocrit 37.5 - 51.0 % 36.7(L) 39.4 42.6  Platelets 150 - 450 x10E3/uL 327 204 255    No results found for:  VD25OH  Clinical ASCVD: Yes  The ASCVD Risk score Mikey Bussing DC Jr., et al., 2013) failed to calculate for the following reasons:   The patient has a prior MI or stroke diagnosis    Depression screen [facility] 2/9 01/09/2020 07/31/2019 02/14/2019  Decreased Interest 0 0 0  Down, Depressed, Hopeless 0 0 0  PHQ - 2 Score 0 0 0  Altered sleeping - - -  Tired, decreased energy - - -  Change in appetite - - -  Feeling bad or failure about yourself  - - -  Trouble concentrating - - -  Moving slowly or fidgety/restless - - -  Suicidal thoughts - - -  PHQ-9 Score - - -  Difficult doing work/chores - - -  Some recent data might be hidden       Social History   Tobacco Use  Smoking Status Former Smoker  . Packs/day: 1.50  . Years: 30.00  . Pack years: 45.00  . Types: Cigarettes  . Quit date: 07/19/2008  . Years since quitting: 12.6  Smokeless Tobacco Never Used   BP Readings from Last 3 Encounters:  12/27/20 132/76  11/05/20 125/72  10/22/20 121/78   Pulse Readings from Last 3 Encounters:  12/27/20 73  11/05/20 79  10/22/20 80   Wt Readings from Last 3 Encounters:  12/27/20 184 lb 12.8 oz (83.8 kg)  11/05/20 180 lb (81.6 kg)  10/22/20 176 lb 3.2 oz (79.9 kg)    Assessment/Interventions: Review of patient past medical history, allergies, medications, health status, including review of consultants reports, laboratory and other test data, was performed as part of comprehensive evaluation and provision of chronic care management services.   SDOH:  (Social Determinants of Health) assessments and interventions performed: Yes   CCM Care Plan  No Known Allergies  Medications Reviewed Today    Reviewed by Vladimir Faster, [facility] (Pharmacist) on 03/06/21 at 1458  Med List Status: <None>  Medication Order Taking? Sig Documenting Provider Last Dose Status Informant  amLODipine (NORVASC) 5 MG tablet [id number] Yes Take 1 tablet (5 mg total) by mouth daily. [place] Liter P, DO Taking Active   aspirin EC 81 MG tablet [id number] No Take 81 mg by mouth daily.  Patient not taking: Reported on 02/26/2021   [provider] Not Taking Active   Blood Glucose Monitoring Suppl [facility] VERIO) w/Device KIT [id number] No To check blood sugar twice a day and document, bring to provider visits.  Patient not taking: No sig reported   Marnee Guarneri T, NP Not Taking Active   empagliflozin (JARDIANCE) 25 MG TABS tablet [id number] No Take 1 tablet (25 mg total) by mouth daily before breakfast.  Patient not taking: Reported on 02/26/2021   [name], DO Not Taking Active   glucose blood (ONETOUCH VERIO) test strip [id number]  No To check blood sugar twice a day and document, bring to provider visits.  Patient not taking: Reported on 02/26/2021   Marnee Guarneri T, NP Not Taking Active   Lancets [facility] ULTRASOFT) lancets [id number] No To check blood sugar twice a day and document, bring to provider visits.  Patient not taking: Reported on 03/06/2021   Marnee Guarneri T, NP Not Taking Active   lisinopril (ZESTRIL) 40 MG tablet [id number] Yes Take 1 tablet (40 mg total) by mouth daily. Johnson, Megan P, DO Taking Active   lovastatin (MEVACOR) 20 MG tablet [id number] Yes TAKE TWO TABLETS BY MOUTH EVERY NIGHT AT BEDTIME. [name], DO Taking Active  metFORMIN (GLUCOPHAGE) 500 MG tablet [id number] Yes TAKE TWO TABLETS BY MOUTH TWICE A DAY WITH FOOD Johnson, Megan P, DO Taking Active   metoprolol tartrate (LOPRESSOR) 50 MG tablet [id number] Yes Take 1 tablet (50 mg total) by mouth daily. Johnson, Megan P, DO Taking Active   nitroGLYCERIN (NITROSTAT) 0.4 MG SL tablet [id number] Yes Place 1 tablet (0.4 mg total) under the tongue every 5 (five) minutes as needed for chest pain. Johnson, Megan P, DO Taking Active   QUEtiapine (SEROQUEL) 25 MG tablet [id number] No Take 1 tablet (25 mg total) by mouth at bedtime.  Patient not taking: No sig reported   [name], DO Not Taking Active           Patient Active Problem List   Diagnosis Date Noted  . Multiple rib fractures 10/08/2020  . Insomnia 05/06/2017  . History of left hip replacement 05/19/2016  . Controlled type 2 diabetes mellitus ([place]) 01/09/2016  . Tobacco abuse, in remission 01/09/2016  . Severe recurrent major depression ([place]) 06/14/2015  . Chronic pain 06/14/2015  . Alcohol abuse, in remission   . Atherosclerotic PVD with intermittent claudication ([place]) 10/25/2014  . Hypertension associated with diabetes ([place]) 09/17/2014  . Renal artery stenosis, native, bilateral ([place]) 02/20/2013  . CAD (coronary artery disease) 09/16/2012  . PAD (peripheral artery  disease): Left common iliac stent plus additional stent(09/15/12) for ISRS. 09/16/2012  . S/P CABG x 5: 2009: LIMA to LAD, SVG to Diag, Seq SVG to OM2-3, SVG to PDA 09/16/2012  . Hypertensive heart failure ([place]) 09/16/2012  . Hyperlipidemia associated with type 2 diabetes mellitus ([place]) 09/16/2012    Immunization History  Administered Date(s) Administered  . Influenza Split 09/16/2012  . Influenza,inj,Quad PF,6+ Mos 09/06/2015, 09/14/2016, 09/09/2017, 10/17/2018, 09/04/2019, 10/04/2020  . Influenza-Unspecified 09/12/2014  . Pneumococcal Polysaccharide-23 05/28/2010, 05/20/2016  . Td 12/27/2020  . Tdap 05/28/2010    Conditions to be addressed/monitored:  Hypertension, Hyperlipidemia, Diabetes, Heart Failure, Coronary Artery Disease and Chronic pain,   Care Plan : [place] plan  Updates made by Vladimir Faster, [place] since 03/06/2021 12:00 AM    Problem: DM, HTN, HLD   Priority: High    Long-Range Goal: Disease management   This Visit's Progress: Not on track  Priority: High  Note:   Current Barriers:  . Unable to independently afford treatment regimen . Unable to independently monitor therapeutic efficacy . Unable to achieve control of diabetes  . Does not adhere to prescribed medication regimen . Does not contact provider office for questions/concerns   Pharmacist Clinical Goal(s):  Marland Kitchen Over the next 60 days, patient will verbalize ability to afford treatment regimen . achieve adherence to monitoring guidelines and medication adherence to achieve therapeutic efficacy . adhere to plan to optimize therapeutic regimen for diabetes as evidenced by report of adherence to recommended medication management changes . achieve ability to self administer medications as prescribed through use of pill box as evidenced by patient report . adhere to prescribed medication regimen as evidenced by SMBG values . contact provider office for questions/concerns as evidenced notation of same  in electronic health record through collaboration with PharmD and provider.  .   Interventions: . 1:1 collaboration with [name], DO regarding development and update of comprehensive plan of care as evidenced by provider attestation and co-signature . Inter-disciplinary care team collaboration (see longitudinal plan of care) . Comprehensive medication review performed; medication list updated in electronic medical record BP Readings from Last 3 Encounters:  12/27/20 132/76  11/05/20 125/72  10/22/20 121/78    Hypertension (BP goal <130/80) -Controlled -Current treatment: . Amlodipine 5 mg qd . Lisinopril 40 mg qd . Lopressor 50 mg daily -Medications previously tried: NA  -Current home readings: Not checking -Current dietary habits: not following ADA diet -Current exercise habits: no structured exercise -Denies hypotensive/hypertensive symptoms -Educated on BP goals and benefits of medications for prevention of heart attack, stroke and kidney damage; Daily salt intake goal < 2300 mg; Exercise goal of 150 minutes per week; Symptoms of hypotension and importance of maintaining adequate hydration; -Counseled to obtain BP monitor  & check BP at home  document, and provide log at future appointments -Counseled on diet and exercise extensively Recommended to continue current medication   Lab Results  Component Value Date   LDLCALC 96 10/08/2020    Hyperlipidemia/CAD/s/p CABG x 5: (LDL goal < 70) -Not ideally controlled -Current treatment: . Lovastatin 40 mg qd -Medications previously tried: NA  -Educated on Cholesterol goals;  Benefits of statin for ASCVD risk reduction; Importance of limiting foods high in cholesterol; Exercise goal of 150 minutes per week; -Counseled on diet and exercise extensively Recommended Change to high intensity statin given CAD and DM  Lab Results  Component Value Date   HGBA1C 7.9 (H) 12/27/2020   Last eGFr ~70 Diabetes (A1c goal  <7%) -Uncontrolled -Current medications: Marland Kitchen Metformin 1000 mg bid  -Medications previously tried: NA--jardiance is cost prohibitive -Current home glucose readings . fasting glucose: Not checking -- no glucometer  -Denies hypoglycemic/hyperglycemic symptoms  -Educated on A1c and blood sugar goals; Complications of diabetes including kidney damage, retinal damage, and cardiovascular disease; Exercise goal of 150 minutes per week; Benefits of routine self-monitoring of blood sugar;  -Counseled to check feet daily and get yearly eye exams -Counseled on diet and exercise extensively Recommended patient discuss insurance options with medicare/disability specialist  for options that better suit his needs Collaborated with [employer] to determine glucometer coverage issues-- Patient to take Medicare A&B card to pharmacy for billing Assessed patient finances. Jardiance patient assistance application mailed to patient 02/04/21. He will complete and bring in.  Heart Failure (Goal: manage symptoms and prevent exacerbations) does not Follow with cardiology -Not ideally controlled -Last ejection fraction: 55-60% (Date: 10/21- [facility] ed) -HF type: Diastolic -NYHA Class: I (no actitivty limitation) -AHA HF Stage: B (Heart disease present - no symptoms present) -Current treatment: . metoprolol tartrate 25 mg qd -Medications previously tried: unknown  -Current home BP/HR readings: not checking  -Educated on Importance of blood pressure control -Counseled on diet and exercise extensively Counseled on blood pressure monitoring Recommended obtain Reli On brand BP monitor at [facility]. recommend patient follow up with cardiology given his extensive history   Patient Goals/Self-Care Activities . Over the next 60 days, patient will:  - take medications as prescribed focus on medication adherence by pill box, calendar,  check glucose daily, document, and provide at future  appointments collaborate with provider on medication access solutions target a minimum of 150 minutes of moderate intensity exercise weekly engage in dietary modifications by reducing sodium, fried and fast food  Follow Up Plan: Telephone follow up appointment with care management team member scheduled for: 30-60 day      Medication Assistance: Application for Jardiance  medication assistance program. in process.  Anticipated assistance start date unknown.  See plan of care for additional detail.  Patient's preferred pharmacy is:  [facility]  [address] [place] [address] Phone: [phone] Fax: [phone]  Uses pill box? No - unknown reasons Pt endorses 80 % compliance  We discussed: Benefits of medication synchronization, packaging and delivery as well as enhanced pharmacist oversight with [employer]. Patient decided to: Continue current medication management strategy  Care Plan and Follow Up Patient Decision:  Patient agrees to Care Plan and Follow-up.  Plan: Telephone follow up appointment with care management team member scheduled for:  CPA one month, PharmD 3 month  Junita Push. Kenton Kingfisher PharmD, [place] [address] [facility] [phone]

## 2021-02-28 ENCOUNTER — Telehealth: Payer: Self-pay | Admitting: *Deleted

## 2021-02-28 NOTE — Telephone Encounter (Signed)
Pt Dropped off Pt Assistance program Application for [employer] to look over Placed in Julie's Files for review

## 2021-02-28 NOTE — Telephone Encounter (Signed)
In folder for MD signature.

## 2021-03-03 NOTE — Telephone Encounter (Signed)
In your folder for signature 

## 2021-03-06 NOTE — Patient Instructions (Addendum)
Visit Information  It was a pleasure speaking with you today! Thank you for letting me be a part of your care team. Please call with any questions or concerns.  Goals Addressed            This Visit's Progress   . Set My Target A1C-Diabetes Type 2       Timeframe:  Short-Term Goal Priority:  High Start Date:                             Expected End Date:                       Follow Up Date 2 month follow up - set target A1C   -obtain glucometer (part B card to pharmacy0 -Complete jardiance PAP Why is this important?    Your target A1C is decided together by you and your doctor.   It is based on several things like your age and other health issues.    Notes:     . Track and Manage My Blood Pressure-Hypertension       Timeframe:  Long-Range Goal Priority:  Medium Start Date:                             Expected End Date:                       Follow Up Date 2 month follow up     - check blood pressure 3 times per week - write blood pressure results in a log or diary    Why is this important?    You won't feel high blood pressure, but it can still hurt your blood vessels.   High blood pressure can cause heart or kidney problems. It can also cause a stroke.   Making lifestyle changes like losing a little weight or eating less salt will help.   Checking your blood pressure at home and at different times of the day can help to control blood pressure.   If the doctor prescribes medicine remember to take it the way the doctor ordered.   Call the office if you cannot afford the medicine or if there are questions about it.     Notes:        Lucas Weiss was given information about Chronic Care Management services today including:  1. CCM service includes personalized support from designated clinical staff supervised by his physician, including individualized plan of care and coordination with other care providers 2. 24/7 contact phone numbers for assistance for urgent  and routine care needs. 3. Standard insurance, coinsurance, copays and deductibles apply for chronic care management only during months in which we provide at least 20 minutes of these services. Most insurances cover these services at 100%, however patients may be responsible for any copay, coinsurance and/or deductible if applicable. This service may help you avoid the need for more expensive face-to-face services. 4. Only one practitioner may furnish and bill the service in a calendar month. 5. The patient may stop CCM services at any time (effective at the end of the month) by phone call to the office staff.  Patient agreed to services and verbal consent obtained.   The patient verbalized understanding of instructions, educational materials, and care plan provided today and declined offer to receive copy of patient instructions, educational materials,  and care plan.  Telephone follow up appointment with pharmacy team member scheduled for: CPA 2-4 weeks, PharmD 2 months  Lucas Weiss PharmD, BCPS Clinical Pharmacist [phone]  How to Take Your Blood Pressure Blood pressure measures how strongly your blood is pressing against the walls of your arteries. Arteries are blood vessels that carry blood from your heart throughout your body. You can take your blood pressure at home with a machine. You may need to check your blood pressure at home:  To check if you have high blood pressure (hypertension).  To check your blood pressure over time.  To make sure your blood pressure medicine is working. Supplies needed:  Blood pressure machine, or monitor.  Dining room chair to sit in.  Table or desk.  Small notebook.  Pencil or pen. How to prepare Avoid these things for 30 minutes before checking your blood pressure:  Having drinks with caffeine in them, such as coffee or tea.  Drinking alcohol.  Eating.  Smoking.  Exercising. Do these things five minutes before checking your  blood pressure:  Go to the bathroom and pee (urinate).  Sit in a dining chair. Do not sit in a soft couch or an armchair.  Be quiet. Do not talk. How to take your blood pressure Follow the instructions that came with your machine. If you have a digital blood pressure monitor, these may be the instructions: 1. Sit up straight. 2. Place your feet on the floor. Do not cross your ankles or legs. 3. Rest your left arm at the level of your heart. You may rest it on a table, desk, or chair. 4. Pull up your shirt sleeve. 5. Wrap the blood pressure cuff around the upper part of your left arm. The cuff should be 1 inch (2.5 cm) above your elbow. It is best to wrap the cuff around bare skin. 6. Fit the cuff snugly around your arm. You should be able to place only one finger between the cuff and your arm. 7. Place the cord so that it rests in the bend of your elbow. 8. Press the power button. 9. Sit quietly while the cuff fills with air and loses air. 10. Write down the numbers on the screen. 11. Wait 2-3 minutes and then repeat steps 1-10.   What do the numbers mean? Two numbers make up your blood pressure. The first number is called systolic pressure. The second is called diastolic pressure. An example of a blood pressure reading is "120 over 80" (or 120/80). If you are an adult and do not have a medical condition, use this guide to find out if your blood pressure is normal: Normal  First number: below 120.  Second number: below 80. Elevated  First number: 120-129.  Second number: below 80. Hypertension stage 1  First number: 130-139.  Second number: 80-89. Hypertension stage 2  First number: 140 or above.  Second number: 90 or above. Your blood pressure is above normal even if only the top or bottom number is above normal. Follow these instructions at home:  Check your blood pressure as often as your doctor tells you to.  Check your blood pressure at the same time every  day.  Take your monitor to your next doctor's appointment. Your doctor will: ? Make sure you are using it correctly. ? Make sure it is working right.  Make sure you understand what your blood pressure numbers should be.  Tell your doctor if your medicine is causing side  effects.  Keep all follow-up visits as told by your doctor. This is important. General tips:  You will need a blood pressure machine, or monitor. Your doctor can suggest a monitor. You can buy one at a drugstore or online. When choosing one: ? Choose one with an arm cuff. ? Choose one that wraps around your upper arm. Only one finger should fit between your arm and the cuff. ? Do not choose one that measures your blood pressure from your wrist or finger. Where to find more information American Heart Association: www.heart.org Contact a doctor if:  Your blood pressure keeps being high. Get help right away if:  Your first blood pressure number is higher than 180.  Your second blood pressure number is higher than 120. Summary  Check your blood pressure at the same time every day.  Avoid caffeine, alcohol, smoking, and exercise for 30 minutes before checking your blood pressure.  Make sure you understand what your blood pressure numbers should be. This information is not intended to replace advice given to you by your health care provider. Make sure you discuss any questions you have with your health care provider. Document Revised: 12/01/2019 Document Reviewed: 12/01/2019 Elsevier Patient Education  2021 [employer].

## 2021-03-26 ENCOUNTER — Telehealth: Payer: Self-pay

## 2021-03-27 ENCOUNTER — Encounter: Payer: Self-pay | Admitting: Family Medicine

## 2021-03-27 ENCOUNTER — Ambulatory Visit (INDEPENDENT_AMBULATORY_CARE_PROVIDER_SITE_OTHER): Payer: Medicare HMO | Admitting: Family Medicine

## 2021-03-27 ENCOUNTER — Other Ambulatory Visit: Payer: Self-pay

## 2021-03-27 VITALS — BP 159/89 | HR 74 | Temp 97.8°F | Wt 187.0 lb

## 2021-03-27 DIAGNOSIS — E1165 Type 2 diabetes mellitus with hyperglycemia: Secondary | ICD-10-CM

## 2021-03-27 DIAGNOSIS — E782 Mixed hyperlipidemia: Secondary | ICD-10-CM

## 2021-03-27 DIAGNOSIS — I110 Hypertensive heart disease with heart failure: Secondary | ICD-10-CM | POA: Diagnosis not present

## 2021-03-27 LAB — BAYER DCA HB A1C WAIVED: HB A1C (BAYER DCA - WAIVED): 8 % — ABNORMAL HIGH (ref <7.0)

## 2021-03-27 MED ORDER — LOVASTATIN 20 MG PO TABS: ORAL_TABLET | ORAL | 1 refills | Status: DC

## 2021-03-27 MED ORDER — METFORMIN HCL 500 MG PO TABS: ORAL_TABLET | ORAL | 1 refills | Status: DC

## 2021-03-27 MED ORDER — AMLODIPINE BESYLATE 5 MG PO TABS: 5.0000 mg | ORAL_TABLET | Freq: Every day | ORAL | 1 refills | Status: DC

## 2021-03-27 MED ORDER — METOPROLOL TARTRATE 50 MG PO TABS: 50.0000 mg | ORAL_TABLET | Freq: Every day | ORAL | 1 refills | Status: DC

## 2021-03-27 NOTE — Assessment & Plan Note (Signed)
Not under good control with A1c of 8, up from 7.9, has not been able to get his jardiance. Working with pharmacy. Continue to monitor. Refills given. Recheck 3 months.

## 2021-03-27 NOTE — Progress Notes (Signed)
BP (!) 159/89   Pulse 74   Temp 97.8 F (36.6 C)   Wt 187 lb (84.8 kg)   SpO2 97%   BMI 26.83 kg/m    Subjective:    Patient ID: Lucas Weiss, male    DOB: 08-16-1956, 65 y.o.   MRN: [id number]  HPI: Lucas Weiss is a 65 y.o. male  Chief Complaint  Patient presents with  . Diabetes   DIABETES- has not been taking his jardiance due to cost, working with pharmacist to try to get it less expensive. Hypoglycemic episodes:no Polydipsia/polyuria: no Visual disturbance: no Chest pain: no Paresthesias: no Glucose Monitoring: no  Accucheck frequency: Not Checking Taking Insulin?: no Blood Pressure Monitoring: not checking Retinal Examination: Up to Date Foot Exam: Up to Date Diabetic Education: Completed Pneumovax: Up to Date Influenza: Up to Date Aspirin: yes   Back is still hurting a lot. Pretty stable, but not good. Does not want to go to ortho, physiatry or PT. Would like percocet. No other concerns.  Relevant past medical, surgical, family and social history reviewed and updated as indicated. Interim medical history since our last visit reviewed. Allergies and medications reviewed and updated.  Review of Systems  Constitutional: Negative.   Respiratory: Negative.   Cardiovascular: Negative.   Gastrointestinal: Negative.   Musculoskeletal: Positive for back pain and myalgias. Negative for arthralgias, gait problem, joint swelling, neck pain and neck stiffness.  Skin: Negative.   Psychiatric/Behavioral: Negative.     Per HPI unless specifically indicated above     Objective:    BP (!) 159/89   Pulse 74   Temp 97.8 F (36.6 C)   Wt 187 lb (84.8 kg)   SpO2 97%   BMI 26.83 kg/m   Wt Readings from Last 3 Encounters:  03/27/21 187 lb (84.8 kg)  12/27/20 184 lb 12.8 oz (83.8 kg)  11/05/20 180 lb (81.6 kg)    Physical Exam Vitals and nursing note reviewed.  Constitutional:      General: He is not in acute distress.    Appearance: Normal appearance. He  is not ill-appearing, toxic-appearing or diaphoretic.  HENT:     Head: Normocephalic and atraumatic.     Right Ear: External ear normal.     Left Ear: External ear normal.     Nose: Nose normal.     Mouth/Throat:     Mouth: Mucous membranes are moist.     Pharynx: Oropharynx is clear.  Eyes:     General: No scleral icterus.       Right eye: No discharge.        Left eye: No discharge.     Extraocular Movements: Extraocular movements intact.     Conjunctiva/sclera: Conjunctivae normal.     Pupils: Pupils are equal, round, and reactive to light.  Cardiovascular:     Rate and Rhythm: Normal rate and regular rhythm.     Pulses: Normal pulses.     Heart sounds: Normal heart sounds. No murmur heard. No friction rub. No gallop.   Pulmonary:     Effort: Pulmonary effort is normal. No respiratory distress.     Breath sounds: Normal breath sounds. No stridor. No wheezing, rhonchi or rales.  Chest:     Chest wall: No tenderness.  Musculoskeletal:        General: Normal range of motion.     Cervical back: Normal range of motion and neck supple.  Skin:    General: Skin is warm and dry.  Capillary Refill: Capillary refill takes less than 2 seconds.     Coloration: Skin is not jaundiced or pale.     Findings: No bruising, erythema, lesion or rash.  Neurological:     General: No focal deficit present.     Mental Status: He is alert and oriented to person, place, and time. Mental status is at baseline.  Psychiatric:        Mood and Affect: Mood normal.        Behavior: Behavior normal.        Thought Content: Thought content normal.        Judgment: Judgment normal.     Results for orders placed or performed in visit on 12/27/20  Bayer DCA Hb A1c Waived  Result Value Ref Range   HB A1C (BAYER DCA - WAIVED) 7.9 (H) <7.0 %      Assessment & Plan:   Problem List Items Addressed This Visit      Cardiovascular and Mediastinum   Hypertensive heart failure (HCC)   Relevant  Medications   metoprolol tartrate (LOPRESSOR) 50 MG tablet   lovastatin (MEVACOR) 20 MG tablet   amLODipine (NORVASC) 5 MG tablet     Endocrine   Controlled type 2 diabetes mellitus (HCC) - Primary    Not under good control with A1c of 8, up from 7.9, has not been able to get his jardiance. Working with pharmacy. Continue to monitor. Refills given. Recheck 3 months.       Relevant Medications   metFORMIN (GLUCOPHAGE) 500 MG tablet   lovastatin (MEVACOR) 20 MG tablet   Other Relevant Orders   Bayer DCA Hb A1c Waived    Other Visit Diagnoses    Mixed hyperlipidemia       Relevant Medications   metoprolol tartrate (LOPRESSOR) 50 MG tablet   lovastatin (MEVACOR) 20 MG tablet   amLODipine (NORVASC) 5 MG tablet       Follow up plan: Return in about 3 months (around 06/26/2021).

## 2021-03-28 ENCOUNTER — Telehealth: Payer: Self-pay

## 2021-03-28 NOTE — Progress Notes (Signed)
    Chronic Care Management Pharmacy Assistant   Name: Lucas Weiss  MRN: [id number] DOB: 12-18-1956  Reason for Encounter: Patient Assistance  Medications: Outpatient Encounter Medications as of 03/28/2021  Medication Sig  . amLODipine (NORVASC) 5 MG tablet Take 1 tablet (5 mg total) by mouth daily.  Marland Kitchen aspirin EC 81 MG tablet Take 81 mg by mouth daily. (Patient not taking: No sig reported)  . Blood Glucose Monitoring Suppl (ONETOUCH VERIO) w/Device KIT To check blood sugar twice a day and document, bring to provider visits.  . empagliflozin (JARDIANCE) 25 MG TABS tablet Take 1 tablet (25 mg total) by mouth daily before breakfast. (Patient not taking: No sig reported)  . glucose blood (ONETOUCH VERIO) test strip To check blood sugar twice a day and document, bring to provider visits.  . Lancets (ONETOUCH ULTRASOFT) lancets To check blood sugar twice a day and document, bring to provider visits.  Marland Kitchen lisinopril (ZESTRIL) 40 MG tablet Take 1 tablet (40 mg total) by mouth daily.  Marland Kitchen lovastatin (MEVACOR) 20 MG tablet TAKE TWO TABLETS BY MOUTH EVERY NIGHT AT BEDTIME.  . metFORMIN (GLUCOPHAGE) 500 MG tablet TAKE TWO TABLETS BY MOUTH TWICE A DAY WITH FOOD  . metoprolol tartrate (LOPRESSOR) 50 MG tablet Take 1 tablet (50 mg total) by mouth daily.  . nitroGLYCERIN (NITROSTAT) 0.4 MG SL tablet Place 1 tablet (0.4 mg total) under the tongue every 5 (five) minutes as needed for chest pain.  Marland Kitchen QUEtiapine (SEROQUEL) 25 MG tablet Take 1 tablet (25 mg total) by mouth at bedtime. (Patient not taking: Reported on 03/27/2021)   No facility-administered encounter medications on file as of 03/28/2021.   I called [employer] to check the status of patient's application for Jardiance. Per [employer], [name] will need to complete an application for LIS before being considered for patient assistance through them.   I called and informed [name] that the application needed to be completed first. I supplied him with the  phone number for [employer] extra help and he will be contacting them soon.   Wilford Sports CPA, CMA

## 2021-04-02 ENCOUNTER — Telehealth: Payer: Self-pay

## 2021-04-02 ENCOUNTER — Telehealth: Payer: Self-pay | Admitting: General Practice

## 2021-04-02 NOTE — Telephone Encounter (Signed)
  Chronic Care Management   Outreach Note  04/02/2021 Name: Lucas Weiss MRN: [id number] DOB: [birth year]  Referred by: Dorcas Carrow, DO Reason for referral : Appointment (RNCM: Follow up for Chronic Disease Management and Care Coordination Needs )   An unsuccessful telephone outreach was attempted today. The patient was referred to the case management team for assistance with care management and care coordination.   Follow Up Plan: A HIPAA compliant phone message was left for the patient providing contact information and requesting a return call.   Alto Denver RN, MSN, CCM Community Care Coordinator [place]  [facility] Mobile: [phone]

## 2021-04-09 ENCOUNTER — Telehealth: Payer: Self-pay

## 2021-04-09 NOTE — Chronic Care Management (AMB) (Signed)
  Care Management   Note  04/09/2021 Name: Lucas Weiss MRN: [id number] DOB: Aug 04, 1956  Lucas Weiss is a 65 y.o. year old male who is a primary care patient of Dorcas Carrow, DO and is actively engaged with the care management team. I reached out to Lucas Weiss by phone today to assist with re-scheduling a follow up visit with the RN Case Manager  Follow up plan: Unsuccessful telephone outreach attempt made. A HIPAA compliant phone message was left for the patient providing contact information and requesting a return call.  The care management team will reach out to the patient again over the next 7 days.  If patient returns call to provider office, please advise to call Embedded Care Management Care Guide Lucas Weiss  at [phone]  Lucas Weiss, RMA Care Guide, Embedded Care Coordination [facility]  [place], [place] [address] Direct Dial: [phone] [email or web address] Website: [email or web address]

## 2021-04-14 NOTE — Chronic Care Management (AMB) (Signed)
  Care Management   Note  04/14/2021 Name: Lucas Weiss MRN: [id number] DOB: Aug 29, 1956  Lucas Weiss is a 65 y.o. year old male who is a primary care patient of Dorcas Carrow, DO and is actively engaged with the care management team. I reached out to [name] by phone today to assist with re-scheduling a follow up visit with the RN Case Manager  Follow up plan: Telephone appointment with care management team member scheduled for:05/21/2021  Penne Lash, RMA Care Guide, Embedded Care Coordination [facility]  [place], [place] [address] Direct Dial: [phone] [email or web address]: [email or web address]

## 2021-04-14 NOTE — Telephone Encounter (Signed)
Patient has been rescheduled.

## 2021-05-21 ENCOUNTER — Telehealth: Payer: Medicare HMO | Admitting: General Practice

## 2021-05-21 ENCOUNTER — Ambulatory Visit (INDEPENDENT_AMBULATORY_CARE_PROVIDER_SITE_OTHER): Payer: Medicare HMO

## 2021-05-21 DIAGNOSIS — I25119 Atherosclerotic heart disease of native coronary artery with unspecified angina pectoris: Secondary | ICD-10-CM | POA: Diagnosis not present

## 2021-05-21 DIAGNOSIS — E1159 Type 2 diabetes mellitus with other circulatory complications: Secondary | ICD-10-CM

## 2021-05-21 DIAGNOSIS — I152 Hypertension secondary to endocrine disorders: Secondary | ICD-10-CM

## 2021-05-21 DIAGNOSIS — E1165 Type 2 diabetes mellitus with hyperglycemia: Secondary | ICD-10-CM | POA: Diagnosis not present

## 2021-05-21 NOTE — Chronic Care Management (AMB) (Signed)
Chronic Care Management   CCM RN Visit Note  05/21/2021 Name: Lucas Weiss MRN: [id number] DOB: 09/26/1956  Subjective: Lucas Weiss is a 65 y.o. year old male who is a primary care patient of [name], DO. The care management team was consulted for assistance with disease management and care coordination needs.    Engaged with patient by telephone for follow up visit in response to provider referral for case management and/or care coordination services.   Consent to Services:  The patient was given information about Chronic Care Management services, agreed to services, and gave verbal consent prior to initiation of services.  Please see initial visit note for detailed documentation.   Patient agreed to services and verbal consent obtained.   Assessment: Review of patient past medical history, allergies, medications, health status, including review of consultants reports, laboratory and other test data, was performed as part of comprehensive evaluation and provision of chronic care management services.   SDOH (Social Determinants of Health) assessments and interventions performed:    CCM Care Plan  No Known Allergies  Outpatient Encounter Medications as of 05/21/2021  Medication Sig  . amLODipine (NORVASC) 5 MG tablet Take 1 tablet (5 mg total) by mouth daily.  Marland Kitchen aspirin EC 81 MG tablet Take 81 mg by mouth daily. (Patient not taking: No sig reported)  . Blood Glucose Monitoring Suppl (ONETOUCH VERIO) w/Device KIT To check blood sugar twice a day and document, bring to provider visits.  . empagliflozin (JARDIANCE) 25 MG TABS tablet Take 1 tablet (25 mg total) by mouth daily before breakfast. (Patient not taking: No sig reported)  . glucose blood (ONETOUCH VERIO) test strip To check blood sugar twice a day and document, bring to provider visits.  . Lancets (ONETOUCH ULTRASOFT) lancets To check blood sugar twice a day and document, bring to provider visits.  Marland Kitchen lisinopril (ZESTRIL)  40 MG tablet Take 1 tablet (40 mg total) by mouth daily.  Marland Kitchen lovastatin (MEVACOR) 20 MG tablet TAKE TWO TABLETS BY MOUTH EVERY NIGHT AT BEDTIME.  . metFORMIN (GLUCOPHAGE) 500 MG tablet TAKE TWO TABLETS BY MOUTH TWICE A DAY WITH FOOD  . metoprolol tartrate (LOPRESSOR) 50 MG tablet Take 1 tablet (50 mg total) by mouth daily.  . nitroGLYCERIN (NITROSTAT) 0.4 MG SL tablet Place 1 tablet (0.4 mg total) under the tongue every 5 (five) minutes as needed for chest pain.  Marland Kitchen QUEtiapine (SEROQUEL) 25 MG tablet Take 1 tablet (25 mg total) by mouth at bedtime. (Patient not taking: Reported on 03/27/2021)   No facility-administered encounter medications on file as of 05/21/2021.    Patient Active Problem List   Diagnosis Date Noted  . Multiple rib fractures 10/08/2020  . Insomnia 05/06/2017  . History of left hip replacement 05/19/2016  . Controlled type 2 diabetes mellitus ([place]) 01/09/2016  . Tobacco abuse, in remission 01/09/2016  . Severe recurrent major depression ([place]) 06/14/2015  . Chronic pain 06/14/2015  . Alcohol abuse, in remission   . Atherosclerotic PVD with intermittent claudication ([place]) 10/25/2014  . Hypertension associated with diabetes ([place]) 09/17/2014  . Renal artery stenosis, native, bilateral ([place]) 02/20/2013  . CAD (coronary artery disease) 09/16/2012  . PAD (peripheral artery disease): Left common iliac stent plus additional stent(09/15/12) for ISRS. 09/16/2012  . S/P CABG x 5: 2009: LIMA to LAD, SVG to Diag, Seq SVG to OM2-3, SVG to PDA 09/16/2012  . Hypertensive heart failure ([place]) 09/16/2012  . Hyperlipidemia associated with type 2 diabetes mellitus ([place]) 09/16/2012  Conditions to be addressed/monitored:CAD, HTN and DMII  Care Plan : RNCMN: Diabetes Type 2 (Adult)  Updates made by Inge Rise, RN since 05/21/2021 12:00 AM    Problem: RNCM: Glycemic Management (Diabetes, Type 2)   Priority: High    Goal: RNCM: Glycemic Management Optimized   Priority: High  Note:    .Objective:  . Lab Results .  Component . Value . Date .   Marland Kitchen HGBA1C . 8.0 (H) . 03/27/2021 .     Marland Kitchen Lab Results .  Component . Value . Date .   Marland Kitchen CREATININE . 1.12 . 10/08/2020 .   Marland Kitchen CREATININE . 1.07 . 02/29/2020 .   Marland Kitchen CREATININE . 1.20 . 09/04/2019 .    Marland Kitchen No results found for: EGFR Current Barriers:  Marland Kitchen Knowledge Deficits related to basic Diabetes pathophysiology and self care/management . Knowledge Deficits related to medications used for management of diabetes . Difficulty obtaining or cannot afford medications . Does not have glucometer to monitor blood sugar . Film/video editor . Cognitive Deficits . Limited Social Support . Unable to independently manage DM . Unable to self administer medications as prescribed . Does not adhere to provider recommendations re: checking blood sugars and medications as prescribed  . Does not adhere to prescribed medication regimen . Lacks social connections . Does not maintain contact with provider office . Does not contact provider office for questions/concerns  Case Manager Clinical Goal(s):  . patient will demonstrate improved adherence to prescribed treatment plan for diabetes self care/management as evidenced by: daily monitoring and recording of CBG  adherence to ADA/ carb modified diet exercise 3/4 days/week adherence to prescribed medication regimen Interventions:  . Collaboration with [name], DO regarding development and update of comprehensive plan of care as evidenced by provider attestation and co-signature . Inter-disciplinary care team collaboration (see longitudinal plan of care) . Provided education to patient about basic DM disease process. 05/21/2021 Reviewed education on S&S of hypoglycemia since patient has not been eating well over the last 5 days due to a GI issue: diarrhea with blood present and black stools.  Symptoms have relieved today and no further diarrhea or black stools.  Instructed patient to contact  [facility] office if GI symptoms return. . Reviewed medications with patient and discussed importance of medication adherence.  05/21/2021 Patient not able to afford Jardiance and has not started on it. Patient asking for different medication for diabetes that he can afford. . Discussed plans with patient for ongoing care management follow up and provided patient with direct contact information for care management team . Provided patient with written educational materials related to hypo and hyperglycemia and importance of correct treatment . Reviewed scheduled/upcoming provider appointments including: 06-26-2021 . Advised patient, providing education and rationale, to check cbg daily  and record, calling pcp for findings outside established parameters.   . Patient did obtain a glucose meter and is now checking his blood sugars once a week (usually in the evenings) with range in the lower 300 (328-329).  Patient checked blood glucose during contact with result 184.  Patient not taking Jardiance due to inability to purchase because of how expensive medication is. Patient asking for different medication for diabetes that he can afford. Patient is awaiting pharmacy contact to help with possibly obtaining Jardiance. . Review of patient status, including review of consultants reports, relevant laboratory and other test results, and medications completed. Patient Goals/Self-Care Activities . Over the next 120 days, patient will:  - UNABLE  to independently manage DM Self administers oral medications as prescribed Attends all scheduled provider appointments Checks blood sugars as prescribed and utilize hyper and hypoglycemia protocol as needed Adheres to prescribed ADA/carb modified- currently is not following a restricted diet. Discussed referral for care guides but the patient refuses at this time. Encouraged compliance  - barriers to adherence to treatment plan identified - blood glucose monitoring encouraged -  resources required to improve adherence to care identified - self-awareness of signs/symptoms of hypo or hyperglycemia encouraged  Follow Up Plan: Telephone follow up appointment with care management team member scheduled for:  08/27/2021 at 11:45am   Care Plan : RNCM: Hypertension (Adult)  Updates made by Inge Rise, RN since 05/21/2021 12:00 AM    Problem: RNCM: Hypertension (Hypertension)   Priority: Medium    Goal: RNCM: Hypertension Monitored   Priority: Medium  Note:   Objective:  . Last practice recorded BP readings:  . BP Readings from Last 3 Encounters: .  03/27/21 . (!) 932/67 .  12/27/20 . 132/76 .  11/05/20 . 125/72 .    Marland Kitchen Most recent eGFR/CrCl: No results found for: EGFR  No components found for: CRCL Current Barriers:  Marland Kitchen Knowledge Deficits related to basic understanding of hypertension pathophysiology and self care management . Knowledge Deficits related to understanding of medications prescribed for management of hypertension . Difficulty obtaining medications . Non-adherence to prescribed medication regimen . Cognitive Deficits . Unable to independently manage HTN . Lacks social connections . Does not contact provider office for questions/concerns Case Manager Clinical Goal(s):  Marland Kitchen Over the next 120 days, patient will verbalize understanding of plan for hypertension management . Over the next 120 days, patient will attend all scheduled medical appointments: 03-27-2021 . Over the next 120 days, patient will demonstrate improved adherence to prescribed treatment plan for hypertension as evidenced by taking all medications as prescribed, monitoring and recording blood pressure as directed, adhering to low sodium/DASH diet . Over the next 120 days, patient will demonstrate improved health management independence as evidenced by checking blood pressure as directed and notifying PCP if SBP>160 or DBP > 90, taking all medications as prescribe, and adhering to a low  sodium diet as discussed. . Over the next 120 days, patient will verbalize basic understanding of hypertension disease process and self health management plan as evidenced by compliance with heart healthy diet, compliance with medications, working with the CCM team to manage health and well being.  Interventions:  . Collaboration with [name], DO regarding development and update of comprehensive plan of care as evidenced by provider attestation and co-signature . Inter-disciplinary care team collaboration (see longitudinal plan of care) . Evaluation of current treatment plan related to hypertension self management and patient's adherence to plan as established by provider. 05/21/2021: Patient not consistent with monitoring blood pressure.  Patient checks blood pressure about once a month with systolic number around 124/?.  Patient states he can tell when his blood pressure is elevated or low. . Provided education to patient re: stroke prevention, s/s of heart attack and stroke, DASH diet, complications of uncontrolled blood pressure.  05/21/2021 Reviewed this education with patient. . Reviewed medications with patient and discussed importance of compliance . Discussed plans with patient for ongoing care management follow up and provided patient with direct contact information for care management team . Advised patient, providing education and rationale, to monitor blood pressure daily and record, calling PCP for findings outside established parameters.  . Reviewed scheduled/upcoming provider  appointments including: 06-26-2021 Patient Goals/Self-Care Activities . Over the next 120 days, patient will:  - Self administers medications as prescribed Attends all scheduled provider appointments Calls provider office for new concerns, questions, or BP outside discussed parameters Checks BP and records as discussed Follows a low sodium diet/DASH diet - depression screen reviewed - home or ambulatory  blood pressure monitoring encouraged Follow Up Plan: Telephone follow up appointment with care management team member scheduled for: 08-27-2021 at 11:45 am   Care Plan : RNCM: Coronary Artery Disease (Adult) and HLD  Updates made by Inge Rise, RN since 05/21/2021 12:00 AM    Problem: RNCM: Disease Progression (Coronary Artery Disease) and HLD   Priority: Medium    Goal: RNCM: Disease Progression Prevented or Minimizeda; CAD and HLD   Priority: Medium  Note:   Current Barriers:  . Poorly controlled hyperlipidemia, complicated by non-compliance with dietary restrictions, cost constraints, chronic conditions  . Current antihyperlipidemic regimen: Lovastatin 20 mg QD . Most recent lipid panel:  . Lab Results .  Component . Value . Date .   Marland Kitchen CHOL . 179 . 10/08/2020 .   Marland Kitchen CHOL . 201 (H) . 09/04/2019 .   Marland Kitchen CHOL . 225 (H) . 02/14/2019 .   Marland Kitchen Lab Results .  Component . Value . Date .   Marland Kitchen HDL . 38 (L) . 10/08/2020 .   Marland Kitchen HDL . 37 (L) . 09/04/2019 .   Marland Kitchen HDL . 40 . 02/14/2019 .   Marland Kitchen Lab Results .  Component . Value . Date .   Marland Kitchen [place] . 96 . 10/08/2020 .   Marland Kitchen [place] . 100 (H) . 09/04/2019 .   Marland Kitchen [place] . 128 (H) . 02/14/2019 .   Marland Kitchen Lab Results .  Component . Value . Date .   Marland Kitchen TRIG . 269 (H) . 10/08/2020 .   Marland Kitchen TRIG . 377 (H) . 09/04/2019 .   Marland Kitchen TRIG . 287 (H) . 02/14/2019 .   Marland Kitchen Lab Results .  Component . Value . Date .   Marland Kitchen CHOLHDL . 6.3 . 07/17/2011 .   Marland Kitchen CHOLHDL . 6.7 . 07/19/2008 .   Marland Kitchen No results found for: LDLDIRECT  . ASCVD risk enhancing conditions: age 37, DM, HTN, past history of stroke, former smoker . Unable to self administer medications as prescribed . Does not adhere to prescribed medication regimen . Lacks social connections . Does not contact provider office for questions/concerns  RN Care Manager Clinical Goal(s):  Marland Kitchen Over the next 120 days, patient will work with Consulting civil engineer, providers, and care team towards execution of optimized self-health management  plan . Over the next 120 days, patient will verbalize understanding of plan for effective management of HLD and CAD . Over the next 120 days, patient will work with [facility], CCM team and pcp to address needs related to HLD and CAD . Over the next 120 days, patient will attend all scheduled medical appointments: 03-27-2021  Interventions: . Collaboration with [name], DO regarding development and update of comprehensive plan of care as evidenced by provider attestation and co-signature . Inter-disciplinary care team collaboration (see longitudinal plan of care) . Medication review performed; medication list updated in electronic medical record.  Bertram Savin care team collaboration (see longitudinal plan of care) . Referred to pharmacy team for assistance with HLD medication management . Evaluation of current treatment plan related to HLD and CAD and patient's adherence to plan as established by provider. Marland Kitchen  Advised patient to call the office for changes in condition or question  . Provided education to patient re: benefits of heart health diet and following recommendations of the provider. 05/21/2021 Discussed with patient importance of heart healthy diet.  Patient states it is hard to get good healthy foods due to the cost of them. . Reviewed scheduled/upcoming provider appointments including: 06-26-2021 . Discussed plans with patient for ongoing care management follow up and provided patient with direct contact information for care management team Patient Goals/Self-Care Activities: . Over the next 120 days, patient will:   - call for medicine refill 2 or 3 days before it runs out - call if I am sick and can't take my medicine - keep a list of all the medicines I take; vitamins and herbals too - learn to read medicine labels - use a pillbox to sort medicine - use an alarm clock or phone to remind me to take my medicine - change to whole grain breads, cereal, pasta - drink 6 to 8  glasses of water each day - eat 3 to 5 servings of fruits and vegetables each day - eat 5 or 6 small meals each day - fill half the plate with nonstarchy vegetables - keep a food diary - limit fast food meals to no more than 1 per week - manage portion size - prepare main meal at home 3 to 5 days each week - read food labels for fat, fiber, carbohydrates and portion size - be open to making changes - I can manage, know and watch for signs of a heart attack - if I have chest pain, call for help - learn about small changes that will make a big difference - learn my personal risk factors  - barriers to treatment adherence reviewed and addressed - difficulty of making life-long changes acknowledged - functional limitation screening reviewed - healthy lifestyle promoted - individualized medical nutrition therapy provided - medication-adherence assessment completed - response to pharmacologic therapy monitored - self-awareness of signs/symptoms of worsening disease encouraged  Follow Up Plan: Telephone follow up appointment with care management team member scheduled for: 08-27-2021 at 11:45am       Plan:Telephone follow up appointment with care management team member scheduled for:  08-27-2021 at 11:45am  Salvatore Marvel RN, [facility] Mobile: [phone]

## 2021-05-21 NOTE — Patient Instructions (Addendum)
Patient Care Plan: RNCMN: Diabetes Type 2 (Adult)    Problem Identified: RNCM: Glycemic Management (Diabetes, Type 2)   Priority: High    Goal: RNCM: Glycemic Management Optimized   Priority: High  Note:   .Objective:  . Lab Results .  Component . Value . Date .   Marland Kitchen HGBA1C . 8.0 (H) . 03/27/2021 .     Marland Kitchen Lab Results .  Component . Value . Date .   Marland Kitchen CREATININE . 1.12 . 10/08/2020 .   Marland Kitchen CREATININE . 1.07 . 02/29/2020 .   Marland Kitchen CREATININE . 1.20 . 09/04/2019 .    Marland Kitchen No results found for: EGFR Current Barriers:  Marland Kitchen Knowledge Deficits related to basic Diabetes pathophysiology and self care/management . Knowledge Deficits related to medications used for management of diabetes . Difficulty obtaining or cannot afford medications . Does not have glucometer to monitor blood sugar . Film/video editor . Cognitive Deficits . Limited Social Support . Unable to independently manage DM . Unable to self administer medications as prescribed . Does not adhere to provider recommendations re: checking blood sugars and medications as prescribed  . Does not adhere to prescribed medication regimen . Lacks social connections . Does not maintain contact with provider office . Does not contact provider office for questions/concerns  Case Manager Clinical Goal(s):  . patient will demonstrate improved adherence to prescribed treatment plan for diabetes self care/management as evidenced by: daily monitoring and recording of CBG  adherence to ADA/ carb modified diet exercise 3/4 days/week adherence to prescribed medication regimen Interventions:  . Collaboration with [name], DO regarding development and update of comprehensive plan of care as evidenced by provider attestation and co-signature . Inter-disciplinary care team collaboration (see longitudinal plan of care) . Provided education to patient about basic DM disease process. 05/21/2021 Reviewed education on S&S of hypoglycemia since patient has  not been eating well over the last 5 days due to a GI issue: diarrhea with blood present and black stools.  Symptoms have relieved today and no further diarrhea or black stools.  Instructed patient to contact [facility] office if GI symptoms return. . Reviewed medications with patient and discussed importance of medication adherence.  05/21/2021 Patient not able to afford Jardiance and has not started on it. Patient asking for different medication for diabetes that he can afford. . Discussed plans with patient for ongoing care management follow up and provided patient with direct contact information for care management team . Provided patient with written educational materials related to hypo and hyperglycemia and importance of correct treatment . Reviewed scheduled/upcoming provider appointments including: 06-26-2021 . Advised patient, providing education and rationale, to check cbg daily  and record, calling pcp for findings outside established parameters.   . Patient did obtain a glucose meter and is now checking his blood sugars once a week (usually in the evenings) with range in the lower 300 (328-329).  Patient checked blood glucose during contact with result 184.  Patient not taking Jardiance due to inability to purchase because of how expensive medication is. Patient asking for different medication for diabetes that he can afford. Patient is awaiting pharmacy contact to help with possibly obtaining Jardiance. . Review of patient status, including review of consultants reports, relevant laboratory and other test results, and medications completed. Patient Goals/Self-Care Activities . Over the next 120 days, patient will:  - UNABLE to independently manage DM Self administers oral medications as prescribed Attends all scheduled provider appointments Checks blood sugars as prescribed  and utilize hyper and hypoglycemia protocol as needed Adheres to prescribed ADA/carb modified- currently is not following a  restricted diet. Discussed referral for care guides but the patient refuses at this time. Encouraged compliance  - barriers to adherence to treatment plan identified - blood glucose monitoring encouraged - resources required to improve adherence to care identified - self-awareness of signs/symptoms of hypo or hyperglycemia encouraged  Follow Up Plan: Telephone follow up appointment with care management team member scheduled for:  08/27/2021 at 11:45am   Task: RNCM: Alleviate Barriers to Glycemic Management   Note:   Care Management Activities:    - barriers to adherence to treatment plan identified - blood glucose monitoring encouraged - resources required to improve adherence to care identified - self-awareness of signs/symptoms of hypo or hyperglycemia encouraged    Notes: the patient currently does not have a glucose meter. Working the [employer] for assistance with meter.    Patient Care Plan: RNCM: Hypertension (Adult)    Problem Identified: RNCM: Hypertension (Hypertension)   Priority: Medium    Goal: RNCM: Hypertension Monitored   Priority: Medium  Note:   Objective:  . Last practice recorded BP readings:  . BP Readings from Last 3 Encounters: .  03/27/21 . (!) 761/47 .  12/27/20 . 132/76 .  11/05/20 . 125/72 .    Marland Kitchen Most recent eGFR/CrCl: No results found for: EGFR  No components found for: CRCL Current Barriers:  Marland Kitchen Knowledge Deficits related to basic understanding of hypertension pathophysiology and self care management . Knowledge Deficits related to understanding of medications prescribed for management of hypertension . Difficulty obtaining medications . Non-adherence to prescribed medication regimen . Cognitive Deficits . Unable to independently manage HTN . Lacks social connections . Does not contact provider office for questions/concerns Case Manager Clinical Goal(s):  Marland Kitchen Over the next 120 days, patient will verbalize understanding of plan for hypertension  management . Over the next 120 days, patient will attend all scheduled medical appointments: 03-27-2021 . Over the next 120 days, patient will demonstrate improved adherence to prescribed treatment plan for hypertension as evidenced by taking all medications as prescribed, monitoring and recording blood pressure as directed, adhering to low sodium/DASH diet . Over the next 120 days, patient will demonstrate improved health management independence as evidenced by checking blood pressure as directed and notifying PCP if SBP>160 or DBP > 90, taking all medications as prescribe, and adhering to a low sodium diet as discussed. . Over the next 120 days, patient will verbalize basic understanding of hypertension disease process and self health management plan as evidenced by compliance with heart healthy diet, compliance with medications, working with the CCM team to manage health and well being.  Interventions:  . Collaboration with [name], DO regarding development and update of comprehensive plan of care as evidenced by provider attestation and co-signature . Inter-disciplinary care team collaboration (see longitudinal plan of care) . Evaluation of current treatment plan related to hypertension self management and patient's adherence to plan as established by provider. 05/21/2021: Patient not consistent with monitoring blood pressure.  Patient checks blood pressure about once a month with systolic number around 092/?.  Patient states he can tell when his blood pressure is elevated or low. . Provided education to patient re: stroke prevention, s/s of heart attack and stroke, DASH diet, complications of uncontrolled blood pressure.  05/21/2021 Reviewed this education with patient. . Reviewed medications with patient and discussed importance of compliance . Discussed plans with patient for  ongoing care management follow up and provided patient with direct contact information for care management  team . Advised patient, providing education and rationale, to monitor blood pressure daily and record, calling PCP for findings outside established parameters.  . Reviewed scheduled/upcoming provider appointments including: 06-26-2021 Patient Goals/Self-Care Activities . Over the next 120 days, patient will:  - Self administers medications as prescribed Attends all scheduled provider appointments Calls provider office for new concerns, questions, or BP outside discussed parameters Checks BP and records as discussed Follows a low sodium diet/DASH diet - depression screen reviewed - home or ambulatory blood pressure monitoring encouraged Follow Up Plan: Telephone follow up appointment with care management team member scheduled for: 08-27-2021 at 11:45 am   Task: RNCM: Identify and Monitor Blood Pressure Elevation   Note:   Care Management Activities:    - depression screen reviewed - home or ambulatory blood pressure monitoring encouraged       Patient Care Plan: RNCM: Coronary Artery Disease (Adult) and HLD    Problem Identified: RNCM: Disease Progression (Coronary Artery Disease) and HLD   Priority: Medium    Goal: RNCM: Disease Progression Prevented or Minimizeda; CAD and HLD   Priority: Medium  Note:   Current Barriers:  . Poorly controlled hyperlipidemia, complicated by non-compliance with dietary restrictions, cost constraints, chronic conditions  . Current antihyperlipidemic regimen: Lovastatin 20 mg QD . Most recent lipid panel:  . Lab Results .  Component . Value . Date .   Marland Kitchen CHOL . 179 . 10/08/2020 .   Marland Kitchen CHOL . 201 (H) . 09/04/2019 .   Marland Kitchen CHOL . 225 (H) . 02/14/2019 .   Marland Kitchen Lab Results .  Component . Value . Date .   Marland Kitchen HDL . 38 (L) . 10/08/2020 .   Marland Kitchen HDL . 37 (L) . 09/04/2019 .   Marland Kitchen HDL . 40 . 02/14/2019 .   Marland Kitchen Lab Results .  Component . Value . Date .   Marland Kitchen [place] . 96 . 10/08/2020 .   Marland Kitchen [place] . 100 (H) . 09/04/2019 .   Marland Kitchen [place] . 128 (H) . 02/14/2019 .   Marland Kitchen Lab  Results .  Component . Value . Date .   Marland Kitchen TRIG . 269 (H) . 10/08/2020 .   Marland Kitchen TRIG . 377 (H) . 09/04/2019 .   Marland Kitchen TRIG . 287 (H) . 02/14/2019 .   Marland Kitchen Lab Results .  Component . Value . Date .   Marland Kitchen CHOLHDL . 6.3 . 07/17/2011 .   Marland Kitchen CHOLHDL . 6.7 . 07/19/2008 .   Marland Kitchen No results found for: LDLDIRECT  . ASCVD risk enhancing conditions: age 6, DM, HTN, past history of stroke, former smoker . Unable to self administer medications as prescribed . Does not adhere to prescribed medication regimen . Lacks social connections . Does not contact provider office for questions/concerns  RN Care Manager Clinical Goal(s):  Marland Kitchen Over the next 120 days, patient will work with Consulting civil engineer, providers, and care team towards execution of optimized self-health management plan . Over the next 120 days, patient will verbalize understanding of plan for effective management of HLD and CAD . Over the next 120 days, patient will work with [facility], CCM team and pcp to address needs related to HLD and CAD . Over the next 120 days, patient will attend all scheduled medical appointments: 03-27-2021  Interventions: . Collaboration with [name], DO regarding development and update of comprehensive plan of care as evidenced  by provider attestation and co-signature . Inter-disciplinary care team collaboration (see longitudinal plan of care) . Medication review performed; medication list updated in electronic medical record.  Bertram Savin care team collaboration (see longitudinal plan of care) . Referred to pharmacy team for assistance with HLD medication management . Evaluation of current treatment plan related to HLD and CAD and patient's adherence to plan as established by provider. . Advised patient to call the office for changes in condition or question  . Provided education to patient re: benefits of heart health diet and following recommendations of the provider. 05/21/2021 Discussed with patient importance of  heart healthy diet.  Patient states it is hard to get good healthy foods due to the cost of them. . Reviewed scheduled/upcoming provider appointments including: 06-26-2021 . Discussed plans with patient for ongoing care management follow up and provided patient with direct contact information for care management team Patient Goals/Self-Care Activities: . Over the next 120 days, patient will:   - call for medicine refill 2 or 3 days before it runs out - call if I am sick and can't take my medicine - keep a list of all the medicines I take; vitamins and herbals too - learn to read medicine labels - use a pillbox to sort medicine - use an alarm clock or phone to remind me to take my medicine - change to whole grain breads, cereal, pasta - drink 6 to 8 glasses of water each day - eat 3 to 5 servings of fruits and vegetables each day - eat 5 or 6 small meals each day - fill half the plate with nonstarchy vegetables - keep a food diary - limit fast food meals to no more than 1 per week - manage portion size - prepare main meal at home 3 to 5 days each week - read food labels for fat, fiber, carbohydrates and portion size - be open to making changes - I can manage, know and watch for signs of a heart attack - if I have chest pain, call for help - learn about small changes that will make a big difference - learn my personal risk factors  - barriers to treatment adherence reviewed and addressed - difficulty of making life-long changes acknowledged - functional limitation screening reviewed - healthy lifestyle promoted - individualized medical nutrition therapy provided - medication-adherence assessment completed - response to pharmacologic therapy monitored - self-awareness of signs/symptoms of worsening disease encouraged  Follow Up Plan: Telephone follow up appointment with care management team member scheduled for: 08-27-2021 at 11:45am     Task: RNCM: Alleviate Barriers to Coronary  Artery Disease Therapy   Note:   Care Management Activities:    - barriers to treatment adherence reviewed and addressed - difficulty of making life-long changes acknowledged - functional limitation screening reviewed - healthy lifestyle promoted - individualized medical nutrition therapy provided - medication-adherence assessment completed - response to pharmacologic therapy monitored - self-awareness of signs/symptoms of worsening disease encouraged       Patient Care Plan: RNCM: Stroke (Adult)    Problem Identified: RNCM: Emotional Adjustment to Disease (Stroke)   Priority: Medium    Long-Range Goal: RNCM: Optimal Coping   Priority: Medium  Note:   Current Barriers:  Marland Kitchen Knowledge Deficits related to resources to help with expressed needs for patient with chronic conditions and post residual stroke management  . Chronic Disease Management support and education needs related to past history of stroke  . Film/video editor.  . Cognitive Deficits .  Unable to self administer medications as prescribed . Does not adhere to provider recommendations re: dietary restrictions  . Does not adhere to prescribed medication regimen . Lacks social connections . Does not contact provider office for questions/concerns  Nurse Case Manager Clinical Goal(s):  Marland Kitchen Over the next 120 days, patient will verbalize understanding of plan for effective management of post residual stroke  . Over the next 120 days, patient will work with [facility], [place] team, and pcp to address needs related to meeting patients needs due to cognitive concerns and changes in condition  . Over the next 120 days, patient will attend all scheduled medical appointments: 03-27-2021  Interventions:  . 1:1 collaboration with [name], DO regarding development and update of comprehensive plan of care as evidenced by provider attestation and co-signature . Inter-disciplinary care team collaboration (see longitudinal plan of  care) . Evaluation of current treatment plan related to post residual stroke management  and patient's adherence to plan as established by provider. . Advised patient to call the office for changes in condition or new questions  . Provided education to patient re: following plan of care, adherence to a heart healthy/ADA diet, safety, and compliance with medications . Discussed plans with patient for ongoing care management follow up and provided patient with direct contact information for care management team  Patient Goals/Self-Care Activities Over the next 120 days, patient will:  - Patient will self administer medications as prescribed Patient will attend all scheduled provider appointments Patient will call pharmacy for medication refills Patient will attend church or other social activities Patient will continue to perform ADL's independently Patient will continue to perform IADL's independently Patient will call provider office for new concerns or questions Patient will work with BSW to address care coordination needs and will continue to work with the clinical team to address health care and disease management related needs.   - counseling provided - decision-making supported - depression screen reviewed - goal-setting facilitated - positive reinforcement provided - problem-solving facilitated - relaxation techniques promoted - self-care encouraged - self-reflection promoted - verbalization of feelings encouraged  Follow Up Plan: Telephone follow up appointment with care management team member scheduled for: 04-02-2021 at 0900 am       Task: RNCM: Support Psychosocial Response to Stroke   Note:   Care Management Activities:    - counseling provided - decision-making supported - depression screen reviewed - goal-setting facilitated - positive reinforcement provided - problem-solving facilitated - relaxation techniques promoted - self-care encouraged - self-reflection  promoted - verbalization of feelings encouraged       Patient Care Plan: CCM Pharmacy Care plan    Problem Identified: DM, HTN, HLD   Priority: High    Long-Range Goal: Disease management   This Visit's Progress: Not on track  Priority: High  Note:   Current Barriers:  . Unable to independently afford treatment regimen . Unable to independently monitor therapeutic efficacy . Unable to achieve control of diabetes  . Does not adhere to prescribed medication regimen . Does not contact provider office for questions/concerns   Pharmacist Clinical Goal(s):  Marland Kitchen Over the next 60 days, patient will verbalize ability to afford treatment regimen . achieve adherence to monitoring guidelines and medication adherence to achieve therapeutic efficacy . adhere to plan to optimize therapeutic regimen for diabetes as evidenced by report of adherence to recommended medication management changes . achieve ability to self administer medications as prescribed through use of pill box as evidenced by patient report .  adhere to prescribed medication regimen as evidenced by SMBG values . contact provider office for questions/concerns as evidenced notation of same in electronic health record through collaboration with PharmD and provider.  .   Interventions: . 1:1 collaboration with [name], DO regarding development and update of comprehensive plan of care as evidenced by provider attestation and co-signature . Inter-disciplinary care team collaboration (see longitudinal plan of care) . Comprehensive medication review performed; medication list updated in electronic medical record BP Readings from Last 3 Encounters:  12/27/20 132/76  11/05/20 125/72  10/22/20 121/78    Hypertension (BP goal <130/80) -Controlled -Current treatment: . Amlodipine 5 mg qd . Lisinopril 40 mg qd . Lopressor 50 mg daily -Medications previously tried: NA  -Current home readings: Not checking -Current dietary  habits: not following ADA diet -Current exercise habits: no structured exercise -Denies hypotensive/hypertensive symptoms -Educated on BP goals and benefits of medications for prevention of heart attack, stroke and kidney damage; Daily salt intake goal < 2300 mg; Exercise goal of 150 minutes per week; Symptoms of hypotension and importance of maintaining adequate hydration; -Counseled to obtain BP monitor  & check BP at home  document, and provide log at future appointments -Counseled on diet and exercise extensively Recommended to continue current medication   Lab Results  Component Value Date   LDLCALC 96 10/08/2020    Hyperlipidemia/CAD/s/p CABG x 5: (LDL goal < 70) -Not ideally controlled -Current treatment: . Lovastatin 40 mg qd -Medications previously tried: NA  -Educated on Cholesterol goals;  Benefits of statin for ASCVD risk reduction; Importance of limiting foods high in cholesterol; Exercise goal of 150 minutes per week; -Counseled on diet and exercise extensively Recommended Change to high intensity statin given CAD and DM  Lab Results  Component Value Date   HGBA1C 7.9 (H) 12/27/2020   Last eGFr ~70 Diabetes (A1c goal <7%) -Uncontrolled -Current medications: Marland Kitchen Metformin 1000 mg bid  -Medications previously tried: NA--jardiance is cost prohibitive -Current home glucose readings . fasting glucose: Not checking -- no glucometer  -Denies hypoglycemic/hyperglycemic symptoms  -Educated on A1c and blood sugar goals; Complications of diabetes including kidney damage, retinal damage, and cardiovascular disease; Exercise goal of 150 minutes per week; Benefits of routine self-monitoring of blood sugar;  -Counseled to check feet daily and get yearly eye exams -Counseled on diet and exercise extensively Recommended patient discuss insurance options with medicare/disability specialist  for options that better suit his needs Collaborated with [place]  to determine glucometer coverage issues-- Patient to take Medicare A&B card to pharmacy for billing Assessed patient finances. Jardiance patient assistance application mailed to patient 02/04/21. He will complete and bring in.  Heart Failure (Goal: manage symptoms and prevent exacerbations) does not Follow with cardiology -Not ideally controlled -Last ejection fraction: 55-60% (Date: 10/21- [facility] ed) -HF type: Diastolic -NYHA Class: I (no actitivty limitation) -AHA HF Stage: B (Heart disease present - no symptoms present) -Current treatment: . metoprolol tartrate 25 mg qd -Medications previously tried: unknown  -Current home BP/HR readings: not checking  -Educated on Importance of blood pressure control -Counseled on diet and exercise extensively Counseled on blood pressure monitoring Recommended obtain Reli On brand BP monitor at [facility]. recommend patient follow up with cardiology given his extensive history   Patient Goals/Self-Care Activities . Over the next 60 days, patient will:  - take medications as prescribed focus on medication adherence by pill box, calendar,  check glucose daily, document, and provide at future appointments collaborate with provider  on medication access solutions target a minimum of 150 minutes of moderate intensity exercise weekly engage in dietary modifications by reducing sodium, fried and fast food  Follow Up Plan: Telephone follow up appointment with care management team member scheduled for: 08-27-2021 at 11:45am

## 2021-05-21 NOTE — Progress Notes (Signed)
Looks like there is an appt scheduled for 05/26/2021

## 2021-05-26 ENCOUNTER — Telehealth: Payer: Self-pay | Admitting: Pharmacist

## 2021-05-26 NOTE — Chronic Care Management (AMB) (Signed)
    Chronic Care Management Pharmacy Assistant   Name: Lucas Weiss  MRN: [id number] DOB: [birth year]   Reason for Encounter: Patient Assistance application for Farxiga    Medications: Outpatient Encounter Medications as of 05/26/2021  Medication Sig  . amLODipine (NORVASC) 5 MG tablet Take 1 tablet (5 mg total) by mouth daily.  Marland Kitchen aspirin EC 81 MG tablet Take 81 mg by mouth daily. (Patient not taking: No sig reported)  . Blood Glucose Monitoring Suppl (ONETOUCH VERIO) w/Device KIT To check blood sugar twice a day and document, bring to provider visits.  . empagliflozin (JARDIANCE) 25 MG TABS tablet Take 1 tablet (25 mg total) by mouth daily before breakfast. (Patient not taking: No sig reported)  . glucose blood (ONETOUCH VERIO) test strip To check blood sugar twice a day and document, bring to provider visits.  . Lancets (ONETOUCH ULTRASOFT) lancets To check blood sugar twice a day and document, bring to provider visits.  Marland Kitchen lisinopril (ZESTRIL) 40 MG tablet Take 1 tablet (40 mg total) by mouth daily.  Marland Kitchen lovastatin (MEVACOR) 20 MG tablet TAKE TWO TABLETS BY MOUTH EVERY NIGHT AT BEDTIME.  . metFORMIN (GLUCOPHAGE) 500 MG tablet TAKE TWO TABLETS BY MOUTH TWICE A DAY WITH FOOD  . metoprolol tartrate (LOPRESSOR) 50 MG tablet Take 1 tablet (50 mg total) by mouth daily.  . nitroGLYCERIN (NITROSTAT) 0.4 MG SL tablet Place 1 tablet (0.4 mg total) under the tongue every 5 (five) minutes as needed for chest pain.  Marland Kitchen QUEtiapine (SEROQUEL) 25 MG tablet Take 1 tablet (25 mg total) by mouth at bedtime. (Patient not taking: Reported on 03/27/2021)   No facility-administered encounter medications on file as of 05/26/2021.    Completed patient assistance application form for Farxiga and mailed out to patient. Patient is aware to complete the form and has been advised to mail the form back or bring into his PCP's office.    Corrie Mckusick, [place]

## 2021-05-26 NOTE — Progress Notes (Incomplete)
Chronic Care Management Pharmacy Note  05/26/2021 Name:  Lucas Weiss MRN:  [id number] DOB:  1956/05/22  Subjective: Lucas Weiss is an 65 y.o. year old male who is a primary patient of [name], DO.  The CCM team was consulted for assistance with disease management and care coordination needs.    Engaged with patient by telephone for follow up visit in response to provider referral for pharmacy case management and/or care coordination services.   Consent to Services:  The patient was given information about Chronic Care Management services, agreed to services, and gave verbal consent prior to initiation of services.  Please see initial visit note for detailed documentation.   Patient Care Team: [name], DO as PCP - General (Family Medicine) [name], MD as Consulting Physician (Orthopedic Surgery) Vanita Ingles, RN as Case Manager ([place]) Vladimir Faster, [facility] (Pharmacist)  Recent office visits: 03/27/21-Johnson (PCP)- A1c 8.0 12/27/20- Johnson(PCP) A1c 7.9,restart Jardiance 25 mg ,  tetanus vacc-open wound arm,     Hospital visits: 09/26/21- ED- multiple fractures s/p fall from deck 3-4 ft  Objective:  Lab Results  Component Value Date   CREATININE 1.12 10/08/2020   BUN 19 10/08/2020   GFRNONAA 70 10/08/2020   GFRAA 81 10/08/2020   NA 139 10/08/2020   K 4.6 10/08/2020   CALCIUM 9.0 10/08/2020   CO2 20 10/08/2020    Lab Results  Component Value Date/Time   HGBA1C 8.0 (H) 03/27/2021 01:28 PM   HGBA1C 7.9 (H) 12/27/2020 02:10 PM   MICROALBUR 150 (H) 10/08/2020 03:01 PM   MICROALBUR 150 (H) 09/04/2019 01:38 PM    Last diabetic Eye exam: No results found for: HMDIABEYEEXA  Last diabetic Foot exam: No results found for: HMDIABFOOTEX   Lab Results  Component Value Date   CHOL 179 10/08/2020   HDL 38 (L) 10/08/2020   LDLCALC 96 10/08/2020   TRIG 269 (H) 10/08/2020   CHOLHDL 6.3 07/17/2011    Hepatic Function Latest Ref  Rng & Units 09/04/2019 02/14/2019 02/10/2018  Total Protein 6.0 - 8.5 g/dL 6.7 7.1 7.1  Albumin 3.8 - 4.8 g/dL 4.2 4.6 4.4  AST 0 - 40 IU/L 36 23 21  ALT 0 - 44 IU/L 40 29 29  Alk Phosphatase 39 - 117 IU/L 78 81 69  Total Bilirubin 0.0 - 1.2 mg/dL 0.3 0.3 <0.2    Lab Results  Component Value Date/Time   TSH 1.440 10/08/2020 03:05 PM   TSH 1.460 09/04/2019 01:40 PM    CBC Latest Ref Rng & Units 10/08/2020 09/04/2019 02/14/2019  WBC 3.4 - 10.8 x10E3/uL 7.1 6.8 7.9  Hemoglobin 13.0 - 17.7 g/dL 11.7(L) 13.2 14.1  Hematocrit 37.5 - 51.0 % 36.7(L) 39.4 42.6  Platelets 150 - 450 x10E3/uL 327 204 255    No results found for: VD25OH  Clinical ASCVD: Yes  The ASCVD Risk score Mikey Bussing DC Jr., et al., 2013) failed to calculate for the following reasons:   The patient has a prior MI or stroke diagnosis    Depression screen [facility] 2/9 01/09/2020 07/31/2019 02/14/2019  Decreased Interest 0 0 0  Down, Depressed, Hopeless 0 0 0  PHQ - 2 Score 0 0 0  Altered sleeping - - -  Tired, decreased energy - - -  Change in appetite - - -  Feeling bad or failure about yourself  - - -  Trouble concentrating - - -  Moving slowly or fidgety/restless - - -  Suicidal thoughts - - -  PHQ-9 Score - - -  Difficult doing work/chores - - -  Some recent data might be hidden      Social History   Tobacco Use  Smoking Status Former Smoker  . Packs/day: 1.50  . Years: 30.00  . Pack years: 45.00  . Types: Cigarettes  . Quit date: 07/19/2008  . Years since quitting: 12.8  Smokeless Tobacco Never Used   BP Readings from Last 3 Encounters:  03/27/21 (!) 159/89  12/27/20 132/76  11/05/20 125/72   Pulse Readings from Last 3 Encounters:  03/27/21 74  12/27/20 73  11/05/20 79   Wt Readings from Last 3 Encounters:  03/27/21 187 lb (84.8 kg)  12/27/20 184 lb 12.8 oz (83.8 kg)  11/05/20 180 lb (81.6 kg)    Assessment/Interventions: Review of patient past medical history, allergies, medications, health status,  including review of consultants reports, laboratory and other test data, was performed as part of comprehensive evaluation and provision of chronic care management services.   SDOH:  (Social Determinants of Health) assessments and interventions performed: Yes    Immunization History  Administered Date(s) Administered  . Influenza Split 09/16/2012  . Influenza,inj,Quad PF,6+ Mos 09/06/2015, 09/14/2016, 09/09/2017, 10/17/2018, 09/04/2019, 10/04/2020  . Influenza-Unspecified 09/12/2014  . Pneumococcal Polysaccharide-23 05/28/2010, 05/20/2016  . Td 12/27/2020  . Tdap 05/28/2010    Conditions to be addressed/monitored:  Hypertension, Hyperlipidemia, Diabetes, Heart Failure, Coronary Artery Disease and Chronic pain,   There are no care plans that you recently modified to display for this patient.    Medication Assistance: Application for Jardiance  medication assistance program. in process.  Anticipated assistance start date unknown.  See plan of care for additional detail.  Patient's preferred pharmacy is:  [facility], [place] [address] Phone: [phone] Fax: [phone]  Uses pill box? No - unknown reasons Pt endorses 80 % compliance  We discussed: Benefits of medication synchronization, packaging and delivery as well as enhanced pharmacist oversight with [employer]. Patient decided to: Continue current medication management strategy  Care Plan and Follow Up Patient Decision:  Patient agrees to Care Plan and Follow-up.  Plan: Telephone follow up appointment with care management team member scheduled for:  CPA one month, PharmD 3 month  Junita Push. Kenton Kingfisher PharmD, [facility] [phone]  Current Barriers:  . Unable to independently afford treatment regimen . Unable to independently monitor therapeutic efficacy . Unable to  achieve control of diabetes  . Does not adhere to prescribed medication regimen . Does not contact provider office for questions/concerns   Pharmacist Clinical Goal(s):  Marland Kitchen Over the next 60 days, patient will verbalize ability to afford treatment regimen . achieve adherence to monitoring guidelines and medication adherence to achieve therapeutic efficacy . adhere to plan to optimize therapeutic regimen for diabetes as evidenced by report of adherence to recommended medication management changes . achieve ability to self administer medications as prescribed through use of pill box as evidenced by patient report . adhere to prescribed medication regimen as evidenced by SMBG values . contact provider office for questions/concerns as evidenced notation of same in electronic health record through collaboration with PharmD and provider.  .   Interventions: . 1:1 collaboration with [name], DO regarding development and update of comprehensive plan of care as evidenced by provider attestation and co-signature . Inter-disciplinary care team collaboration (see longitudinal plan of  care) . Comprehensive medication review performed; medication list updated in electronic medical record BP Readings from Last 3 Encounters:  12/27/20 132/76  11/05/20 125/72  10/22/20 121/78    Hypertension (BP goal <130/80) -Controlled -Current treatment: . Amlodipine 5 mg qd . Lisinopril 40 mg qd . Lopressor 50 mg daily -Medications previously tried: NA  -Current home readings: Not checking -Current dietary habits: not following ADA diet -Current exercise habits: no structured exercise -Denies hypotensive/hypertensive symptoms -Educated on BP goals and benefits of medications for prevention of heart attack, stroke and kidney damage; Daily salt intake goal < 2300 mg; Exercise goal of 150 minutes per week; Symptoms of hypotension and importance of maintaining adequate hydration; -Counseled to obtain BP  monitor  & check BP at home  document, and provide log at future appointments -Counseled on diet and exercise extensively Recommended to continue current medication   Lab Results  Component Value Date   LDLCALC 96 10/08/2020    Hyperlipidemia/CAD/s/p CABG x 5: (LDL goal < 70) -Not ideally controlled -Current treatment: . Lovastatin 40 mg qd -Medications previously tried: NA  -Educated on Cholesterol goals;  Benefits of statin for ASCVD risk reduction; Importance of limiting foods high in cholesterol; Exercise goal of 150 minutes per week; -Counseled on diet and exercise extensively Recommended Change to high intensity statin given CAD and DM  Lab Results  Component Value Date   HGBA1C 7.9 (H) 12/27/2020   Last eGFr ~70 Diabetes (A1c goal <7%) -Uncontrolled -Current medications: Marland Kitchen Metformin 1000 mg bid  -Medications previously tried: NA--jardiance is cost prohibitive -Current home glucose readings . fasting glucose: Not checking -- no glucometer  -Denies hypoglycemic/hyperglycemic symptoms  -Educated on A1c and blood sugar goals; Complications of diabetes including kidney damage, retinal damage, and cardiovascular disease; Exercise goal of 150 minutes per week; Benefits of routine self-monitoring of blood sugar;  -Counseled to check feet daily and get yearly eye exams -Counseled on diet and exercise extensively Recommended patient discuss insurance options with medicare/disability specialist  for options that better suit his needs Collaborated with [employer] to determine glucometer coverage issues-- Patient to take Medicare A&B card to pharmacy for billing Assessed patient finances. Jardiance patient assistance application mailed to patient 02/04/21. He will complete and bring in.  Heart Failure (Goal: manage symptoms and prevent exacerbations) does not Follow with cardiology -Not ideally controlled -Last ejection fraction: 55-60% (Date: 10/21- unc  ed) -HF type: Diastolic -NYHA Class: I (no actitivty limitation) -AHA HF Stage: B (Heart disease present - no symptoms present) -Current treatment: . metoprolol tartrate 25 mg qd -Medications previously tried: unknown  -Current home BP/HR readings: not checking  -Educated on Importance of blood pressure control -Counseled on diet and exercise extensively Counseled on blood pressure monitoring Recommended obtain Reli On brand BP monitor at [facility]. recommend patient follow up with cardiology given his extensive history   Patient Goals/Self-Care Activities . Over the next 60 days, patient will:  - take medications as prescribed focus on medication adherence by pill box, calendar,  check glucose daily, document, and provide at future appointments collaborate with provider on medication access solutions target a minimum of 150 minutes of moderate intensity exercise weekly engage in dietary modifications by reducing sodium, fried and fast food  Follow Up Plan: Telephone follow up appointment with care management team member scheduled for: 30-60 day

## 2021-06-02 ENCOUNTER — Ambulatory Visit: Payer: Self-pay | Admitting: *Deleted

## 2021-06-02 ENCOUNTER — Other Ambulatory Visit: Payer: Self-pay

## 2021-06-02 ENCOUNTER — Ambulatory Visit (INDEPENDENT_AMBULATORY_CARE_PROVIDER_SITE_OTHER): Payer: Medicare HMO | Admitting: Nurse Practitioner

## 2021-06-02 ENCOUNTER — Encounter: Payer: Self-pay | Admitting: Nurse Practitioner

## 2021-06-02 DIAGNOSIS — J069 Acute upper respiratory infection, unspecified: Secondary | ICD-10-CM

## 2021-06-02 DIAGNOSIS — R0602 Shortness of breath: Secondary | ICD-10-CM

## 2021-06-02 DIAGNOSIS — R059 Cough, unspecified: Secondary | ICD-10-CM | POA: Diagnosis not present

## 2021-06-02 MED ORDER — HYDROCOD POLST-CPM POLST ER 10-8 MG/5ML PO SUER: 5.0000 mL | Freq: Two times a day (BID) | ORAL | 0 refills | Status: DC | PRN

## 2021-06-02 NOTE — Telephone Encounter (Signed)
Reason for Disposition  [1] Sore throat with cough/cold symptoms AND [2] present > 5 days  Answer Assessment - Initial Assessment Questions 1. ONSET: "When did the throat start hurting?" (Hours or days ago)      Started 5-6 days ago 2. SEVERITY: "How bad is the sore throat?" (Scale 1-10; mild, moderate or severe)   - MILD (1-3):  doesn't interfere with eating or normal activities   - MODERATE (4-7): interferes with eating some solids and normal activities   - SEVERE (8-10):  excruciating pain, interferes with most normal activities   - SEVERE DYSPHAGIA: can't swallow liquids, drooling     Moderate- burns when swallow 3. STREP EXPOSURE: "Has there been any exposure to strep within the past week?" If Yes, ask: "What type of contact occurred?"      unknown 4.  VIRAL SYMPTOMS: "Are there any symptoms of a cold, such as a runny nose, cough, hoarse voice or red eyes?"      Cough, sinus congestion  5. FEVER: "Do you have a fever?" If Yes, ask: "What is your temperature, how was it measured, and when did it start?"     No fever 6. PUS ON THE TONSILS: "Is there pus on the tonsils in the back of your throat?"     Unable to see 7. OTHER SYMPTOMS: "Do you have any other symptoms?" (e.g., difficulty breathing, headache, rash)     Some chest tightness, blood streaks in sputum today 8. PREGNANCY: "Is there any chance you are pregnant?" "When was your last menstrual period?"     N/a  Protocols used: Sore Throat-A-AH

## 2021-06-02 NOTE — Telephone Encounter (Signed)
Patient is calling to report he has sore throat for 4-5 days now- sinus drainage. No fever. Patient states his throat is painful and the sputum he coughed up today had blood streaks in it. Patient has not done COVID home test- does not have. Patient has been schedule with provider this afternoon.

## 2021-06-02 NOTE — Telephone Encounter (Signed)
FYI virtual scheduled today with Clydie Braun

## 2021-06-02 NOTE — Progress Notes (Signed)
There were no vitals taken for this visit.   Subjective:    Patient ID: Lucas Weiss, male    DOB: May 22, 1956, 65 y.o.   MRN: [id number]  HPI: Lucas Weiss is a 65 y.o. male  Chief Complaint  Patient presents with   Cough    Pt states he has been having a cough and spitting up blood for the past 5 days. States he has also had trouble swallowing. States every time he does swallow, it burns.    UPPER RESPIRATORY TRACT INFECTION Worst symptom: coughing up blood- states he has trouble swallowing.  Every time he swallows it burns. Fever: no Cough: yes Shortness of breath: yes Wheezing: yes Chest pain: no Chest tightness: yes Chest congestion: no Nasal congestion: yes Runny nose: yes Post nasal drip: yes Sneezing: no Sore throat: yes Swollen glands: no Sinus pressure: yes Headache: no Face pain: no Toothache: no Ear pain: yes left Ear pressure: no bilateral Eyes red/itching:no Eye drainage/crusting: no  Vomiting: no Rash: no Fatigue: yes Sick contacts: no Strep contacts: no  Context: stable Recurrent sinusitis: no Relief with OTC cold/cough medications: no  Treatments attempted: cold/sinus   Relevant past medical, surgical, family and social history reviewed and updated as indicated. Interim medical history since our last visit reviewed. Allergies and medications reviewed and updated.  Review of Systems  Constitutional:  Positive for fatigue. Negative for fever.  HENT:  Positive for congestion, ear pain, postnasal drip, rhinorrhea and sore throat. Negative for sinus pressure, sinus pain and sneezing.   Respiratory:  Positive for cough, chest tightness, shortness of breath and wheezing.   Gastrointestinal:  Negative for vomiting.  Skin:  Negative for rash.  Neurological:  Negative for headaches.   Per HPI unless specifically indicated above     Objective:    There were no vitals taken for this visit.  Wt Readings from Last 3 Encounters:  03/27/21 187 lb  (84.8 kg)  12/27/20 184 lb 12.8 oz (83.8 kg)  11/05/20 180 lb (81.6 kg)    Physical Exam Vitals and nursing note reviewed.  HENT:     Right Ear: Hearing normal.     Left Ear: Hearing normal.  Pulmonary:     Effort: Pulmonary effort is normal. No respiratory distress.  Neurological:     Mental Status: He is alert.  Psychiatric:        Mood and Affect: Mood normal.        Behavior: Behavior normal.        Thought Content: Thought content normal.        Judgment: Judgment normal.    Results for orders placed or performed in visit on 03/27/21  Bayer DCA Hb A1c Waived  Result Value Ref Range   HB A1C (BAYER DCA - WAIVED) 8.0 (H) <7.0 %      Assessment & Plan:   Problem List Items Addressed This Visit   None Visit Diagnoses     Upper respiratory tract infection, unspecified type    -  Primary   Tussinex ordered for cough. Chest xray ordered to evaluate SOB. COVID test ordered. Will make recommendations based on results. FU if symptoms do not improve.    Relevant Medications   chlorpheniramine-HYDROcodone (TUSSIONEX PENNKINETIC ER) 10-8 MG/5ML SUER   Other Relevant Orders   DG Chest 2 View   Novel Coronavirus, NAA ([facility])   Cough       Relevant Medications   chlorpheniramine-HYDROcodone (TUSSIONEX PENNKINETIC ER) 10-8 MG/5ML SUER  Other Relevant Orders   Novel Coronavirus, NAA ([facility])   Shortness of breath       Relevant Medications   chlorpheniramine-HYDROcodone (TUSSIONEX PENNKINETIC ER) 10-8 MG/5ML SUER        Follow up plan: Return if symptoms worsen or fail to improve.   This visit was completed via MyChart due to the restrictions of the COVID-19 pandemic. All issues as above were discussed and addressed. Physical exam was done as above through visual confirmation on MyChart. If it was felt that the patient should be evaluated in the office, they were directed there. The patient verbally consented to this visit. Location of the patient: Home Location of the  provider: Office Those involved with this call:  Provider: Larae Grooms, NP CMA: Tiffany Reel, CMA Front Desk/Registration: Harriet Pho Time spent on call: 15 minutes with patient via phone conference.  More than 50% of this time was spent in counseling and coordination of care. 21 minutes total spent in review of patient's record and preparation of their chart.

## 2021-06-03 DIAGNOSIS — J069 Acute upper respiratory infection, unspecified: Secondary | ICD-10-CM | POA: Diagnosis not present

## 2021-06-03 DIAGNOSIS — R059 Cough, unspecified: Secondary | ICD-10-CM | POA: Diagnosis not present

## 2021-06-03 NOTE — Addendum Note (Signed)
Addended byMortimer Fries on: 06/03/2021 05:10 PM   Modules accepted: Orders

## 2021-06-05 LAB — NOVEL CORONAVIRUS, NAA: SARS-CoV-2, NAA: DETECTED — AB

## 2021-06-05 LAB — SARS-COV-2, NAA 2 DAY TAT

## 2021-06-05 NOTE — Progress Notes (Signed)
Please let patient know that he is positive for COVID 19.  He is outside of the time frame to be treated with oral anti virals.  Because of this, I recommend he continue with symptomatic treatment at home.  Please let me know if he has any questions.

## 2021-06-19 ENCOUNTER — Telehealth: Payer: Self-pay | Admitting: Pharmacist

## 2021-06-19 NOTE — Chronic Care Management (AMB) (Signed)
    Chronic Care Management Pharmacy Assistant   Name: Lucas Weiss  MRN: [id number] DOB: February 02, 1956   Reason for Encounter: Chart Review    Medications: Outpatient Encounter Medications as of 06/19/2021  Medication Sig   amLODipine (NORVASC) 5 MG tablet Take 1 tablet (5 mg total) by mouth daily.   aspirin EC 81 MG tablet Take 81 mg by mouth daily.   Blood Glucose Monitoring Suppl (ONETOUCH VERIO) w/Device KIT To check blood sugar twice a day and document, bring to provider visits.   chlorpheniramine-HYDROcodone (TUSSIONEX PENNKINETIC ER) 10-8 MG/5ML SUER Take 5 mLs by mouth every 12 (twelve) hours as needed for cough.   empagliflozin (JARDIANCE) 25 MG TABS tablet Take 1 tablet (25 mg total) by mouth daily before breakfast.   gabapentin (NEURONTIN) 300 MG capsule Take 300 mg by mouth 3 (three) times daily.   glucose blood (ONETOUCH VERIO) test strip To check blood sugar twice a day and document, bring to provider visits.   Lancets (ONETOUCH ULTRASOFT) lancets To check blood sugar twice a day and document, bring to provider visits.   lisinopril (ZESTRIL) 40 MG tablet Take 1 tablet (40 mg total) by mouth daily.   lovastatin (MEVACOR) 20 MG tablet TAKE TWO TABLETS BY MOUTH EVERY NIGHT AT BEDTIME.   metFORMIN (GLUCOPHAGE) 500 MG tablet TAKE TWO TABLETS BY MOUTH TWICE A DAY WITH FOOD   metoprolol tartrate (LOPRESSOR) 50 MG tablet Take 1 tablet (50 mg total) by mouth daily.   nitroGLYCERIN (NITROSTAT) 0.4 MG SL tablet Place 1 tablet (0.4 mg total) under the tongue every 5 (five) minutes as needed for chest pain.   QUEtiapine (SEROQUEL) 25 MG tablet Take 1 tablet (25 mg total) by mouth at bedtime. (Patient not taking: No sig reported)   No facility-administered encounter medications on file as of 06/19/2021.   Reviewed chart for medication changes and adherence.  Recent OV, Consult or Hospital visit:  06/02/21 Jon Billings, NP Upper respiratory infection No medication changes  indicated  No gaps in adherence identified. Patient has follow up scheduled with pharmacy team. No further action required.  Lizbeth Bark Clinical Pharmacist Assistant [phone]

## 2021-06-26 ENCOUNTER — Ambulatory Visit: Payer: Medicare HMO | Admitting: Family Medicine

## 2021-07-08 ENCOUNTER — Encounter: Payer: Self-pay | Admitting: Family Medicine

## 2021-07-08 ENCOUNTER — Ambulatory Visit (INDEPENDENT_AMBULATORY_CARE_PROVIDER_SITE_OTHER): Payer: Medicare HMO | Admitting: Family Medicine

## 2021-07-08 ENCOUNTER — Other Ambulatory Visit: Payer: Self-pay

## 2021-07-08 VITALS — BP 132/64 | HR 74 | Temp 98.4°F | Ht 71.0 in | Wt 178.0 lb

## 2021-07-08 DIAGNOSIS — E1159 Type 2 diabetes mellitus with other circulatory complications: Secondary | ICD-10-CM | POA: Diagnosis not present

## 2021-07-08 DIAGNOSIS — E1169 Type 2 diabetes mellitus with other specified complication: Secondary | ICD-10-CM | POA: Diagnosis not present

## 2021-07-08 DIAGNOSIS — F332 Major depressive disorder, recurrent severe without psychotic features: Secondary | ICD-10-CM

## 2021-07-08 DIAGNOSIS — R079 Chest pain, unspecified: Secondary | ICD-10-CM

## 2021-07-08 DIAGNOSIS — E1165 Type 2 diabetes mellitus with hyperglycemia: Secondary | ICD-10-CM

## 2021-07-08 DIAGNOSIS — E785 Hyperlipidemia, unspecified: Secondary | ICD-10-CM | POA: Diagnosis not present

## 2021-07-08 DIAGNOSIS — I110 Hypertensive heart disease with heart failure: Secondary | ICD-10-CM | POA: Diagnosis not present

## 2021-07-08 DIAGNOSIS — I25119 Atherosclerotic heart disease of native coronary artery with unspecified angina pectoris: Secondary | ICD-10-CM | POA: Diagnosis not present

## 2021-07-08 DIAGNOSIS — I152 Hypertension secondary to endocrine disorders: Secondary | ICD-10-CM | POA: Diagnosis not present

## 2021-07-08 DIAGNOSIS — R69 Illness, unspecified: Secondary | ICD-10-CM | POA: Diagnosis not present

## 2021-07-08 LAB — BAYER DCA HB A1C WAIVED: HB A1C (BAYER DCA - WAIVED): 7.3 % — ABNORMAL HIGH (ref <7.0)

## 2021-07-08 NOTE — Progress Notes (Signed)
BP 132/64   Pulse 74   Temp 98.4 F (36.9 C) (Oral)   Ht 5\' 11"  (1.803 m)   Wt 178 lb (80.7 kg)   SpO2 97%   BMI 24.83 kg/m    Subjective:    Patient ID: Lucas Weiss, male    DOB: 1956/10/19, 65 y.o.   MRN: [id number]  HPI: Lucas Weiss is a 65 y.o. male  Chief Complaint  Patient presents with   Diabetes   Hypertension   Hyperlipidemia   DIABETES Hypoglycemic episodes:no Polydipsia/polyuria: no Visual disturbance: no Chest pain: no Paresthesias: yes Glucose Monitoring: yes  Accucheck frequency:  a few times  Fasting glucose: 200-300 Taking Insulin?: no Blood Pressure Monitoring: not checking Retinal Examination: Not up to Date Foot Exam: Up to Date Diabetic Education: Completed Pneumovax: Up to Date Influenza: Up to Date Aspirin: yes  HYPERTENSION / HYPERLIPIDEMIA Satisfied with current treatment? yes Duration of hypertension: chronic BP medication side effects: no Duration of hyperlipidemia: chronic Cholesterol medication side effects: no Cholesterol supplements: none Past cholesterol medications: lovastatin Medication compliance: excellent compliance Aspirin: yes Recent stressors: yes Recurrent headaches: no Visual changes: no Palpitations: no Dyspnea: no Chest pain: yes Lower extremity edema: no Dizzy/lightheaded: no  CHEST PAIN Duration:1-2 weeks Onset: sudden Quality: aching and sore Severity: moderate Location: left para substernal Radiation: back Episode duration: minutes Frequency: intermittent Related to exertion: unclear Activity when pain started: at random Trauma: no Anxiety/recent stressors: yes Status: fluctuating Treatments attempted: nothing  Current pain status: in pain Shortness of breath: yes Cough: yes, non-productive Nausea: no Diaphoresis: no Heartburn: no Palpitations: no    Relevant past medical, surgical, family and social history reviewed and updated as indicated. Interim medical history since our last  visit reviewed. Allergies and medications reviewed and updated.  Review of Systems  Constitutional: Negative.   HENT: Negative.    Respiratory:  Positive for chest tightness and shortness of breath. Negative for apnea, cough, choking, wheezing and stridor.   Cardiovascular:  Positive for chest pain. Negative for palpitations and leg swelling.  Gastrointestinal: Negative.   Musculoskeletal: Negative.   Neurological: Negative.   Psychiatric/Behavioral: Negative.     Per HPI unless specifically indicated above     Objective:    BP 132/64   Pulse 74   Temp 98.4 F (36.9 C) (Oral)   Ht 5\' 11"  (1.803 m)   Wt 178 lb (80.7 kg)   SpO2 97%   BMI 24.83 kg/m   Wt Readings from Last 3 Encounters:  07/08/21 178 lb (80.7 kg)  03/27/21 187 lb (84.8 kg)  12/27/20 184 lb 12.8 oz (83.8 kg)    Physical Exam Vitals and nursing note reviewed.  Constitutional:      General: He is not in acute distress.    Appearance: Normal appearance. He is not ill-appearing, toxic-appearing or diaphoretic.  HENT:     Head: Normocephalic and atraumatic.     Right Ear: External ear normal.     Left Ear: External ear normal.     Nose: Nose normal.     Mouth/Throat:     Mouth: Mucous membranes are moist.     Pharynx: Oropharynx is clear.  Eyes:     General: No scleral icterus.       Right eye: No discharge.        Left eye: No discharge.     Extraocular Movements: Extraocular movements intact.     Conjunctiva/sclera: Conjunctivae normal.     Pupils: Pupils are equal,  round, and reactive to light.  Cardiovascular:     Rate and Rhythm: Normal rate and regular rhythm.     Pulses: Normal pulses.     Heart sounds: Normal heart sounds. No murmur heard.   No friction rub. No gallop.  Pulmonary:     Effort: Pulmonary effort is normal. No respiratory distress.     Breath sounds: Normal breath sounds. No stridor. No wheezing, rhonchi or rales.  Chest:     Chest wall: No tenderness.  Musculoskeletal:         General: Normal range of motion.     Cervical back: Normal range of motion and neck supple.  Skin:    General: Skin is warm and dry.     Capillary Refill: Capillary refill takes less than 2 seconds.     Coloration: Skin is not jaundiced or pale.     Findings: No bruising, erythema, lesion or rash.  Neurological:     General: No focal deficit present.     Mental Status: He is alert and oriented to person, place, and time. Mental status is at baseline.  Psychiatric:        Mood and Affect: Mood normal.        Behavior: Behavior normal.        Thought Content: Thought content normal.        Judgment: Judgment normal.    Results for orders placed or performed in visit on 06/02/21  Novel Coronavirus, NAA ([facility])   Specimen: Nasopharyngeal(NP) swabs in vial transport medium   Nasopharynge  Result Value Ref Range   SARS-CoV-2, NAA Detected (A) Not Detected  SARS-COV-2, NAA 2 DAY TAT   Nasopharynge  Result Value Ref Range   SARS-CoV-2, NAA 2 DAY TAT Performed       Assessment & Plan:   Problem List Items Addressed This Visit       Cardiovascular and Mediastinum   CAD (coronary artery disease)    Will keep his BP and cholesterol and sugars under good control. Now with CP. Has not seen his cardiologist since 2017. Will get him back in. Continue to monitor.        Relevant Orders   Ambulatory referral to Cardiology   Hypertensive heart failure (HCC)    Euvolemic today. Has not seen his cardiologist since 2017. Will get him back in. Continue to monitor.        Relevant Orders   Ambulatory referral to Cardiology   Hypertension associated with diabetes (HCC)    Under good control on current regimen. Continue current regimen. Continue to monitor. Call with any concerns. Refills given. Labs drawn today.        Relevant Orders   CBC with Differential/Platelet   Comprehensive metabolic panel     Endocrine   Hyperlipidemia associated with type 2 diabetes mellitus (HCC)     Under good control on current regimen. Continue current regimen. Continue to monitor. Call with any concerns. Refills given. Labs drawn today.        Relevant Orders   CBC with Differential/Platelet   Comprehensive metabolic panel   Lipid Panel w/o Chol/HDL Ratio   Controlled type 2 diabetes mellitus (HCC) - Primary    Doing OK with A1c of 7.3- not taking his jardiance. Will reach out to [facility] pharmacy to see about help with his medicine.        Relevant Orders   Bayer DCA Hb A1c Waived   CBC with Differential/Platelet   Comprehensive metabolic panel  Other   Severe recurrent major depression (HCC)    Does not want medication. Does not want to discuss. Refuses PHQ9       Other Visit Diagnoses     Chest pain, unspecified type       Has not seen his cardiologist in quite a while. RBBB on EKG- will get him back into cardiology ASAP. Call with any concerns. Warning signs discussed to go to ER   Relevant Orders   EKG 12-Lead (Completed)   Ambulatory referral to Cardiology        Follow up plan: Return in about 3 months (around 10/08/2021) for physcial.

## 2021-07-08 NOTE — Assessment & Plan Note (Signed)
Under good control on current regimen. Continue current regimen. Continue to monitor. Call with any concerns. Refills given. Labs drawn today.   

## 2021-07-08 NOTE — Assessment & Plan Note (Signed)
Euvolemic today. Has not seen his cardiologist since 2017. Will get him back in. Continue to monitor.

## 2021-07-08 NOTE — Assessment & Plan Note (Signed)
Doing OK with A1c of 7.3- not taking his jardiance. Will reach out to [facility] pharmacy to see about help with his medicine.

## 2021-07-08 NOTE — Assessment & Plan Note (Signed)
Does not want medication. Does not want to discuss. Refuses PHQ9

## 2021-07-08 NOTE — Assessment & Plan Note (Signed)
Will keep his BP and cholesterol and sugars under good control. Now with CP. Has not seen his cardiologist since 2017. Will get him back in. Continue to monitor.

## 2021-07-09 ENCOUNTER — Encounter: Payer: Self-pay | Admitting: Family Medicine

## 2021-07-09 LAB — COMPREHENSIVE METABOLIC PANEL
ALT: 26 IU/L (ref 0–44)
AST: 22 IU/L (ref 0–40)
Albumin/Globulin Ratio: 1.8 (ref 1.2–2.2)
Albumin: 4.2 g/dL (ref 3.8–4.8)
Alkaline Phosphatase: 106 IU/L (ref 44–121)
BUN/Creatinine Ratio: 15 (ref 10–24)
BUN: 19 mg/dL (ref 8–27)
Bilirubin Total: 0.2 mg/dL (ref 0.0–1.2)
CO2: 18 mmol/L — ABNORMAL LOW (ref 20–29)
Calcium: 9.2 mg/dL (ref 8.6–10.2)
Chloride: 105 mmol/L (ref 96–106)
Creatinine, Ser: 1.24 mg/dL (ref 0.76–1.27)
Globulin, Total: 2.4 g/dL (ref 1.5–4.5)
Glucose: 249 mg/dL — ABNORMAL HIGH (ref 65–99)
Potassium: 4.6 mmol/L (ref 3.5–5.2)
Sodium: 139 mmol/L (ref 134–144)
Total Protein: 6.6 g/dL (ref 6.0–8.5)
eGFR: 65 mL/min/{1.73_m2} (ref >59)

## 2021-07-09 LAB — CBC WITH DIFFERENTIAL/PLATELET
Basophils Absolute: 0.1 10*3/uL (ref 0.0–0.2)
Basos: 1 %
EOS (ABSOLUTE): 0.3 10*3/uL (ref 0.0–0.4)
Eos: 5 %
Hematocrit: 35.4 % — ABNORMAL LOW (ref 37.5–51.0)
Hemoglobin: 11.8 g/dL — ABNORMAL LOW (ref 13.0–17.7)
Immature Grans (Abs): 0 10*3/uL (ref 0.0–0.1)
Immature Granulocytes: 0 %
Lymphocytes Absolute: 1.6 10*3/uL (ref 0.7–3.1)
Lymphs: 25 %
MCH: 29.3 pg (ref 26.6–33.0)
MCHC: 33.3 g/dL (ref 31.5–35.7)
MCV: 88 fL (ref 79–97)
Monocytes Absolute: 0.4 10*3/uL (ref 0.1–0.9)
Monocytes: 7 %
Neutrophils Absolute: 4.1 10*3/uL (ref 1.4–7.0)
Neutrophils: 62 %
Platelets: 198 10*3/uL (ref 150–450)
RBC: 4.03 x10E6/uL — ABNORMAL LOW (ref 4.14–5.80)
RDW: 15.8 % — ABNORMAL HIGH (ref 11.6–15.4)
WBC: 6.6 10*3/uL (ref 3.4–10.8)

## 2021-07-09 LAB — LIPID PANEL W/O CHOL/HDL RATIO
Cholesterol, Total: 177 mg/dL (ref 100–199)
HDL: 37 mg/dL — ABNORMAL LOW (ref >39)
LDL Chol Calc (NIH): 89 mg/dL (ref 0–99)
Triglycerides: 309 mg/dL — ABNORMAL HIGH (ref 0–149)
VLDL Cholesterol Cal: 51 mg/dL — ABNORMAL HIGH (ref 5–40)

## 2021-07-11 ENCOUNTER — Encounter: Payer: Self-pay | Admitting: Cardiovascular Disease

## 2021-07-11 ENCOUNTER — Ambulatory Visit (INDEPENDENT_AMBULATORY_CARE_PROVIDER_SITE_OTHER): Payer: Medicare HMO | Admitting: Cardiovascular Disease

## 2021-07-11 ENCOUNTER — Other Ambulatory Visit: Payer: Self-pay

## 2021-07-11 VITALS — BP 140/60 | HR 78 | Ht 71.0 in | Wt 178.0 lb

## 2021-07-11 DIAGNOSIS — R0602 Shortness of breath: Secondary | ICD-10-CM

## 2021-07-11 DIAGNOSIS — I739 Peripheral vascular disease, unspecified: Secondary | ICD-10-CM

## 2021-07-11 DIAGNOSIS — I110 Hypertensive heart disease with heart failure: Secondary | ICD-10-CM

## 2021-07-11 DIAGNOSIS — I70219 Atherosclerosis of native arteries of extremities with intermittent claudication, unspecified extremity: Secondary | ICD-10-CM

## 2021-07-11 DIAGNOSIS — I25119 Atherosclerotic heart disease of native coronary artery with unspecified angina pectoris: Secondary | ICD-10-CM

## 2021-07-11 DIAGNOSIS — E785 Hyperlipidemia, unspecified: Secondary | ICD-10-CM | POA: Diagnosis not present

## 2021-07-11 NOTE — Patient Instructions (Signed)
Medication Instructions:   Your physician recommends that you continue on your current medications as directed. Please refer to the Current Medication list given to you today.  *If you need a refill on your cardiac medications before your next appointment, please call your pharmacy*   Lab Work: None ordered If you have labs (blood work) drawn today and your tests are completely normal, you will receive your results only by: MyChart Message (if you have MyChart) OR A paper copy in the mail If you have any lab test that is abnormal or we need to change your treatment, we will call you to review the results.   Testing/Procedures:   Your physician has requested that you have an echocardiogram. Echocardiography is a painless test that uses sound waves to create images of your heart. It provides your doctor with information about the size and shape of your heart and how well your heart's chambers and valves are working. This procedure takes approximately one hour. There are no restrictions for this procedure.  2.   Your physician has requested that you have an ankle brachial index (ABI). During this test an ultrasound and blood pressure cuff are used to evaluate the arteries that supply the arms and legs with blood. Allow thirty minutes for this exam. There are no restrictions or special instructions.   3.    Your physician has requested that you have an abdominal / iliac aorta duplex. During this test, an ultrasound is used to evaluate the aorta. Allow 30 minutes for this exam. Do not eat after midnight the day before and avoid carbonated beverages    Follow-Up: At [facility], you and your health needs are our priority.  As part of our continuing mission to provide you with exceptional heart care, we have created designated Provider Care Teams.  These Care Teams include your primary Cardiologist (physician) and Advanced Practice Providers (APPs -  Physician Assistants and Nurse Practitioners)  who all work together to provide you with the care you need, when you need it.  We recommend signing up for the patient portal called "MyChart".  Sign up information is provided on this After Visit Summary.  MyChart is used to connect with patients for Virtual Visits (Telemedicine).  Patients are able to view lab/test results, encounter notes, upcoming appointments, etc.  Non-urgent messages can be sent to your provider as well.   To learn more about what you can do with MyChart, go to [email or web address].    Your next appointment:   Follow up after testing   The format for your next appointment:   In Person  Provider:   You may see Dr. Kirke Corin or one of the following Advanced Practice Providers on your designated Care Team:   Nicolasa Ducking, NP Eula Listen, PA-C Marisue Ivan, PA-C Cadence Fransico Michael, [place]   Other Instructions

## 2021-07-11 NOTE — Progress Notes (Signed)
Cardiology Office Note   Date:  07/11/2021   ID:  Lucas Weiss, Lucas Weiss September 26, 1956, MRN [id number]  PCP:  [name], DO  Cardiologist:   Kathlyn Sacramento, MD   Chief Complaint  Patient presents with   Other    Chest pain/HTN. Meds reviewed verbally with pt.      History of Present Illness: Lucas Weiss is a 65 y.o. male who was referred by Dr. Wynetta Emery for evaluation of chest pain and to establish cardiovascular..  He has known history of coronary artery disease status post CABG in 2009 for severe left main and three-vessel coronary artery disease, post CABG CVA, extensive peripheral arterial disease, essential hypertension and hyperlipidemia.  He is status post left hip replacement.  He quit smoking after CABG.  He has known history of peripheral arterial disease with previous left external iliac artery angioplasty, left SFA/popliteal artery angioplasty and Viabahn stent placement x2 to the left SFA and popliteal arteries in 2013.  The left SFA stent is occluded within 3 months.  In addition, he was found to have right SFA occlusion.  The patient underwent stenting of the left iliac system in September 2013.  Most recent Doppler studies in 2016 showed moderately reduced ABI bilaterally.   He had previous left SFA stents that subsequently occluded and has known chronic right SFA occlusion.  The patient recently had COVID 19 and has noticed increased shortness of breath and some episodes of chest pain.  The pain is described as aching that can happen at rest or with exertion.  In addition, he is limited by bilateral calf claudication.   Past Medical History:  Diagnosis Date   Alcohol abuse, in remission    Avascular necrosis of bone of left hip ([place]) 10/15   Total hip replacement recommended by ortho, vascular says it's OK   Avascular necrosis of bone of left hip (HCC)    Total hip replacement recommended by ortho, vascular says he can have it done.     Bleeding hemorrhoids     Broken ribs 09/27/2020   CHF (congestive heart failure) (HCC)    Chronic low back pain    Complication of anesthesia    "woke up twice during procedures @ [facility]" (09/15/2012)   Coronary artery disease    Diabetes mellitus without complication ([place]) 04/14/94   A1c 6.8   Diverticulosis of sigmoid colon    DVT (deep venous thrombosis) (HCC)    Dyslexia    H/O hiatal hernia    Head injury, acute, with loss of consciousness ([place])    as a child   High cholesterol    Hypertension    Impaired fasting glucose    Last A1c 6.1   Myocardial infarction [facility])    Neuropathy    PAD (peripheral artery disease) ([place])    07/2012:  right ABI 0.64, left ABI 0.57.   Pneumonia    history of    Primary osteoarthritis of left hip    Renal artery stenosis (HCC)    Bilateral    Severe recurrent major depression without psychotic features ([place])    Shortness of breath dyspnea    with exertion - due to pain   Stroke [facility]) 2009   "after CABG; ~ blind left eye since" (09/15/2012)- memory loss    Tobacco abuse    Quit in 2009    Past Surgical History:  Procedure Laterality Date   2D Echo  02/17/11   EF 50-55%, mild LVH, No WMA,  mild MR, mild TR   CARDIAC CATHETERIZATION  ?2009   CARDIOVASCULAR STRESS TEST  08/10/12   Lexiscan: EF 70%, Attenuation artifact in inferior region.  No ischemia or infarct in remaining myocardium. Low risk   COLONOSCOPY W/ POLYPECTOMY     "no cancer"   CORONARY ARTERY BYPASS GRAFT  2009   2009, CABG X5, LIMA to LAD, SVG to Diagnonal, Sequential SVG to OM 2-3, SVG to PDA; Dr. Cyndia Bent   INSERTION OF ILIAC STENT Left 09/15/2012   Procedure: INSERTION OF ILIAC STENT;  Surgeon: Lorretta Harp, MD;  Location: [facility] CATH LAB;  Service: Cardiovascular;  Laterality: Left;  lt iliac stent   LOWER EXTREMITY ANGIOGRAM N/A 09/15/2012   Procedure: LOWER EXTREMITY ANGIOGRAM;  Surgeon: Lorretta Harp, MD;  Location: [facility] CATH LAB;  Service: Cardiovascular;  Laterality: N/A;   PERIPHERAL  ARTERIAL STENT GRAFT Left 06/20/2012; ~ 07/2012; 09/15/2012   left; left; left   PV angiogram  09/15/12   restenting of left common iliac for ISRS.  Bilateral occluded SFAs.  Renal artery stenosis: 70% right inferior renal artery, 90% left inferior renal artery stenosis.    TOTAL HIP ARTHROPLASTY Left 05/19/2016   Procedure: LEFT TOTAL HIP ARTHROPLASTY ANTERIOR APPROACH;  Surgeon: [name], MD;  Location: [facility];  Service: Orthopedics;  Laterality: Left;     Current Outpatient Medications  Medication Sig Dispense Refill   amLODipine (NORVASC) 5 MG tablet Take 1 tablet (5 mg total) by mouth daily. 90 tablet 1   aspirin EC 81 MG tablet Take 81 mg by mouth daily.     Blood Glucose Monitoring Suppl (ONETOUCH VERIO) w/Device KIT To check blood sugar twice a day and document, bring to provider visits. 1 kit 0   gabapentin (NEURONTIN) 300 MG capsule Take 300 mg by mouth 3 (three) times daily.     glucose blood (ONETOUCH VERIO) test strip To check blood sugar twice a day and document, bring to provider visits. 100 each 12   Lancets (ONETOUCH ULTRASOFT) lancets To check blood sugar twice a day and document, bring to provider visits. 100 each 12   lisinopril (ZESTRIL) 40 MG tablet Take 1 tablet (40 mg total) by mouth daily. 90 tablet 1   lovastatin (MEVACOR) 20 MG tablet TAKE TWO TABLETS BY MOUTH EVERY NIGHT AT BEDTIME. 180 tablet 1   metFORMIN (GLUCOPHAGE) 500 MG tablet TAKE TWO TABLETS BY MOUTH TWICE A DAY WITH FOOD 360 tablet 1   metoprolol tartrate (LOPRESSOR) 50 MG tablet Take 1 tablet (50 mg total) by mouth daily. 90 tablet 1   nitroGLYCERIN (NITROSTAT) 0.4 MG SL tablet Place 1 tablet (0.4 mg total) under the tongue every 5 (five) minutes as needed for chest pain. 30 tablet 1   empagliflozin (JARDIANCE) 25 MG TABS tablet Take 1 tablet (25 mg total) by mouth daily before breakfast. (Patient not taking: Reported on 07/11/2021) 90 tablet 1   QUEtiapine (SEROQUEL) 25 MG tablet Take 1 tablet (25  mg total) by mouth at bedtime. (Patient not taking: No sig reported) 30 tablet 1   No current facility-administered medications for this visit.    Allergies:   Patient has no known allergies.    Social History:  The patient  reports that he quit smoking about 12 years ago. His smoking use included cigarettes. He has a 45.00 pack-year smoking history. He has never used smokeless tobacco. He reports that he does not drink alcohol and does not use drugs.   Family History:  The  patient's family history includes Alcohol abuse in his father; Cancer in his sister; Diabetes in his brother, mother, and sister; Heart attack in his brother, brother, father, and mother; Heart disease in his brother, brother, father, mother, and sister; Hyperlipidemia in his brother, brother, father, mother, sister, and sister; Hypertension in his brother, brother, father, mother, sister, and sister; Lung disease in his father; Stroke in his brother; Thyroid disease in his mother.    ROS:  Please see the history of present illness.   Otherwise, review of systems are positive for none.   All other systems are reviewed and negative.    PHYSICAL EXAM: VS:  BP 140/60 (BP Location: Right Arm, Patient Position: Sitting, Cuff Size: Normal)   Pulse 78   Ht '5\' 11"'  (1.803 m)   Wt 178 lb (80.7 kg)   SpO2 98%   BMI 24.83 kg/m  , BMI Body mass index is 24.83 kg/m. GEN: Well nourished, well developed, in no acute distress  HEENT: normal  Neck: no JVD, carotid bruits, or masses Cardiac: RRR; no murmurs, rubs, or gallops,no edema  Respiratory:  clear to auscultation bilaterally, normal work of breathing GI: soft, nontender, nondistended, + BS MS: no deformity or atrophy  Skin: warm and dry, no rash Neuro:  Strength and sensation are intact Psych: euthymic mood, full affect Vascular: Femoral pulses +1 bilaterally.  Distal pulses are not palpable.   EKG:  EKG is ordered today. The ekg ordered today demonstrates normal sinus  rhythm with right bundle branch block.   Recent Labs: 10/08/2020: TSH 1.440 07/08/2021: ALT 26; BUN 19; Creatinine, Ser 1.24; Hemoglobin 11.8; Platelets 198; Potassium 4.6; Sodium 139    Lipid Panel    Component Value Date/Time   CHOL 177 07/08/2021 1404   TRIG 309 (H) 07/08/2021 1404   HDL 37 (L) 07/08/2021 1404   CHOLHDL 6.3 07/17/2011 0715   VLDL 48 (H) 07/17/2011 0715   LDLCALC 89 07/08/2021 1404      Wt Readings from Last 3 Encounters:  07/11/21 178 lb (80.7 kg)  07/08/21 178 lb (80.7 kg)  03/27/21 187 lb (84.8 kg)      PAD Screen 07/11/2021  Previous PAD dx? Yes  Previous surgical procedure? Yes  Pain with walking? No  Feet/toe relief with dangling? No  Painful, non-healing ulcers? No  Extremities discolored? No      ASSESSMENT AND PLAN:  1.  Coronary artery disease involving native coronary arteries with other forms of angina: The patient describes some recent episodes of exertional chest pain and some atypical component as well.  The initial plan was to proceed with a Lexiscan Myoview.  However, after reviewing his most recent study, it appears that he had severe reaction to [facility] with significant hypotension that required reversal with aminophylline.  Thus, we will hold off for now.  Given his dyspnea, I am going to get an echocardiogram to evaluate ejection fraction and wall motion.  His symptoms do not seem to be severe enough at this point to proceed with cardiac catheterization but that will be reevaluated upon follow-up.  2.  Peripheral arterial disease: Status post left iliac artery stent and known occluded SFA bilaterally.  He describes bilateral calf claudication.  I requested an ABI and aortoiliac duplex.  3.  Essential hypertension: Blood pressure is controlled.  4.  Hyperlipidemia: Currently on lovastatin.  I reviewed most recent lipid profile which showed an LDL of 89.  Given his extensive cardiovascular history, we should consider a more potent  statin.    Disposition:   FU with me after testing.  Signed,  Kathlyn Sacramento, MD  07/11/2021 2:40 PM    [place]

## 2021-07-14 NOTE — Progress Notes (Signed)
Interpreted by me on 07/08/21. RBBB at 72bpm

## 2021-08-27 ENCOUNTER — Telehealth: Payer: Self-pay

## 2021-08-27 ENCOUNTER — Telehealth: Payer: Medicare HMO | Admitting: General Practice

## 2021-08-27 ENCOUNTER — Ambulatory Visit (INDEPENDENT_AMBULATORY_CARE_PROVIDER_SITE_OTHER): Payer: Medicare HMO

## 2021-08-27 DIAGNOSIS — E782 Mixed hyperlipidemia: Secondary | ICD-10-CM

## 2021-08-27 DIAGNOSIS — I25119 Atherosclerotic heart disease of native coronary artery with unspecified angina pectoris: Secondary | ICD-10-CM

## 2021-08-27 DIAGNOSIS — E1159 Type 2 diabetes mellitus with other circulatory complications: Secondary | ICD-10-CM

## 2021-08-27 DIAGNOSIS — I152 Hypertension secondary to endocrine disorders: Secondary | ICD-10-CM

## 2021-08-27 DIAGNOSIS — E1169 Type 2 diabetes mellitus with other specified complication: Secondary | ICD-10-CM

## 2021-08-27 DIAGNOSIS — E119 Type 2 diabetes mellitus without complications: Secondary | ICD-10-CM

## 2021-08-27 NOTE — Patient Instructions (Signed)
Visit Information  PATIENT GOALS:  Goals Addressed             This Visit's Progress    RNCM: Monitor and Manage My Blood Sugar-Diabetes Type 2       Timeframe:  Long-Range Goal Priority:  High Start Date:     01-22-2021                        Expected End Date:    9-7--2023                 Follow Up Date 10-21-2021 05/21/2021 patient now has a glucose meter   - check blood sugar at prescribed times - check blood sugar before and after exercise - check blood sugar if I feel it is too high or too low    Why is this important?   Checking your blood sugar at home helps to keep it from getting very high or very low.  Writing the results in a diary or log helps the doctor know how to care for you.  Your blood sugar log should have the time, date and the results.  Also, write down the amount of insulin or other medicine that you take.  Other information, like what you ate, exercise done and how you were feeling, will also be helpful.     Notes: The patient is not checking his blood sugars. The patient does not have a meter. States he can not afford 130.00. Pharmacy consult to help with cost constraints. 05/21/2021: Patient received a glucose meter and is now checking his blood sugars once a week (usually in the evenings) with range in the lower 300 (993-716).  Patient checked blood glucose during contact with result 184.  Patient not taking Jardiance due to inability to purchase because of how expensive medication is. Patient asking for different medication for diabetes that he can afford. 08-27-2021: The patient is still not taking Jardiance because he cannot afford it. Will re-refer pharm D as former pharmacist is no longer working on the [facility] team. The patient states that his blood sugars have been 300 and higher. Last hemoglobin A1C was 7.3 in July. The patient did not understand what this was. Education and support given. Will continue to monitor and collaborate with pcp.         The patient  verbalized understanding of instructions, educational materials, and care plan provided today and declined offer to receive copy of patient instructions, educational materials, and care plan.   Telephone follow up appointment with care management team member scheduled for: 10-21-2021 at 1145 am  Alto Denver RN, MSN, CCM Community Care Coordinator [place]  [facility] Mobile: [phone]

## 2021-08-27 NOTE — Chronic Care Management (AMB) (Signed)
Chronic Care Management   CCM RN Visit Note  08/27/2021 Name: Lucas Weiss MRN: [id number] DOB: 09/07/1956  Subjective: Lucas Weiss is a 65 y.o. year old male who is a primary care patient of Lucas Roys, DO. The care management team was consulted for assistance with disease management and care coordination needs.    Engaged with patient by telephone for follow up visit in response to provider referral for case management and/or care coordination services.   Consent to Services:  The patient was given information about Chronic Care Management services, agreed to services, and gave verbal consent prior to initiation of services.  Please see initial visit note for detailed documentation.   Patient agreed to services and verbal consent obtained.   Assessment: Review of patient past medical history, allergies, medications, health status, including review of consultants reports, laboratory and other test data, was performed as part of comprehensive evaluation and provision of chronic care management services.   SDOH (Social Determinants of Health) assessments and interventions performed:  SDOH Interventions    Flowsheet Row Most Recent Value  SDOH Interventions   Physical Activity Interventions Other (Comments)  [no structured activity]        CCM Care Plan  No Known Allergies  Outpatient Encounter Medications as of 08/27/2021  Medication Sig   amLODipine (NORVASC) 5 MG tablet Take 1 tablet (5 mg total) by mouth daily.   aspirin EC 81 MG tablet Take 81 mg by mouth daily.   Blood Glucose Monitoring Suppl (ONETOUCH VERIO) w/Device KIT To check blood sugar twice a day and document, bring to provider visits.   empagliflozin (JARDIANCE) 25 MG TABS tablet Take 1 tablet (25 mg total) by mouth daily before breakfast. (Patient not taking: Reported on 07/11/2021)   gabapentin (NEURONTIN) 300 MG capsule Take 300 mg by mouth 3 (three) times daily.   glucose blood (ONETOUCH VERIO) test strip  To check blood sugar twice a day and document, bring to provider visits.   Lancets (ONETOUCH ULTRASOFT) lancets To check blood sugar twice a day and document, bring to provider visits.   lisinopril (ZESTRIL) 40 MG tablet Take 1 tablet (40 mg total) by mouth daily.   lovastatin (MEVACOR) 20 MG tablet TAKE TWO TABLETS BY MOUTH EVERY NIGHT AT BEDTIME.   metFORMIN (GLUCOPHAGE) 500 MG tablet TAKE TWO TABLETS BY MOUTH TWICE A DAY WITH FOOD   metoprolol tartrate (LOPRESSOR) 50 MG tablet Take 1 tablet (50 mg total) by mouth daily.   nitroGLYCERIN (NITROSTAT) 0.4 MG SL tablet Place 1 tablet (0.4 mg total) under the tongue every 5 (five) minutes as needed for chest pain.   QUEtiapine (SEROQUEL) 25 MG tablet Take 1 tablet (25 mg total) by mouth at bedtime. (Patient not taking: No sig reported)   No facility-administered encounter medications on file as of 08/27/2021.    Patient Active Problem List   Diagnosis Date Noted   Multiple rib fractures 10/08/2020   Insomnia 05/06/2017   History of left hip replacement 05/19/2016   Controlled type 2 diabetes mellitus ([place]) 01/09/2016   Tobacco abuse, in remission 01/09/2016   Severe recurrent major depression ([place]) 06/14/2015   Chronic pain 06/14/2015   Alcohol abuse, in remission    Atherosclerotic PVD with intermittent claudication ([place]) 10/25/2014   Hypertension associated with diabetes ([place]) 09/17/2014   Renal artery stenosis, native, bilateral ([place]) 02/20/2013   CAD (coronary artery disease) 09/16/2012   PAD (peripheral artery disease): Left common iliac stent plus additional stent(09/15/12) for ISRS. 09/16/2012  S/P CABG x 5: 2009: LIMA to LAD, SVG to Diag, Seq SVG to OM2-3, SVG to PDA 09/16/2012   Hypertensive heart failure ([place]) 09/16/2012   Hyperlipidemia associated with type 2 diabetes mellitus ([place]) 09/16/2012    Conditions to be addressed/monitored:CAD, HTN, HLD, DMII, and history of CVA  Care Plan : RNCMN: Diabetes Type 2 (Adult)  Updates  made by Vanita Ingles, RN since 08/27/2021 12:00 AM     Problem: RNCM: Glycemic Management (Diabetes, Type 2)   Priority: High     Long-Range Goal: RNCM: Glycemic Management Optimized   Start Date: 01/22/2021  Expected End Date: 08/27/2022  This Visit's Progress: Not on track  Priority: High  Note:   .Objective:  Lab Results  Component Value Date   HGBA1C 7.3 (H) 07/08/2021     Lab Results  Component Value Date   CREATININE 1.24 07/08/2021   CREATININE 1.12 10/08/2020   CREATININE 1.07 02/29/2020    No results found for: EGFR Current Barriers:  Knowledge Deficits related to basic Diabetes pathophysiology and self care/management Knowledge Deficits related to medications used for management of diabetes Difficulty obtaining or cannot afford medications Does not have glucometer to monitor blood sugar Financial Constraints Cognitive Deficits Limited Social Support Unable to independently manage DM Unable to self administer medications as prescribed Does not adhere to provider recommendations re: checking blood sugars and medications as prescribed  Does not adhere to prescribed medication regimen Lacks social connections Does not maintain contact with provider office Does not contact provider office for questions/concerns  Case Manager Clinical Goal(s):  patient will demonstrate improved adherence to prescribed treatment plan for diabetes self care/management as evidenced by: daily monitoring and recording of CBG  adherence to ADA/ carb modified diet exercise 3/4 days/week adherence to prescribed medication regimen Interventions:  Collaboration with Lucas Roys, DO regarding development and update of comprehensive plan of care as evidenced by provider attestation and co-signature Inter-disciplinary care team collaboration (see longitudinal plan of care) Provided education to patient about basic DM disease process. 05/21/2021 Reviewed education on S&S of hypoglycemia since  patient has not been eating well over the last 5 days due to a GI issue: diarrhea with blood present and black stools.  Symptoms have relieved today and no further diarrhea or black stools.  Instructed patient to contact CFP office if GI symptoms return. Reviewed medications with patient and discussed importance of medication adherence.  05/21/2021 Patient not able to afford Jardiance and has not started on it. Patient asking for different medication for diabetes that he can afford. 08-27-2021: The patient still not taking jardiance due to cost constraints. The patient states he is taking Metformin. Will re-refer the patient to pharm D for assistance.  Discussed plans with patient for ongoing care management follow up and provided patient with direct contact information for care management team Provided patient with written educational materials related to hypo and hyperglycemia and importance of correct treatment Reviewed scheduled/upcoming provider appointments including: 10-09-2021 at 3 pm Advised patient, providing education and rationale, to check cbg daily  and record, calling pcp for findings outside established parameters.  08-27-2021: The patient is not checking his blood sugars every day but usually when he does it is afternoon or night. He states his readings are in the 300's and sometimes well over 300's. Denies any lows. The patient educated on fasting blood sugar goal of <130 and post prandial of <180.  The patient states he just can't afford Jardiance. Will collaborate with the pcp  and pharm D.  Patient did obtain a glucose meter and is now checking his blood sugars once a week (usually in the evenings) with range in the lower 300 (328-329).  Patient checked blood glucose during contact with result 184.  Patient not taking Jardiance due to inability to purchase because of how expensive medication is. Patient asking for different medication for diabetes that he can afford. Patient is awaiting pharmacy  contact to help with possibly obtaining Jardiance. 08-27-2021: Currently is still taking Metformin but not Jardiance. Would consider a cheaper alternatives.  Review of patient status, including review of consultants reports, relevant laboratory and other test results, and medications completed. Patient Goals/Self-Care Activities  patient will:  - UNABLE to independently manage DM Self administers oral medications as prescribed Attends all scheduled provider appointments Checks blood sugars as prescribed and utilize hyper and hypoglycemia protocol as needed Adheres to prescribed ADA/carb modified- currently is not following a restricted diet. Discussed referral for care guides but the patient refuses at this time. Encouraged compliance  - barriers to adherence to treatment plan identified - blood glucose monitoring encouraged - resources required to improve adherence to care identified - self-awareness of signs/symptoms of hypo or hyperglycemia encouraged  Follow Up Plan: Telephone follow up appointment with care management team member scheduled for:  10-21-2021 at 11:45am    Task: RNCM: Alleviate Barriers to Glycemic Management Completed 08/27/2021  Outcome: Positive  Note:   Care Management Activities:    - barriers to adherence to treatment plan identified - blood glucose monitoring encouraged - resources required to improve adherence to care identified - self-awareness of signs/symptoms of hypo or hyperglycemia encouraged    Notes: the patient currently does not have a glucose meter. Working the [employer] D for assistance with meter.     Care Plan : RNCM: Hypertension (Adult)  Updates made by Vanita Ingles, RN since 08/27/2021 12:00 AM     Problem: RNCM: Hypertension (Hypertension)   Priority: Medium     Long-Range Goal: RNCM: Hypertension Monitored   Start Date: 01/22/2021  Expected End Date: 06/09/2022  This Visit's Progress: On track  Priority: Medium  Note:   Objective:  Last  practice recorded BP readings:  BP Readings from Last 3 Encounters:  07/11/21 140/60  07/08/21 132/64  03/27/21 (!) 159/89    Most recent eGFR/CrCl: No results found for: EGFR  No components found for: CRCL Current Barriers:  Knowledge Deficits related to basic understanding of hypertension pathophysiology and self care management Knowledge Deficits related to understanding of medications prescribed for management of hypertension Difficulty obtaining medications Non-adherence to prescribed medication regimen Cognitive Deficits Unable to independently manage HTN Lacks social connections Does not contact provider office for questions/concerns Case Manager Clinical Goal(s):   patient will verbalize understanding of plan for hypertension management  patient will attend all scheduled medical appointments: 10-09-2021 at 3 pm  patient will demonstrate improved adherence to prescribed treatment plan for hypertension as evidenced by taking all medications as prescribed, monitoring and recording blood pressure as directed, adhering to low sodium/DASH diet  patient will demonstrate improved health management independence as evidenced by checking blood pressure as directed and notifying PCP if SBP>160 or DBP > 90, taking all medications as prescribe, and adhering to a low sodium diet as discussed.  patient will verbalize basic understanding of hypertension disease process and self health management plan as evidenced by compliance with heart healthy diet, compliance with medications, working with the CCM team to manage health and well being.  Interventions:  Collaboration with Lucas Roys, DO regarding development and update of comprehensive plan of care as evidenced by provider attestation and co-signature Inter-disciplinary care team collaboration (see longitudinal plan of care) Evaluation of current treatment plan related to hypertension self management and patient's adherence to plan as  established by provider. 05/21/2021: Patient not consistent with monitoring blood pressure.  Patient checks blood pressure about once a month with systolic number around 852/?.  Patient states he can tell when his blood pressure is elevated or low. 08-27-2021: The patient is doing well with management of blood pressures. Did follow up with the cardiologist after complaint of CP. The patient states the cardiologist wanted him to have an ECHO but he cancelled it. He denies any further episodes of CP or discomfort. Feels the CP came from when he had COVID. Review of what worse looks like and when to seek emergent care.  Provided education to patient re: stroke prevention, s/s of heart attack and stroke, DASH diet, complications of uncontrolled blood pressure.  08-27-2021 Reviewed this education with patient. Reviewed medications with patient and discussed importance of compliance. 08-27-2021: The patient states compliance with medications for HTN management  Discussed plans with patient for ongoing care management follow up and provided patient with direct contact information for care management team Advised patient, providing education and rationale, to monitor blood pressure daily and record, calling PCP for findings outside established parameters.  Reviewed scheduled/upcoming provider appointments including: 10-09-2021 at 3 pm Patient Goals/Self-Care Activities  patient will:  - Self administers medications as prescribed Attends all scheduled provider appointments Calls provider office for new concerns, questions, or BP outside discussed parameters Checks BP and records as discussed Follows a low sodium diet/DASH diet - depression screen reviewed - home or ambulatory blood pressure monitoring encouraged Follow Up Plan: Telephone follow up appointment with care management team member scheduled for: 10-21-2021 at 11:45 am    Task: RNCM: Identify and Monitor Blood Pressure Elevation Completed 08/27/2021   Outcome: Positive  Note:   Care Management Activities:    - depression screen reviewed - home or ambulatory blood pressure monitoring encouraged        Care Plan : RNCM: Coronary Artery Disease (Adult) and HLD  Updates made by Vanita Ingles, RN since 08/27/2021 12:00 AM     Problem: RNCM: Disease Progression (Coronary Artery Disease) and HLD   Priority: Medium     Long-Range Goal: RNCM: Disease Progression Prevented or Minimizeda; CAD and HLD   Start Date: 01/22/2021  Expected End Date: 06/09/2022  This Visit's Progress: On track  Priority: Medium  Note:   Current Barriers:  Poorly controlled hyperlipidemia, complicated by non-compliance with dietary restrictions, cost constraints, chronic conditions  Current antihyperlipidemic regimen: Lovastatin 20 mg QD Most recent lipid panel:  Lab Results  Component Value Date   CHOL 177 07/08/2021   CHOL 179 10/08/2020   CHOL 201 (H) 09/04/2019   Lab Results  Component Value Date   HDL 37 (L) 07/08/2021   HDL 38 (L) 10/08/2020   HDL 37 (L) 09/04/2019   Lab Results  Component Value Date   LDLCALC 89 07/08/2021   LDLCALC 96 10/08/2020   LDLCALC 100 (H) 09/04/2019   Lab Results  Component Value Date   TRIG 309 (H) 07/08/2021   TRIG 269 (H) 10/08/2020   TRIG 377 (H) 09/04/2019   Lab Results  Component Value Date   CHOLHDL 6.3 07/17/2011   CHOLHDL 6.7 07/19/2008   No results found for:  LDLDIRECT  ASCVD risk enhancing conditions: age 83, DM, HTN, past history of stroke, former smoker Unable to self administer medications as prescribed Does not adhere to prescribed medication regimen Lacks social connections Does not contact provider office for questions/concerns  RN Care Manager Clinical Goal(s):   patient will work with Consulting civil engineer, providers, and care team towards execution of optimized self-health management plan  patient will verbalize understanding of plan for effective management of HLD and CAD  patient will  work with [facility], CCM team and pcp to address needs related to HLD and CAD  patient will attend all scheduled medical appointments: 10-09-2021  Interventions: Collaboration with Lucas Roys, DO regarding development and update of comprehensive plan of care as evidenced by provider attestation and co-signature Inter-disciplinary care team collaboration (see longitudinal plan of care) Medication review performed; medication list updated in electronic medical record.  Inter-disciplinary care team collaboration (see longitudinal plan of care) Referred to pharmacy team for assistance with HLD medication management Evaluation of current treatment plan related to HLD and CAD and patient's adherence to plan as established by provider. 08-27-2021: The patient feels he is doing better since getting COVID. States he is eating good. Is a little stressed because he is going to have to move. Someone bought the property his trailer is on and he is looking for someone to move his mobile home. He has 3 weeks to move it. He decline care guide referral.  The patient has the [facility] contact information to call if he changes his mind. Will continue to monitor for changes or new needs.  Advised patient to call the office for changes in condition or question  Provided education to patient re: benefits of heart health diet and following recommendations of the provider. 08-27-2021 Discussed with patient importance of heart healthy diet.  Patient states it is hard to get good healthy foods due to the cost of them. Reviewed scheduled/upcoming provider appointments including: 10-09-2021 Discussed plans with patient for ongoing care management follow up and provided patient with direct contact information for care management team Patient Goals/Self-Care Activities: patient will:   - call for medicine refill 2 or 3 days before it runs out - call if I am sick and can't take my medicine - keep a list of all the medicines I take;  vitamins and herbals too - learn to read medicine labels - use a pillbox to sort medicine - use an alarm clock or phone to remind me to take my medicine - change to whole grain breads, cereal, pasta - drink 6 to 8 glasses of water each day - eat 3 to 5 servings of fruits and vegetables each day - eat 5 or 6 small meals each day - fill half the plate with nonstarchy vegetables - keep a food diary - limit fast food meals to no more than 1 per week - manage portion size - prepare main meal at home 3 to 5 days each week - read food labels for fat, fiber, carbohydrates and portion size - be open to making changes - I can manage, know and watch for signs of a heart attack - if I have chest pain, call for help - learn about small changes that will make a big difference - learn my personal risk factors  - barriers to treatment adherence reviewed and addressed - difficulty of making life-long changes acknowledged - functional limitation screening reviewed - healthy lifestyle promoted - individualized medical nutrition therapy provided - medication-adherence assessment completed -  response to pharmacologic therapy monitored - self-awareness of signs/symptoms of worsening disease encouraged  Follow Up Plan: Telephone follow up appointment with care management team member scheduled for: 10-21-2021 at 11:45am      Task: RNCM: Alleviate Barriers to Coronary Artery Disease Therapy Completed 08/27/2021  Outcome: Positive  Note:   Care Management Activities:    - barriers to treatment adherence reviewed and addressed - difficulty of making life-long changes acknowledged - functional limitation screening reviewed - healthy lifestyle promoted - individualized medical nutrition therapy provided - medication-adherence assessment completed - response to pharmacologic therapy monitored - self-awareness of signs/symptoms of worsening disease encouraged        Care Plan : RNCM: Stroke (Adult)   Updates made by Vanita Ingles, RN since 08/27/2021 12:00 AM     Problem: RNCM: Emotional Adjustment to Disease (Stroke)   Priority: Medium     Long-Range Goal: RNCM: Optimal Coping   Start Date: 01/22/2021  Expected End Date: 08/09/2022  This Visit's Progress: On track  Priority: Medium  Note:   Current Barriers:  Knowledge Deficits related to resources to help with expressed needs for patient with chronic conditions and post residual stroke management  Chronic Disease Management support and education needs related to past history of stroke  Financial Constraints.  Cognitive Deficits Unable to self administer medications as prescribed Does not adhere to provider recommendations re: dietary restrictions  Does not adhere to prescribed medication regimen Lacks social connections Does not contact provider office for questions/concerns  Nurse Case Manager Clinical Goal(s):   patient will verbalize understanding of plan for effective management of post residual stroke  patient will work with [facility], CCM team, and pcp to address needs related to meeting patients needs due to cognitive concerns and changes in condition   patient will attend all scheduled medical appointments: 03-27-2021  Interventions:  1:1 collaboration with Lucas Roys, DO regarding development and update of comprehensive plan of care as evidenced by provider attestation and co-signature Inter-disciplinary care team collaboration (see longitudinal plan of care) Evaluation of current treatment plan related to post residual stroke management  and patient's adherence to plan as established by provider. 08-27-2021: the patient states that he still has issues with his memory since having his stroke but he is doing the best he can. Has been through a lot with losing his wife, home, businesses, and livelihood.  States that he is going to have to move because the property was sold. He is trying to find someone now that can move his  mobile home to a place on [address].  He states he is doing okay but has had a rough time. Declined getting a new ECHO after his CP resolved and he did not feel the need to  have it any longer.  He is managing the best he can. His sister tries to help him with insurance and things like that but it actually was worse than what he had. He doesn't want her to be upset with him so he has kept it. The granddaughter is a good support system for him.  Denies any acute needs at this time. .  Advised patient to call the office for changes in condition or new questions  Provided education to patient re: following plan of care, adherence to a heart healthy/ADA diet, safety, and compliance with medications. 08-27-2021: Reviewed with the patient dietary restrictions, compliance with medications and working with the CCM team to meet health and wellness needs.  Discussed plans with  patient for ongoing care management follow up and provided patient with direct contact information for care management team  Patient Goals/Self-Care Activities  patient will:  - Patient will self administer medications as prescribed Patient will attend all scheduled provider appointments Patient will call pharmacy for medication refills Patient will attend church or other social activities Patient will continue to perform ADL's independently Patient will continue to perform IADL's independently Patient will call provider office for new concerns or questions Patient will work with BSW to address care coordination needs and will continue to work with the clinical team to address health care and disease management related needs.   - counseling provided - decision-making supported - depression screen reviewed - goal-setting facilitated - positive reinforcement provided - problem-solving facilitated - relaxation techniques promoted - self-care encouraged - self-reflection promoted - verbalization of feelings encouraged  Follow Up  Plan: Telephone follow up appointment with care management team member scheduled for: 10-21-2021 at 1145 am         Task: RNCM: Support Psychosocial Response to Stroke Completed 08/27/2021  Outcome: Positive  Note:   Care Management Activities:    - counseling provided - decision-making supported - depression screen reviewed - goal-setting facilitated - positive reinforcement provided - problem-solving facilitated - relaxation techniques promoted - self-care encouraged - self-reflection promoted - verbalization of feelings encouraged         Plan:Telephone follow up appointment with care management team member scheduled for:  10-21-2021 at 1145 am  Noreene Larsson RN, MSN, [facility] Mobile: [phone]

## 2021-08-28 ENCOUNTER — Telehealth: Payer: Self-pay

## 2021-08-28 ENCOUNTER — Other Ambulatory Visit: Payer: Self-pay | Admitting: Family Medicine

## 2021-08-28 NOTE — Progress Notes (Signed)
Curt Jews, I'm concerned that he's not going to be able to afford anything if he can't afford jardiance, and I doubt glipizide is going to work well enough. Anything you can do to help

## 2021-08-28 NOTE — Chronic Care Management (AMB) (Signed)
  Chronic Care Management   Note  08/28/2021 Name: ZACHARI ALBERTA MRN: [id number] DOB: 1956-03-24  AKIO HUDNALL is a 65 y.o. year old male who is a primary care patient of Dorcas Carrow, DO. JERIAN MORAIS is currently enrolled in care management services. An additional referral for pharm D was placed.   Follow up plan: Unsuccessful telephone outreach attempt made. A HIPAA compliant phone message was left for the patient providing contact information and requesting a return call.  The care management team will reach out to the patient again over the next 5 days.  If patient returns call to provider office, please advise to call Embedded Care Management Care Guide Penne Lash  at [phone]  Penne Lash, RMA Care Guide, Embedded Care Coordination [facility]  [place], [place] [address] Direct Dial: [phone] [email or web address] Website: [email or web address]

## 2021-09-01 ENCOUNTER — Ambulatory Visit: Payer: Medicare HMO

## 2021-09-01 ENCOUNTER — Telehealth: Payer: Self-pay

## 2021-09-01 NOTE — Progress Notes (Signed)
I will try to reach out to patient again I called on Friday and no answer   Penne Lash, RMA Care Guide, Embedded Care Coordination [facility]  [place], [place] [address] Direct Dial: [phone] [email or web address] Website: [email or web address]

## 2021-09-01 NOTE — Telephone Encounter (Signed)
This nurse attempted to call patient three times for telephonic AWV. Called at 1510, 1515, and 1525. Message left that someone will call him to reschedule for another time or he can call the office.

## 2021-09-03 NOTE — Chronic Care Management (AMB) (Signed)
  Chronic Care Management   Note  09/03/2021 Name: HOSIE SHARMAN MRN: [id number] DOB: [birth year]  JACOREY DONAWAY is a 65 y.o. year old male who is a primary care patient of Dorcas Carrow, DO. ALIC HILBURN is currently enrolled in care management services. An additional referral for Pharm D  was placed.   Follow up plan: Telephone appointment with care management team member scheduled for:09/09/2021  Penne Lash, RMA Care Guide, Embedded Care Coordination [facility]  [place], [place] [address] Direct Dial: [phone] [email or web address] Website: [email or web address]

## 2021-09-03 NOTE — Progress Notes (Signed)
Patient has been scheduled

## 2021-09-09 ENCOUNTER — Other Ambulatory Visit: Payer: Self-pay | Admitting: Family Medicine

## 2021-09-09 ENCOUNTER — Ambulatory Visit: Payer: Medicare HMO

## 2021-09-09 ENCOUNTER — Telehealth: Payer: Self-pay

## 2021-09-09 DIAGNOSIS — E1159 Type 2 diabetes mellitus with other circulatory complications: Secondary | ICD-10-CM

## 2021-09-09 DIAGNOSIS — I110 Hypertensive heart disease with heart failure: Secondary | ICD-10-CM

## 2021-09-09 DIAGNOSIS — E1169 Type 2 diabetes mellitus with other specified complication: Secondary | ICD-10-CM

## 2021-09-09 DIAGNOSIS — I70219 Atherosclerosis of native arteries of extremities with intermittent claudication, unspecified extremity: Secondary | ICD-10-CM

## 2021-09-09 DIAGNOSIS — E1165 Type 2 diabetes mellitus with hyperglycemia: Secondary | ICD-10-CM

## 2021-09-09 DIAGNOSIS — I152 Hypertension secondary to endocrine disorders: Secondary | ICD-10-CM

## 2021-09-09 DIAGNOSIS — I25119 Atherosclerotic heart disease of native coronary artery with unspecified angina pectoris: Secondary | ICD-10-CM

## 2021-09-09 DIAGNOSIS — Z951 Presence of aortocoronary bypass graft: Secondary | ICD-10-CM

## 2021-09-09 MED ORDER — DAPAGLIFLOZIN PROPANEDIOL 10 MG PO TABS: 10.0000 mg | ORAL_TABLET | Freq: Every day | ORAL | 3 refills | Status: DC

## 2021-09-09 NOTE — Progress Notes (Signed)
Ref card

## 2021-09-09 NOTE — Progress Notes (Signed)
Chronic Care Management Pharmacy Note  09/09/2021 Name:  Lucas Weiss MRN:  [id number] DOB:  15-Nov-1956  Summary: Open to new referral to cardiology - does not want to return to Dr. Fletcher Anon. Is scheduled for his 10/09/21 at [facility] SGLT2 -  Not approved for Jardiance, needing to apply for LIS first - will f/u to encourage patient to apply and meet w/ medicare counselor.  Farxiga trial card available if wanting to start this as SGLT2 in the meantime at local pharmacy.  Consider FOBT, last noted >1 year ago Consider switch to high intensity statin atorvastatin 80 mg  Subjective: Lucas Weiss is an 65 y.o. year old male who is a primary patient of [name], DO.  The CCM team was consulted for assistance with disease management and care coordination needs.    Engaged with patient by telephone for follow up visit in response to provider referral for pharmacy case management and/or care coordination services.   Consent to Services:  The patient was given information about Chronic Care Management services, agreed to services, and gave verbal consent prior to initiation of services.  Please see initial visit note for detailed documentation.   Patient Care Team: [name], DO as PCP - General (Family Medicine) [name], MD as Consulting Physician (Orthopedic Surgery) Vanita Ingles, RN as Case Manager ([place]) [name], [facility] (Pharmacist)  Hospital visits: None in previous 6 months  Objective:  Lab Results  Component Value Date   CREATININE 1.24 07/08/2021   CREATININE 1.12 10/08/2020   CREATININE 1.07 02/29/2020    Lab Results  Component Value Date   HGBA1C 7.3 (H) 07/08/2021   HGBA1C 8.0 (H) 03/27/2021   HGBA1C 7.9 (H) 12/27/2020   Last diabetic Eye exam: No results found for: HMDIABEYEEXA  Last diabetic Foot exam: No results found for: HMDIABFOOTEX      Component Value Date/Time   CHOL 177 07/08/2021 1404   CHOL 179 10/08/2020 1505    CHOL 201 (H) 09/04/2019 1340   TRIG 309 (H) 07/08/2021 1404   TRIG 269 (H) 10/08/2020 1505   TRIG 377 (H) 09/04/2019 1340   HDL 37 (L) 07/08/2021 1404   HDL 38 (L) 10/08/2020 1505   HDL 37 (L) 09/04/2019 1340   CHOLHDL 6.3 07/17/2011 0715   VLDL 48 (H) 07/17/2011 0715   LDLCALC 89 07/08/2021 1404   LDLCALC 96 10/08/2020 1505   LDLCALC 100 (H) 09/04/2019 1340    Hepatic Function Latest Ref Rng & Units 07/08/2021 09/04/2019 02/14/2019  Total Protein 6.0 - 8.5 g/dL 6.6 6.7 7.1  Albumin 3.8 - 4.8 g/dL 4.2 4.2 4.6  AST 0 - 40 IU/L 22 36 23  ALT 0 - 44 IU/L 26 40 29  Alk Phosphatase 44 - 121 IU/L 106 78 81  Total Bilirubin 0.0 - 1.2 mg/dL 0.2 0.3 0.3    Lab Results  Component Value Date/Time   TSH 1.440 10/08/2020 03:05 PM   TSH 1.460 09/04/2019 01:40 PM    CBC Latest Ref Rng & Units 07/08/2021 10/08/2020 09/04/2019  WBC 3.4 - 10.8 x10E3/uL 6.6 7.1 6.8  Hemoglobin 13.0 - 17.7 g/dL 11.8(L) 11.7(L) 13.2  Hematocrit 37.5 - 51.0 % 35.4(L) 36.7(L) 39.4  Platelets 150 - 450 x10E3/uL 198 327 204   No results found for: VD25OH  Clinical ASCVD:  The ASCVD Risk score (Arnett DK, et al., 2019) failed to calculate for the following reasons:   The patient has a prior MI or stroke  diagnosis   Social History   Tobacco Use  Smoking Status Former   Packs/day: 1.50   Years: 30.00   Pack years: 45.00   Types: Cigarettes   Quit date: 07/19/2008   Years since quitting: 13.1  Smokeless Tobacco Never   BP Readings from Last 3 Encounters:  07/11/21 140/60  07/08/21 132/64  03/27/21 (!) 159/89   Pulse Readings from Last 3 Encounters:  07/11/21 78  07/08/21 74  03/27/21 74   Wt Readings from Last 3 Encounters:  07/11/21 178 lb (80.7 kg)  07/08/21 178 lb (80.7 kg)  03/27/21 187 lb (84.8 kg)   BMI Readings from Last 3 Encounters:  07/11/21 24.83 kg/m  07/08/21 24.83 kg/m  03/27/21 26.83 kg/m    Assessment: Review of patient past medical history, allergies, medications, health  status, including review of consultants reports, laboratory and other test data, was performed as part of comprehensive evaluation and provision of chronic care management services.   SDOH:  (Social Determinants of Health) assessments and interventions performed: Yes   CCM Care Plan  No Known Allergies  Medications Reviewed Today     Reviewed by [name], [facility] (Pharmacist) on 09/09/21 at [place] List Status: <None>   Medication Order Taking? Sig Documenting Provider Last Dose Status Informant  amLODipine (NORVASC) 5 MG tablet [id number] No Take 1 tablet (5 mg total) by mouth daily. Park Liter P, DO Taking Active   aspirin EC 81 MG tablet [id number] No Take 81 mg by mouth daily. [provider] Taking Active   Blood Glucose Monitoring Suppl (ONETOUCH VERIO) w/Device KIT [id number] No To check blood sugar twice a day and document, bring to provider visits. Marnee Guarneri T, NP Taking Active   empagliflozin (JARDIANCE) 25 MG TABS tablet [id number] No Take 1 tablet (25 mg total) by mouth daily before breakfast.  Patient not taking: Reported on 07/11/2021   [name], DO Not Taking Active   gabapentin (NEURONTIN) 300 MG capsule [id number] No Take 300 mg by mouth 3 (three) times daily. [provider] Taking Active   glucose blood (ONETOUCH VERIO) test strip [id number] No To check blood sugar twice a day and document, bring to provider visits. Marnee Guarneri T, NP Taking Active   Lancets [facility] ULTRASOFT) lancets [id number] No To check blood sugar twice a day and document, bring to provider visits. Marnee Guarneri T, NP Taking Active   lisinopril (ZESTRIL) 40 MG tablet [id number]  TAKE ONE TABLET BY MOUTH DAILY Wynetta Emery, Megan P, DO  Active   lovastatin (MEVACOR) 20 MG tablet [id number] No TAKE TWO TABLETS BY MOUTH EVERY NIGHT AT BEDTIME. Park Liter P, DO Taking Active   metFORMIN (GLUCOPHAGE) 500 MG tablet [id number] No TAKE TWO TABLETS BY MOUTH TWICE A DAY WITH  FOOD Johnson, Megan P, DO Taking Active   metoprolol tartrate (LOPRESSOR) 50 MG tablet [id number] No Take 1 tablet (50 mg total) by mouth daily. Johnson, Megan P, DO Taking Active   nitroGLYCERIN (NITROSTAT) 0.4 MG SL tablet [id number] No Place 1 tablet (0.4 mg total) under the tongue every 5 (five) minutes as needed for chest pain. Johnson, Megan P, DO Taking Active   QUEtiapine (SEROQUEL) 25 MG tablet [id number] No Take 1 tablet (25 mg total) by mouth at bedtime.  Patient not taking: No sig reported   [name], DO Not Taking Active             Patient Active Problem List   Diagnosis Date Noted  Multiple rib fractures 10/08/2020   Insomnia 05/06/2017   History of left hip replacement 05/19/2016   Controlled type 2 diabetes mellitus ([place]) 01/09/2016   Tobacco abuse, in remission 01/09/2016   Severe recurrent major depression ([place]) 06/14/2015   Chronic pain 06/14/2015   Alcohol abuse, in remission    Atherosclerotic PVD with intermittent claudication ([place]) 10/25/2014   Hypertension associated with diabetes ([place]) 09/17/2014   Renal artery stenosis, native, bilateral ([place]) 02/20/2013   CAD (coronary artery disease) 09/16/2012   PAD (peripheral artery disease): Left common iliac stent plus additional stent(09/15/12) for ISRS. 09/16/2012   S/P CABG x 5: 2009: LIMA to LAD, SVG to Diag, Seq SVG to OM2-3, SVG to PDA 09/16/2012   Hypertensive heart failure ([place]) 09/16/2012   Hyperlipidemia associated with type 2 diabetes mellitus ([place]) 09/16/2012    Immunization History  Administered Date(s) Administered   Influenza Split 09/16/2012   Influenza,inj,Quad PF,6+ Mos 09/06/2015, 09/14/2016, 09/09/2017, 10/17/2018, 09/04/2019, 10/04/2020   Influenza-Unspecified 09/12/2014   Pneumococcal Polysaccharide-23 05/28/2010, 05/20/2016   Td 12/27/2020   Tdap 05/28/2010    Conditions to be addressed/monitored: CAD, PVD, bilateral renal artery stenosis, CHF, DMII, HTN, HLD, alcohol and  tobacco abuse in remission  Care Plan : [place] plan  Updates made by [name], [facility] since 09/09/2021 12:00 AM     Problem: DM, HTN, HLD   Priority: High     Long-Range Goal: Disease management   Start Date: 09/09/2021  Expected End Date: 09/09/2022  Recent Progress: Not on track  Priority: High  Note:   Current Barriers:  Unable to independently afford treatment regimen Unable to independently monitor therapeutic efficacy Unable to achieve control of DM   Pharmacist Clinical Goal(s):  Patient will verbalize ability to afford treatment regimen achieve adherence to monitoring guidelines and medication adherence to achieve therapeutic efficacy contact provider office for questions/concerns as evidenced notation of same in electronic health record through collaboration with PharmD and provider.   Interventions: 1:1 collaboration with [name], DO regarding development and update of comprehensive plan of care as evidenced by provider attestation and co-signature Inter-disciplinary care team collaboration (see longitudinal plan of care) Comprehensive medication review performed; medication list updated in electronic medical record  Hypertension (BP goal <140/90) -Not ideally controlled -Current treatment: Metoprolol tartrate 50 mg twice daily (pt reported 09/09/21) Lisinopril 40 mg daily  Amlodipine 5 mg twice daily (pt reported 09/09/21) -Current home readings: reports some >150/90 recently, has had arthritis pain and is in the process of moving out of trailer.  -Reviewed home BP monitoring techniques -Denies hypotensive/hypertensive symptoms -Educated on BP goals and benefits of medications for prevention of heart attack, stroke and kidney damage; Proper BP monitoring technique; -Counseled to monitor BP at home 2-3x/wk, document, and provide log at future appointments -Counseled on diet and exercise extensively Recommended to continue current  medication Agreeable to seeing new cardiologist per preference stated above  Hyperlipidemia: (LDL goal < 100) -Not ideally controlled -Secondary prevention  -Current treatment: Lovastatin 20 mg once daily  -Reviewed tolerability/side effects - no problems noted. -On chart review hx or atorvastatin and rosuvastatin noted. No recall of side effects.  -Educated on Cholesterol goals;  Benefits of statin for ASCVD risk reduction; -Consider switch to high intensity statin, atorvastatin 80 mg - next PCP visit 10/20   Diabetes (A1c goal <7%) -Not ideally controlled -GFR >70 -Current medications: Jardiance 25 mg once daily (Not taking) Metformin 500 mg tabs - two tabs (1038m) twice daily  -Medications  previously tried: Glipizide, metformin XR  -Current home glucose readings fasting glucose:  not reported post prandial glucose: not reported  -Denies hypoglycemic/hyperglycemic symptoms -Current meal patterns: low carb -Current exercise: work around the house - in the process of moving. -Educated on A1c and blood sugar goals; Benefits of routine self-monitoring of blood sugar; -Counseled to check feet daily and get yearly eye exams -Assessed finances - will encourage pt to reach out to [facility] and apply due to income within range -Will review [name] due to availability of voucher  Health Maintenance -Gaps:   Colon cancer screening:   Colonoscopy 2010, FOBT last done >52yr Eye and foot exam: due for eye, foot 10/08/2021 -Counseled on diet and exercise extensively  Medication Assistance: CPA to call pt regarding need to apply for LIS and contact [place] Patient's [facility] pharmacy [id number] [id number]: [phone]: [phone]  Follow Up:  Patient agrees to Care Plan and Follow-up. Plan: HC contact patient regarding NCSHIIP 1 month f/u on LIS/PAP. Pharmacist 3  month f/u.      Future Appointments  Date Time Provider [place]rest[place] 10/09/2021  3:00 PM JValerie Roys DNevadaCFP-CFP [facility] 10/21/2021 11:45 AM CFP CCM CASE MANAGER CFP-CFP PEC   JMadelin Rear PharmD, BOxford JunctionPharmacist  [phone]

## 2021-09-09 NOTE — Telephone Encounter (Signed)
error 

## 2021-09-09 NOTE — Patient Instructions (Addendum)
Lucas Weiss,  Thank you for talking with me today. I have included our care plan/goals in the following pages.   Please review and call me at [phone] with any questions.  Thanks! Lucas Weiss, PharmD Clinical Pharmacist  [phone]  Care Plan : [place] plan  Updates made by Lucas Weiss, [facility] since 09/09/2021 12:00 AM     Problem: DM, HTN, HLD   Priority: High     Long-Range Goal: Disease management   Start Date: 09/09/2021  Expected End Date: 09/09/2022  Recent Progress: Not on track  Priority: High  Note:   Current Barriers:  Unable to independently afford treatment regimen Unable to independently monitor therapeutic efficacy Unable to achieve control of DM   Pharmacist Clinical Goal(s):  Patient will verbalize ability to afford treatment regimen achieve adherence to monitoring guidelines and medication adherence to achieve therapeutic efficacy contact provider office for questions/concerns as evidenced notation of same in electronic health record through collaboration with PharmD and provider.   Interventions: 1:1 collaboration with Lucas Roys, DO regarding development and update of comprehensive plan of care as evidenced by provider attestation and co-signature Inter-disciplinary care team collaboration (see longitudinal plan of care) Comprehensive medication review performed; medication list updated in electronic medical record  Hypertension (BP goal <140/90) -Not ideally controlled -Current treatment: Metoprolol tartrate 50 mg twice daily (pt reported 09/09/21) Lisinopril 40 mg daily  Amlodipine 5 mg twice daily (pt reported 09/09/21) -Current home readings: reports some >150/90 recently, has had arthritis pain and is in the process of moving out of trailer.  -Reviewed home BP monitoring techniques -Denies hypotensive/hypertensive symptoms -Educated on BP goals and benefits of medications for prevention of heart attack, stroke and  kidney damage; Proper BP monitoring technique; -Counseled to monitor BP at home 2-3x/wk, document, and provide log at future appointments -Counseled on diet and exercise extensively Recommended to continue current medication Agreeable to seeing new cardiologist per preference stated above  Hyperlipidemia: (LDL goal < 100) -Not ideally controlled -Secondary prevention  -Current treatment: Lovastatin 20 mg once daily  -Reviewed tolerability/side effects - no problems noted. -On chart review hx or atorvastatin and rosuvastatin noted. No recall of side effects.  -Educated on Cholesterol goals;  Benefits of statin for ASCVD risk reduction; -Consider switch to high intensity statin, atorvastatin 80 mg - next PCP visit 10/20   Diabetes (A1c goal <7%) -Not ideally controlled -GFR >70 -Current medications: Jardiance 25 mg once daily (Not taking) Metformin 500 mg tabs - two tabs (1073m) twice daily  -Medications previously tried: Glipizide, metformin XR  -Current home glucose readings fasting glucose:  not reported post prandial glucose: not reported  -Denies hypoglycemic/hyperglycemic symptoms -Current meal patterns: low carb -Current exercise: work around the house - in the process of moving. -Educated on A1c and blood sugar goals; Benefits of routine self-monitoring of blood sugar; -Counseled to check feet daily and get yearly eye exams -Assessed finances - will encourage pt to [place] due to [place] within range -Will review [facility] due to availability of voucher  Health Maintenance -Gaps:   Colon cancer screening:   Colonoscopy 2010, FOBT last done >187yrEye and foot exam: due for eye, foot 10/08/2021 -Counseled on diet and exercise extensively  Medication Assistance: CPA to call pt regarding need to apply for LIS and contact [place] preferred pharmacy is:  [facility] [id number]  [place] [address] Phone: [phone] Fax: [phone]   Follow Up:  Patient agrees to Care Plan and Follow-up. Plan: HC contact patient regarding NCSHIIP 1 month f/u on LIS/PAP. Pharmacist 3 month f/u.     The patient was given the following information about Chronic Care Management services today, agreed to services, and gave verbal consent: 1. CCM service includes personalized support from designated clinical staff supervised by the primary care provider, including individualized plan of care and coordination with other care providers 2. 24/7 contact phone numbers for assistance for urgent and routine care needs. 3. Service will only be billed when office clinical staff spend 20 minutes or more in a month to coordinate care. 4. Only one practitioner may furnish and bill the service in a calendar month. 5.The patient may stop CCM services at any time (effective at the end of the month) by phone call to the office staff. 6. The patient will be responsible for cost sharing (co-pay) of up to 20% of the service fee (after annual deductible is met). Patient agreed to services and consent obtained.  The patient verbalized understanding of instructions provided today and declined to receive a copy of patient instruction and/or educational materials. Telephone follow up appointment with pharmacy team member scheduled for: See next appointment with "Care Management Staff" under "What's Next" below.

## 2021-09-10 ENCOUNTER — Other Ambulatory Visit: Payer: Medicare HMO

## 2021-09-12 ENCOUNTER — Ambulatory Visit: Payer: Medicare HMO | Admitting: Nurse Practitioner

## 2021-09-18 ENCOUNTER — Telehealth: Payer: Self-pay

## 2021-09-18 NOTE — Progress Notes (Signed)
Patients Jardiance was not approved for assistance due to patient not previously applying for extra help on medications. The CPP has reviewed LIS and states that the patient would probably qualify based on the information he provided. Per CPP "Can you call patient and provided this number for him to reach out and set something up with medicare counselor?"  NCSHIIP [phone]   Per request I had an unsuccessful attempt to reach patient, phone line sounded busy or out of service.

## 2021-09-19 DIAGNOSIS — E1165 Type 2 diabetes mellitus with hyperglycemia: Secondary | ICD-10-CM | POA: Diagnosis not present

## 2021-09-19 DIAGNOSIS — I152 Hypertension secondary to endocrine disorders: Secondary | ICD-10-CM

## 2021-09-19 DIAGNOSIS — E1169 Type 2 diabetes mellitus with other specified complication: Secondary | ICD-10-CM

## 2021-09-19 DIAGNOSIS — E785 Hyperlipidemia, unspecified: Secondary | ICD-10-CM

## 2021-09-19 DIAGNOSIS — I25119 Atherosclerotic heart disease of native coronary artery with unspecified angina pectoris: Secondary | ICD-10-CM

## 2021-09-19 DIAGNOSIS — E119 Type 2 diabetes mellitus without complications: Secondary | ICD-10-CM

## 2021-09-19 DIAGNOSIS — E1159 Type 2 diabetes mellitus with other circulatory complications: Secondary | ICD-10-CM | POA: Diagnosis not present

## 2021-09-19 DIAGNOSIS — E782 Mixed hyperlipidemia: Secondary | ICD-10-CM | POA: Diagnosis not present

## 2021-10-07 ENCOUNTER — Other Ambulatory Visit: Payer: Self-pay | Admitting: Family Medicine

## 2021-10-07 DIAGNOSIS — I110 Hypertensive heart disease with heart failure: Secondary | ICD-10-CM

## 2021-10-07 NOTE — Telephone Encounter (Signed)
Requested Prescriptions  Pending Prescriptions Disp Refills  . amLODipine (NORVASC) 5 MG tablet [Pharmacy Med Name: amLODIPine BESYLATE 5 MG TAB] 90 tablet 0    Sig: TAKE 1 TABLET BY MOUTH DAILY.     Cardiovascular:  Calcium Channel Blockers Failed - 10/07/2021  1:29 PM      Failed - Last BP in normal range    BP Readings from Last 1 Encounters:  07/11/21 140/60         Passed - Valid encounter within last 6 months    Recent Outpatient Visits          3 months ago Controlled type 2 diabetes mellitus with hyperglycemia, without long-term current use of insulin (HCC)   [facility] [place], Megan P, DO   4 months ago Upper respiratory tract infection, unspecified type   [facility] Larae Grooms, NP   6 months ago Controlled type 2 diabetes mellitus with hyperglycemia, without long-term current use of insulin (HCC)   [facility], Megan P, DO   9 months ago Controlled type 2 diabetes mellitus with hyperglycemia, without long-term current use of insulin (HCC)   [facility], Megan P, DO   11 months ago Closed fracture of multiple ribs of right side with routine healing, subsequent encounter   [facility], Megan P, DO      Future Appointments            In 2 days Laural Benes, Oralia Rud, DO [facility], [facility]           . metoprolol tartrate (LOPRESSOR) 50 MG tablet [Pharmacy Med Name: METOPROLOL TARTRATE 50 MG TAB] 90 tablet 1    Sig: TAKE 1 TABLET BY MOUTH DAILY.     Cardiovascular:  Beta Blockers Failed - 10/07/2021  1:29 PM      Failed - Last BP in normal range    BP Readings from Last 1 Encounters:  07/11/21 140/60         Passed - Last Heart Rate in normal range    Pulse Readings from Last 1 Encounters:  07/11/21 78         Passed - Valid encounter within last 6 months    Recent Outpatient Visits          3 months ago Controlled type 2 diabetes mellitus with  hyperglycemia, without long-term current use of insulin (HCC)   [facility] [place], Megan P, DO   4 months ago Upper respiratory tract infection, unspecified type   [facility] Larae Grooms, NP   6 months ago Controlled type 2 diabetes mellitus with hyperglycemia, without long-term current use of insulin (HCC)   [facility], Megan P, DO   9 months ago Controlled type 2 diabetes mellitus with hyperglycemia, without long-term current use of insulin (HCC)   [facility], Megan P, DO   11 months ago Closed fracture of multiple ribs of right side with routine healing, subsequent encounter   [facility] Dorcas Carrow, DO      Future Appointments            In 2 days Laural Benes, Oralia Rud, DO [facility], [facility]

## 2021-10-09 ENCOUNTER — Encounter: Payer: Medicare HMO | Admitting: Family Medicine

## 2021-10-13 ENCOUNTER — Telehealth: Payer: Self-pay

## 2021-10-13 NOTE — Chronic Care Management (AMB) (Signed)
Chronic Care Management Pharmacy Assistant   Name: Lucas Weiss  MRN: [id number] DOB: October 09, 1956   Reason for Encounter: Disease State Diabetes    Recent office visits:  None noted   Recent consult visits:  None noted   Hospital visits:  None in previous 6 months  Medications: Outpatient Encounter Medications as of 10/13/2021  Medication Sig   amLODipine (NORVASC) 5 MG tablet TAKE 1 TABLET BY MOUTH DAILY.   aspirin EC 81 MG tablet Take 81 mg by mouth daily.   Blood Glucose Monitoring Suppl (ONETOUCH VERIO) w/Device KIT To check blood sugar twice a day and document, bring to provider visits.   dapagliflozin propanediol (FARXIGA) 10 MG TABS tablet Take 1 tablet (10 mg total) by mouth daily before breakfast.   gabapentin (NEURONTIN) 300 MG capsule Take 300 mg by mouth 3 (three) times daily.   glucose blood (ONETOUCH VERIO) test strip To check blood sugar twice a day and document, bring to provider visits.   Lancets (ONETOUCH ULTRASOFT) lancets To check blood sugar twice a day and document, bring to provider visits.   lisinopril (ZESTRIL) 40 MG tablet TAKE ONE TABLET BY MOUTH DAILY   lovastatin (MEVACOR) 20 MG tablet TAKE TWO TABLETS BY MOUTH EVERY NIGHT AT BEDTIME.   metFORMIN (GLUCOPHAGE) 500 MG tablet TAKE TWO TABLETS BY MOUTH TWICE A DAY WITH FOOD   metoprolol tartrate (LOPRESSOR) 50 MG tablet TAKE 1 TABLET BY MOUTH DAILY.   nitroGLYCERIN (NITROSTAT) 0.4 MG SL tablet Place 1 tablet (0.4 mg total) under the tongue every 5 (five) minutes as needed for chest pain.   QUEtiapine (SEROQUEL) 25 MG tablet Take 1 tablet (25 mg total) by mouth at bedtime. (Patient not taking: No sig reported)   No facility-administered encounter medications on file as of 10/13/2021.   Recent Relevant Labs: Lab Results  Component Value Date/Time   HGBA1C 7.3 (H) 07/08/2021 01:59 PM   HGBA1C 8.0 (H) 03/27/2021 01:28 PM   MICROALBUR 150 (H) 10/08/2020 03:01 PM   MICROALBUR 150 (H) 09/04/2019 01:38  PM    Kidney Function Lab Results  Component Value Date/Time   CREATININE 1.24 07/08/2021 02:04 PM   CREATININE 1.12 10/08/2020 03:05 PM   CREATININE 1.10 06/02/2012 08:35 AM   GFRNONAA 70 10/08/2020 03:05 PM   GFRNONAA >60 06/02/2012 08:35 AM   GFRAA 81 10/08/2020 03:05 PM   GFRAA >60 06/02/2012 08:35 AM    Current antihyperglycemic regimen:  metFORMIN (GLUCOPHAGE) 500 MG tablet dapagliflozin propanediol (FARXIGA) 10 MG TABS tablet What recent interventions/DTPs have been made to improve glycemic control:   Have there been any recent hospitalizations or ED visits since last visit with CPP?  Patient hypoglycemic symptoms, including  Patient hyperglycemic symptoms, including  How often are you checking your blood sugar?  What are your blood sugars ranging?   During the week, how often does your blood glucose drop below 70?  Are you checking your feet daily/regularly?   Adherence Review: Is the patient currently on a STATIN medication? Yes Is the patient currently on ACE/ARB medication? Yes Does the patient have >5 day gap between last estimated fill dates? No   Care Gaps: OPHTHALMOLOGY EXAM Never done  COLON CANCER SCREENING ANNUAL FOBT Last completed: Feb 06, 2016 FOOT EXAM st completed: Oct 08, 2020  Attempted to reach patient on his home phone number and it seems like it is disconnected. Tried to reach patient on alternate phone number and was hung up on. Unsuccessful attempt to call patient  a total of 3 times.    Star Rating Drugs: dapagliflozin propanediol (FARXIGA) 10 MG TABS tablet - Last fill 09/09/2021 30 DS lisinopril (ZESTRIL) 40 MG tablet- Last fill 08/29/2021 90 DS  lovastatin (MEVACOR) 20 MG tablet- Last fill 10/08/2021 DS metFORMIN (GLUCOPHAGE) 500 MG tablet- Last fill 06/26/2021 90 DS     Andee Poles, CMA

## 2021-10-18 ENCOUNTER — Other Ambulatory Visit: Payer: Self-pay | Admitting: Family Medicine

## 2021-10-19 NOTE — Telephone Encounter (Signed)
Requested Prescriptions  Pending Prescriptions Disp Refills  . metFORMIN (GLUCOPHAGE) 500 MG tablet [Pharmacy Med Name: metFORMIN HCL 500 MG TABLET] 360 tablet 0    Sig: TAKE 2 TABLETS BY MOUTH TWICE A DAY WITH FOOD.     Endocrinology:  Diabetes - Biguanides Passed - 10/18/2021  8:21 PM      Passed - Cr in normal range and within 360 days    Creatinine  Date Value Ref Range Status  06/02/2012 1.10 0.60 - 1.30 mg/dL Final   Creatinine, Ser  Date Value Ref Range Status  07/08/2021 1.24 0.76 - 1.27 mg/dL Final         Passed - HBA1C is between 0 and 7.9 and within 180 days    HB A1C (BAYER DCA - WAIVED)  Date Value Ref Range Status  07/08/2021 7.3 (H) <7.0 % Final    Comment:                                          Diabetic Adult            <7.0                                       Healthy Adult        4.3 - 5.7                                                           (DCCT/NGSP) American Diabetes Association's Summary of Glycemic Recommendations for Adults with Diabetes: Hemoglobin A1c <7.0%. More stringent glycemic goals (A1c <6.0%) may further reduce complications at the cost of increased risk of hypoglycemia.          Passed - eGFR in normal range and within 360 days    EGFR (African American)  Date Value Ref Range Status  06/02/2012 >60  Final   GFR calc Af Amer  Date Value Ref Range Status  10/08/2020 81 >59 mL/min/1.73 Final    Comment:    **In accordance with recommendations from the NKF-ASN Task force,**   [facility] is in the process of updating its eGFR calculation to the   2021 CKD-EPI creatinine equation that estimates kidney function   without a race variable.    EGFR (Non-African Amer.)  Date Value Ref Range Status  06/02/2012 >60  Final    Comment:    eGFR values <68m/min/1.73 m2 may be an indication of chronic kidney disease (CKD). Calculated eGFR is useful in patients with stable renal function. The eGFR calculation will not be reliable in acutely  ill patients when serum creatinine is changing rapidly. It is not useful in  patients on dialysis. The eGFR calculation may not be applicable to patients at the low and high extremes of body sizes, pregnant women, and vegetarians.    GFR calc non Af Amer  Date Value Ref Range Status  10/08/2020 70 >59 mL/min/1.73 Final   eGFR  Date Value Ref Range Status  07/08/2021 65 >59 mL/min/1.73 Final         Passed - Valid encounter within last 6 months    Recent Outpatient Visits  3 months ago Controlled type 2 diabetes mellitus with hyperglycemia, without long-term current use of insulin ([place])   [place], Megan P, DO   4 months ago Upper respiratory tract infection, unspecified type   [facility] Jon Billings, NP   6 months ago Controlled type 2 diabetes mellitus with hyperglycemia, without long-term current use of insulin ([place])   [facility], Megan P, DO   9 months ago Controlled type 2 diabetes mellitus with hyperglycemia, without long-term current use of insulin [facility])   [facility], Megan P, DO   11 months ago Closed fracture of multiple ribs of right side with routine healing, subsequent encounter   [facility], Megan P, DO

## 2021-10-21 ENCOUNTER — Telehealth: Payer: Self-pay

## 2021-10-21 ENCOUNTER — Telehealth: Payer: Medicare HMO

## 2021-10-21 ENCOUNTER — Telehealth: Payer: Self-pay | Admitting: Family Medicine

## 2021-10-21 NOTE — Telephone Encounter (Signed)
Copied from CRM [id number]. Topic: Medicare AWV >> Oct 21, 2021 10:36 AM Leigh Aurora wrote: Reason for CRM:  Left message for patient to call back and schedule the Medicare Annual Wellness Visit (AWV) virtually or by telephone.  Last AWV 08/30/20  Please schedule at anytime with CFP-Nurse Health Advisor.  45 minute appointment  Any questions, please call me at [phone]

## 2021-10-21 NOTE — Telephone Encounter (Signed)
  Care Management   Follow Up Note   10/21/2021 Name: Lucas Weiss MRN: [id number] DOB: 05-16-1956   Referred by: Dorcas Carrow, DO Reason for referral : Chronic Care Management (RNCM: Follow up for Chronic Disease Management and Care Coordination Needs )   An unsuccessful telephone outreach was attempted today. The patient was referred to the case management team for assistance with care management and care coordination.   Follow Up Plan: The care management team will reach out to the patient again over the next 30 to 60 days.   Alto Denver RN, MSN, CCM Community Care Coordinator [place]  [facility] Mobile: [phone]

## 2021-10-22 ENCOUNTER — Other Ambulatory Visit: Payer: Self-pay

## 2021-10-22 ENCOUNTER — Ambulatory Visit (INDEPENDENT_AMBULATORY_CARE_PROVIDER_SITE_OTHER): Payer: Medicare HMO

## 2021-10-22 DIAGNOSIS — Z23 Encounter for immunization: Secondary | ICD-10-CM | POA: Diagnosis not present

## 2021-10-27 ENCOUNTER — Telehealth: Payer: Self-pay

## 2021-10-27 NOTE — Chronic Care Management (AMB) (Signed)
  Care Management   Note  10/27/2021 Name: AVANTAE BITHER MRN: [id number] DOB: [birth year]  JAIVYN GULLA is a 65 y.o. year old male who is a primary care patient of Dorcas Carrow, DO and is actively engaged with the care management team. I reached out to [name] by phone today to assist with re-scheduling a follow up visit with the RN Case Manager  Follow up plan: Unsuccessful telephone outreach attempt made. A HIPAA compliant phone message was left for the patient providing contact information and requesting a return call.  The care management team will reach out to the patient again over the next 7 days.  If patient returns call to provider office, please advise to call Embedded Care Management Care Guide Penne Lash  at [phone]  Penne Lash, RMA Care Guide, Embedded Care Coordination [facility]  [place], [place] [address] Direct Dial: [phone] [email or web address] Website: [email or web address]

## 2021-11-10 NOTE — Chronic Care Management (AMB) (Signed)
  Care Management   Note  11/10/2021 Name: Lucas Weiss MRN: [id number] DOB: Oct 25, 1956  [name] is a 65 y.o. year old male who is a primary care patient of Dorcas Carrow, DO and is actively engaged with the care management team. I reached out to [name] by phone today to assist with re-scheduling a follow up visit with the RN Case Manager  Follow up plan: Unsuccessful telephone outreach attempt made. A HIPAA compliant phone message was left for the patient providing contact information and requesting a return call.  The care management team will reach out to the patient again over the next 7 days.  If patient returns call to provider office, please advise to call Embedded Care Management Care Guide Penne Lash  at [phone]  Penne Lash, RMA Care Guide, Embedded Care Coordination [facility]  [place], [place] [address] Direct Dial: [phone] [email or web address] Website: [email or web address]

## 2021-11-21 NOTE — Chronic Care Management (AMB) (Signed)
  Care Management   Note  11/21/2021 Name: ELVERT CUMPTON MRN: [id number] DOB: 1956/09/10  SMARAN GAUS is a 65 y.o. year old male who is a primary care patient of Dorcas Carrow, DO and is actively engaged with the care management team. I reached out to [name] by phone today to assist with re-scheduling a follow up visit with the RN Case Manager  Follow up plan: Unable to make contact on outreach attempts x 3. PCP Dorcas Carrow, DO notified via routed documentation in medical record.   Penne Lash, RMA Care Guide, Embedded Care Coordination [facility]  [place], [place] [address] Direct Dial: [phone] [email or web address] Website: [email or web address]

## 2021-11-21 NOTE — Telephone Encounter (Signed)
3rd unsuccessful outreach  

## 2021-12-09 ENCOUNTER — Other Ambulatory Visit: Payer: Self-pay | Admitting: Family Medicine

## 2021-12-09 NOTE — Telephone Encounter (Signed)
Requested Prescriptions  Pending Prescriptions Disp Refills   lisinopril (ZESTRIL) 40 MG tablet [Pharmacy Med Name: LISINOPRIL 40 MG TABLET] 90 tablet 0    Sig: TAKE ONE TABLET BY MOUTH DAILY     Cardiovascular:  ACE Inhibitors Failed - 12/09/2021  9:25 AM      Failed - Last BP in normal range    BP Readings from Last 1 Encounters:  07/11/21 140/60         Passed - Cr in normal range and within 180 days    Creatinine  Date Value Ref Range Status  06/02/2012 1.10 0.60 - 1.30 mg/dL Final   Creatinine, Ser  Date Value Ref Range Status  07/08/2021 1.24 0.76 - 1.27 mg/dL Final         Passed - K in normal range and within 180 days    Potassium  Date Value Ref Range Status  07/08/2021 4.6 3.5 - 5.2 mmol/L Final  06/02/2012 4.5 3.5 - 5.1 mmol/L Final         Passed - Patient is not pregnant      Passed - Valid encounter within last 6 months    Recent Outpatient Visits          5 months ago Controlled type 2 diabetes mellitus with hyperglycemia, without long-term current use of insulin (HCC)   [facility] [place], Megan P, DO   6 months ago Upper respiratory tract infection, unspecified type   [facility] Larae Grooms, NP   8 months ago Controlled type 2 diabetes mellitus with hyperglycemia, without long-term current use of insulin (HCC)   [facility], Megan P, DO   11 months ago Controlled type 2 diabetes mellitus with hyperglycemia, without long-term current use of insulin (HCC)   [facility] [place], Megan P, DO   1 year ago Closed fracture of multiple ribs of right side with routine healing, subsequent encounter   [facility] [place], [place] P, DO             Patient will need to make an appointment for further refills.

## 2022-01-12 ENCOUNTER — Other Ambulatory Visit: Payer: Self-pay | Admitting: Family Medicine

## 2022-01-12 DIAGNOSIS — I110 Hypertensive heart disease with heart failure: Secondary | ICD-10-CM

## 2022-01-12 DIAGNOSIS — E782 Mixed hyperlipidemia: Secondary | ICD-10-CM

## 2022-01-12 NOTE — Telephone Encounter (Signed)
Requested medication (s) are due for refill today: yes  Requested medication (s) are on the active medication list: yes  Last refill:  April 2022 for Mevacor, Oct 2022 for Glucophage, Norvasc and Lopressor  Future visit scheduled: No, NO SHOW 10/09/21, has been unable to be reached to reschedule.  Notes to clinic:  Has not had a return visit as requested, unable to be reached to reschedule, please assess.  Requested Prescriptions  Pending Prescriptions Disp Refills   metFORMIN (GLUCOPHAGE) 500 MG tablet [Pharmacy Med Name: metFORMIN HCL 500 MG TABLET] 360 tablet 0    Sig: TAKE TWO TABLETS BY MOUTH TWICE A DAY WITH FOOD     Endocrinology:  Diabetes - Biguanides Failed - 01/12/2022  1:19 PM      Failed - HBA1C is between 0 and 7.9 and within 180 days    HB A1C (BAYER DCA - WAIVED)  Date Value Ref Range Status  07/08/2021 7.3 (H) <7.0 % Final    Comment:                                          Diabetic Adult            <7.0                                       Healthy Adult        4.3 - 5.7                                                           (DCCT/NGSP) American Diabetes Association's Summary of Glycemic Recommendations for Adults with Diabetes: Hemoglobin A1c <7.0%. More stringent glycemic goals (A1c <6.0%) may further reduce complications at the cost of increased risk of hypoglycemia.           Failed - Valid encounter within last 6 months    Recent Outpatient Visits           6 months ago Controlled type 2 diabetes mellitus with hyperglycemia, without long-term current use of insulin ([place])   [place], Megan P, DO   7 months ago Upper respiratory tract infection, unspecified type   [facility] Jon Billings, NP   9 months ago Controlled type 2 diabetes mellitus with hyperglycemia, without long-term current use of insulin ([place])   [place], Megan P, DO   1 year ago Controlled type 2 diabetes mellitus with  hyperglycemia, without long-term current use of insulin [facility])   [place], Megan P, DO   1 year ago Closed fracture of multiple ribs of right side with routine healing, subsequent encounter   [facility], Megan P, DO              Passed - Cr in normal range and within 360 days    Creatinine  Date Value Ref Range Status  06/02/2012 1.10 0.60 - 1.30 mg/dL Final   Creatinine, Ser  Date Value Ref Range Status  07/08/2021 1.24 0.76 - 1.27 mg/dL Final          Passed - eGFR  in normal range and within 360 days    EGFR (African American)  Date Value Ref Range Status  06/02/2012 >60  Final   GFR calc Af Amer  Date Value Ref Range Status  10/08/2020 81 >59 mL/min/1.73 Final    Comment:    **In accordance with recommendations from the NKF-ASN Task force,**   [facility] is in the process of updating its eGFR calculation to the   2021 CKD-EPI creatinine equation that estimates kidney function   without a race variable.    EGFR (Non-African Amer.)  Date Value Ref Range Status  06/02/2012 >60  Final    Comment:    eGFR values <55m/min/1.73 m2 may be an indication of chronic kidney disease (CKD). Calculated eGFR is useful in patients with stable renal function. The eGFR calculation will not be reliable in acutely ill patients when serum creatinine is changing rapidly. It is not useful in  patients on dialysis. The eGFR calculation may not be applicable to patients at the low and high extremes of body sizes, pregnant women, and vegetarians.    GFR calc non Af Amer  Date Value Ref Range Status  10/08/2020 70 >59 mL/min/1.73 Final   eGFR  Date Value Ref Range Status  07/08/2021 65 >59 mL/min/1.73 Final           lovastatin (MEVACOR) 20 MG tablet [Pharmacy Med Name: LOVASTATIN 20 MG TABLET] 180 tablet 1    Sig: TAKE TWO TABLETS BY MOUTH EVERY NIGHT AT BEDTIME.     Cardiovascular:  Antilipid - Statins Failed - 01/12/2022  1:19 PM       Failed - HDL in normal range and within 360 days    HDL  Date Value Ref Range Status  07/08/2021 37 (L) >39 mg/dL Final          Failed - Triglycerides in normal range and within 360 days    Triglycerides  Date Value Ref Range Status  07/08/2021 309 (H) 0 - 149 mg/dL Final          Passed - Total Cholesterol in normal range and within 360 days    Cholesterol, Total  Date Value Ref Range Status  07/08/2021 177 100 - 199 mg/dL Final          Passed - LDL in normal range and within 360 days    LDL Chol Calc (NIH)  Date Value Ref Range Status  07/08/2021 89 0 - 99 mg/dL Final          Passed - Patient is not pregnant      Passed - Valid encounter within last 12 months    Recent Outpatient Visits           6 months ago Controlled type 2 diabetes mellitus with hyperglycemia, without long-term current use of insulin [place]   [place]   7 months ago [facility] respiratory tract infection, unspecified type   [facility]   9 months ago Controlled type 2 diabetes mellitus with hyperglycemia, without long-term current use of insulin ([place]   [place], [place]   1 year ago Controlled type 2 diabetes mellitus with hyperglycemia, without long-term current use of insulin ([place]   [place] P, DO   1 year ago Closed fracture of multiple ribs of right side with routine healing, subsequent encounter   [place] P, DO  amLODipine (NORVASC) 5 MG tablet [Pharmacy Med Name: amLODIPine BESYLATE 5 MG TAB] 90 tablet 0    Sig: TAKE ONE TABLET BY MOUTH DAILY     Cardiovascular:  Calcium Channel Blockers Failed - 01/12/2022  1:19 PM      Failed - Last BP in normal range    BP Readings from Last 1 Encounters:  07/11/21 140/60          Failed - Valid encounter within last 6 months    Recent Outpatient Visits            6 months ago Controlled type 2 diabetes mellitus with hyperglycemia, without long-term current use of insulin ([place])   [place], Megan P, DO   7 months ago Upper respiratory tract infection, unspecified type   [facility] Jon Billings, NP   9 months ago Controlled type 2 diabetes mellitus with hyperglycemia, without long-term current use of insulin ([place])   [place], Megan P, DO   1 year ago Controlled type 2 diabetes mellitus with hyperglycemia, without long-term current use of insulin ([place])   [place], Megan P, DO   1 year ago Closed fracture of multiple ribs of right side with routine healing, subsequent encounter   [facility], Megan P, DO               metoprolol tartrate (LOPRESSOR) 50 MG tablet [Pharmacy Med Name: METOPROLOL TARTRATE 50 MG TAB] 90 tablet 0    Sig: TAKE ONE TABLET BY MOUTH DAILY     Cardiovascular:  Beta Blockers Failed - 01/12/2022  1:19 PM      Failed - Last BP in normal range    BP Readings from Last 1 Encounters:  07/11/21 140/60          Failed - Valid encounter within last 6 months    Recent Outpatient Visits           6 months ago Controlled type 2 diabetes mellitus with hyperglycemia, without long-term current use of insulin ([place])   [place], Megan P, DO   7 months ago Upper respiratory tract infection, unspecified type   [facility] Jon Billings, NP   9 months ago Controlled type 2 diabetes mellitus with hyperglycemia, without long-term current use of insulin ([place])   [place], Megan P, DO   1 year ago Controlled type 2 diabetes mellitus with hyperglycemia, without long-term current use of insulin [facility])   [place], Megan P, DO   1 year ago Closed fracture of multiple ribs of right side with routine healing, subsequent encounter   [place], Megan P, DO              Passed - Last Heart Rate in normal range    Pulse Readings from Last 1 Encounters:  07/11/21 78

## 2022-01-13 NOTE — Telephone Encounter (Signed)
Patient is overdue for appointment. Please call to schedule.  

## 2022-01-13 NOTE — Telephone Encounter (Signed)
Tried to call pt to schedule appt. Bad phone number.

## 2022-01-14 NOTE — Telephone Encounter (Signed)
2nd attempt- bad phone number

## 2022-01-15 ENCOUNTER — Other Ambulatory Visit: Payer: Self-pay | Admitting: Family Medicine

## 2022-01-15 DIAGNOSIS — E782 Mixed hyperlipidemia: Secondary | ICD-10-CM

## 2022-01-15 DIAGNOSIS — I110 Hypertensive heart disease with heart failure: Secondary | ICD-10-CM

## 2022-01-15 NOTE — Telephone Encounter (Signed)
Pt has made appointment for Thurs, 2/02 at 2:40 pm.

## 2022-01-15 NOTE — Telephone Encounter (Signed)
Requested medications are due for refill today.  yes  Requested medications are on the active medications list.  yes  Last refill. Amlodipine  - 10/07/2021, Lovastatin 03/27/2021, metoprolol - 10/19/2021, metformin 10/07/2021  Future visit scheduled.   Yes!!  Notes to clinic.  Medications were denied d/t no future appointment. Pt now has upcoming appointment scheduled.    Requested Prescriptions  Pending Prescriptions Disp Refills   amLODipine (NORVASC) 5 MG tablet [Pharmacy Med Name: amLODIPine BESYLATE 5 MG TAB] 90 tablet 0    Sig: TAKE ONE TABLET BY MOUTH DAILY     Cardiovascular:  Calcium Channel Blockers Failed - 01/15/2022  1:04 PM      Failed - Last BP in normal range    BP Readings from Last 1 Encounters:  07/11/21 140/60          Failed - Valid encounter within last 6 months    Recent Outpatient Visits           6 months ago Controlled type 2 diabetes mellitus with hyperglycemia, without long-term current use of insulin ([place])   [place], Megan P, DO   7 months ago Upper respiratory tract infection, unspecified type   [facility] Jon Billings, NP   9 months ago Controlled type 2 diabetes mellitus with hyperglycemia, without long-term current use of insulin ([place])   [place], Megan P, DO   1 year ago Controlled type 2 diabetes mellitus with hyperglycemia, without long-term current use of insulin ([place])   [place], Megan P, DO   1 year ago Closed fracture of multiple ribs of right side with routine healing, subsequent encounter   [place], Megan P, DO       Future Appointments             In 1 week [place], Megan P, DO [place]             lovastatin (MEVACOR) 20 MG tablet [Pharmacy Med Name: LOVASTATIN 20 MG TABLET] 180 tablet 1    Sig: TAKE TWO TABLETS BY MOUTH EVERY NIGHT AT BEDTIME.     Cardiovascular:  Antilipid - Statins  Failed - 01/15/2022  1:04 PM      Failed - HDL in normal range and within 360 days    HDL  Date Value Ref Range Status  07/08/2021 37 (L) >39 mg/dL Final          Failed - Triglycerides in normal range and within 360 days    Triglycerides  Date Value Ref Range Status  07/08/2021 309 (H) 0 - 149 mg/dL Final          Passed - Total Cholesterol in normal range and within 360 days    Cholesterol, Total  Date Value Ref Range Status  07/08/2021 177 100 - 199 mg/dL Final          Passed - LDL in normal range and within 360 days    LDL Chol Calc (NIH)  Date Value Ref Range Status  07/08/2021 89 0 - 99 mg/dL Final          Passed - Patient is not pregnant      Passed - Valid encounter within last 12 months    Recent Outpatient Visits           6 months ago Controlled type 2 diabetes mellitus with hyperglycemia, without long-term current use of insulin ([place])   [place],  Megan P, DO   7 months ago Upper respiratory tract infection, unspecified type   [facility] Jon Billings, NP   9 months ago Controlled type 2 diabetes mellitus with hyperglycemia, without long-term current use of insulin ([place])   [place], Megan P, DO   1 year ago Controlled type 2 diabetes mellitus with hyperglycemia, without long-term current use of insulin ([place])   [place], Megan P, DO   1 year ago Closed fracture of multiple ribs of right side with routine healing, subsequent encounter   [facility] [name], DO       Future Appointments             In 1 week [place], Megan P, DO [employer]             metoprolol tartrate (LOPRESSOR) 50 MG tablet [Pharmacy Med Name: METOPROLOL TARTRATE 50 MG TAB] 90 tablet 0    Sig: TAKE ONE TABLET BY MOUTH DAILY     Cardiovascular:  Beta Blockers Failed - 01/15/2022  1:04 PM      Failed - Last BP in normal range    BP Readings from  Last 1 Encounters:  07/11/21 140/60          Failed - Valid encounter within last 6 months    Recent Outpatient Visits           6 months ago Controlled type 2 diabetes mellitus with hyperglycemia, without long-term current use of insulin ([place])   [place], Megan P, DO   7 months ago Upper respiratory tract infection, unspecified type   [facility] Jon Billings, NP   9 months ago Controlled type 2 diabetes mellitus with hyperglycemia, without long-term current use of insulin ([place])   [place], Megan P, DO   1 year ago Controlled type 2 diabetes mellitus with hyperglycemia, without long-term current use of insulin ([place])   [place], Megan P, DO   1 year ago Closed fracture of multiple ribs of right side with routine healing, subsequent encounter   [place], Megan P, DO       Future Appointments             In 1 week [place], Megan P, DO [place]            Passed - Last Heart Rate in normal range    Pulse Readings from Last 1 Encounters:  07/11/21 78           metFORMIN (GLUCOPHAGE) 500 MG tablet [Pharmacy Med Name: metFORMIN HCL 500 MG TABLET] 360 tablet 0    Sig: TAKE TWO TABLETS BY MOUTH TWICE A DAY WITH FOOD     Endocrinology:  Diabetes - Biguanides Failed - 01/15/2022  1:04 PM      Failed - HBA1C is between 0 and 7.9 and within 180 days    HB A1C (BAYER DCA - WAIVED)  Date Value Ref Range Status  07/08/2021 7.3 (H) <7.0 % Final    Comment:                                          Diabetic Adult            <7.0  Healthy Adult        4.3 - 5.7                                                           (DCCT/NGSP) American Diabetes Association's Summary of Glycemic Recommendations for Adults with Diabetes: Hemoglobin A1c <7.0%. More stringent glycemic goals (A1c <6.0%) may further reduce  complications at the cost of increased risk of hypoglycemia.           Failed - Valid encounter within last 6 months    Recent Outpatient Visits           6 months ago Controlled type 2 diabetes mellitus with hyperglycemia, without long-term current use of insulin ([place])   [place], Megan P, DO   7 months ago Upper respiratory tract infection, unspecified type   [facility] Jon Billings, NP   9 months ago Controlled type 2 diabetes mellitus with hyperglycemia, without long-term current use of insulin ([place])   [place], Megan P, DO   1 year ago Controlled type 2 diabetes mellitus with hyperglycemia, without long-term current use of insulin ([place])   [place], Megan P, DO   1 year ago Closed fracture of multiple ribs of right side with routine healing, subsequent encounter   [place], Megan P, DO       Future Appointments             In 1 week Johnson, Megan P, DO [place]            Passed - Cr in normal range and within 360 days    Creatinine  Date Value Ref Range Status  06/02/2012 1.10 0.60 - 1.30 mg/dL Final   Creatinine, Ser  Date Value Ref Range Status  07/08/2021 1.24 0.76 - 1.27 mg/dL Final          Passed - eGFR in normal range and within 360 days    EGFR (African American)  Date Value Ref Range Status  06/02/2012 >60  Final   GFR calc Af Amer  Date Value Ref Range Status  10/08/2020 81 >59 mL/min/1.73 Final    Comment:    **In accordance with recommendations from the NKF-ASN Task force,**   [facility] is in the process of updating its eGFR calculation to the   2021 CKD-EPI creatinine equation that estimates kidney function   without a race variable.    EGFR (Non-African Amer.)  Date Value Ref Range Status  06/02/2012 >60  Final    Comment:    eGFR values <12m/min/1.73 m2 may be an indication of chronic kidney disease  (CKD). Calculated eGFR is useful in patients with stable renal function. The eGFR calculation will not be reliable in acutely ill patients when serum creatinine is changing rapidly. It is not useful in  patients on dialysis. The eGFR calculation may not be applicable to patients at the low and high extremes of body sizes, pregnant women, and vegetarians.    GFR calc non Af Amer  Date Value Ref Range Status  10/08/2020 70 >59 mL/min/1.73 Final   eGFR  Date Value Ref Range Status  07/08/2021 65 >59 mL/min/1.73 Final

## 2022-01-22 ENCOUNTER — Ambulatory Visit: Payer: Medicare HMO | Admitting: Family Medicine

## 2022-01-30 ENCOUNTER — Other Ambulatory Visit: Payer: Self-pay

## 2022-01-30 ENCOUNTER — Encounter: Payer: Self-pay | Admitting: Family Medicine

## 2022-01-30 ENCOUNTER — Ambulatory Visit (INDEPENDENT_AMBULATORY_CARE_PROVIDER_SITE_OTHER): Payer: Medicare HMO | Admitting: Family Medicine

## 2022-01-30 VITALS — BP 168/78 | HR 66 | Temp 97.9°F | Wt 182.0 lb

## 2022-01-30 DIAGNOSIS — E782 Mixed hyperlipidemia: Secondary | ICD-10-CM

## 2022-01-30 DIAGNOSIS — I739 Peripheral vascular disease, unspecified: Secondary | ICD-10-CM

## 2022-01-30 DIAGNOSIS — R69 Illness, unspecified: Secondary | ICD-10-CM | POA: Diagnosis not present

## 2022-01-30 DIAGNOSIS — I701 Atherosclerosis of renal artery: Secondary | ICD-10-CM

## 2022-01-30 DIAGNOSIS — R3911 Hesitancy of micturition: Secondary | ICD-10-CM

## 2022-01-30 DIAGNOSIS — E1159 Type 2 diabetes mellitus with other circulatory complications: Secondary | ICD-10-CM | POA: Diagnosis not present

## 2022-01-30 DIAGNOSIS — E1165 Type 2 diabetes mellitus with hyperglycemia: Secondary | ICD-10-CM

## 2022-01-30 DIAGNOSIS — Z1211 Encounter for screening for malignant neoplasm of colon: Secondary | ICD-10-CM

## 2022-01-30 DIAGNOSIS — I70219 Atherosclerosis of native arteries of extremities with intermittent claudication, unspecified extremity: Secondary | ICD-10-CM | POA: Diagnosis not present

## 2022-01-30 DIAGNOSIS — E785 Hyperlipidemia, unspecified: Secondary | ICD-10-CM | POA: Diagnosis not present

## 2022-01-30 DIAGNOSIS — I152 Hypertension secondary to endocrine disorders: Secondary | ICD-10-CM

## 2022-01-30 DIAGNOSIS — Z0000 Encounter for general adult medical examination without abnormal findings: Secondary | ICD-10-CM

## 2022-01-30 DIAGNOSIS — F332 Major depressive disorder, recurrent severe without psychotic features: Secondary | ICD-10-CM | POA: Diagnosis not present

## 2022-01-30 DIAGNOSIS — E1169 Type 2 diabetes mellitus with other specified complication: Secondary | ICD-10-CM | POA: Diagnosis not present

## 2022-01-30 DIAGNOSIS — I110 Hypertensive heart disease with heart failure: Secondary | ICD-10-CM

## 2022-01-30 DIAGNOSIS — I25119 Atherosclerotic heart disease of native coronary artery with unspecified angina pectoris: Secondary | ICD-10-CM

## 2022-01-30 LAB — BAYER DCA HB A1C WAIVED: HB A1C (BAYER DCA - WAIVED): 7.2 % — ABNORMAL HIGH (ref 4.8–5.6)

## 2022-01-30 LAB — URINALYSIS, ROUTINE W REFLEX MICROSCOPIC
Bilirubin, UA: NEGATIVE
Glucose, UA: NEGATIVE
Ketones, UA: NEGATIVE
Leukocytes,UA: NEGATIVE
Nitrite, UA: NEGATIVE
RBC, UA: NEGATIVE
Specific Gravity, UA: >1.03 — ABNORMAL HIGH (ref 1.005–1.030)
Urobilinogen, Ur: 0.2 mg/dL (ref 0.2–1.0)
pH, UA: 6 (ref 5.0–7.5)

## 2022-01-30 LAB — MICROALBUMIN, URINE WAIVED
Creatinine, Urine Waived: 200 mg/dL (ref 10–300)
Microalb, Ur Waived: 150 mg/L — ABNORMAL HIGH (ref 0–19)

## 2022-01-30 LAB — MICROSCOPIC EXAMINATION
Bacteria, UA: NONE SEEN
Epithelial Cells (non renal): NONE SEEN /hpf (ref 0–10)
RBC, Urine: NONE SEEN /hpf (ref 0–2)
WBC, UA: NONE SEEN /hpf (ref 0–5)

## 2022-01-30 MED ORDER — LISINOPRIL 40 MG PO TABS: 40.0000 mg | ORAL_TABLET | Freq: Every day | ORAL | 1 refills | Status: DC

## 2022-01-30 MED ORDER — NITROGLYCERIN 0.4 MG SL SUBL: 0.4000 mg | SUBLINGUAL_TABLET | SUBLINGUAL | 1 refills | Status: DC | PRN

## 2022-01-30 MED ORDER — METOPROLOL TARTRATE 50 MG PO TABS: 50.0000 mg | ORAL_TABLET | Freq: Every day | ORAL | 1 refills | Status: DC

## 2022-01-30 MED ORDER — LOVASTATIN 20 MG PO TABS: 20.0000 mg | ORAL_TABLET | Freq: Every day | ORAL | 1 refills | Status: DC

## 2022-01-30 MED ORDER — METFORMIN HCL 500 MG PO TABS
500.0000 mg | ORAL_TABLET | Freq: Every day | ORAL | 1 refills | Status: DC
Start: 2022-01-30 — End: 2022-04-09

## 2022-01-30 MED ORDER — DAPAGLIFLOZIN PROPANEDIOL 10 MG PO TABS: 10.0000 mg | ORAL_TABLET | Freq: Every day | ORAL | 3 refills | Status: DC

## 2022-01-30 MED ORDER — AMLODIPINE BESYLATE 5 MG PO TABS: 5.0000 mg | ORAL_TABLET | Freq: Every day | ORAL | 1 refills | Status: DC

## 2022-01-30 NOTE — Progress Notes (Signed)
BP (!) 168/78    Pulse 66    Temp 97.9 F (36.6 C)    Wt 182 lb (82.6 kg)    SpO2 95%    BMI 25.38 kg/m    Subjective:    Patient ID: Lucas Weiss, male    DOB: Aug 03, 1956, 66 y.o.   MRN: [id number]  HPI: Lucas Weiss is a 66 y.o. male presenting on 01/30/2022 for comprehensive medical examination. Current medical complaints include:  HYPERTENSION / HYPERLIPIDEMIA- has not been taking his medicine for about 3 weeks Satisfied with current treatment? yes Duration of hypertension: chronic BP monitoring frequency: not checking BP medication side effects: none Duration of hyperlipidemia: chronic Cholesterol medication side effects: no Cholesterol supplements: none Past cholesterol medications: atorvastatin Medication compliance: fair compliance Aspirin: yes Recent stressors: no Recurrent headaches: no Visual changes: no Palpitations: no Dyspnea: no Chest pain: no Lower extremity edema: no Dizzy/lightheaded: no  DIABETES Hypoglycemic episodes:no Polydipsia/polyuria: no Visual disturbance: no Chest pain: no Paresthesias: no Glucose Monitoring: no  Accucheck frequency: Not Checking Taking Insulin?: no Blood Pressure Monitoring: not checking Retinal Examination: Not up to Date Foot Exam: Up to Date Diabetic Education: Completed Pneumovax: Up to Date Influenza: Up to Date Aspirin: yes  Interim Problems from his last visit: no  Depression Screen done today and results listed below:  Depression screen [facility] 2/9 01/30/2022 08/27/2021 01/09/2020 07/31/2019 02/14/2019  Decreased Interest 0 0 0 0 0  Down, Depressed, Hopeless 0 0 0 0 0  PHQ - 2 Score 0 0 0 0 0  Altered sleeping - - - - -  Tired, decreased energy - - - - -  Change in appetite - - - - -  Feeling bad or failure about yourself  - - - - -  Trouble concentrating - - - - -  Moving slowly or fidgety/restless - - - - -  Suicidal thoughts - - - - -  PHQ-9 Score - - - - -  Difficult doing work/chores - - - - -  Some  recent data might be hidden    Past Medical History:  Past Medical History:  Diagnosis Date   Alcohol abuse, in remission    Avascular necrosis of bone of left hip ([place]) 10/15   Total hip replacement recommended by ortho, vascular says it's OK   Avascular necrosis of bone of left hip (HCC)    Total hip replacement recommended by ortho, vascular says he can have it done.     Bleeding hemorrhoids    Broken ribs 09/27/2020   CHF (congestive heart failure) (HCC)    Chronic low back pain    Complication of anesthesia    "woke up twice during procedures @ [facility]" (09/15/2012)   Coronary artery disease    Diabetes mellitus without complication ([place]) 3/82/50   A1c 6.8   Diverticulosis of sigmoid colon    DVT (deep venous thrombosis) (HCC)    Dyslexia    H/O hiatal hernia    Head injury, acute, with loss of consciousness ([place])    as a child   High cholesterol    Hypertension    Impaired fasting glucose    Last A1c 6.1   Myocardial infarction [facility])    Neuropathy    PAD (peripheral artery disease) ([place])    07/2012:  right ABI 0.64, left ABI 0.57.   Pneumonia    history of    Primary osteoarthritis of left hip    Renal artery stenosis ([place])  Bilateral    Severe recurrent major depression without psychotic features (HCC)    Shortness of breath dyspnea    with exertion - due to pain   Stroke [facility]) 2009   "after CABG; ~ blind left eye since" (09/15/2012)- memory loss    Tobacco abuse    Quit in 2009    Surgical History:  Past Surgical History:  Procedure Laterality Date   2D Echo  02/17/11   EF 50-55%, mild LVH, No WMA, mild MR, mild TR   CARDIAC CATHETERIZATION  ?2009   CARDIOVASCULAR STRESS TEST  08/10/12   Lexiscan: EF 70%, Attenuation artifact in inferior region.  No ischemia or infarct in remaining myocardium. Low risk   COLONOSCOPY W/ POLYPECTOMY     "no cancer"   CORONARY ARTERY BYPASS GRAFT  2009   2009, CABG X5, LIMA to LAD, SVG to Diagnonal, Sequential SVG to OM  2-3, SVG to PDA; Dr. Cyndia Bent   INSERTION OF ILIAC STENT Left 09/15/2012   Procedure: INSERTION OF ILIAC STENT;  Surgeon: Lorretta Harp, MD;  Location: [facility] CATH LAB;  Service: Cardiovascular;  Laterality: Left;  lt iliac stent   LOWER EXTREMITY ANGIOGRAM N/A 09/15/2012   Procedure: LOWER EXTREMITY ANGIOGRAM;  Surgeon: Lorretta Harp, MD;  Location: [facility] CATH LAB;  Service: Cardiovascular;  Laterality: N/A;   PERIPHERAL ARTERIAL STENT GRAFT Left 06/20/2012; ~ 07/2012; 09/15/2012   left; left; left   PV angiogram  09/15/12   restenting of left common iliac for ISRS.  Bilateral occluded SFAs.  Renal artery stenosis: 70% right inferior renal artery, 90% left inferior renal artery stenosis.    TOTAL HIP ARTHROPLASTY Left 05/19/2016   Procedure: LEFT TOTAL HIP ARTHROPLASTY ANTERIOR APPROACH;  Surgeon: [name], MD;  Location: [place];  Service: Orthopedics;  Laterality: Left;    Medications:  Current Outpatient Medications on File Prior to Visit  Medication Sig   aspirin EC 81 MG tablet Take 81 mg by mouth daily.   Blood Glucose Monitoring Suppl (ONETOUCH VERIO) w/Device KIT To check blood sugar twice a day and document, bring to provider visits.   gabapentin (NEURONTIN) 300 MG capsule Take 300 mg by mouth 3 (three) times daily.   glucose blood (ONETOUCH VERIO) test strip To check blood sugar twice a day and document, bring to provider visits.   Lancets (ONETOUCH ULTRASOFT) lancets To check blood sugar twice a day and document, bring to provider visits.   QUEtiapine (SEROQUEL) 25 MG tablet Take 1 tablet (25 mg total) by mouth at bedtime.   No current facility-administered medications on file prior to visit.    Allergies:  No Known Allergies  Social History:  Social History   Socioeconomic History   Marital status: Divorced    Spouse name: Not on file   Number of children: Not on file   Years of education: Not on file   Highest education level: 11th grade  Occupational History    Occupation: disability  Tobacco Use   Smoking status: Former    Packs/day: 1.50    Years: 30.00    Pack years: 45.00    Types: Cigarettes    Quit date: 07/19/2008    Years since quitting: 13.5   Smokeless tobacco: Never  Vaping Use   Vaping Use: Never used  Substance and Sexual Activity   Alcohol use: No    Comment: Former Alcoholic, not drinking anymore. 09/15/2012 "3-4 beers; shot of crown @ least 2 nights/wk"   Drug use: No  Sexual activity: Not Currently  Other Topics Concern   Not on file  Social History Narrative   Not on file   Social Determinants of Health   Financial Resource Strain: Medium Risk   Difficulty of Paying Living Expenses: Somewhat hard  Food Insecurity: No Food Insecurity   Worried About Running Out of Food in the Last Year: Never true   Ran Out of Food in the Last Year: Never true  Transportation Needs: No Transportation Needs   Lack of Transportation (Medical): No   Lack of Transportation (Non-Medical): No  Physical Activity: Inactive   Days of Exercise per Week: 0 days   Minutes of Exercise per Session: 0 min  Stress: No Stress Concern Present   Feeling of Stress : Only a little  Social Connections: Moderately Isolated   Frequency of Communication with Friends and Family: More than three times a week   Frequency of Social Gatherings with Friends and Family: More than three times a week   Attends Religious Services: More than 4 times per year   Active Member of [employer] or Organizations: No   Attends Archivist Meetings: Never   Marital Status: Divorced  Human resources officer Violence: Not At Risk   Fear of Current or Ex-Partner: No   Emotionally Abused: No   Physically Abused: No   Sexually Abused: No   Social History   Tobacco Use  Smoking Status Former   Packs/day: 1.50   Years: 30.00   Pack years: 45.00   Types: Cigarettes   Quit date: 07/19/2008   Years since quitting: 13.5  Smokeless Tobacco Never   Social History    Substance and Sexual Activity  Alcohol Use No   Comment: Former Alcoholic, not drinking anymore. 09/15/2012 "3-4 beers; shot of crown @ least 2 nights/wk"    Family History:  Family History  Problem Relation Age of Onset   Diabetes Mother    Heart disease Mother    Hyperlipidemia Mother    Hypertension Mother    Heart attack Mother    Thyroid disease Mother    Hyperlipidemia Father    Hypertension Father    Heart attack Father    Heart disease Father        before age 30   Alcohol abuse Father    Lung disease Father    Diabetes Sister    Hyperlipidemia Sister    Hypertension Sister    Cancer Sister    Heart disease Sister    Hypertension Sister    Hyperlipidemia Sister    Heart disease Brother        before age 24   Hyperlipidemia Brother    Hypertension Brother    Heart attack Brother    Diabetes Brother    Heart disease Brother    Hyperlipidemia Brother    Hypertension Brother    Stroke Brother    Heart attack Brother     Past medical history, surgical history, medications, allergies, family history and social history reviewed with patient today and changes made to appropriate areas of the chart.   Review of Systems  Constitutional: Negative.   HENT: Negative.    Eyes: Negative.   Respiratory: Negative.    Cardiovascular: Negative.   Gastrointestinal: Negative.   Genitourinary: Negative.   Musculoskeletal:  Positive for back pain. Negative for falls, joint pain, myalgias and neck pain.  Skin: Negative.   Neurological: Negative.   Endo/Heme/Allergies: Negative.   Psychiatric/Behavioral: Negative.    All other ROS negative except  what is listed above and in the HPI.      Objective:    BP (!) 168/78    Pulse 66    Temp 97.9 F (36.6 C)    Wt 182 lb (82.6 kg)    SpO2 95%    BMI 25.38 kg/m   Wt Readings from Last 3 Encounters:  01/30/22 182 lb (82.6 kg)  07/11/21 178 lb (80.7 kg)  07/08/21 178 lb (80.7 kg)    Physical Exam Vitals and nursing note  reviewed.  Constitutional:      General: He is not in acute distress.    Appearance: Normal appearance. He is obese. He is not ill-appearing, toxic-appearing or diaphoretic.  HENT:     Head: Normocephalic and atraumatic.     Right Ear: Tympanic membrane, ear canal and external ear normal. There is no impacted cerumen.     Left Ear: Tympanic membrane, ear canal and external ear normal. There is no impacted cerumen.     Nose: Nose normal. No congestion or rhinorrhea.     Mouth/Throat:     Mouth: Mucous membranes are moist.     Pharynx: Oropharynx is clear. No oropharyngeal exudate or posterior oropharyngeal erythema.  Eyes:     General: No scleral icterus.       Right eye: No discharge.        Left eye: No discharge.     Extraocular Movements: Extraocular movements intact.     Conjunctiva/sclera: Conjunctivae normal.     Pupils: Pupils are equal, round, and reactive to light.  Neck:     Vascular: No carotid bruit.  Cardiovascular:     Rate and Rhythm: Normal rate and regular rhythm.     Pulses: Normal pulses.     Heart sounds: No murmur heard.   No friction rub. No gallop.  Pulmonary:     Effort: Pulmonary effort is normal. No respiratory distress.     Breath sounds: Normal breath sounds. No stridor. No wheezing, rhonchi or rales.  Chest:     Chest wall: No tenderness.  Abdominal:     General: Abdomen is flat. Bowel sounds are normal. There is no distension.     Palpations: Abdomen is soft. There is no mass.     Tenderness: There is no abdominal tenderness. There is no right CVA tenderness, left CVA tenderness, guarding or rebound.     Hernia: No hernia is present.  Genitourinary:    Comments: Genital exam deferred with shared decision making Musculoskeletal:        General: No swelling, tenderness, deformity or signs of injury. Normal range of motion.     Cervical back: Normal range of motion and neck supple. No rigidity. No muscular tenderness.     Right lower leg: No edema.      Left lower leg: No edema.  Lymphadenopathy:     Cervical: No cervical adenopathy.  Skin:    General: Skin is warm and dry.     Capillary Refill: Capillary refill takes less than 2 seconds.     Coloration: Skin is not jaundiced or pale.     Findings: No bruising, erythema, lesion or rash.  Neurological:     General: No focal deficit present.     Mental Status: He is alert and oriented to person, place, and time.     Cranial Nerves: No cranial nerve deficit.     Sensory: No sensory deficit.     Motor: No weakness.     Coordination: Coordination  normal.     Gait: Gait normal.     Deep Tendon Reflexes: Reflexes normal.  Psychiatric:        Mood and Affect: Mood normal.        Behavior: Behavior normal.        Thought Content: Thought content normal.        Judgment: Judgment normal.    Results for orders placed or performed in visit on 01/30/22  Microscopic Examination   Urine  Result Value Ref Range   WBC, UA None seen 0 - 5 /hpf   RBC None seen 0 - 2 /hpf   Epithelial Cells (non renal) None seen 0 - 10 /hpf   Mucus, UA Present (A) Not Estab.   Bacteria, UA None seen None seen/Few  Urinalysis, Routine w reflex microscopic  Result Value Ref Range   Specific Gravity, UA >1.030 (H) 1.005 - 1.030   pH, UA 6.0 5.0 - 7.5   Color, UA Yellow Yellow   Appearance Ur Clear Clear   Leukocytes,UA Negative Negative   Protein,UA 3+ (A) Negative/Trace   Glucose, UA Negative Negative   Ketones, UA Negative Negative   RBC, UA Negative Negative   Bilirubin, UA Negative Negative   Urobilinogen, Ur 0.2 0.2 - 1.0 mg/dL   Nitrite, UA Negative Negative   Microscopic Examination See below:   Microalbumin, Urine Waived  Result Value Ref Range   Microalb, Ur Waived 150 (H) 0 - 19 mg/L   Creatinine, Urine Waived 200 10 - 300 mg/dL   Microalb/Creat Ratio 30-300 (H) <30 mg/g  Bayer DCA Hb A1c Waived  Result Value Ref Range   HB A1C (BAYER DCA - WAIVED) 7.2 (H) 4.8 - 5.6 %       Assessment & Plan:   Problem List Items Addressed This Visit       Cardiovascular and Mediastinum   CAD (coronary artery disease)    BP not under good control. Has been off his meds. Will restart and recheck in 1 month.       Relevant Medications   amLODipine (NORVASC) 5 MG tablet   lisinopril (ZESTRIL) 40 MG tablet   lovastatin (MEVACOR) 20 MG tablet   metoprolol tartrate (LOPRESSOR) 50 MG tablet   nitroGLYCERIN (NITROSTAT) 0.4 MG SL tablet   PAD (peripheral artery disease): Left common iliac stent plus additional stent(09/15/12) for ISRS.    BP not under good control. Has been off his meds. Will restart and recheck in 1 month.       Relevant Medications   amLODipine (NORVASC) 5 MG tablet   lisinopril (ZESTRIL) 40 MG tablet   lovastatin (MEVACOR) 20 MG tablet   metoprolol tartrate (LOPRESSOR) 50 MG tablet   nitroGLYCERIN (NITROSTAT) 0.4 MG SL tablet   Hypertensive heart failure (HCC)    BP not under good control. Has been off his meds. Will restart and recheck in 1 month.       Relevant Medications   amLODipine (NORVASC) 5 MG tablet   lisinopril (ZESTRIL) 40 MG tablet   lovastatin (MEVACOR) 20 MG tablet   metoprolol tartrate (LOPRESSOR) 50 MG tablet   nitroGLYCERIN (NITROSTAT) 0.4 MG SL tablet   Other Relevant Orders   CBC with Differential/Platelet   Lipid Panel w/o Chol/HDL Ratio   TSH   Renal artery stenosis, native, bilateral (HCC)    Not under good control. Has been off his meds. Will restart and recheck in 1 month.       Relevant Medications  amLODipine (NORVASC) 5 MG tablet   lisinopril (ZESTRIL) 40 MG tablet   lovastatin (MEVACOR) 20 MG tablet   metoprolol tartrate (LOPRESSOR) 50 MG tablet   nitroGLYCERIN (NITROSTAT) 0.4 MG SL tablet   Other Relevant Orders   CBC with Differential/Platelet   Lipid Panel w/o Chol/HDL Ratio   Atherosclerotic PVD with intermittent claudication (HCC)    BP not under good control. Has been off his meds. Will restart and  recheck in 1 month.       Relevant Medications   amLODipine (NORVASC) 5 MG tablet   lisinopril (ZESTRIL) 40 MG tablet   lovastatin (MEVACOR) 20 MG tablet   metoprolol tartrate (LOPRESSOR) 50 MG tablet   nitroGLYCERIN (NITROSTAT) 0.4 MG SL tablet   Other Relevant Orders   CBC with Differential/Platelet   Lipid Panel w/o Chol/HDL Ratio   Hypertension associated with diabetes (HCC)    BP not under good control. Has been off his meds. Will restart and recheck in 1 month.       Relevant Medications   amLODipine (NORVASC) 5 MG tablet   dapagliflozin propanediol (FARXIGA) 10 MG TABS tablet   lisinopril (ZESTRIL) 40 MG tablet   lovastatin (MEVACOR) 20 MG tablet   metFORMIN (GLUCOPHAGE) 500 MG tablet   metoprolol tartrate (LOPRESSOR) 50 MG tablet   nitroGLYCERIN (NITROSTAT) 0.4 MG SL tablet   Other Relevant Orders   CBC with Differential/Platelet   Lipid Panel w/o Chol/HDL Ratio     Endocrine   Hyperlipidemia associated with type 2 diabetes mellitus ([place])    Under good control on current regimen. Continue current regimen. Continue to monitor. Call with any concerns. Refills given. Labs drawn today.       Relevant Medications   amLODipine (NORVASC) 5 MG tablet   dapagliflozin propanediol (FARXIGA) 10 MG TABS tablet   lisinopril (ZESTRIL) 40 MG tablet   lovastatin (MEVACOR) 20 MG tablet   metFORMIN (GLUCOPHAGE) 500 MG tablet   metoprolol tartrate (LOPRESSOR) 50 MG tablet   nitroGLYCERIN (NITROSTAT) 0.4 MG SL tablet   Other Relevant Orders   CBC with Differential/Platelet   Lipid Panel w/o Chol/HDL Ratio   Controlled type 2 diabetes mellitus (HCC)    Stable with a1c of 7.2. Continue current regimen. Continue to monitor. Call with any concerns.       Relevant Medications   dapagliflozin propanediol (FARXIGA) 10 MG TABS tablet   lisinopril (ZESTRIL) 40 MG tablet   lovastatin (MEVACOR) 20 MG tablet   metFORMIN (GLUCOPHAGE) 500 MG tablet   Other Relevant Orders   Comprehensive  metabolic panel   CBC with Differential/Platelet   Lipid Panel w/o Chol/HDL Ratio   Urinalysis, Routine w reflex microscopic (Completed)   Microalbumin, Urine Waived (Completed)   Bayer DCA Hb A1c Waived (Completed)     Other   Severe recurrent major depression (HCC)    Stable. Refuses Phq9. Continue to monitor.       Relevant Orders   CBC with Differential/Platelet   Lipid Panel w/o Chol/HDL Ratio   TSH   Other Visit Diagnoses     Routine general medical examination at a health care facility    -  Primary   Vaccines up to date. Screening labs checked today. Cologuard ordered. Continue diet and exercise. Call with any concerns.    Hesitancy       Labs drawn today. Await results.    Relevant Orders   PSA   Mixed hyperlipidemia       Relevant Medications   amLODipine (  NORVASC) 5 MG tablet   lisinopril (ZESTRIL) 40 MG tablet   lovastatin (MEVACOR) 20 MG tablet   metoprolol tartrate (LOPRESSOR) 50 MG tablet   nitroGLYCERIN (NITROSTAT) 0.4 MG SL tablet   Screening for colon cancer       Cologuard ordered.    Relevant Orders   Cologuard        Discussed aspirin prophylaxis for myocardial infarction prevention and decision was made to continue ASA  LABORATORY TESTING:  Health maintenance labs ordered today as discussed above.   The natural history of prostate cancer and ongoing controversy regarding screening and potential treatment outcomes of prostate cancer has been discussed with the patient. The meaning of a false positive PSA and a false negative PSA has been discussed. He indicates understanding of the limitations of this screening test and wishes to proceed with screening PSA testing.   IMMUNIZATIONS:   - Tdap: Tetanus vaccination status reviewed: last tetanus booster within 10 years. - Influenza: Up to date - Pneumovax: Up to date - Prevnar: Not applicable - COVID: Refused - HPV: Not applicable - Shingrix vaccine: Given elsewhere  SCREENING: - Colonoscopy:  cologuard ordered today  Discussed with patient purpose of the colonoscopy is to detect colon cancer at curable precancerous or early stages   PATIENT COUNSELING:    Sexuality: Discussed sexually transmitted diseases, partner selection, use of condoms, avoidance of unintended pregnancy  and contraceptive alternatives.   Advised to avoid cigarette smoking.  I discussed with the patient that most people either abstain from alcohol or drink within safe limits (<=14/week and <=4 drinks/occasion for males, <=7/weeks and <= 3 drinks/occasion for females) and that the risk for alcohol disorders and other health effects rises proportionally with the number of drinks per week and how often a drinker exceeds daily limits.  Discussed cessation/primary prevention of drug use and availability of treatment for abuse.   Diet: Encouraged to adjust caloric intake to maintain  or achieve ideal body weight, to reduce intake of dietary saturated fat and total fat, to limit sodium intake by avoiding high sodium foods and not adding table salt, and to maintain adequate dietary potassium and calcium preferably from fresh fruits, vegetables, and low-fat dairy products.    stressed the importance of regular exercise  Injury prevention: Discussed safety belts, safety helmets, smoke detector, smoking near bedding or upholstery.   Dental health: Discussed importance of regular tooth brushing, flossing, and dental visits.   Follow up plan: NEXT PREVENTATIVE PHYSICAL DUE IN 1 YEAR. Return in about 4 weeks (around 02/27/2022) for Follow up BP.

## 2022-01-30 NOTE — Assessment & Plan Note (Signed)
BP not under good control. Has been off his meds. Will restart and recheck in 1 month.  °

## 2022-01-30 NOTE — Assessment & Plan Note (Signed)
Stable. Refuses Phq9. Continue to monitor.

## 2022-01-30 NOTE — Assessment & Plan Note (Signed)
Under good control on current regimen. Continue current regimen. Continue to monitor. Call with any concerns. Refills given. Labs drawn today.   

## 2022-01-30 NOTE — Assessment & Plan Note (Signed)
Stable with a1c of 7.2. Continue current regimen. Continue to monitor. Call with any concerns.

## 2022-01-30 NOTE — Assessment & Plan Note (Signed)
BP not under good control. Has been off his meds. Will restart and recheck in 1 month.

## 2022-01-30 NOTE — Assessment & Plan Note (Signed)
Not under good control. Has been off his meds. Will restart and recheck in 1 month.

## 2022-01-31 LAB — PSA: Prostate Specific Ag, Serum: 1 ng/mL (ref 0.0–4.0)

## 2022-01-31 LAB — LIPID PANEL W/O CHOL/HDL RATIO
Cholesterol, Total: 230 mg/dL — ABNORMAL HIGH (ref 100–199)
HDL: 41 mg/dL (ref >39)
LDL Chol Calc (NIH): 124 mg/dL — ABNORMAL HIGH (ref 0–99)
Triglycerides: 366 mg/dL — ABNORMAL HIGH (ref 0–149)
VLDL Cholesterol Cal: 65 mg/dL — ABNORMAL HIGH (ref 5–40)

## 2022-01-31 LAB — CBC WITH DIFFERENTIAL/PLATELET
Basophils Absolute: 0.1 10*3/uL (ref 0.0–0.2)
Basos: 1 %
EOS (ABSOLUTE): 0.4 10*3/uL (ref 0.0–0.4)
Eos: 5 %
Hematocrit: 39 % (ref 37.5–51.0)
Hemoglobin: 12.6 g/dL — ABNORMAL LOW (ref 13.0–17.7)
Immature Grans (Abs): 0 10*3/uL (ref 0.0–0.1)
Immature Granulocytes: 0 %
Lymphocytes Absolute: 1.9 10*3/uL (ref 0.7–3.1)
Lymphs: 25 %
MCH: 27.7 pg (ref 26.6–33.0)
MCHC: 32.3 g/dL (ref 31.5–35.7)
MCV: 86 fL (ref 79–97)
Monocytes Absolute: 0.7 10*3/uL (ref 0.1–0.9)
Monocytes: 9 %
Neutrophils Absolute: 4.7 10*3/uL (ref 1.4–7.0)
Neutrophils: 60 %
Platelets: 198 10*3/uL (ref 150–450)
RBC: 4.55 x10E6/uL (ref 4.14–5.80)
RDW: 14.3 % (ref 11.6–15.4)
WBC: 7.8 10*3/uL (ref 3.4–10.8)

## 2022-01-31 LAB — COMPREHENSIVE METABOLIC PANEL
ALT: 26 IU/L (ref 0–44)
AST: 23 IU/L (ref 0–40)
Albumin/Globulin Ratio: 1.2 (ref 1.2–2.2)
Albumin: 3.7 g/dL — ABNORMAL LOW (ref 3.8–4.8)
Alkaline Phosphatase: 118 IU/L (ref 44–121)
BUN/Creatinine Ratio: 19 (ref 10–24)
BUN: 24 mg/dL (ref 8–27)
Bilirubin Total: <0.2 mg/dL (ref 0.0–1.2)
CO2: 21 mmol/L (ref 20–29)
Calcium: 8.7 mg/dL (ref 8.6–10.2)
Chloride: 105 mmol/L (ref 96–106)
Creatinine, Ser: 1.25 mg/dL (ref 0.76–1.27)
Globulin, Total: 3 g/dL (ref 1.5–4.5)
Glucose: 169 mg/dL — ABNORMAL HIGH (ref 70–99)
Potassium: 4.5 mmol/L (ref 3.5–5.2)
Sodium: 138 mmol/L (ref 134–144)
Total Protein: 6.7 g/dL (ref 6.0–8.5)
eGFR: 65 mL/min/{1.73_m2} (ref >59)

## 2022-01-31 LAB — TSH: TSH: 0.792 u[IU]/mL (ref 0.450–4.500)

## 2022-02-02 ENCOUNTER — Encounter: Payer: Self-pay | Admitting: Family Medicine

## 2022-02-18 ENCOUNTER — Other Ambulatory Visit: Payer: Self-pay | Admitting: Family Medicine

## 2022-02-18 DIAGNOSIS — E782 Mixed hyperlipidemia: Secondary | ICD-10-CM

## 2022-02-18 NOTE — Telephone Encounter (Signed)
Both medications were refilled 01/30/2022 #90 with 1 refill. Pharmacy confirmed receipt (in chart). ?Requested Prescriptions  ?Pending Prescriptions Disp Refills  ?? lovastatin (MEVACOR) 20 MG tablet [Pharmacy Med Name: LOVASTATIN 20 MG TABLET] 60 tablet   ?  Sig: TAKE TWO TABLETS BY MOUTH EVERY NIGHT AT BEDTIME  ?  ? Cardiovascular:  Antilipid - Statins 2 Failed - 02/18/2022  9:07 AM  ?  ?  Failed - Lipid Panel in normal range within the last 12 months  ?  Cholesterol, Total  ?Date Value Ref Range Status  ?01/30/2022 230 (H) 100 - 199 mg/dL Final  ? ?LDL Chol Calc (NIH)  ?Date Value Ref Range Status  ?01/30/2022 124 (H) 0 - 99 mg/dL Final  ? ?HDL  ?Date Value Ref Range Status  ?01/30/2022 41 >39 mg/dL Final  ? ?Triglycerides  ?Date Value Ref Range Status  ?01/30/2022 366 (H) 0 - 149 mg/dL Final  ? ?  ?  ?  Passed - Cr in normal range and within 360 days  ?  Creatinine  ?Date Value Ref Range Status  ?06/02/2012 1.10 0.60 - 1.30 mg/dL Final  ? ?Creatinine, Ser  ?Date Value Ref Range Status  ?01/30/2022 1.25 0.76 - 1.27 mg/dL Final  ?   ?  ?  Passed - Patient is not pregnant  ?  ?  Passed - Valid encounter within last 12 months  ?  Recent Outpatient Visits   ?      ? 2 weeks ago Routine general medical examination at a health care facility  ? [place], [place] P, DO  ? 7 months ago Controlled type 2 diabetes mellitus with hyperglycemia, without long-term current use of insulin ([place])  ? [place], DO  ? 8 months ago Upper respiratory tract infection, unspecified type  ? [place], NP  ? 10 months ago Controlled type 2 diabetes mellitus with hyperglycemia, without long-term current use of insulin ([place])  ? [place], [place] P, DO  ? 1 year ago Controlled type 2 diabetes mellitus with hyperglycemia, without long-term current use of insulin ([place])  ? [place], [place] P, DO  ?  ?  ?Future  Appointments   ?        ? In 1 week Wynetta Emery, Megan P, DO [place], [facility]  ?  ? ?  ?  ?  ?? metFORMIN (GLUCOPHAGE) 500 MG tablet [Pharmacy Med Name: metFORMIN HCL 500 MG TABLET] 120 tablet   ?  Sig: TAKE 2 TABLETS BY MOUTH TWO TIMES A DAY WITH FOOD  ?  ? Endocrinology:  Diabetes - Biguanides Failed - 02/18/2022  9:07 AM  ?  ?  Failed - B12 Level in normal range and within 720 days  ?  No results found for: VITAMINB12   ?  ?  Passed - Cr in normal range and within 360 days  ?  Creatinine  ?Date Value Ref Range Status  ?06/02/2012 1.10 0.60 - 1.30 mg/dL Final  ? ?Creatinine, Ser  ?Date Value Ref Range Status  ?01/30/2022 1.25 0.76 - 1.27 mg/dL Final  ?   ?  ?  Passed - HBA1C is between 0 and 7.9 and within 180 days  ?  HB A1C (BAYER DCA - WAIVED)  ?Date Value Ref Range Status  ?01/30/2022 7.2 (H) 4.8 - 5.6 % Final  ?  Comment:  ?  Prediabetes: 5.7 - 6.4 ?         Diabetes: >6.4 ?         Glycemic control for adults with diabetes: <7.0 ?  ?   ?  ?  Passed - eGFR in normal range and within 360 days  ?  EGFR (African American)  ?Date Value Ref Range Status  ?06/02/2012 >60  Final  ? ?GFR calc Af Wyvonnia Lora  ?Date Value Ref Range Status  ?10/08/2020 81 >59 mL/min/1.73 Final  ?  Comment:  ?  **In accordance with recommendations from the NKF-ASN Task force,** ?  [facility] is in the process of updating its eGFR calculation to the ?  2021 CKD-EPI creatinine equation that estimates kidney function ?  without a race variable. ?  ? ?EGFR (Non-African Amer.)  ?Date Value Ref Range Status  ?06/02/2012 >60  Final  ?  Comment:  ?  eGFR values <36m/min/1.73 m2 may be an indication of chronic ?kidney disease (CKD). ?Calculated eGFR is useful in patients with stable renal function. ?The eGFR calculation will not be reliable in acutely ill patients ?when serum creatinine is changing rapidly. It is not useful in  ?patients on dialysis. The eGFR calculation may not be applicable ?to patients at the low and high extremes of  body sizes, pregnant ?women, and vegetarians. ?  ? ?GFR calc non Af Amer  ?Date Value Ref Range Status  ?10/08/2020 70 >59 mL/min/1.73 Final  ? ?eGFR  ?Date Value Ref Range Status  ?01/30/2022 65 >59 mL/min/1.73 Final  ?   ?  ?  Passed - Valid encounter within last 6 months  ?  Recent Outpatient Visits   ?      ? 2 weeks ago Routine general medical examination at a health care facility  ? [place]  ? 7 months ago Controlled type 2 diabetes mellitus with hyperglycemia, without long-term current use of insulin ([place]  ? [place] DO  ? 8 months ago Upper respiratory tract infection, unspecified type  ? [place] NP  ? 10 months ago Controlled type 2 diabetes mellitus with hyperglycemia, without long-term current use of insulin ([place]  ? [place], DO  ? 1 year ago Controlled type 2 diabetes mellitus with hyperglycemia, without long-term current use of insulin ([place]  ? [place] DO  ?  ?  ?Future Appointments   ?        ? In 1 week JWynetta Emery Megan P, DO [facility], [facility]  ?  ? ?  ?  ?  Passed - CBC within normal limits and completed in the last 12 months  ?  WBC  ?Date Value Ref Range Status  ?01/30/2022 7.8 3.4 - 10.8 x10E3/uL Final  ?05/21/2016 8.3 4.0 - 10.5 K/uL Final  ? ?RBC  ?Date Value Ref Range Status  ?01/30/2022 4.55 4.14 - 5.80 x10E6/uL Final  ?05/21/2016 3.19 (L) 4.22 - 5.81 MIL/uL Final  ? ?Hemoglobin  ?Date Value Ref Range Status  ?01/30/2022 12.6 (L) 13.0 - 17.7 g/dL Final  ? ?Hematocrit  ?Date Value Ref Range Status  ?01/30/2022 39.0 37.5 - 51.0 % Final  ? ?MCHC  ?Date Value Ref Range Status  ?01/30/2022 32.3 31.5 - 35.7 g/dL Final  ?05/21/2016 32.5 30.0 - 36.0 g/dL Final  ? ?MCH  ?Date Value Ref Range Status  ?01/30/2022 27.7 26.6 - 33.0 pg Final  ?05/21/2016  27.6 26.0 - 34.0 pg Final  ? ?MCV  ?Date Value Ref Range Status   ?01/30/2022 86 79 - 97 fL Final  ? ?No results found for: PLTCOUNTKUC, LABPLAT, [place] ?RDW  ?Date Value Ref Range Status  ?01/30/2022 14.3 11.6 - 15.4 % Final  ? ?  ?  ?  ? ?

## 2022-02-24 ENCOUNTER — Telehealth: Payer: Self-pay | Admitting: Family Medicine

## 2022-02-24 NOTE — Telephone Encounter (Signed)
Copied from [place] [id number]. Topic: Medicare AWV ?>> Feb 24, 2022  2:29 PM Lavonia Drafts wrote: ?Reason for CRM:  ?N/A, VM not set up so I was unable to leave a message for patient to call back and schedule the Medicare Annual Wellness Visit (AWV) virtually or by telephone. ? ?Last AWV 08/30/20 ? ?Please schedule at anytime with [facility] Health Advisor. ? ?45 minute appointment ? ?Any questions, please call me at [phone] ?

## 2022-02-27 ENCOUNTER — Encounter: Payer: Self-pay | Admitting: Family Medicine

## 2022-02-27 ENCOUNTER — Other Ambulatory Visit: Payer: Self-pay

## 2022-02-27 ENCOUNTER — Ambulatory Visit (INDEPENDENT_AMBULATORY_CARE_PROVIDER_SITE_OTHER): Payer: Medicare HMO | Admitting: Family Medicine

## 2022-02-27 VITALS — BP 122/70 | HR 76 | Temp 98.7°F | Wt 186.0 lb

## 2022-02-27 DIAGNOSIS — I152 Hypertension secondary to endocrine disorders: Secondary | ICD-10-CM

## 2022-02-27 DIAGNOSIS — E1159 Type 2 diabetes mellitus with other circulatory complications: Secondary | ICD-10-CM

## 2022-02-27 NOTE — Progress Notes (Signed)
? ?BP 122/70   Pulse 76   Temp 98.7 ?F (37.1 ?C)   Wt 186 lb (84.4 kg)   SpO2 97%   BMI 25.94 kg/m?   ? ?Subjective:  ? ? Patient ID: Lucas Weiss, male    DOB: 12-10-1956, 66 y.o.   MRN: [id number] ? ?HPI: ?Lucas Weiss is a 66 y.o. male ? ?Chief Complaint  ?Patient presents with  ? Hypertension  ? ?HYPERTENSION ?Hypertension status: better  ?Satisfied with current treatment? yes ?Duration of hypertension: chronic ?BP monitoring frequency:  not checking ?BP medication side effects:  no ?Medication compliance: excellent compliance ?Previous BP meds: amlodipine, lisinopril ?Aspirin: yes ?Recurrent headaches: no ?Visual changes: no ?Palpitations: no ?Dyspnea: no ?Chest pain: no ?Lower extremity edema: no ?Dizzy/lightheaded: no ? ? ?Relevant past medical, surgical, family and social history reviewed and updated as indicated. Interim medical history since our last visit reviewed. ?Allergies and medications reviewed and updated. ? ?Review of Systems  ?Constitutional: Negative.   ?Respiratory: Negative.    ?Cardiovascular: Negative.   ?Musculoskeletal: Negative.   ?Neurological: Negative.   ?Psychiatric/Behavioral: Negative.    ? ?Per HPI unless specifically indicated above ? ?   ?Objective:  ?  ?BP 122/70   Pulse 76   Temp 98.7 ?F (37.1 ?C)   Wt 186 lb (84.4 kg)   SpO2 97%   BMI 25.94 kg/m?   ?Wt Readings from Last 3 Encounters:  ?02/27/22 186 lb (84.4 kg)  ?01/30/22 182 lb (82.6 kg)  ?07/11/21 178 lb (80.7 kg)  ?  ?Physical Exam ?Vitals and nursing note reviewed.  ?Constitutional:   ?   General: He is not in acute distress. ?   Appearance: Normal appearance. He is not ill-appearing, toxic-appearing or diaphoretic.  ?HENT:  ?   Head: Normocephalic and atraumatic.  ?   Right Ear: External ear normal.  ?   Left Ear: External ear normal.  ?   Nose: Nose normal.  ?   Mouth/Throat:  ?   Mouth: Mucous membranes are moist.  ?   Pharynx: Oropharynx is clear.  ?Eyes:  ?   General: No scleral icterus.    ?   Right eye:  No discharge.     ?   Left eye: No discharge.  ?   Extraocular Movements: Extraocular movements intact.  ?   Conjunctiva/sclera: Conjunctivae normal.  ?   Pupils: Pupils are equal, round, and reactive to light.  ?Cardiovascular:  ?   Rate and Rhythm: Normal rate and regular rhythm.  ?   Pulses: Normal pulses.  ?   Heart sounds: Normal heart sounds. No murmur heard. ?  No friction rub. No gallop.  ?Pulmonary:  ?   Effort: Pulmonary effort is normal. No respiratory distress.  ?   Breath sounds: Normal breath sounds. No stridor. No wheezing, rhonchi or rales.  ?Chest:  ?   Chest wall: No tenderness.  ?Musculoskeletal:     ?   General: Normal range of motion.  ?   Cervical back: Normal range of motion and neck supple.  ?Skin: ?   General: Skin is warm and dry.  ?   Capillary Refill: Capillary refill takes less than 2 seconds.  ?   Coloration: Skin is not jaundiced or pale.  ?   Findings: No bruising, erythema, lesion or rash.  ?Neurological:  ?   General: No focal deficit present.  ?   Mental Status: He is alert and oriented to person, place, and time. Mental status is  at baseline.  ?Psychiatric:     ?   Mood and Affect: Mood normal.     ?   Behavior: Behavior normal.     ?   Thought Content: Thought content normal.     ?   Judgment: Judgment normal.  ? ? ?Results for orders placed or performed in visit on 01/30/22  ?Microscopic Examination  ? Urine  ?Result Value Ref Range  ? WBC, UA None seen 0 - 5 /hpf  ? RBC None seen 0 - 2 /hpf  ? Epithelial Cells (non renal) None seen 0 - 10 /hpf  ? Mucus, UA Present (A) Not Estab.  ? Bacteria, UA None seen None seen/Few  ?Comprehensive metabolic panel  ?Result Value Ref Range  ? Glucose 169 (H) 70 - 99 mg/dL  ? BUN 24 8 - 27 mg/dL  ? Creatinine, Ser 1.25 0.76 - 1.27 mg/dL  ? eGFR 65 >59 mL/min/1.73  ? BUN/Creatinine Ratio 19 10 - 24  ? Sodium 138 134 - 144 mmol/L  ? Potassium 4.5 3.5 - 5.2 mmol/L  ? Chloride 105 96 - 106 mmol/L  ? CO2 21 20 - 29 mmol/L  ? Calcium 8.7 8.6 - 10.2  mg/dL  ? Total Protein 6.7 6.0 - 8.5 g/dL  ? Albumin 3.7 (L) 3.8 - 4.8 g/dL  ? Globulin, Total 3.0 1.5 - 4.5 g/dL  ? Albumin/Globulin Ratio 1.2 1.2 - 2.2  ? Bilirubin Total <0.2 0.0 - 1.2 mg/dL  ? Alkaline Phosphatase 118 44 - 121 IU/L  ? AST 23 0 - 40 IU/L  ? ALT 26 0 - 44 IU/L  ?CBC with Differential/Platelet  ?Result Value Ref Range  ? WBC 7.8 3.4 - 10.8 x10E3/uL  ? RBC 4.55 4.14 - 5.80 x10E6/uL  ? Hemoglobin 12.6 (L) 13.0 - 17.7 g/dL  ? Hematocrit 39.0 37.5 - 51.0 %  ? MCV 86 79 - 97 fL  ? MCH 27.7 26.6 - 33.0 pg  ? MCHC 32.3 31.5 - 35.7 g/dL  ? RDW 14.3 11.6 - 15.4 %  ? Platelets 198 150 - 450 x10E3/uL  ? Neutrophils 60 Not Estab. %  ? Lymphs 25 Not Estab. %  ? Monocytes 9 Not Estab. %  ? Eos 5 Not Estab. %  ? Basos 1 Not Estab. %  ? Neutrophils Absolute 4.7 1.4 - 7.0 x10E3/uL  ? Lymphocytes Absolute 1.9 0.7 - 3.1 x10E3/uL  ? Monocytes Absolute 0.7 0.1 - 0.9 x10E3/uL  ? EOS (ABSOLUTE) 0.4 0.0 - 0.4 x10E3/uL  ? Basophils Absolute 0.1 0.0 - 0.2 x10E3/uL  ? Immature Granulocytes 0 Not Estab. %  ? Immature Grans (Abs) 0.0 0.0 - 0.1 x10E3/uL  ?Lipid Panel w/o Chol/HDL Ratio  ?Result Value Ref Range  ? Cholesterol, Total 230 (H) 100 - 199 mg/dL  ? Triglycerides 366 (H) 0 - 149 mg/dL  ? HDL 41 >39 mg/dL  ? VLDL Cholesterol Cal 65 (H) 5 - 40 mg/dL  ? LDL Chol Calc (NIH) 124 (H) 0 - 99 mg/dL  ?PSA  ?Result Value Ref Range  ? Prostate Specific Ag, Serum 1.0 0.0 - 4.0 ng/mL  ?TSH  ?Result Value Ref Range  ? TSH 0.792 0.450 - 4.500 uIU/mL  ?Urinalysis, Routine w reflex microscopic  ?Result Value Ref Range  ? Specific Gravity, UA >1.030 (H) 1.005 - 1.030  ? pH, UA 6.0 5.0 - 7.5  ? Color, UA Yellow Yellow  ? Appearance Ur Clear Clear  ? Leukocytes,UA Negative Negative  ? Protein,UA 3+ (  A) Negative/Trace  ? Glucose, UA Negative Negative  ? Ketones, UA Negative Negative  ? RBC, UA Negative Negative  ? Bilirubin, UA Negative Negative  ? Urobilinogen, Ur 0.2 0.2 - 1.0 mg/dL  ? Nitrite, UA Negative Negative  ? Microscopic  Examination See below:   ?Microalbumin, Urine Waived  ?Result Value Ref Range  ? Microalb, Ur Waived 150 (H) 0 - 19 mg/L  ? Creatinine, Urine Waived 200 10 - 300 mg/dL  ? Microalb/Creat Ratio 30-300 (H) <30 mg/g  ?Bayer DCA Hb A1c Waived  ?Result Value Ref Range  ? HB A1C (BAYER DCA - WAIVED) 7.2 (H) 4.8 - 5.6 %  ? ?   ?Assessment & Plan:  ? ?Problem List Items Addressed This Visit   ? ?  ? Cardiovascular and Mediastinum  ? Hypertension associated with diabetes ([place]) - Primary  ?  Under good control on current regimen. Continue current regimen. Continue to monitor. Call with any concerns. Refills up to date. Labs drawn today.  ? ?  ?  ? Relevant Orders  ? Basic metabolic panel  ?  ? ?Follow up plan: ?Return in about 2 months (around 04/29/2022). ? ? ? ? ? ?

## 2022-02-27 NOTE — Assessment & Plan Note (Signed)
Under good control on current regimen. Continue current regimen. Continue to monitor. Call with any concerns. Refills up to date. Labs drawn today.  

## 2022-02-28 LAB — BASIC METABOLIC PANEL
BUN/Creatinine Ratio: 17 (ref 10–24)
BUN: 29 mg/dL — ABNORMAL HIGH (ref 8–27)
CO2: 16 mmol/L — ABNORMAL LOW (ref 20–29)
Calcium: 9.3 mg/dL (ref 8.6–10.2)
Chloride: 105 mmol/L (ref 96–106)
Creatinine, Ser: 1.69 mg/dL — ABNORMAL HIGH (ref 0.76–1.27)
Glucose: 174 mg/dL — ABNORMAL HIGH (ref 70–99)
Potassium: 4.5 mmol/L (ref 3.5–5.2)
Sodium: 138 mmol/L (ref 134–144)
eGFR: 45 mL/min/{1.73_m2} — ABNORMAL LOW (ref >59)

## 2022-03-02 ENCOUNTER — Other Ambulatory Visit: Payer: Self-pay | Admitting: Family Medicine

## 2022-03-02 DIAGNOSIS — N289 Disorder of kidney and ureter, unspecified: Secondary | ICD-10-CM

## 2022-03-31 ENCOUNTER — Telehealth: Payer: Self-pay | Admitting: Nurse Practitioner

## 2022-03-31 NOTE — Telephone Encounter (Signed)
Noted  

## 2022-03-31 NOTE — Telephone Encounter (Signed)
Patient previously declined to schedule echo and fu.  Will call if he changes mind.  Removing order.  ?

## 2022-04-09 ENCOUNTER — Other Ambulatory Visit: Payer: Self-pay | Admitting: Family Medicine

## 2022-04-09 DIAGNOSIS — E782 Mixed hyperlipidemia: Secondary | ICD-10-CM

## 2022-04-09 NOTE — Telephone Encounter (Signed)
Requested Prescriptions  ?Pending Prescriptions Disp Refills  ?? metFORMIN (GLUCOPHAGE) 500 MG tablet [Pharmacy Med Name: metFORMIN HCL 500 MG TABLET] 120 tablet 0  ?  Sig: TAKE 2 TABLETS BY MOUTH TWO TIMES A DAY WITH FOOD  ?  ? Endocrinology:  Diabetes - Biguanides Failed - 04/09/2022  7:33 AM  ?  ?  Failed - Cr in normal range and within 360 days  ?  Creatinine  ?Date Value Ref Range Status  ?06/02/2012 1.10 0.60 - 1.30 mg/dL Final  ? ?Creatinine, Ser  ?Date Value Ref Range Status  ?02/27/2022 1.69 (H) 0.76 - 1.27 mg/dL Final  ?   ?  ?  Failed - eGFR in normal range and within 360 days  ?  EGFR (African American)  ?Date Value Ref Range Status  ?06/02/2012 >60  Final  ? ?GFR calc Af Wyvonnia Lora  ?Date Value Ref Range Status  ?10/08/2020 81 >59 mL/min/1.73 Final  ?  Comment:  ?  **In accordance with recommendations from the NKF-ASN Task force,** ?  [facility] is in the process of updating its eGFR calculation to the ?  2021 CKD-EPI creatinine equation that estimates kidney function ?  without a race variable. ?  ? ?EGFR (Non-African Amer.)  ?Date Value Ref Range Status  ?06/02/2012 >60  Final  ?  Comment:  ?  eGFR values <54m/min/1.73 m2 may be an indication of chronic ?kidney disease (CKD). ?Calculated eGFR is useful in patients with stable renal function. ?The eGFR calculation will not be reliable in acutely ill patients ?when serum creatinine is changing rapidly. It is not useful in  ?patients on dialysis. The eGFR calculation may not be applicable ?to patients at the low and high extremes of body sizes, pregnant ?women, and vegetarians. ?  ? ?GFR calc non Af Amer  ?Date Value Ref Range Status  ?10/08/2020 70 >59 mL/min/1.73 Final  ? ?eGFR  ?Date Value Ref Range Status  ?02/27/2022 45 (L) >59 mL/min/1.73 Final  ?   ?  ?  Failed - B12 Level in normal range and within 720 days  ?  No results found for: VITAMINB12   ?  ?  Passed - HBA1C is between 0 and 7.9 and within 180 days  ?  HB A1C (BAYER DCA - WAIVED)  ?Date Value Ref  Range Status  ?01/30/2022 7.2 (H) 4.8 - 5.6 % Final  ?  Comment:  ?           Prediabetes: 5.7 - 6.4 ?         Diabetes: >6.4 ?         Glycemic control for adults with diabetes: <7.0 ?  ?   ?  ?  Passed - Valid encounter within last 6 months  ?  Recent Outpatient Visits   ?      ? 1 month ago Hypertension associated with diabetes ([place]  ? [place] DO  ? 2 months ago Routine general medical examination at a health care facility  ? [place] P, DO  ? 9 months ago Controlled type 2 diabetes mellitus with hyperglycemia, without long-term current use of insulin ([place]  ? [place]  ? 10 months ago Upper respiratory tract infection, unspecified type  ? [place] NP  ? 1 year ago Controlled type 2 diabetes mellitus with hyperglycemia, without long-term current use of insulin ([place]  ? [place]  DO  ?  ?  ?Future Appointments   ?        ? In 3 weeks Wynetta Emery, Megan P, DO [place], [facility]  ?  ? ?  ?  ?  Passed - CBC within normal limits and completed in the last 12 months  ?  WBC  ?Date Value Ref Range Status  ?01/30/2022 7.8 3.4 - 10.8 x10E3/uL Final  ?05/21/2016 8.3 4.0 - 10.5 K/uL Final  ? ?RBC  ?Date Value Ref Range Status  ?01/30/2022 4.55 4.14 - 5.80 x10E6/uL Final  ?05/21/2016 3.19 (L) 4.22 - 5.81 MIL/uL Final  ? ?Hemoglobin  ?Date Value Ref Range Status  ?01/30/2022 12.6 (L) 13.0 - 17.7 g/dL Final  ? ?Hematocrit  ?Date Value Ref Range Status  ?01/30/2022 39.0 37.5 - 51.0 % Final  ? ?MCHC  ?Date Value Ref Range Status  ?01/30/2022 32.3 31.5 - 35.7 g/dL Final  ?05/21/2016 32.5 30.0 - 36.0 g/dL Final  ? ?MCH  ?Date Value Ref Range Status  ?01/30/2022 27.7 26.6 - 33.0 pg Final  ?05/21/2016 27.6 26.0 - 34.0 pg Final  ? ?MCV  ?Date Value Ref Range Status  ?01/30/2022 86 79 - 97 fL Final  ? ?No results found for: PLTCOUNTKUC, LABPLAT, [place] ?RDW   ?Date Value Ref Range Status  ?01/30/2022 14.3 11.6 - 15.4 % Final  ? ?  ?  ?  ? ?

## 2022-04-09 NOTE — Telephone Encounter (Signed)
Medication Refill - Medication:  ? ?lovastatin (MEVACOR) 20 MG tablet ? ?He says has taken 2 before bedtime for 10 years  ? ?Has the patient contacted their pharmacy? Yes.   ?(Agent: If no, request that the patient contact the pharmacy for the refill. If patient does not wish to contact the pharmacy document the reason why and proceed with request.) ?(Agent: If yes, when and what did the pharmacy advise?) ? ?Preferred Pharmacy (with phone number or street name):  ?[facility] [id number], [place]  ?[place] [address]  ?Phone: [phone] Fax: [phone]  ? ?Has the patient been seen for an appointment in the last year OR does the patient have an upcoming appointment? Yes.   ? ?Agent: Please be advised that RX refills may take up to 3 business days. We ask that you follow-up with your pharmacy. ? ?

## 2022-04-10 NOTE — Telephone Encounter (Signed)
last RF 01/30/22 #90 1 RF-  ?Requested Prescriptions  ?Refused Prescriptions Disp Refills  ?? lovastatin (MEVACOR) 20 MG tablet 90 tablet 1  ?  Sig: Take 1 tablet (20 mg total) by mouth at bedtime.  ?  ? Cardiovascular:  Antilipid - Statins 2 Failed - 04/09/2022  3:42 PM  ?  ?  Failed - Cr in normal range and within 360 days  ?  Creatinine  ?Date Value Ref Range Status  ?06/02/2012 1.10 0.60 - 1.30 mg/dL Final  ? ?Creatinine, Ser  ?Date Value Ref Range Status  ?02/27/2022 1.69 (H) 0.76 - 1.27 mg/dL Final  ?   ?  ?  Failed - Lipid Panel in normal range within the last 12 months  ?  Cholesterol, Total  ?Date Value Ref Range Status  ?01/30/2022 230 (H) 100 - 199 mg/dL Final  ? ?LDL Chol Calc (NIH)  ?Date Value Ref Range Status  ?01/30/2022 124 (H) 0 - 99 mg/dL Final  ? ?HDL  ?Date Value Ref Range Status  ?01/30/2022 41 >39 mg/dL Final  ? ?Triglycerides  ?Date Value Ref Range Status  ?01/30/2022 366 (H) 0 - 149 mg/dL Final  ? ?  ?  ?  Passed - Patient is not pregnant  ?  ?  Passed - Valid encounter within last 12 months  ?  Recent Outpatient Visits   ?      ? 1 month ago Hypertension associated with diabetes ([facility])  ? [facility] [place], Megan P, DO  ? 2 months ago Routine general medical examination at a health care facility  ? [facility] [place], Megan P, DO  ? 9 months ago Controlled type 2 diabetes mellitus with hyperglycemia, without long-term current use of insulin (HCC)  ? [facility] [place], Megan P, DO  ? 10 months ago Upper respiratory tract infection, unspecified type  ? [facility] Larae Grooms, NP  ? 1 year ago Controlled type 2 diabetes mellitus with hyperglycemia, without long-term current use of insulin (HCC)  ? [facility] [place], [place] P, DO  ?  ?  ?Future Appointments   ?        ? In 3 weeks Laural Benes, Oralia Rud, DO [facility], [facility]  ?  ? ?  ?  ?  ? ? ?

## 2022-04-13 ENCOUNTER — Telehealth: Payer: Self-pay | Admitting: Family Medicine

## 2022-04-13 DIAGNOSIS — E782 Mixed hyperlipidemia: Secondary | ICD-10-CM

## 2022-04-13 MED ORDER — LOVASTATIN 40 MG PO TABS: 40.0000 mg | ORAL_TABLET | Freq: Every day | ORAL | 1 refills | Status: DC

## 2022-04-13 NOTE — Telephone Encounter (Signed)
Routing to provider to clarify. RX is written to take one tablet daily, patient has been taking 2. I do not see documentation instructing patient to take 2 tablets daily. Please advise.  ?

## 2022-04-13 NOTE — Telephone Encounter (Signed)
Copied from CRM [id number]. Topic: General - Other ?>> Apr 13, 2022 12:16 PM Gaetana Michaelis A wrote: ?Reason for CRM: The patient has called for an update on their previously requested prescription for lovastatin (MEVACOR) 20 MG tablet [[id number]]  ? ?Please contact the patient further when possible ?

## 2022-04-13 NOTE — Telephone Encounter (Signed)
Patient notified about medication.  

## 2022-04-13 NOTE — Telephone Encounter (Signed)
[facility] called and spoke to Lucas Weiss, [facility] about the refill(s) Lovastatin requested. Advised it was sent on 01/30/22 #90/1 refill(s). She says they have it, but he's saying he takes 2 per day and not 1. She says the Rx needs clarifying. Advised will send this to the provider. ?

## 2022-04-13 NOTE — Telephone Encounter (Signed)
40mg  sent through for him. Now should only be taking 1 pill a day NOT 2 ?

## 2022-04-21 ENCOUNTER — Telehealth: Payer: Self-pay

## 2022-04-21 NOTE — Chronic Care Management (AMB) (Signed)
? ? ?Chronic Care Management ?Pharmacy Assistant  ? ?Name: Lucas Weiss  MRN: [id number] DOB: [birth year] ? ?Reason for Encounter: Disease State General  ? ? ?Recent office visits:  ?02/27/22-Lucas Annia Friendly, DO (PCP) Seen for hypertension. Labs ordered. Follow up in 2 months. ?01/30/22-Lucas Annia Friendly, DO (PCP) Seen for annual exam visit. Restart amLODipine (NORVASC) 5 MG tablet, lisinopril (ZESTRIL) 40 MG tablet, metoprolol tartrate (LOPRESSOR) 50 MG tablet. Labs ordered. Cologuard ordered. Follow up in 4 weeks. ? ?Recent consult visits:  ?None noted ? ?Hospital visits:  ?None in previous 6 months ? ?Medications: ?Outpatient Encounter Medications as of 04/21/2022  ?Medication Sig  ? amLODipine (NORVASC) 5 MG tablet Take 1 tablet (5 mg total) by mouth daily.  ? aspirin EC 81 MG tablet Take 81 mg by mouth daily.  ? Blood Glucose Monitoring Suppl (ONETOUCH VERIO) w/Device KIT To check blood sugar twice a day and document, bring to provider visits.  ? dapagliflozin propanediol (FARXIGA) 10 MG TABS tablet Take 1 tablet (10 mg total) by mouth daily before breakfast.  ? gabapentin (NEURONTIN) 300 MG capsule Take 300 mg by mouth 3 (three) times daily.  ? glucose blood (ONETOUCH VERIO) test strip To check blood sugar twice a day and document, bring to provider visits.  ? Lancets (ONETOUCH ULTRASOFT) lancets To check blood sugar twice a day and document, bring to provider visits.  ? lisinopril (ZESTRIL) 40 MG tablet Take 1 tablet (40 mg total) by mouth daily.  ? lovastatin (MEVACOR) 40 MG tablet Take 1 tablet (40 mg total) by mouth at bedtime.  ? metFORMIN (GLUCOPHAGE) 500 MG tablet TAKE 2 TABLETS BY MOUTH TWO TIMES A DAY WITH FOOD  ? metoprolol tartrate (LOPRESSOR) 50 MG tablet Take 1 tablet (50 mg total) by mouth daily.  ? nitroGLYCERIN (NITROSTAT) 0.4 MG SL tablet Place 1 tablet (0.4 mg total) under the tongue every 5 (five) minutes as needed for chest pain.  ? QUEtiapine (SEROQUEL) 25 MG tablet Take 1 tablet (25 mg  total) by mouth at bedtime.  ? ?No facility-administered encounter medications on file as of 04/21/2022.  ? ?[facility] [facility] for General Review Call ? ? ?Chart Review: ? ?Have there been any documented new, changed, or discontinued medications since last visit? Yes  01/30/22:Restart amLODipine (NORVASC) 5 MG tablet, lisinopril (ZESTRIL) 40 MG tablet, metoprolol tartrate (LOPRESSOR) 50 MG tablet. ? ?Has there been any documented recent hospitalizations or ED visits since last visit with Clinical Pharmacist? No ? ? ? ?Adherence Review: ? ?Does the Clinical Pharmacist Assistant have access to adherence rates? Yes ? ?Adherence rates for STAR metric medications (List medication(s)/day supply/ last 2 fill dates).See below of the note. ? ?Does the patient have >5 day gap between last estimated fill dates for any of the above medications or other medication gaps? Yes ? ?Reason for medication gaps. Patient hasn't taken [name] since last year. ? ? ?Disease State Questions: ? ?Able to connect with Patient? Yes ?Did patient have any problems with their health recently? No ?Note problems and Concerns:N/A ? ? ?Have you had any admissions or emergency room visits or worsening of your condition(s) since last visit? No ?Details of ED visit, hospital visit and/or worsening condition(s):N/a ? ?Have you had any visits with new specialists or providers since your last visit? No ?Explain:N/A ? ?Have you had any new health care problem(s) since your last visit? No ?New problem(s) reported:N/A ? ?Have you run out of any of your medications since you last spoke  with clinical pharmacist? No ?What caused you to run out of your medications?N/A ? ?Are there any medications you are not taking as prescribed? No ?What kept you from taking your medications as prescribed? N/A ? ?Are you having any issues or side effects with your medications? No ?Note of issues or side effects:N/A ? ?Do you have any other health concerns or questions you want  to discuss with your Clinical Pharmacist before your next visit? No ?Note additional concerns and questions from Patient.N/A ? ?Are there any health concerns that you feel we can do a better job addressing? No ?Note Patient's response. N/A ? ?Are you having any problems with any of the following since the last visit: (select all that apply) ? None ? Details: ? ?12. Any falls since last visit? No ? Details:N/A ? ?13. Any increased or uncontrolled pain since last visit? No ? Details:N/A ? ?14. Next visit Type: office ?      Visit with:Lucas Wynetta Emery, DO ?       Date:05/01/22 ?       Time:3pm ? ?15. Additional Details? No  ? ? ?Care Gaps: ?None noted ? ?Star Rating Drugs: ?Lovastatin 40 mg Last filled:04/13/22 90 DS ?Farxiga 10 mg Last filled:09/09/21 30 DS ?Metformin 500 mg Last filled:04/09/22 30 DS ?Lisinopril 40 mg Last filled:03/06/22 90 DS ? ?Corrie Mckusick, RMA ?Health Concierge ? ?

## 2022-05-01 ENCOUNTER — Encounter: Payer: Self-pay | Admitting: Family Medicine

## 2022-05-01 ENCOUNTER — Ambulatory Visit (INDEPENDENT_AMBULATORY_CARE_PROVIDER_SITE_OTHER): Payer: Medicare HMO | Admitting: Family Medicine

## 2022-05-01 VITALS — BP 112/62 | HR 75 | Temp 98.0°F | Wt 182.6 lb

## 2022-05-01 DIAGNOSIS — E1165 Type 2 diabetes mellitus with hyperglycemia: Secondary | ICD-10-CM | POA: Diagnosis not present

## 2022-05-01 DIAGNOSIS — G894 Chronic pain syndrome: Secondary | ICD-10-CM

## 2022-05-01 DIAGNOSIS — I110 Hypertensive heart disease with heart failure: Secondary | ICD-10-CM | POA: Diagnosis not present

## 2022-05-01 DIAGNOSIS — I152 Hypertension secondary to endocrine disorders: Secondary | ICD-10-CM

## 2022-05-01 DIAGNOSIS — N289 Disorder of kidney and ureter, unspecified: Secondary | ICD-10-CM

## 2022-05-01 DIAGNOSIS — R69 Illness, unspecified: Secondary | ICD-10-CM | POA: Diagnosis not present

## 2022-05-01 DIAGNOSIS — E782 Mixed hyperlipidemia: Secondary | ICD-10-CM

## 2022-05-01 DIAGNOSIS — F332 Major depressive disorder, recurrent severe without psychotic features: Secondary | ICD-10-CM | POA: Diagnosis not present

## 2022-05-01 DIAGNOSIS — E1159 Type 2 diabetes mellitus with other circulatory complications: Secondary | ICD-10-CM

## 2022-05-01 LAB — BAYER DCA HB A1C WAIVED: HB A1C (BAYER DCA - WAIVED): 8.6 % — ABNORMAL HIGH (ref 4.8–5.6)

## 2022-05-01 MED ORDER — DULOXETINE HCL 20 MG PO CPEP: 20.0000 mg | ORAL_CAPSULE | Freq: Every day | ORAL | 3 refills | Status: DC

## 2022-05-01 MED ORDER — AMLODIPINE BESYLATE 5 MG PO TABS: 5.0000 mg | ORAL_TABLET | Freq: Every day | ORAL | 1 refills | Status: DC

## 2022-05-01 MED ORDER — LISINOPRIL 40 MG PO TABS
40.0000 mg | ORAL_TABLET | Freq: Every day | ORAL | 1 refills | Status: DC
Start: 2022-05-01 — End: 2022-11-09

## 2022-05-01 MED ORDER — METFORMIN HCL 500 MG PO TABS: ORAL_TABLET | ORAL | 1 refills | Status: DC

## 2022-05-01 MED ORDER — METOPROLOL TARTRATE 50 MG PO TABS: 50.0000 mg | ORAL_TABLET | Freq: Every day | ORAL | 1 refills | Status: DC

## 2022-05-01 MED ORDER — LOVASTATIN 40 MG PO TABS: 40.0000 mg | ORAL_TABLET | Freq: Every day | ORAL | 1 refills | Status: DC

## 2022-05-01 NOTE — Assessment & Plan Note (Signed)
Under good control on current regimen. Continue current regimen. Continue to monitor. Call with any concerns. Refills given. Labs drawn today.   

## 2022-05-01 NOTE — Progress Notes (Signed)
? ?BP 112/62   Pulse 75   Temp 98 ?F (36.7 ?C)   Wt 182 lb 9.6 oz (82.8 kg)   SpO2 96%   BMI 25.47 kg/m?   ? ?Subjective:  ? ? Patient ID: Lucas Weiss, male    DOB: 09-19-1956, 66 y.o.   MRN: [id number] ? ?HPI: ?Lucas Weiss is a 66 y.o. male ? ?Chief Complaint  ?Patient presents with  ? Hypertension  ? Diabetes  ? ?HYPERTENSION ?Hypertension status: better  ?Satisfied with current treatment? yes ?Duration of hypertension: chronic ?BP monitoring frequency:  not checking ?BP medication side effects:  no ?Medication compliance: excellent compliance ?Aspirin: no ?Recurrent headaches: no ?Visual changes: no ?Palpitations: no ?Dyspnea: no ?Chest pain: no ?Lower extremity edema: no ?Dizzy/lightheaded: no ? ?DIABETES ?Hypoglycemic episodes:no ?Polydipsia/polyuria: no ?Visual disturbance: no ?Chest pain: no ?Paresthesias: no ?Glucose Monitoring: no ? Accucheck frequency: Not Checking ?Taking Insulin?: no ?Blood Pressure Monitoring: not checking ?Retinal Examination: Not up to Date ?Foot Exam: Up to Date ?Diabetic Education: Completed ?Pneumovax: Up to Date ?Influenza: Up to Date ?Aspirin: yes ? ?DEPRESSION ?Mood status: uncontrolled ?Satisfied with current treatment?: no ?Symptom severity: moderate  ?Duration of current treatment :  not on anything ?Depressed mood: yes ?Anxious mood: no ?Anhedonia: no ?Significant weight loss or gain: no ?Insomnia: no  ?Fatigue: yes ?Feelings of worthlessness or guilt: no ?Impaired concentration/indecisiveness: no ?Suicidal ideations: no ?Hopelessness: no ?Crying spells: no ? ?  05/01/2022  ?  3:08 PM 01/30/2022  ?  3:20 PM 08/27/2021  ? 12:19 PM 01/09/2020  ?  1:24 PM 07/31/2019  ?  1:02 PM  ?Depression screen PHQ 2/9  ?Decreased Interest  0 0 0 0  ?Down, Depressed, Hopeless  0 0 0 0  ?PHQ - 2 Score  0 0 0 0  ?Difficult doing work/chores Not difficult at all      ? ? ? ?Relevant past medical, surgical, family and social history reviewed and updated as indicated. Interim medical history  since our last visit reviewed. ?Allergies and medications reviewed and updated. ? ?Review of Systems  ?Constitutional:  Positive for fatigue. Negative for activity change, appetite change, chills, diaphoresis, fever and unexpected weight change.  ?Respiratory: Negative.    ?Cardiovascular: Negative.   ?Gastrointestinal: Negative.   ?Musculoskeletal: Negative.   ?Psychiatric/Behavioral:  Positive for dysphoric mood. Negative for agitation, behavioral problems, confusion, decreased concentration, hallucinations, self-injury, sleep disturbance and suicidal ideas. The patient is not nervous/anxious and is not hyperactive.   ? ?Per HPI unless specifically indicated above ? ?   ?Objective:  ?  ?BP 112/62   Pulse 75   Temp 98 ?F (36.7 ?C)   Wt 182 lb 9.6 oz (82.8 kg)   SpO2 96%   BMI 25.47 kg/m?   ?Wt Readings from Last 3 Encounters:  ?05/01/22 182 lb 9.6 oz (82.8 kg)  ?02/27/22 186 lb (84.4 kg)  ?01/30/22 182 lb (82.6 kg)  ?  ?Physical Exam ?Vitals and nursing note reviewed.  ?Constitutional:   ?   General: He is not in acute distress. ?   Appearance: Normal appearance. He is normal weight. He is not ill-appearing, toxic-appearing or diaphoretic.  ?HENT:  ?   Head: Normocephalic and atraumatic.  ?   Right Ear: External ear normal.  ?   Left Ear: External ear normal.  ?   Nose: Nose normal.  ?   Mouth/Throat:  ?   Mouth: Mucous membranes are moist.  ?   Pharynx: Oropharynx is clear.  ?Eyes:  ?  General: No scleral icterus.    ?   Right eye: No discharge.     ?   Left eye: No discharge.  ?   Extraocular Movements: Extraocular movements intact.  ?   Conjunctiva/sclera: Conjunctivae normal.  ?   Pupils: Pupils are equal, round, and reactive to light.  ?Cardiovascular:  ?   Rate and Rhythm: Normal rate and regular rhythm.  ?   Pulses: Normal pulses.  ?   Heart sounds: Normal heart sounds. No murmur heard. ?  No friction rub. No gallop.  ?Pulmonary:  ?   Effort: Pulmonary effort is normal. No respiratory distress.  ?    Breath sounds: Normal breath sounds. No stridor. No wheezing, rhonchi or rales.  ?Chest:  ?   Chest wall: No tenderness.  ?Musculoskeletal:     ?   General: Normal range of motion.  ?   Cervical back: Normal range of motion and neck supple.  ?Skin: ?   General: Skin is warm and dry.  ?   Capillary Refill: Capillary refill takes less than 2 seconds.  ?   Coloration: Skin is not jaundiced or pale.  ?   Findings: No bruising, erythema, lesion or rash.  ?Neurological:  ?   General: No focal deficit present.  ?   Mental Status: He is alert and oriented to person, place, and time. Mental status is at baseline.  ?Psychiatric:     ?   Mood and Affect: Mood normal.     ?   Behavior: Behavior normal.     ?   Thought Content: Thought content normal.     ?   Judgment: Judgment normal.  ? ? ?Results for orders placed or performed in visit on 05/01/22  ?Bayer DCA Hb A1c Waived  ?Result Value Ref Range  ? HB A1C (BAYER DCA - WAIVED) 8.6 (H) 4.8 - 5.6 %  ? ?   ?Assessment & Plan:  ? ?Problem List Items Addressed This Visit   ? ?  ? Cardiovascular and Mediastinum  ? Hypertensive heart failure (HCC)  ? Relevant Medications  ? metoprolol tartrate (LOPRESSOR) 50 MG tablet  ? lovastatin (MEVACOR) 40 MG tablet  ? lisinopril (ZESTRIL) 40 MG tablet  ? amLODipine (NORVASC) 5 MG tablet  ? Hypertension associated with diabetes (HCC)  ?  Under good control on current regimen. Continue current regimen. Continue to monitor. Call with any concerns. Refills given. Labs drawn today.  ? ? ?  ?  ? Relevant Medications  ? metFORMIN (GLUCOPHAGE) 500 MG tablet  ? metoprolol tartrate (LOPRESSOR) 50 MG tablet  ? lovastatin (MEVACOR) 40 MG tablet  ? lisinopril (ZESTRIL) 40 MG tablet  ? amLODipine (NORVASC) 5 MG tablet  ?  ? Endocrine  ? Controlled type 2 diabetes mellitus (HCC) - Primary  ?  Not doing well with A1c of 8.6. Has only been taking 1 of his metformin BID. Will increase to 2 pills BID and recheck in 3 months.  ? ?  ?  ? Relevant Medications  ?  metFORMIN (GLUCOPHAGE) 500 MG tablet  ? lovastatin (MEVACOR) 40 MG tablet  ? lisinopril (ZESTRIL) 40 MG tablet  ? Other Relevant Orders  ? Bayer DCA Hb A1c Waived (Completed)  ?  ? Other  ? Severe recurrent major depression (HCC)  ?  Will start cymbalta to help with pain and mood. Recheck 1 month.  ? ?  ?  ? Relevant Medications  ? DULoxetine (CYMBALTA) 20 MG capsule  ? Chronic  pain  ?  Will start cymbalta to help with pain and mood. Recheck 1 month.  ? ?  ?  ? Relevant Medications  ? DULoxetine (CYMBALTA) 20 MG capsule  ? ?Other Visit Diagnoses   ? ? Abnormal kidney function      ? Rechecking labs today. Await results. Treat as needed.   ? Mixed hyperlipidemia      ? Relevant Medications  ? metoprolol tartrate (LOPRESSOR) 50 MG tablet  ? lovastatin (MEVACOR) 40 MG tablet  ? lisinopril (ZESTRIL) 40 MG tablet  ? amLODipine (NORVASC) 5 MG tablet  ? ?  ?  ? ?Follow up plan: ?Return in about 4 weeks (around 05/29/2022). ? ? ? ? ? ?

## 2022-05-01 NOTE — Assessment & Plan Note (Signed)
Not doing well with A1c of 8.6. Has only been taking 1 of his metformin BID. Will increase to 2 pills BID and recheck in 3 months.  ?

## 2022-05-01 NOTE — Assessment & Plan Note (Signed)
Will start cymbalta to help with pain and mood. Recheck 1 month.  ?

## 2022-05-01 NOTE — Assessment & Plan Note (Signed)
Will start cymbalta to help with pain and mood. Recheck 1 month.  ?

## 2022-06-01 ENCOUNTER — Telehealth: Payer: Medicare HMO

## 2022-06-01 ENCOUNTER — Telehealth: Payer: Self-pay

## 2022-06-01 NOTE — Telephone Encounter (Signed)
  Care Management   Follow Up Note   06/01/2022 Name: KIYOTO SLOMSKI MRN: [id number] DOB: 1956-03-20   Referred by: Dorcas Carrow, DO Reason for referral : Chronic Care Management   An unsuccessful telephone outreach was attempted today. The patient was referred to the case management team for assistance with care management and care coordination.   Follow Up Plan:  Called patient, appeared he was not able to hear anything I was saying. Will have care guide call pt to reschedule. Would like to review financial assistance options.   Dahlia Byes, PharmD, BCGP Clinical Pharmacist  [facility]  [phone]

## 2022-06-01 NOTE — Progress Notes (Deleted)
Chronic Care Management Pharmacy Note  06/01/2022 Name:  Lucas Weiss MRN:  [id number] DOB:  12-05-1956  Summary: -metformin increased to BID (was only taking QD) last PCP OV 04/2022  Recommendations/Changes made from today's visit:  Plan:  Subjective: Lucas Weiss is an 66 y.o. year old male who is a primary patient of [name], DO.  The CCM team was consulted for assistance with disease management and care coordination needs.    {CCMTELEPHONEFACETOFACE:21091510} for {CCMINITIALFOLLOWUPCHOICE:21091511} in response to provider referral for pharmacy case management and/or care coordination services.   Consent to Services:  {CCMCONSENTOPTIONS:25074}  Patient Care Team: [name], DO as PCP - General (Family Medicine) [name], MD as Consulting Physician (Orthopedic Surgery) Vanita Ingles, RN as Case Manager ([place]) [name], [facility] (Pharmacist)  Recent office visits: ***  Recent consult visits: [facility] visits: {Hospital DC Yes/No:21091515}  Objective:  Lab Results  Component Value Date   CREATININE 1.69 (H) 02/27/2022   CREATININE 1.25 01/30/2022   CREATININE 1.24 07/08/2021    Lab Results  Component Value Date   HGBA1C 8.6 (H) 05/01/2022   Last diabetic Eye exam: No results found for: "HMDIABEYEEXA"  Last diabetic Foot exam: No results found for: "HMDIABFOOTEX"      Component Value Date/Time   CHOL 230 (H) 01/30/2022 1500   TRIG 366 (H) 01/30/2022 1500   HDL 41 01/30/2022 1500   CHOLHDL 6.3 07/17/2011 0715   VLDL 48 (H) 07/17/2011 0715   LDLCALC 124 (H) 01/30/2022 1500       Latest Ref Rng & Units 01/30/2022    3:00 PM 07/08/2021    2:04 PM 09/04/2019    1:40 PM  Hepatic Function  Total Protein 6.0 - 8.5 g/dL 6.7  6.6  6.7   Albumin 3.8 - 4.8 g/dL 3.7  4.2  4.2   AST 0 - 40 IU/L 23  22  36   ALT 0 - 44 IU/L 26  26  40   Alk Phosphatase 44 - 121 IU/L 118  106  78   Total Bilirubin 0.0 - 1.2 mg/dL  <0.2  0.2  0.3     Lab Results  Component Value Date/Time   TSH 0.792 01/30/2022 03:00 PM   TSH 1.440 10/08/2020 03:05 PM       Latest Ref Rng & Units 01/30/2022    3:00 PM 07/08/2021    2:04 PM 10/08/2020    3:05 PM  CBC  WBC 3.4 - 10.8 x10E3/uL 7.8  6.6  7.1   Hemoglobin 13.0 - 17.7 g/dL 12.6  11.8  11.7   Hematocrit 37.5 - 51.0 % 39.0  35.4  36.7   Platelets 150 - 450 x10E3/uL 198  198  327     No results found for: "VD25OH"  Clinical ASCVD: {YES/NO:21197} The ASCVD Risk score (Arnett DK, et al., 2019) failed to calculate for the following reasons:   The patient has a prior MI or stroke diagnosis    Other: (CHADS2VASc if Afib, PHQ9 if depression, MMRC or CAT for COPD, ACT, DEXA)  Social History   Tobacco Use  Smoking Status Former   Packs/day: 1.50   Years: 30.00   Total pack years: 45.00   Types: Cigarettes   Quit date: 07/19/2008   Years since quitting: 13.8  Smokeless Tobacco Never   BP Readings from Last 3 Encounters:  05/01/22 112/62  02/27/22 122/70  01/30/22 (!) 168/78   Pulse Readings from Last  3 Encounters:  05/01/22 75  02/27/22 76  01/30/22 66   Wt Readings from Last 3 Encounters:  05/01/22 182 lb 9.6 oz (82.8 kg)  02/27/22 186 lb (84.4 kg)  01/30/22 182 lb (82.6 kg)    Assessment: Review of patient past medical history, allergies, medications, health status, including review of consultants reports, laboratory and other test data, was performed as part of comprehensive evaluation and provision of chronic care management services.   SDOH:  (Social Determinants of Health) assessments and interventions performed:    CCM Care Plan  No Known Allergies  Medications Reviewed Today     Reviewed by [name], DO (Physician) on 05/01/22 at 1521  Med List Status: <None>   Medication Order Taking? Sig Documenting Provider Last Dose Status Informant  amLODipine (NORVASC) 5 MG tablet [id number] Yes Take 1 tablet (5 mg total) by mouth daily.  Park Liter P, DO Taking Active   aspirin EC 81 MG tablet [id number] Yes Take 81 mg by mouth daily. [provider] Taking Active   Blood Glucose Monitoring Suppl (ONETOUCH VERIO) w/Device KIT [id number] Yes To check blood sugar twice a day and document, bring to provider visits. Marnee Guarneri T, NP Taking Active   dapagliflozin propanediol (FARXIGA) 10 MG TABS tablet [id number] No Take 1 tablet (10 mg total) by mouth daily before breakfast.  Patient not taking: Reported on 05/01/2022   [name], DO Not Taking Active   glucose blood (ONETOUCH VERIO) test strip [id number] No To check blood sugar twice a day and document, bring to provider visits.  Patient not taking: Reported on 05/01/2022   Venita Lick, NP Not Taking Active   Lancets [facility] ULTRASOFT) lancets [id number] No To check blood sugar twice a day and document, bring to provider visits.  Patient not taking: Reported on 05/01/2022   Marnee Guarneri T, NP Not Taking Active   lisinopril (ZESTRIL) 40 MG tablet [id number] Yes Take 1 tablet (40 mg total) by mouth daily. Wynetta Emery, Megan P, DO Taking Active   lovastatin (MEVACOR) 40 MG tablet [id number] Yes Take 1 tablet (40 mg total) by mouth at bedtime. Park Liter P, DO Taking Active   metFORMIN (GLUCOPHAGE) 500 MG tablet [id number] Yes TAKE 2 TABLETS BY MOUTH TWO TIMES A DAY WITH FOOD Johnson, Megan P, DO Taking Active   metoprolol tartrate (LOPRESSOR) 50 MG tablet [id number] Yes Take 1 tablet (50 mg total) by mouth daily. [name], DO Taking Active             Patient Active Problem List   Diagnosis Date Noted   Multiple rib fractures 10/08/2020   Insomnia 05/06/2017   History of left hip replacement 05/19/2016   Controlled type 2 diabetes mellitus ([place]) 01/09/2016   Tobacco abuse, in remission 01/09/2016   Severe recurrent major depression ([place]) 06/14/2015   Chronic pain 06/14/2015   Alcohol abuse, in remission    Atherosclerotic PVD with  intermittent claudication ([place]) 10/25/2014   Hypertension associated with diabetes ([place]) 09/17/2014   Renal artery stenosis, native, bilateral ([place]) 02/20/2013   CAD (coronary artery disease) 09/16/2012   PAD (peripheral artery disease): Left common iliac stent plus additional stent(09/15/12) for ISRS. 09/16/2012   S/P CABG x 5: 2009: LIMA to LAD, SVG to Diag, Seq SVG to OM2-3, SVG to PDA 09/16/2012   Hypertensive heart failure ([place]) 09/16/2012   Hyperlipidemia associated with type 2 diabetes mellitus ([place]) 09/16/2012    Immunization History  Administered Date(s) Administered   Influenza  Split 09/16/2012   Influenza,inj,Quad PF,6+ Mos 09/06/2015, 09/14/2016, 09/09/2017, 10/17/2018, 09/04/2019, 10/04/2020, 10/22/2021   Influenza-Unspecified 09/12/2014   Pneumococcal Polysaccharide-23 05/28/2010, 05/20/2016   Td 12/27/2020   Tdap 05/28/2010    Conditions to be addressed/monitored: {CCM ASSESSMENT DISEASE OPTIONS:25047}  There are no care plans that you recently modified to display for this patient. Current Barriers:  Unable to independently afford treatment regimen Unable to independently monitor therapeutic efficacy Unable to achieve control of DM   Pharmacist Clinical Goal(s):  Patient will verbalize ability to afford treatment regimen achieve adherence to monitoring guidelines and medication adherence to achieve therapeutic efficacy contact provider office for questions/concerns as evidenced notation of same in electronic health record through collaboration with PharmD and provider.   Interventions: 1:1 collaboration with [name], DO regarding development and update of comprehensive plan of care as evidenced by provider attestation and co-signature Inter-disciplinary care team collaboration (see longitudinal plan of care) Comprehensive medication review performed; medication list updated in electronic medical record  Hypertension (BP goal <140/90) -Not ideally  controlled -Current treatment: Metoprolol tartrate 50 mg twice daily (pt reported 09/09/21) Lisinopril 40 mg daily  Amlodipine 5 mg twice daily (pt reported 09/09/21) -Current home readings: reports some >150/90 recently, has had arthritis pain and is in the process of moving out of trailer.  -Reviewed home BP monitoring techniques -Denies hypotensive/hypertensive symptoms -Educated on BP goals and benefits of medications for prevention of heart attack, stroke and kidney damage; Proper BP monitoring technique; -Counseled to monitor BP at home 2-3x/wk, document, and provide log at future appointments -Counseled on diet and exercise extensively Recommended to continue current medication Agreeable to seeing new cardiologist per preference stated above  Hyperlipidemia: (LDL goal < 100) -Not ideally controlled -Secondary prevention  -Current treatment: Lovastatin 40 mg once daily  {Appropriate:26852::"Appropriate"}, {Effective:26853::"Effective"}, {Safe:26854::"Safe"}, {accessible:26855::"Accessible"}  -Reviewed tolerability/side effects - no problems noted. -On chart review hx or atorvastatin and rosuvastatin noted. No recall of side effects.  -Educated on Cholesterol goals;  Benefits of statin for ASCVD risk reduction; -Consider switch to high intensity statin, atorvastatin 80 mg - next PCP visit 10/20   Diabetes (A1c goal <7%) -Not ideally controlled -GFR >70 -Current medications: Jardiance 25 mg once daily (Not taking) Metformin 500 mg tabs - two tabs (1082m) twice daily  -Medications previously tried: Glipizide, metformin XR  -Current home glucose readings fasting glucose:  not reported post prandial glucose: not reported  -Denies hypoglycemic/hyperglycemic symptoms -Current meal patterns: low carb -Current exercise: work around the house - in the process of moving. -Educated on A1c and blood sugar goals; Benefits of routine self-monitoring of blood sugar; -Counselfaaed to  check feet daily and get yearly eye exams -Assessed finances - will [facility] within range -Will review [facility] due to availability of voucher  Health Maintenance -Gaps:   Colon cancer screening:   Colonoscopy 2010, FOBT last done >184yrEye and foot exam: due for eye, foot 10/08/2021 -Counseled on diet and exercise extensively  Medication Assistance: CPA to call pt regarding need to apply for LIS and contact [facility] [phone]'s preferred [facility] is:  [facility] [id number] [address]one: [phone]: [phone] Follow Up:  Patient agrees to Care Plan and Follow-up. Plan: HC contact patient regarding [facility] 1 month f/u on LIS/PAP. Pharmacist 3 month f/u  Medication Assistance: {MEDASSISTANCEINFO:25044}  Patient's preferred  pharmacy is:  [facility] [id number] - [address], [place] [address] Phone: [phone] Fax: [phone]  Uses pill box? {Yes or If no, why not?:20788} Pt endorses ***% compliance  Follow Up:  {FOLLOWUP:24991}  Plan: *** Future Appointments  Date Time Provider [place]  06/01/2022  2:00 PM CFP C[facility] PHARMACY [facility]  06/02/2022  3:40 PM [name], DO [facility] P[facility]   [name], PharmD, [facility] Clinical Pharmacist  [facility]  [phone]

## 2022-06-02 ENCOUNTER — Encounter: Payer: Self-pay | Admitting: Family Medicine

## 2022-06-02 ENCOUNTER — Ambulatory Visit (INDEPENDENT_AMBULATORY_CARE_PROVIDER_SITE_OTHER): Payer: Medicare HMO | Admitting: Family Medicine

## 2022-06-02 VITALS — BP 126/68 | HR 72 | Temp 97.9°F | Wt 183.0 lb

## 2022-06-02 DIAGNOSIS — F332 Major depressive disorder, recurrent severe without psychotic features: Secondary | ICD-10-CM | POA: Diagnosis not present

## 2022-06-02 DIAGNOSIS — G44209 Tension-type headache, unspecified, not intractable: Secondary | ICD-10-CM | POA: Diagnosis not present

## 2022-06-02 DIAGNOSIS — G894 Chronic pain syndrome: Secondary | ICD-10-CM

## 2022-06-02 DIAGNOSIS — R69 Illness, unspecified: Secondary | ICD-10-CM | POA: Diagnosis not present

## 2022-06-02 MED ORDER — KETOROLAC TROMETHAMINE 60 MG/2ML IM SOLN
60.0000 mg | Freq: Once | INTRAMUSCULAR | Status: AC
  Administered 2022-06-02: 60 mg via INTRAMUSCULAR

## 2022-06-02 MED ORDER — DULOXETINE HCL 40 MG PO CPEP: 40.0000 mg | ORAL_CAPSULE | Freq: Every day | ORAL | 2 refills | Status: DC

## 2022-06-02 NOTE — Progress Notes (Signed)
BP 126/68   Pulse 72   Temp 97.9 F (36.6 C)   Wt 183 lb (83 kg)   SpO2 99%   BMI 25.52 kg/m    Subjective:    Patient ID: Lucas Weiss, male    DOB: 1956/02/14, 66 y.o.   MRN: [id number]  HPI: Lucas Weiss is a 66 y.o. male  Chief Complaint  Patient presents with   Depression   Pain   Headache    Patient states he took sinus medication this morning because he had a headache. Patient states headache is getting better   Woke up with a headache his AM. No cold symptoms. Feeling better with medicine, but lingering.   Pain in his back is better with cymbalta. Feeling like he can move a little faster. No as severe pain.   DEPRESSION Mood status: better Satisfied with current treatment?: no Symptom severity: moderate  Duration of current treatment :  1 month Side effects: no Medication compliance: excellent compliance Psychotherapy/counseling: no  Previous psychiatric medications: seroquel, cymbalta Depressed mood: yes Anxious mood: no Anhedonia: no Significant weight loss or gain: no Insomnia: no  Fatigue: yes Feelings of worthlessness or guilt: no Impaired concentration/indecisiveness: no Suicidal ideations: no Hopelessness: no Crying spells: no    05/01/2022    3:08 PM 01/30/2022    3:20 PM 08/27/2021   12:19 PM 01/09/2020    1:24 PM 07/31/2019    1:02 PM  Depression screen PHQ 2/9  Decreased Interest  0 0 0 0  Down, Depressed, Hopeless  0 0 0 0  PHQ - 2 Score  0 0 0 0  Difficult doing work/chores Not difficult at all        Relevant past medical, surgical, family and social history reviewed and updated as indicated. Interim medical history since our last visit reviewed. Allergies and medications reviewed and updated.  Review of Systems  Constitutional: Negative.   HENT:  Positive for congestion, rhinorrhea, sinus pressure, sinus pain and sneezing. Negative for dental problem, drooling, ear discharge, ear pain, facial swelling, hearing loss, mouth sores,  nosebleeds, postnasal drip, sore throat, tinnitus, trouble swallowing and voice change.   Respiratory: Negative.    Cardiovascular: Negative.   Gastrointestinal: Negative.     Per HPI unless specifically indicated above     Objective:    BP 126/68   Pulse 72   Temp 97.9 F (36.6 C)   Wt 183 lb (83 kg)   SpO2 99%   BMI 25.52 kg/m   Wt Readings from Last 3 Encounters:  06/02/22 183 lb (83 kg)  05/01/22 182 lb 9.6 oz (82.8 kg)  02/27/22 186 lb (84.4 kg)    Physical Exam Vitals and nursing note reviewed.  Constitutional:      General: He is not in acute distress.    Appearance: Normal appearance. He is not ill-appearing, toxic-appearing or diaphoretic.  HENT:     Head: Normocephalic and atraumatic.     Right Ear: External ear normal.     Left Ear: External ear normal.     Nose: Nose normal.     Mouth/Throat:     Mouth: Mucous membranes are moist.     Pharynx: Oropharynx is clear.  Eyes:     General: No scleral icterus.       Right eye: No discharge.        Left eye: No discharge.     Extraocular Movements: Extraocular movements intact.     Conjunctiva/sclera: Conjunctivae normal.  Pupils: Pupils are equal, round, and reactive to light.  Cardiovascular:     Rate and Rhythm: Normal rate and regular rhythm.     Pulses: Normal pulses.     Heart sounds: Normal heart sounds. No murmur heard.    No friction rub. No gallop.  Pulmonary:     Effort: Pulmonary effort is normal. No respiratory distress.     Breath sounds: Normal breath sounds. No stridor. No wheezing, rhonchi or rales.  Chest:     Chest wall: No tenderness.  Musculoskeletal:        General: Normal range of motion.     Cervical back: Normal range of motion and neck supple.  Skin:    General: Skin is warm and dry.     Capillary Refill: Capillary refill takes less than 2 seconds.     Coloration: Skin is not jaundiced or pale.     Findings: No bruising, erythema, lesion or rash.  Neurological:      General: No focal deficit present.     Mental Status: He is alert and oriented to person, place, and time. Mental status is at baseline.  Psychiatric:        Mood and Affect: Mood normal.        Behavior: Behavior normal.        Thought Content: Thought content normal.        Judgment: Judgment normal.     Results for orders placed or performed in visit on 05/01/22  Bayer DCA Hb A1c Waived  Result Value Ref Range   HB A1C (BAYER DCA - WAIVED) 8.6 (H) 4.8 - 5.6 %      Assessment & Plan:   Problem List Items Addressed This Visit       Other   Severe recurrent major depression (HCC) - Primary    Refuses PHQ9 but feeling better on cymablta. Will increase to 40mg  and recheck in 2 months when we recheck A1c. Call with any concerns. Refills given.       Relevant Medications   DULoxetine 40 MG CPEP   Chronic pain    Back is feeling better on cymablta. Will increase to 40mg  and recheck in 2 months when we recheck A1c. Call with any concerns. Refills given.       Relevant Medications   ketorolac (TORADOL) injection 60 mg (Start on 06/02/2022  4:15 PM)   DULoxetine 40 MG CPEP   Other Visit Diagnoses     Acute non intractable tension-type headache       Will treat with toradol shot. Call with any concerns. Continue to monitor.    Relevant Medications   ketorolac (TORADOL) injection 60 mg (Start on 06/02/2022  4:15 PM)   DULoxetine 40 MG CPEP        Follow up plan: Return in about 2 months (around 08/02/2022).

## 2022-06-02 NOTE — Assessment & Plan Note (Signed)
Back is feeling better on cymablta. Will increase to 40mg  and recheck in 2 months when we recheck A1c. Call with any concerns. Refills given.

## 2022-06-02 NOTE — Assessment & Plan Note (Signed)
Refuses PHQ9 but feeling better on cymablta. Will increase to 40mg  and recheck in 2 months when we recheck A1c. Call with any concerns. Refills given.

## 2022-07-06 ENCOUNTER — Ambulatory Visit (INDEPENDENT_AMBULATORY_CARE_PROVIDER_SITE_OTHER): Payer: Medicare HMO

## 2022-07-06 DIAGNOSIS — E1165 Type 2 diabetes mellitus with hyperglycemia: Secondary | ICD-10-CM

## 2022-07-06 DIAGNOSIS — I110 Hypertensive heart disease with heart failure: Secondary | ICD-10-CM

## 2022-07-06 NOTE — Patient Instructions (Signed)
Visit Information   Goals Addressed   None    Patient Care Plan: RNCMN: Diabetes Type 2 (Adult)     Problem Identified: RNCM: Glycemic Management (Diabetes, Type 2)   Priority: High     Long-Range Goal: RNCM: Glycemic Management Optimized   Start Date: 01/22/2021  Expected End Date: 08/27/2022  This Visit's Progress: Not on track  Priority: High  Note:   .Objective:  Lab Results  Component Value Date   HGBA1C 7.3 (H) 07/08/2021     Lab Results  Component Value Date   CREATININE 1.24 07/08/2021   CREATININE 1.12 10/08/2020   CREATININE 1.07 02/29/2020    No results found for: EGFR Current Barriers:  Knowledge Deficits related to basic Diabetes pathophysiology and self care/management Knowledge Deficits related to medications used for management of diabetes Difficulty obtaining or cannot afford medications Does not have glucometer to monitor blood sugar Financial Constraints Cognitive Deficits Limited Social Support Unable to independently manage DM Unable to self administer medications as prescribed Does not adhere to provider recommendations re: checking blood sugars and medications as prescribed  Does not adhere to prescribed medication regimen Lacks social connections Does not maintain contact with provider office Does not contact provider office for questions/concerns  Case Manager Clinical Goal(s):  patient will demonstrate improved adherence to prescribed treatment plan for diabetes self care/management as evidenced by: daily monitoring and recording of CBG  adherence to ADA/ carb modified diet exercise 3/4 days/week adherence to prescribed medication regimen Interventions:  Collaboration with [name], DO regarding development and update of comprehensive plan of care as evidenced by provider attestation and co-signature Inter-disciplinary care team collaboration (see longitudinal plan of care) Provided education to patient about basic DM disease process.  05/21/2021 Reviewed education on S&S of hypoglycemia since patient has not been eating well over the last 5 days due to a GI issue: diarrhea with blood present and black stools.  Symptoms have relieved today and no further diarrhea or black stools.  Instructed patient to contact [facility] office if GI symptoms return. Reviewed medications with patient and discussed importance of medication adherence.  05/21/2021 Patient not able to afford Jardiance and has not started on it. Patient asking for different medication for diabetes that he can afford. 08-27-2021: The patient still not taking jardiance due to cost constraints. The patient states he is taking Metformin. Will re-refer the patient to pharm D for assistance.  Discussed plans with patient for ongoing care management follow up and provided patient with direct contact information for care management team Provided patient with written educational materials related to hypo and hyperglycemia and importance of correct treatment Reviewed scheduled/upcoming provider appointments including: 10-09-2021 at 3 pm Advised patient, providing education and rationale, to check cbg daily  and record, calling pcp for findings outside established parameters.  08-27-2021: The patient is not checking his blood sugars every day but usually when he does it is afternoon or night. He states his readings are in the 300's and sometimes well over 300's. Denies any lows. The patient educated on fasting blood sugar goal of <130 and post prandial of <180.  The patient states he just can't afford Jardiance. Will collaborate with the pcp and pharm D.  Patient did obtain a glucose meter and is now checking his blood sugars once a week (usually in the evenings) with range in the lower 300 (328-329).  Patient checked blood glucose during contact with result 184.  Patient not taking Jardiance due to inability to purchase because  of how expensive medication is. Patient asking for different medication for  diabetes that he can afford. Patient is awaiting pharmacy contact to help with possibly obtaining Jardiance. 08-27-2021: Currently is still taking Metformin but not Jardiance. Would consider a cheaper alternatives.  Review of patient status, including review of consultants reports, relevant laboratory and other test results, and medications completed. Patient Goals/Self-Care Activities  patient will:  - UNABLE to independently manage DM Self administers oral medications as prescribed Attends all scheduled provider appointments Checks blood sugars as prescribed and utilize hyper and hypoglycemia protocol as needed Adheres to prescribed ADA/carb modified- currently is not following a restricted diet. Discussed referral for care guides but the patient refuses at this time. Encouraged compliance  - barriers to adherence to treatment plan identified - blood glucose monitoring encouraged - resources required to improve adherence to care identified - self-awareness of signs/symptoms of hypo or hyperglycemia encouraged  Follow Up Plan: Telephone follow up appointment with care management team member scheduled for:  10-21-2021 at 11:45am    Task: RNCM: Alleviate Barriers to Glycemic Management Completed 08/27/2021  Outcome: Positive  Note:   Care Management Activities:    - barriers to adherence to treatment plan identified - blood glucose monitoring encouraged - resources required to improve adherence to care identified - self-awareness of signs/symptoms of hypo or hyperglycemia encouraged    Notes: the patient currently does not have a glucose meter. Working the [employer] D for assistance with meter.     Patient Care Plan: RNCM: Hypertension (Adult)     Problem Identified: RNCM: Hypertension (Hypertension)   Priority: Medium     Long-Range Goal: RNCM: Hypertension Monitored   Start Date: 01/22/2021  Expected End Date: 06/09/2022  This Visit's Progress: On track  Priority: Medium  Note:    Objective:  Last practice recorded BP readings:  BP Readings from Last 3 Encounters:  07/11/21 140/60  07/08/21 132/64  03/27/21 (!) 159/89    Most recent eGFR/CrCl: No results found for: EGFR  No components found for: CRCL Current Barriers:  Knowledge Deficits related to basic understanding of hypertension pathophysiology and self care management Knowledge Deficits related to understanding of medications prescribed for management of hypertension Difficulty obtaining medications Non-adherence to prescribed medication regimen Cognitive Deficits Unable to independently manage HTN Lacks social connections Does not contact provider office for questions/concerns Case Manager Clinical Goal(s):   patient will verbalize understanding of plan for hypertension management  patient will attend all scheduled medical appointments: 10-09-2021 at 3 pm  patient will demonstrate improved adherence to prescribed treatment plan for hypertension as evidenced by taking all medications as prescribed, monitoring and recording blood pressure as directed, adhering to low sodium/DASH diet  patient will demonstrate improved health management independence as evidenced by checking blood pressure as directed and notifying PCP if SBP>160 or DBP > 90, taking all medications as prescribe, and adhering to a low sodium diet as discussed.  patient will verbalize basic understanding of hypertension disease process and self health management plan as evidenced by compliance with heart healthy diet, compliance with medications, working with the CCM team to manage health and well being.  Interventions:  Collaboration with [name], DO regarding development and update of comprehensive plan of care as evidenced by provider attestation and co-signature Inter-disciplinary care team collaboration (see longitudinal plan of care) Evaluation of current treatment plan related to hypertension self management and patient's adherence  to plan as established by provider. 05/21/2021: Patient not consistent with monitoring blood pressure.  Patient checks blood pressure about once a month with systolic number around 941/?.  Patient states he can tell when his blood pressure is elevated or low. 08-27-2021: The patient is doing well with management of blood pressures. Did follow up with the cardiologist after complaint of CP. The patient states the cardiologist wanted him to have an ECHO but he cancelled it. He denies any further episodes of CP or discomfort. Feels the CP came from when he had COVID. Review of what worse looks like and when to seek emergent care.  Provided education to patient re: stroke prevention, s/s of heart attack and stroke, DASH diet, complications of uncontrolled blood pressure.  08-27-2021 Reviewed this education with patient. Reviewed medications with patient and discussed importance of compliance. 08-27-2021: The patient states compliance with medications for HTN management  Discussed plans with patient for ongoing care management follow up and provided patient with direct contact information for care management team Advised patient, providing education and rationale, to monitor blood pressure daily and record, calling PCP for findings outside established parameters.  Reviewed scheduled/upcoming provider appointments including: 10-09-2021 at 3 pm Patient Goals/Self-Care Activities  patient will:  - Self administers medications as prescribed Attends all scheduled provider appointments Calls provider office for new concerns, questions, or BP outside discussed parameters Checks BP and records as discussed Follows a low sodium diet/DASH diet - depression screen reviewed - home or ambulatory blood pressure monitoring encouraged Follow Up Plan: Telephone follow up appointment with care management team member scheduled for: 10-21-2021 at 11:45 am    Task: RNCM: Identify and Monitor Blood Pressure Elevation Completed  08/27/2021  Outcome: Positive  Note:   Care Management Activities:    - depression screen reviewed - home or ambulatory blood pressure monitoring encouraged        Patient Care Plan: RNCM: Coronary Artery Disease (Adult) and HLD     Problem Identified: RNCM: Disease Progression (Coronary Artery Disease) and HLD   Priority: Medium     Long-Range Goal: RNCM: Disease Progression Prevented or Minimizeda; CAD and HLD   Start Date: 01/22/2021  Expected End Date: 06/09/2022  This Visit's Progress: On track  Priority: Medium  Note:   Current Barriers:  Poorly controlled hyperlipidemia, complicated by non-compliance with dietary restrictions, cost constraints, chronic conditions  Current antihyperlipidemic regimen: Lovastatin 20 mg QD Most recent lipid panel:  Lab Results  Component Value Date   CHOL 177 07/08/2021   CHOL 179 10/08/2020   CHOL 201 (H) 09/04/2019   Lab Results  Component Value Date   HDL 37 (L) 07/08/2021   HDL 38 (L) 10/08/2020   HDL 37 (L) 09/04/2019   Lab Results  Component Value Date   LDLCALC 89 07/08/2021   [place] 96 10/08/2020   LDLCALC 100 (H) 09/04/2019   Lab Results  Component Value Date   TRIG 309 (H) 07/08/2021   TRIG 269 (H) 10/08/2020   TRIG 377 (H) 09/04/2019   Lab Results  Component Value Date   CHOLHDL 6.3 07/17/2011   CHOLHDL 6.7 07/19/2008   No results found for: LDLDIRECT  ASCVD risk enhancing conditions: age 61, DM, HTN, past history of stroke, former smoker Unable to self administer medications as prescribed Does not adhere to prescribed medication regimen Lacks social connections Does not contact provider office for questions/concerns  RN Care Manager Clinical Goal(s):   patient will work with Consulting civil engineer, providers, and care team towards execution of optimized self-health management plan  patient will verbalize understanding of plan  for effective management of HLD and CAD  patient will work with [facility] [place] team and pcp  to address needs related to HLD and CAD  patient will attend all scheduled medical appointments: 10-09-2021  Interventions: Collaboration with [name], DO regarding development and update of comprehensive plan of care as evidenced by provider attestation and co-signature Inter-disciplinary care team collaboration (see longitudinal plan of care) Medication review performed; medication list updated in electronic medical record.  Inter-disciplinary care team collaboration (see longitudinal plan of care) Referred to pharmacy team for assistance with HLD medication management Evaluation of current treatment plan related to HLD and CAD and patient's adherence to plan as established by provider. 08-27-2021: The patient feels he is doing better since getting COVID. States he is eating good. Is a little stressed because he is going to have to move. Someone bought the property his trailer is on and he is looking for someone to move his mobile home. He has 3 weeks to move it. He decline care guide referral.  The patient has the [facility] contact information to call if he changes his mind. Will continue to monitor for changes or new needs.  Advised patient to call the office for changes in condition or question  Provided education to patient re: benefits of heart health diet and following recommendations of the provider. 08-27-2021 Discussed with patient importance of heart healthy diet.  Patient states it is hard to get good healthy foods due to the cost of them. Reviewed scheduled/upcoming provider appointments including: 10-09-2021 Discussed plans with patient for ongoing care management follow up and provided patient with direct contact information for care management team Patient Goals/Self-Care Activities: patient will:   - call for medicine refill 2 or 3 days before it runs out - call if I am sick and can't take my medicine - keep a list of all the medicines I take; vitamins and herbals too - learn to  read medicine labels - use a pillbox to sort medicine - use an alarm clock or phone to remind me to take my medicine - change to whole grain breads, cereal, pasta - drink 6 to 8 glasses of water each day - eat 3 to 5 servings of fruits and vegetables each day - eat 5 or 6 small meals each day - fill half the plate with nonstarchy vegetables - keep a food diary - limit fast food meals to no more than 1 per week - manage portion size - prepare main meal at home 3 to 5 days each week - read food labels for fat, fiber, carbohydrates and portion size - be open to making changes - I can manage, know and watch for signs of a heart attack - if I have chest pain, call for help - learn about small changes that will make a big difference - learn my personal risk factors  - barriers to treatment adherence reviewed and addressed - difficulty of making life-long changes acknowledged - functional limitation screening reviewed - healthy lifestyle promoted - individualized medical nutrition therapy provided - medication-adherence assessment completed - response to pharmacologic therapy monitored - self-awareness of signs/symptoms of worsening disease encouraged  Follow Up Plan: Telephone follow up appointment with care management team member scheduled for: 10-21-2021 at 11:45am      Task: RNCM: Alleviate Barriers to Coronary Artery Disease Therapy Completed 08/27/2021  Outcome: Positive  Note:   Care Management Activities:    - barriers to treatment adherence reviewed and addressed - difficulty of making  life-long changes acknowledged - functional limitation screening reviewed - healthy lifestyle promoted - individualized medical nutrition therapy provided - medication-adherence assessment completed - response to pharmacologic therapy monitored - self-awareness of signs/symptoms of worsening disease encouraged        Patient Care Plan: RNCM: Stroke (Adult)     Problem Identified:  RNCM: Emotional Adjustment to Disease (Stroke)   Priority: Medium     Long-Range Goal: RNCM: Optimal Coping   Start Date: 01/22/2021  Expected End Date: 08/09/2022  This Visit's Progress: On track  Priority: Medium  Note:   Current Barriers:  Knowledge Deficits related to resources to help with expressed needs for patient with chronic conditions and post residual stroke management  Chronic Disease Management support and education needs related to past history of stroke  Financial Constraints.  Cognitive Deficits Unable to self administer medications as prescribed Does not adhere to provider recommendations re: dietary restrictions  Does not adhere to prescribed medication regimen Lacks social connections Does not contact provider office for questions/concerns  Nurse Case Manager Clinical Goal(s):   patient will verbalize understanding of plan for effective management of post residual stroke  patient will work with [facility], CCM team, and pcp to address needs related to meeting patients needs due to cognitive concerns and changes in condition   patient will attend all scheduled medical appointments: 03-27-2021  Interventions:  1:1 collaboration with [name], DO regarding development and update of comprehensive plan of care as evidenced by provider attestation and co-signature Inter-disciplinary care team collaboration (see longitudinal plan of care) Evaluation of current treatment plan related to post residual stroke management  and patient's adherence to plan as established by provider. 08-27-2021: the patient states that he still has issues with his memory since having his stroke but he is doing the best he can. Has been through a lot with losing his wife, home, businesses, and livelihood.  States that he is going to have to move because the property was sold. He is trying to find someone now that can move his mobile home to a place on [address].  He states he is doing okay but has  had a rough time. Declined getting a new ECHO after his CP resolved and he did not feel the need to  have it any longer.  He is managing the best he can. His sister tries to help him with insurance and things like that but it actually was worse than what he had. He doesn't want her to be upset with him so he has kept it. The granddaughter is a good support system for him.  Denies any acute needs at this time. .  Advised patient to call the office for changes in condition or new questions  Provided education to patient re: following plan of care, adherence to a heart healthy/ADA diet, safety, and compliance with medications. 08-27-2021: Reviewed with the patient dietary restrictions, compliance with medications and working with the CCM team to meet health and wellness needs.  Discussed plans with patient for ongoing care management follow up and provided patient with direct contact information for care management team  Patient Goals/Self-Care Activities  patient will:  - Patient will self administer medications as prescribed Patient will attend all scheduled provider appointments Patient will call pharmacy for medication refills Patient will attend church or other social activities Patient will continue to perform ADL's independently Patient will continue to perform IADL's independently Patient will call provider office for new concerns or questions Patient will work  with BSW to address care coordination needs and will continue to work with the clinical team to address health care and disease management related needs.   - counseling provided - decision-making supported - depression screen reviewed - goal-setting facilitated - positive reinforcement provided - problem-solving facilitated - relaxation techniques promoted - self-care encouraged - self-reflection promoted - verbalization of feelings encouraged  Follow Up Plan: Telephone follow up appointment with care management team member  scheduled for: 10-21-2021 at 1145 am         Task: RNCM: Support Psychosocial Response to Stroke Completed 08/27/2021  Outcome: Positive  Note:   Care Management Activities:    - counseling provided - decision-making supported - depression screen reviewed - goal-setting facilitated - positive reinforcement provided - problem-solving facilitated - relaxation techniques promoted - self-care encouraged - self-reflection promoted - verbalization of feelings encouraged        Patient Care Plan: CCM Pharmacy Care plan     Problem Identified: DM, HTN, HLD   Priority: High     Long-Range Goal: Disease management   Start Date: 09/09/2021  Expected End Date: 09/09/2022  Recent Progress: Not on track  Priority: High  Note:   Current Barriers:  Unable to independently afford treatment regimen Unable to independently monitor therapeutic efficacy Unable to achieve control of DM   Pharmacist Clinical Goal(s):  Patient will verbalize ability to afford treatment regimen achieve adherence to monitoring guidelines and medication adherence to achieve therapeutic efficacy contact provider office for questions/concerns as evidenced notation of same in electronic health record through collaboration with PharmD and provider.   Interventions: 1:1 collaboration with [name], DO regarding development and update of comprehensive plan of care as evidenced by provider attestation and co-signature Inter-disciplinary care team collaboration (see longitudinal plan of care) Comprehensive medication review performed; medication list updated in electronic medical record  Hypertension (BP goal <140/90) BP Readings from Last 3 Encounters:  06/02/22 126/68  05/01/22 112/62  02/27/22 122/70  -Controlled -Current treatment: Metoprolol tartrate 50 mg twice daily Appropriate, Effective, Safe, Accessible Lisinopril 40 mg daily Appropriate, Effective, Safe, Accessible Amlodipine 5 mg twice daily  Appropriate, Effective, Safe, Accessible -Current home readings: reports some >150/90 recently, has had arthritis pain and is in the process of moving out of trailer.  -Reviewed home BP monitoring techniques -Denies hypotensive/hypertensive symptoms -Educated on BP goals and benefits of medications for prevention of heart attack, stroke and kidney damage; Proper BP monitoring technique; -Counseled to monitor BP at home 2-3x/wk, document, and provide log at future appointments -Counseled on diet and exercise extensively Recommended to continue current medication  Hyperlipidemia: (LDL goal < 100) The ASCVD Risk score (Arnett DK, et al., 2019) failed to calculate for the following reasons:   The patient has a prior MI or stroke diagnosis Lab Results  Component Value Date   CHOL 230 (H) 01/30/2022   CHOL 177 07/08/2021   CHOL 179 10/08/2020   Lab Results  Component Value Date   HDL 41 01/30/2022   HDL 37 (L) 07/08/2021   HDL 38 (L) 10/08/2020   Lab Results  Component Value Date   LDLCALC 124 (H) 01/30/2022   LDLCALC 89 07/08/2021   LDLCALC 96 10/08/2020   Lab Results  Component Value Date   TRIG 366 (H) 01/30/2022   TRIG 309 (H) 07/08/2021   TRIG 269 (H) 10/08/2020   Lab Results  Component Value Date   CHOLHDL 6.3 07/17/2011   CHOLHDL 6.7 07/19/2008  No results found for: "LDLDIRECT" -  Not ideally controlled -Secondary prevention  -Current treatment: Lovastatin 40 mg once daily Query Appropriate -Reviewed tolerability/side effects - no problems noted. -On chart review hx or atorvastatin and rosuvastatin noted. No recall of side effects.  -Educated on Cholesterol goals;  Benefits of statin for ASCVD risk reduction; July 2023: Consider switch to high intensity statin, atorvastatin 80 mg - next PCP visit 10/20   Diabetes (A1c goal <7%) -Not ideally controlled -GFR >70 -Current medications: Metformin 500 mg tabs - two tabs ($RemoveB'1000mg'hoiViOxr$ ) twice daily Appropriate, Effective,  Query Safe,  -Medications previously tried: Glipizide, Museum/gallery curator (Cost) -Current home glucose readings fasting glucose:  not reported post prandial glucose: not reported  -Denies hypoglycemic/hyperglycemic symptoms -Current meal patterns: low carb -Current exercise: work around the house - in the process of moving. -Educated on A1c and blood sugar goals; Benefits of routine self-monitoring of blood sugar; July 2023: Patient states he doesn't check sugars. Metformin upsets his stomach. Will ask PCP about changing to ER and about Farxiga PAP   F/U: CPP F/U Sept 2023  [place], [place].D. - [phone]      Mr. Mayotte was given information about Chronic Care Management services today including:  CCM service includes personalized support from designated clinical staff supervised by his physician, including individualized plan of care and coordination with other care providers 24/7 contact phone numbers for assistance for urgent and routine care needs. Standard insurance, coinsurance, copays and deductibles apply for chronic care management only during months in which we provide at least 20 minutes of these services. Most insurances cover these services at 100%, however patients may be responsible for any copay, coinsurance and/or deductible if applicable. This service may help you avoid the need for more expensive face-to-face services. Only one practitioner may furnish and bill the service in a calendar month. The patient may stop CCM services at any time (effective at the end of the month) by phone call to the office staff.  Patient agreed to services and verbal consent obtained.   The patient verbalized understanding of instructions, educational materials, and care plan provided today and DECLINED offer to receive copy of patient instructions, educational materials, and care plan.  The pharmacy team will reach out to the patient again over the next 60 days.   Lane Hacker, [place]

## 2022-07-06 NOTE — Progress Notes (Signed)
Chronic Care Management Pharmacy Note  07/06/2022 Name:  Lucas Weiss MRN:  [id number] DOB:  Jan 21, 1956  Recommendation to PCP: Patient candidate for high intensity statin. Will ask PCP to start Patient non-compliant on Duloxetine. He stopped taking it 2 weeks ago. Will let PCP know Patient stated having issues with Metformin (Diarrhea). Will ask PCP to change to Metformin ER Sent direct msg to PCP regarding Farxiga PAP. PCP Ok'd it, will start process  Subjective: Lucas Weiss is an 66 y.o. year old male who is a primary patient of [name], DO.  The CCM team was consulted for assistance with disease management and care coordination needs.    Engaged with patient by telephone for follow up visit in response to provider referral for pharmacy case management and/or care coordination services.   Consent to Services:  The patient was given information about Chronic Care Management services, agreed to services, and gave verbal consent prior to initiation of services.  Please see initial visit note for detailed documentation.   Patient Care Team: [name], DO as PCP - General (Family Medicine) [name], MD as Consulting Physician (Orthopedic Surgery) Vanita Ingles, RN as Case Manager ([place]) Lane Hacker, [facility] (Pharmacist)  Hospital visits: None in previous 6 months  Objective:  Lab Results  Component Value Date   CREATININE 1.69 (H) 02/27/2022   CREATININE 1.25 01/30/2022   CREATININE 1.24 07/08/2021    Lab Results  Component Value Date   HGBA1C 8.6 (H) 05/01/2022   HGBA1C 7.2 (H) 01/30/2022   HGBA1C 7.3 (H) 07/08/2021   Last diabetic Eye exam: No results found for: "HMDIABEYEEXA"  Last diabetic Foot exam: No results found for: "HMDIABFOOTEX"      Component Value Date/Time   CHOL 230 (H) 01/30/2022 1500   CHOL 177 07/08/2021 1404   CHOL 179 10/08/2020 1505   TRIG 366 (H) 01/30/2022 1500   TRIG 309 (H) 07/08/2021 1404    TRIG 269 (H) 10/08/2020 1505   HDL 41 01/30/2022 1500   HDL 37 (L) 07/08/2021 1404   HDL 38 (L) 10/08/2020 1505   CHOLHDL 6.3 07/17/2011 0715   VLDL 48 (H) 07/17/2011 0715   LDLCALC 124 (H) 01/30/2022 1500   LDLCALC 89 07/08/2021 1404   LDLCALC 96 10/08/2020 1505       Latest Ref Rng & Units 01/30/2022    3:00 PM 07/08/2021    2:04 PM 09/04/2019    1:40 PM  Hepatic Function  Total Protein 6.0 - 8.5 g/dL 6.7  6.6  6.7   Albumin 3.8 - 4.8 g/dL 3.7  4.2  4.2   AST 0 - 40 IU/L 23  22  36   ALT 0 - 44 IU/L 26  26  40   Alk Phosphatase 44 - 121 IU/L 118  106  78   Total Bilirubin 0.0 - 1.2 mg/dL <0.2  0.2  0.3     Lab Results  Component Value Date/Time   TSH 0.792 01/30/2022 03:00 PM   TSH 1.440 10/08/2020 03:05 PM       Latest Ref Rng & Units 01/30/2022    3:00 PM 07/08/2021    2:04 PM 10/08/2020    3:05 PM  CBC  WBC 3.4 - 10.8 x10E3/uL 7.8  6.6  7.1   Hemoglobin 13.0 - 17.7 g/dL 12.6  11.8  11.7   Hematocrit 37.5 - 51.0 % 39.0  35.4  36.7   Platelets 150 - 450 x10E3/uL  198  198  327    No results found for: "VD25OH"  Clinical ASCVD:  The ASCVD Risk score (Arnett DK, et al., 2019) failed to calculate for the following reasons:   The patient has a prior MI or stroke diagnosis   Social History   Tobacco Use  Smoking Status Former   Packs/day: 1.50   Years: 30.00   Total pack years: 45.00   Types: Cigarettes   Quit date: 07/19/2008   Years since quitting: 13.9  Smokeless Tobacco Never   BP Readings from Last 3 Encounters:  06/02/22 126/68  05/01/22 112/62  02/27/22 122/70   Pulse Readings from Last 3 Encounters:  06/02/22 72  05/01/22 75  02/27/22 76   Wt Readings from Last 3 Encounters:  06/02/22 183 lb (83 kg)  05/01/22 182 lb 9.6 oz (82.8 kg)  02/27/22 186 lb (84.4 kg)   BMI Readings from Last 3 Encounters:  06/02/22 25.52 kg/m  05/01/22 25.47 kg/m  02/27/22 25.94 kg/m    Assessment: Review of patient past medical history, allergies,  medications, health status, including review of consultants reports, laboratory and other test data, was performed as part of comprehensive evaluation and provision of chronic care management services.   SDOH:  (Social Determinants of Health) assessments and interventions performed: Yes SDOH Interventions    Flowsheet Row Most Recent Value  SDOH Interventions   Financial Strain Interventions Other (Comment)  [Will ask PCP about PAP]  Transportation Interventions Intervention Not Indicated       CCM Care Plan  No Known Allergies  Medications Reviewed Today     Reviewed by Lane Hacker, [facility] (Pharmacist) on 07/06/22 at 1148  Med List Status: <None>   Medication Order Taking? Sig Documenting Provider Last Dose Status Informant  amLODipine (NORVASC) 5 MG tablet [id number] Yes Take 1 tablet (5 mg total) by mouth daily. Park Liter P, DO Taking Active   aspirin EC 81 MG tablet [id number] Yes Take 81 mg by mouth daily. [provider] Taking Active   Blood Glucose Monitoring Suppl (ONETOUCH VERIO) w/Device KIT [id number]  To check blood sugar twice a day and document, bring to provider visits. Marnee Guarneri T, NP  Active   DULoxetine 40 MG CPEP [id number]  Take 40 mg by mouth daily. Johnson, Megan P, DO  Active   glucose blood (ONETOUCH VERIO) test strip [id number]  To check blood sugar twice a day and document, bring to provider visits. Marnee Guarneri T, NP  Active   Lancets (ONETOUCH ULTRASOFT) lancets [id number]  To check blood sugar twice a day and document, bring to provider visits. Marnee Guarneri T, NP  Active   lisinopril (ZESTRIL) 40 MG tablet [id number] Yes Take 1 tablet (40 mg total) by mouth daily. Wynetta Emery, Megan P, DO Taking Active   lovastatin (MEVACOR) 40 MG tablet [id number]  Take 1 tablet (40 mg total) by mouth at bedtime. Johnson, Megan P, DO  Active   metFORMIN (GLUCOPHAGE) 500 MG tablet [id number] Yes TAKE 2 TABLETS BY MOUTH TWO TIMES A DAY WITH FOOD Johnson,  Megan P, DO Taking Active   metoprolol tartrate (LOPRESSOR) 50 MG tablet [id number] Yes Take 1 tablet (50 mg total) by mouth daily. [name], DO Taking Active             Patient Active Problem List   Diagnosis Date Noted   Multiple rib fractures 10/08/2020   Insomnia 05/06/2017   History of left hip replacement 05/19/2016   Controlled type 2 diabetes  mellitus ([place]) 01/09/2016   Tobacco abuse, in remission 01/09/2016   Severe recurrent major depression ([place]) 06/14/2015   Chronic pain 06/14/2015   Alcohol abuse, in remission    Atherosclerotic PVD with intermittent claudication ([place]) 10/25/2014   Hypertension associated with diabetes ([place]) 09/17/2014   Renal artery stenosis, native, bilateral ([place]) 02/20/2013   CAD (coronary artery disease) 09/16/2012   PAD (peripheral artery disease): Left common iliac stent plus additional stent(09/15/12) for ISRS. 09/16/2012   S/P CABG x 5: 2009: LIMA to LAD, SVG to Diag, Seq SVG to OM2-3, SVG to PDA 09/16/2012   Hypertensive heart failure ([place]) 09/16/2012   Hyperlipidemia associated with type 2 diabetes mellitus ([place]) 09/16/2012    Immunization History  Administered Date(s) Administered   Influenza Split 09/16/2012   Influenza,inj,Quad PF,6+ Mos 09/06/2015, 09/14/2016, 09/09/2017, 10/17/2018, 09/04/2019, 10/04/2020, 10/22/2021   Influenza-Unspecified 09/12/2014   Pneumococcal Polysaccharide-23 05/28/2010, 05/20/2016   Td 12/27/2020   Tdap 05/28/2010    Conditions to be addressed/monitored: CAD, PVD, bilateral renal artery stenosis, CHF, DMII, HTN, HLD, alcohol and tobacco abuse in remission  Care Plan : [place] plan  Updates made by Lane Hacker, RPH since 07/06/2022 12:00 AM     Problem: DM, HTN, HLD   Priority: High     Long-Range Goal: Disease management   Start Date: 09/09/2021  Expected End Date: 09/09/2022  Recent Progress: Not on track  Priority: High  Note:   Current Barriers:  Unable to  independently afford treatment regimen Unable to independently monitor therapeutic efficacy Unable to achieve control of DM   Pharmacist Clinical Goal(s):  Patient will verbalize ability to afford treatment regimen achieve adherence to monitoring guidelines and medication adherence to achieve therapeutic efficacy contact provider office for questions/concerns as evidenced notation of same in electronic health record through collaboration with PharmD and provider.   Interventions: 1:1 collaboration with [name], DO regarding development and update of comprehensive plan of care as evidenced by provider attestation and co-signature Inter-disciplinary care team collaboration (see longitudinal plan of care) Comprehensive medication review performed; medication list updated in electronic medical record  Hypertension (BP goal <140/90) BP Readings from Last 3 Encounters:  06/02/22 126/68  05/01/22 112/62  02/27/22 122/70  -Controlled -Current treatment: Metoprolol tartrate 50 mg twice daily Appropriate, Effective, Safe, Accessible Lisinopril 40 mg daily Appropriate, Effective, Safe, Accessible Amlodipine 5 mg twice daily Appropriate, Effective, Safe, Accessible -Current home readings: reports some >150/90 recently, has had arthritis pain and is in the process of moving out of trailer.  -Reviewed home BP monitoring techniques -Denies hypotensive/hypertensive symptoms -Educated on BP goals and benefits of medications for prevention of heart attack, stroke and kidney damage; Proper BP monitoring technique; -Counseled to monitor BP at home 2-3x/wk, document, and provide log at future appointments -Counseled on diet and exercise extensively Recommended to continue current medication  Hyperlipidemia: (LDL goal < 100) The ASCVD Risk score (Arnett DK, et al., 2019) failed to calculate for the following reasons:   The patient has a prior MI or stroke diagnosis Lab Results  Component  Value Date   CHOL 230 (H) 01/30/2022   CHOL 177 07/08/2021   CHOL 179 10/08/2020   Lab Results  Component Value Date   HDL 41 01/30/2022   HDL 37 (L) 07/08/2021   HDL 38 (L) 10/08/2020   Lab Results  Component Value Date   LDLCALC 124 (H) 01/30/2022   LDLCALC 89 07/08/2021   LDLCALC 96 10/08/2020   Lab Results  Component  Value Date   TRIG 366 (H) 01/30/2022   TRIG 309 (H) 07/08/2021   TRIG 269 (H) 10/08/2020   Lab Results  Component Value Date   CHOLHDL 6.3 07/17/2011   CHOLHDL 6.7 07/19/2008  No results found for: "LDLDIRECT" -Not ideally controlled -Secondary prevention  -Current treatment: Lovastatin 40 mg once daily Query Appropriate -Reviewed tolerability/side effects - no problems noted. -On chart review hx or atorvastatin and rosuvastatin noted. No recall of side effects.  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 -Educated on A1c and blood sugar goals; Benefits of routine self-monitoring of blood sugar; July 2023: Patient states he doesn't check sugars. Metformin upsets his stomach. Will ask PCP about changing to ER and about Farxiga PAP   F/U: CPP F/U Sept 2023  [place] PFloridaDKeturah [phone]      Future Appointments  Date Time Provider DBouse 08/03/2022  3:40 PM JValerie Roys DO [facility]  09/07/2022  3:00 PM CFP CCM  PHARMACY [facility]   CPP F/U Sept 2023  [place] Constable PFloridaD. -- [phone]

## 2022-07-10 ENCOUNTER — Telehealth: Payer: Self-pay

## 2022-07-10 NOTE — Chronic Care Management (AMB) (Signed)
    Chronic Care Management Pharmacy Assistant   Name: RAKWON LETOURNEAU  MRN: [id number] DOB: 1956/02/25   Reason for Encounter: Patient assitance application on Farxiga 10 mg    Medications: Outpatient Encounter Medications as of 07/10/2022  Medication Sig   amLODipine (NORVASC) 5 MG tablet Take 1 tablet (5 mg total) by mouth daily.   aspirin EC 81 MG tablet Take 81 mg by mouth daily.   Blood Glucose Monitoring Suppl (ONETOUCH VERIO) w/Device KIT To check blood sugar twice a day and document, bring to provider visits.   DULoxetine 40 MG CPEP Take 40 mg by mouth daily.   glucose blood (ONETOUCH VERIO) test strip To check blood sugar twice a day and document, bring to provider visits.   Lancets (ONETOUCH ULTRASOFT) lancets To check blood sugar twice a day and document, bring to provider visits.   lisinopril (ZESTRIL) 40 MG tablet Take 1 tablet (40 mg total) by mouth daily.   lovastatin (MEVACOR) 40 MG tablet Take 1 tablet (40 mg total) by mouth at bedtime.   metFORMIN (GLUCOPHAGE) 500 MG tablet TAKE 2 TABLETS BY MOUTH TWO TIMES A DAY WITH FOOD   metoprolol tartrate (LOPRESSOR) 50 MG tablet Take 1 tablet (50 mg total) by mouth daily.   No facility-administered encounter medications on file as of 07/10/2022.    I have prefilled and mailed out patent assistance application to the patient. I have mentioned he will need to complete the highlighted areas of the form sign and date and bring back to the office. Patient understands and will return the form back when he completes it.   Corrie Mckusick, [place]

## 2022-07-15 ENCOUNTER — Telehealth: Payer: Self-pay | Admitting: Family Medicine

## 2022-07-15 NOTE — Telephone Encounter (Signed)
Copied from CRM [id number]. Topic: Medicare AWV >> Jul 15, 2022 11:55 AM Granville Lewis wrote: Reason for CRM:  N/A unable to leave a message for patient to call back and schedule the Medicare Annual Wellness Visit (AWV) virtually or by telephone.  Last AWV 08/30/20  Please schedule at anytime with CFP-Nurse Health Advisor.   Any questions, please call me at [phone]

## 2022-07-20 DIAGNOSIS — I110 Hypertensive heart disease with heart failure: Secondary | ICD-10-CM | POA: Diagnosis not present

## 2022-07-20 DIAGNOSIS — E1165 Type 2 diabetes mellitus with hyperglycemia: Secondary | ICD-10-CM

## 2022-08-03 ENCOUNTER — Ambulatory Visit (INDEPENDENT_AMBULATORY_CARE_PROVIDER_SITE_OTHER): Payer: Medicare HMO | Admitting: Family Medicine

## 2022-08-03 ENCOUNTER — Encounter: Payer: Self-pay | Admitting: Family Medicine

## 2022-08-03 VITALS — BP 106/61 | HR 75 | Temp 98.1°F | Wt 178.4 lb

## 2022-08-03 DIAGNOSIS — E1159 Type 2 diabetes mellitus with other circulatory complications: Secondary | ICD-10-CM | POA: Diagnosis not present

## 2022-08-03 DIAGNOSIS — G894 Chronic pain syndrome: Secondary | ICD-10-CM

## 2022-08-03 DIAGNOSIS — E1165 Type 2 diabetes mellitus with hyperglycemia: Secondary | ICD-10-CM

## 2022-08-03 DIAGNOSIS — F332 Major depressive disorder, recurrent severe without psychotic features: Secondary | ICD-10-CM | POA: Diagnosis not present

## 2022-08-03 DIAGNOSIS — I152 Hypertension secondary to endocrine disorders: Secondary | ICD-10-CM

## 2022-08-03 DIAGNOSIS — R69 Illness, unspecified: Secondary | ICD-10-CM | POA: Diagnosis not present

## 2022-08-03 LAB — BAYER DCA HB A1C WAIVED: HB A1C (BAYER DCA - WAIVED): 9.6 % — ABNORMAL HIGH (ref 4.8–5.6)

## 2022-08-03 MED ORDER — DULOXETINE HCL 20 MG PO CPEP: 20.0000 mg | ORAL_CAPSULE | Freq: Every day | ORAL | 3 refills | Status: DC

## 2022-08-03 MED ORDER — AMLODIPINE BESYLATE 2.5 MG PO TABS: 2.5000 mg | ORAL_TABLET | Freq: Every day | ORAL | 3 refills | Status: DC

## 2022-08-03 MED ORDER — METFORMIN HCL ER 500 MG PO TB24: 500.0000 mg | ORAL_TABLET | Freq: Every day | ORAL | 1 refills | Status: DC

## 2022-08-03 MED ORDER — GLIPIZIDE 5 MG PO TABS: 5.0000 mg | ORAL_TABLET | Freq: Two times a day (BID) | ORAL | 1 refills | Status: DC

## 2022-08-03 NOTE — Assessment & Plan Note (Signed)
Stopped cymbalta due to feeling weird on the 40mg . Will restart 20mg  and recheck tolerance in 1 month.

## 2022-08-03 NOTE — Progress Notes (Signed)
BP 106/61   Pulse 75   Temp 98.1 F (36.7 C)   Wt 178 lb 6.4 oz (80.9 kg)   SpO2 98%   BMI 24.88 kg/m    Subjective:    Patient ID: Lucas Weiss, male    DOB: [birth year], 66 y.o.   MRN: [id number]  HPI: Lucas Weiss is a 66 y.o. male  Chief Complaint  Patient presents with   Diabetes   Depression    Patient states he stopped taking cymbalta because he was experiencing bad side effects.    Back Pain   DIABETES Hypoglycemic episodes:no Polydipsia/polyuria: no Visual disturbance: no Chest pain: no Paresthesias: no Glucose Monitoring: no  Accucheck frequency: Not Checking Taking Insulin?: no Blood Pressure Monitoring: not checking Retinal Examination: Not up to Date Foot Exam: Up to Date Diabetic Education: Not Completed Pneumovax: Up to Date Influenza: Up to Date Aspirin: no  HYPERTENSION Hypertension status:  overtreated   Satisfied with current treatment? no Duration of hypertension: chronic BP monitoring frequency:  not checking BP medication side effects:  yes- dizziness and shakiness Medication compliance: excellent compliance Previous BP meds:metoprolol, amlodipine, lisinopril Aspirin: no Recurrent headaches: no Visual changes: no Palpitations: no Dyspnea: no Chest pain: no Lower extremity edema: no Dizzy/lightheaded: yes  DEPRESSION Mood status: worse Satisfied with current treatment?: no Symptom severity: mild  Duration of current treatment :  stopped his medicine Side effects: felt "weird" on the 40mg  so stopped it Medication compliance: poor compliance Psychotherapy/counseling: no  Previous psychiatric medications: cymbalta Depressed mood: yes Anxious mood: yes Anhedonia: no Significant weight loss or gain: no Insomnia: no  Fatigue: yes Feelings of worthlessness or guilt: yes Impaired concentration/indecisiveness: no Suicidal ideations: no Hopelessness: no Crying spells: no    05/01/2022    3:08 PM 01/30/2022    3:20 PM 08/27/2021    12:19 PM 01/09/2020    1:24 PM 07/31/2019    1:02 PM  Depression screen PHQ 2/9  Decreased Interest  0 0 0 0  Down, Depressed, Hopeless  0 0 0 0  PHQ - 2 Score  0 0 0 0  Difficult doing work/chores Not difficult at all         Relevant past medical, surgical, family and social history reviewed and updated as indicated. Interim medical history since our last visit reviewed. Allergies and medications reviewed and updated.  Review of Systems  Constitutional:  Positive for fatigue. Negative for activity change, appetite change, chills, diaphoresis, fever and unexpected weight change.  HENT: Negative.    Respiratory: Negative.    Cardiovascular: Negative.   Gastrointestinal: Negative.   Musculoskeletal:  Positive for back pain, gait problem and myalgias. Negative for arthralgias, joint swelling, neck pain and neck stiffness.  Neurological:  Positive for dizziness, weakness and light-headedness. Negative for tremors, seizures, syncope, facial asymmetry, speech difficulty, numbness and headaches.  Psychiatric/Behavioral:  Positive for dysphoric mood. Negative for agitation, behavioral problems, confusion, decreased concentration, hallucinations, self-injury, sleep disturbance and suicidal ideas. The patient is not nervous/anxious and is not hyperactive.     Per HPI unless specifically indicated above     Objective:    BP 106/61   Pulse 75   Temp 98.1 F (36.7 C)   Wt 178 lb 6.4 oz (80.9 kg)   SpO2 98%   BMI 24.88 kg/m   Wt Readings from Last 3 Encounters:  08/03/22 178 lb 6.4 oz (80.9 kg)  06/02/22 183 lb (83 kg)  05/01/22 182 lb 9.6 oz (82.8 kg)  Physical Exam Vitals and nursing note reviewed.  Constitutional:      General: He is not in acute distress.    Appearance: Normal appearance. He is not ill-appearing, toxic-appearing or diaphoretic.  HENT:     Head: Normocephalic and atraumatic.     Right Ear: External ear normal.     Left Ear: External ear normal.     Nose:  Nose normal.     Mouth/Throat:     Mouth: Mucous membranes are moist.     Pharynx: Oropharynx is clear.  Eyes:     General: No scleral icterus.       Right eye: No discharge.        Left eye: No discharge.     Extraocular Movements: Extraocular movements intact.     Conjunctiva/sclera: Conjunctivae normal.     Pupils: Pupils are equal, round, and reactive to light.  Cardiovascular:     Rate and Rhythm: Normal rate and regular rhythm.     Pulses: Normal pulses.     Heart sounds: Normal heart sounds. No murmur heard.    No friction rub. No gallop.  Pulmonary:     Effort: Pulmonary effort is normal. No respiratory distress.     Breath sounds: Normal breath sounds. No stridor. No wheezing, rhonchi or rales.  Chest:     Chest wall: No tenderness.  Musculoskeletal:        General: Normal range of motion.     Cervical back: Normal range of motion and neck supple.  Skin:    General: Skin is warm and dry.     Capillary Refill: Capillary refill takes less than 2 seconds.     Coloration: Skin is not jaundiced or pale.     Findings: No bruising, erythema, lesion or rash.  Neurological:     General: No focal deficit present.     Mental Status: He is alert and oriented to person, place, and time. Mental status is at baseline.  Psychiatric:        Mood and Affect: Mood normal.        Behavior: Behavior normal.        Thought Content: Thought content normal.        Judgment: Judgment normal.     Results for orders placed or performed in visit on 05/01/22  Bayer DCA Hb A1c Waived  Result Value Ref Range   HB A1C (BAYER DCA - WAIVED) 8.6 (H) 4.8 - 5.6 %      Assessment & Plan:   Problem List Items Addressed This Visit       Cardiovascular and Mediastinum   Hypertension associated with diabetes (HCC)    BP running too low and he's getting shakey. Will cut his amlodipine to 2.5mg  and recheck in 1 month. Call with any concerns. Continue to monitor.       Relevant Medications    amLODipine (NORVASC) 2.5 MG tablet   glipiZIDE (GLUCOTROL) 5 MG tablet   metFORMIN (GLUCOPHAGE-XR) 500 MG 24 hr tablet     Endocrine   Controlled type 2 diabetes mellitus (HCC) - Primary    Not doing well with A1c up to 9.6. Will change metformin to ER and add glipizide. Recheck tolerance in 1 month. Will get CCM involved to help with mediation coverage.       Relevant Medications   glipiZIDE (GLUCOTROL) 5 MG tablet   metFORMIN (GLUCOPHAGE-XR) 500 MG 24 hr tablet   Other Relevant Orders   Bayer DCA Hb A1c Waived  AMB Referral to [facility]     Other   Severe recurrent major depression (HCC)    Refuses PHQ9. Stopped cymbalta due to feeling weird on the 40mg . Will restart 20mg  and recheck tolerance in 1 month.       Relevant Medications   DULoxetine (CYMBALTA) 20 MG capsule   Chronic pain    Stopped cymbalta due to feeling weird on the 40mg . Will restart 20mg  and recheck tolerance in 1 month.       Relevant Medications   DULoxetine (CYMBALTA) 20 MG capsule     Follow up plan: Return in about 4 weeks (around 08/31/2022).

## 2022-08-03 NOTE — Assessment & Plan Note (Signed)
Not doing well with A1c up to 9.6. Will change metformin to ER and add glipizide. Recheck tolerance in 1 month. Will get CCM involved to help with mediation coverage.

## 2022-08-03 NOTE — Assessment & Plan Note (Signed)
BP running too low and he's getting shakey. Will cut his amlodipine to 2.5mg  and recheck in 1 month. Call with any concerns. Continue to monitor.

## 2022-08-03 NOTE — Assessment & Plan Note (Signed)
Refuses PHQ9. Stopped cymbalta due to feeling weird on the 40mg . Will restart 20mg  and recheck tolerance in 1 month.

## 2022-09-01 ENCOUNTER — Telehealth: Payer: Self-pay | Admitting: Family Medicine

## 2022-09-01 NOTE — Telephone Encounter (Signed)
Copied from CRM [id number]. Topic: Medicare AWV >> Sep 01, 2022  1:12 PM Zannie Kehr wrote: Reason for CRM:  Left message for patient to call back and schedule Medicare Annual Wellness Visit (AWV). Please offer to do virtually or by telephone.  Last AWV: 08/30/2021  Please schedule at any time with CFP-Nurse Health Advisor.  30 minute appointment for Virtual or phone 45 minute appointment for Initial virtual/phone  Any questions, please contact me at [phone]

## 2022-09-02 ENCOUNTER — Ambulatory Visit (INDEPENDENT_AMBULATORY_CARE_PROVIDER_SITE_OTHER): Payer: Medicare HMO

## 2022-09-02 VITALS — Ht 69.0 in | Wt 185.0 lb

## 2022-09-02 DIAGNOSIS — Z0000 Encounter for general adult medical examination without abnormal findings: Secondary | ICD-10-CM

## 2022-09-02 NOTE — Progress Notes (Signed)
Subjective:   Lucas Weiss is a 66 y.o. male who presents for Medicare Annual/Subsequent preventive examination.  I connected with  Lucas Weiss on 09/02/22 by a audio enabled telemedicine application and verified that I am speaking with the correct person using two identifiers.  Patient Location: Home  Provider Location: Office/Clinic  I discussed the limitations of evaluation and management by telemedicine. The patient expressed understanding and agreed to proceed.   Review of Systems    Defer to PCP.       Objective:    Today's Vitals   09/02/22 1258 09/02/22 1304  Weight: 185 lb (83.9 kg)   Height: '5\' 9"'  (1.753 m)   PainSc:  5    Body mass index is 27.32 kg/m.     08/30/2020    3:29 PM 07/31/2019    1:13 PM 07/25/2018    1:10 PM 07/21/2017    3:31 PM 05/12/2016   11:32 AM 11/11/2015    4:33 PM 09/15/2012    6:40 PM  Advanced Directives  Does Patient Have a Medical Advance Directive? No No No No No No Patient does not have advance directive;Patient would like information  Would patient like information on creating a medical advance directive?   Yes (MAU/Ambulatory/Procedural Areas - Information given) Yes (MAU/Ambulatory/Procedural Areas - Information given) Yes - Educational materials given  Referral made to social work    Current Medications (verified) Outpatient Encounter Medications as of 09/02/2022  Medication Sig   amLODipine (NORVASC) 2.5 MG tablet Take 1 tablet (2.5 mg total) by mouth daily.   aspirin EC 81 MG tablet Take 81 mg by mouth daily.   Blood Glucose Monitoring Suppl (ONETOUCH VERIO) w/Device KIT To check blood sugar twice a day and document, bring to provider visits.   glipiZIDE (GLUCOTROL) 5 MG tablet Take 1 tablet (5 mg total) by mouth 2 (two) times daily before a meal.   glucose blood (ONETOUCH VERIO) test strip To check blood sugar twice a day and document, bring to provider visits.   Lancets (ONETOUCH ULTRASOFT) lancets To check blood sugar  twice a day and document, bring to provider visits.   lisinopril (ZESTRIL) 40 MG tablet Take 1 tablet (40 mg total) by mouth daily.   lovastatin (MEVACOR) 40 MG tablet Take 1 tablet (40 mg total) by mouth at bedtime.   metFORMIN (GLUCOPHAGE-XR) 500 MG 24 hr tablet Take 1 tablet (500 mg total) by mouth daily with breakfast.   metoprolol tartrate (LOPRESSOR) 50 MG tablet Take 1 tablet (50 mg total) by mouth daily.   DULoxetine (CYMBALTA) 20 MG capsule Take 1 capsule (20 mg total) by mouth daily. (Patient not taking: Reported on 09/02/2022)   No facility-administered encounter medications on file as of 09/02/2022.    Allergies (verified) Patient has no known allergies.   History: Past Medical History:  Diagnosis Date   Alcohol abuse, in remission    Avascular necrosis of bone of left hip ([place]) 10/15   Total hip replacement recommended by ortho, vascular says it's OK   Avascular necrosis of bone of left hip (HCC)    Total hip replacement recommended by ortho, vascular says he can have it done.     Bleeding hemorrhoids    Broken ribs 09/27/2020   CHF (congestive heart failure) (HCC)    Chronic low back pain    Complication of anesthesia    "woke up twice during procedures @ [facility]" (09/15/2012)   Coronary artery disease    Diabetes mellitus without  complication ([place]) 8/34/19   A1c 6.8   Diverticulosis of sigmoid colon    DVT (deep venous thrombosis) (HCC)    Dyslexia    H/O hiatal hernia    Head injury, acute, with loss of consciousness ([place])    as a child   High cholesterol    Hypertension    Impaired fasting glucose    Last A1c 6.1   Myocardial infarction [facility])    Neuropathy    PAD (peripheral artery disease) ([place])    07/2012:  right ABI 0.64, left ABI 0.57.   Pneumonia    history of    Primary osteoarthritis of left hip    Renal artery stenosis (HCC)    Bilateral    Severe recurrent major depression without psychotic features ([place])    Shortness of breath dyspnea    with  exertion - due to pain   Stroke [facility]) 2009   "after CABG; ~ blind left eye since" (09/15/2012)- memory loss    Tobacco abuse    Quit in 2009   Past Surgical History:  Procedure Laterality Date   2D Echo  02/17/11   EF 50-55%, mild LVH, No WMA, mild MR, mild TR   CARDIAC CATHETERIZATION  ?2009   CARDIOVASCULAR STRESS TEST  08/10/12   Lexiscan: EF 70%, Attenuation artifact in inferior region.  No ischemia or infarct in remaining myocardium. Low risk   COLONOSCOPY W/ POLYPECTOMY     "no cancer"   CORONARY ARTERY BYPASS GRAFT  2009   2009, CABG X5, LIMA to LAD, SVG to Diagnonal, Sequential SVG to OM 2-3, SVG to PDA; Dr. Cyndia Bent   INSERTION OF ILIAC STENT Left 09/15/2012   Procedure: INSERTION OF ILIAC STENT;  Surgeon: Lorretta Harp, MD;  Location[facility] CATH LAB;  Service: Cardiovascular;  Laterality: Left;  lt iliac stent   LOWER EXTREMITY ANGIOGRAM N/A 09/15/2012   Procedure: LOWER EXTREMITY ANGIOGRAM;  Surgeon: Lorretta Harp, MD;  Location: [facility] CATH LAB;  Service: Cardiovascular;  Laterality: N/A;   PERIPHERAL ARTERIAL STENT GRAFT Left 06/20/2012; ~ 07/2012; 09/15/2012   left; left; left   PV angiogram  09/15/12   restenting of left common iliac for ISRS.  Bilateral occluded SFAs.  Renal artery stenosis: 70% right inferior renal artery, 90% left inferior renal artery stenosis.    TOTAL HIP ARTHROPLASTY Left 05/19/2016   Procedure: LEFT TOTAL HIP ARTHROPLASTY ANTERIOR APPROACH;  Surgeon: [name], MD;  Location: [facility];  Service: Orthopedics;  Laterality: Left;   Family History  Problem Relation Age of Onset   Diabetes Mother    Heart disease Mother    Hyperlipidemia Mother    Hypertension Mother    Heart attack Mother    Thyroid disease Mother    Hyperlipidemia Father    Hypertension Father    Heart attack Father    Heart disease Father        before age 19   Alcohol abuse Father    Lung disease Father    Diabetes Sister    Hyperlipidemia Sister    Hypertension Sister     Cancer Sister    Heart disease Sister    Hypertension Sister    Hyperlipidemia Sister    Heart disease Brother        before age 51   Hyperlipidemia Brother    Hypertension Brother    Heart attack Brother    Diabetes Brother    Heart disease Brother    Hyperlipidemia Brother  Hypertension Brother    Stroke Brother    Heart attack Brother    Social History   Socioeconomic History   Marital status: Divorced    Spouse name: Not on file   Number of children: Not on file   Years of education: Not on file   Highest education level: 11th grade  Occupational History   Occupation: disability  Tobacco Use   Smoking status: Former    Packs/day: 1.50    Years: 30.00    Total pack years: 45.00    Types: Cigarettes    Quit date: 07/19/2008    Years since quitting: 14.1   Smokeless tobacco: Never  Vaping Use   Vaping Use: Never used  Substance and Sexual Activity   Alcohol use: No    Comment: Former Alcoholic, not drinking anymore. 09/15/2012 "3-4 beers; shot of crown @ least 2 nights/wk"   Drug use: No   Sexual activity: Not Currently  Other Topics Concern   Not on file  Social History Narrative   Not on file   Social Determinants of Health   Financial Resource Strain: Low Risk  (09/02/2022)   Overall Financial Resource Strain (CARDIA)    Difficulty of Paying Living Expenses: Not hard at all  Recent Concern: Financial Resource Strain - High Risk (07/06/2022)   Overall Financial Resource Strain (CARDIA)    Difficulty of Paying Living Expenses: Hard  Food Insecurity: No Food Insecurity (09/02/2022)   Hunger Vital Sign    Worried About Running Out of Food in the Last Year: Never true    Ran Out of Food in the Last Year: Never true  Transportation Needs: No Transportation Needs (09/02/2022)   PRAPARE - Hydrologist (Medical): No    Lack of Transportation (Non-Medical): No  Physical Activity: Inactive (09/02/2022)   Exercise Vital Sign    Days  of Exercise per Week: 0 days    Minutes of Exercise per Session: 0 min  Stress: No Stress Concern Present (09/02/2022)   [place]    Feeling of Stress : Not at all  Social Connections: Moderately Isolated (09/02/2022)   Social Connection and Isolation Panel [NHANES]    Frequency of Communication with Friends and Family: Three times a week    Frequency of Social Gatherings with Friends and Family: Three times a week    Attends Religious Services: More than 4 times per year    Active Member of Clubs or Organizations: No    Attends Archivist Meetings: Never    Marital Status: Divorced    Tobacco Counseling Counseling given: Not Answered   Clinical Intake:  Pre-visit preparation completed: Yes  Pain : 0-10 Pain Score: 5  Pain Type: Chronic pain Pain Location: Back     BMI - recorded: 27.32 Nutritional Status: BMI 25 -29 Overweight Nutritional Risks: None Diabetes: Yes CBG done?: No Did pt. bring in CBG monitor from home?: No  How often do you need to have someone help you when you read instructions, pamphlets, or other written materials from your doctor or pharmacy?: 1 - Never What is the last grade level you completed in school?: 11th grade  Diabetic- Yes  Interpreter Needed?: No      Activities of Daily Living    09/02/2022    1:18 PM  In your present state of health, do you have any difficulty performing the following activities:  Hearing? 1  Vision? 0  Difficulty  concentrating or making decisions? 0  Walking or climbing stairs? 1  Dressing or bathing? 0  Doing errands, shopping? 0    Patient Care Team: [name], DO as PCP - General (Family Medicine) [name], MD as Consulting Physician (Orthopedic Surgery) Vanita Ingles, RN as Case Manager ([place]) Lane Hacker, [facility] (Pharmacist)  Indicate any recent Medical Services you may have received  from other than [facility] providers in the past year (date may be approximate).     Assessment:   This is a routine wellness examination for Husayn.  Hearing/Vision screen No results found.  Dietary issues and exercise activities discussed:     Goals Addressed   None   Depression Screen    09/02/2022    1:15 PM 06/02/2022    3:42 PM 05/01/2022    3:08 PM 02/27/2022    2:50 PM 01/30/2022    3:20 PM 01/30/2022    2:54 PM 08/27/2021   12:19 PM  PHQ 2/9 Scores  PHQ - 2 Score 0    0  0  PHQ- 9 Score 4        Exception Documentation  Patient refusal Patient refusal Patient refusal Patient refusal Patient refusal     Fall Risk    09/02/2022    1:17 PM 10/22/2020   10:34 AM 08/30/2020    3:30 PM 07/31/2019    1:02 PM 02/14/2019    1:57 PM  [place] in the past year? 1 1 0 0 0  Number falls in past yr: 0 0     Injury with Fall? 0 1     Risk for fall due to : History of fall(s)  Medication side effect    Follow up   Falls evaluation completed;Education provided;Falls prevention discussed  Falls evaluation completed    FALL RISK PREVENTION PERTAINING TO THE HOME:  Any stairs in or around the home? Yes  If so, are there any without handrails? No  Home free of loose throw rugs in walkways, pet beds, electrical cords, etc? Yes Adequate lighting in your home to reduce risk of falls? Yes   ASSISTIVE DEVICES UTILIZED TO PREVENT FALLS:  Life alert? Yes  Use of a cane, walker or w/c? Yes  Grab bars in the bathroom? No  Shower chair or bench in shower? No  Elevated toilet seat or a handicapped toilet? No   TIMED UP AND GO:  Was the test performed? No .    Cognitive Function:        09/02/2022    1:19 PM 07/25/2018    1:13 PM 07/21/2017    3:34 PM  6CIT Screen  What Year? 0 points 0 points 0 points  What month? 0 points 0 points 0 points  What time? 0 points 0 points 0 points  Count back from 20 0 points 0 points 0 points  Months in reverse 4 points 0 points 0 points   Repeat phrase 10 points 6 points 2 points  Total Score 14 points 6 points 2 points    Immunizations Immunization History  Administered Date(s) Administered   Influenza Split 09/16/2012   Influenza,inj,Quad PF,6+ Mos 09/06/2015, 09/14/2016, 09/09/2017, 10/17/2018, 09/04/2019, 10/04/2020, 10/22/2021   Influenza-Unspecified 09/12/2014   Pneumococcal Polysaccharide-23 05/28/2010, 05/20/2016   Td 12/27/2020   Tdap 05/28/2010    TDAP status: Up to date  Flu Vaccine status: Due, Education has been provided regarding the importance of this vaccine. Advised may receive this vaccine at  local pharmacy or Health Dept. Aware to provide a copy of the vaccination record if obtained from local pharmacy or Health Dept. Verbalized acceptance and understanding.  Pneumococcal vaccine status: Due, Education has been provided regarding the importance of this vaccine. Advised may receive this vaccine at local pharmacy or Health Dept. Aware to provide a copy of the vaccination record if obtained from local pharmacy or Health Dept. Verbalized acceptance and understanding.  Covid-19 vaccine status: Declined, Education has been provided regarding the importance of this vaccine but patient still declined. Advised may receive this vaccine at local pharmacy or Health Dept.or vaccine clinic. Aware to provide a copy of the vaccination record if obtained from local pharmacy or Health Dept. Verbalized acceptance and understanding.  Qualifies for Shingles Vaccine? Yes   Zostavax completed No   Shingrix Completed?: No.    Education has been provided regarding the importance of this vaccine. Patient has been advised to call insurance company to determine out of pocket expense if they have not yet received this vaccine. Advised may also receive vaccine at local pharmacy or Health Dept. Verbalized acceptance and understanding.  Screening Tests Health Maintenance  Topic Date Due   COVID-19 Vaccine (1) Never done    OPHTHALMOLOGY EXAM  Never done   Fecal DNA (Cologuard)  Never done   Zoster Vaccines- Shingrix (1 of 2) Never done   INFLUENZA VACCINE  07/21/2022   Diabetic kidney evaluation - Urine ACR  01/30/2023   FOOT EXAM  01/30/2023   HEMOGLOBIN A1C  02/03/2023   Diabetic kidney evaluation - GFR measurement  02/28/2023   TETANUS/TDAP  12/27/2030   Hepatitis C Screening  Completed   HIV Screening  Completed   HPV VACCINES  Aged Out    Health Maintenance  Health Maintenance Due  Topic Date Due   COVID-19 Vaccine (1) Never done   OPHTHALMOLOGY EXAM  Never done   Fecal DNA (Cologuard)  Never done   Zoster Vaccines- Shingrix (1 of 2) Never done   INFLUENZA VACCINE  07/21/2022    Colon Cancer Screening- declined  Lung Cancer Screening: (Low Dose CT Chest recommended if Age 3-80 years, 30 pack-year currently smoking OR have quit w/in 15years.) does not qualify.   Lung Cancer Screening Referral: N/A  Additional Screening:  Hepatitis C Screening: does qualify; Completed 10/08/2015  Vision Screening: Recommended annual ophthalmology exams for early detection of glaucoma and other disorders of the eye. Is the patient up to date with their annual eye exam?  No  Who is the provider or what is the name of the office in which the patient attends annual eye exams? [facility] If pt is not established with a provider, would they like to be referred to a provider to establish care? No .   Dental Screening: Recommended annual dental exams for proper oral hygiene  Community Resource Referral / Chronic Care Management: CRR required this visit?  No   CCM required this visit?  No      Plan:     I have personally reviewed and noted the following in the patient's chart:   Medical and social history Use of alcohol, tobacco or illicit drugs  Current medications and supplements including opioid prescriptions. Patient is not currently taking opioid prescriptions. Functional  ability and status Nutritional status Physical activity Advanced directives List of other physicians Hospitalizations, surgeries, and ER visits in previous 12 months Vitals Screenings to include cognitive, depression, and falls Referrals and appointments  In addition, I  have reviewed and discussed with patient certain preventive protocols, quality metrics, and best practice recommendations. A written personalized care plan for preventive services as well as general preventive health recommendations were provided to patient.     Georgina Peer, [place]   09/02/2022   Nurse Notes: Non Face to Face- 30 minutes    Mr. Mahrt , Thank you for taking time to come for your Medicare Wellness Visit. I appreciate your ongoing commitment to your health goals. Please review the following plan we discussed and let me know if I can assist you in the future.   These are the goals we discussed:  Goals       CCM:  Monitor and Maintain HbA1c <8%      Needs help with coverage on medication. Goal of getting A1c less than 8      Patient Stated      08/30/2020, move out of [facility]      PharmD "I want to take care of myself" (pt-stated)      Current Barriers:  Diabetes: uncontrolled; complicated by chronic medical conditions including CAD (s/p CABG 2009, reports CVA), PAD (s/p iliac stenting), renal artery stenosis, HTN, depression, most recent A1c 7.4% Notes that he has a difficult time financially, as well as with some cognitive deficits s/p CVA. Many of these things coincided w/ divorce of his wife and losing property Does NOT have prescription drug insurance. Notes Vania Rea is a few hundred dollars Current antihyperglycemic regimen: metformin 1000 mg BID, Jardiance 25 mg daily  Cardiovascular risk reduction: Current hypertensive regimen: lisinopril 20 mg daily, metoprolol tartrate 50 mg daily  Current hyperlipidemia regimen: lovastatin 40 mg daily, LDL not at goal <70 Current antiplatelet  regimen: ASA 81 mg  Depression/insomnia:  Quetiapine 25 mg QPM  Pharmacist Clinical Goal(s):  Over the next 90 days, patient will work with PharmD and primary care provider to address optimizing medication management  Interventions: Comprehensive medication review performed, medication list updated in electronic medical record Discussed financial concerns. Placed Care Guide referral.  Discussed Medicare Extra Help. Patient would qualify due to his income, but notes that he has some rental property that would put him over the asset limit. Discussed application for Jardiance assistance for BI. Patient will qualify; we may have to show them proof of copay for Jardiance eventually to show that he is not eligible for Medicare Extra Help. Will collaborate w/ Susy Frizzle, CPhT to mail patient his portion of application Metoprolol tartrate should be dosed twice daily. Will f/u w/ PCP about this prior to next appointment. Per chart review, patient had previously been on atorvastatin and rosuvastatin. Unclear of why these were discontinued. Will evaluate moving forward, as patient should be on high intensity statin for secondary prevention  Patient Self Care Activities:  Patient will check blood glucose daily, document, and provide at future appointments Patient will take medications as prescribed Patient will report any questions or concerns to provider   Initial goal documentation       RNCM: Monitor and Manage My Blood Sugar-Diabetes Type 2      Timeframe:  Long-Range Goal Priority:  High Start Date:     01-22-2021                        Expected End Date:    9-7--2023                 Follow Up Date 10-21-2021 05/21/2021 patient now has a  glucose meter   - check blood sugar at prescribed times - check blood sugar before and after exercise - check blood sugar if I feel it is too high or too low    Why is this important?   Checking your blood sugar at home helps to keep it from getting very  high or very low.  Writing the results in a diary or log helps the doctor know how to care for you.  Your blood sugar log should have the time, date and the results.  Also, write down the amount of insulin or other medicine that you take.  Other information, like what you ate, exercise done and how you were feeling, will also be helpful.     Notes: The patient is not checking his blood sugars. The patient does not have a meter. States he can not afford 130.00. Pharmacy consult to help with cost constraints. 05/21/2021: Patient received a glucose meter and is now checking his blood sugars once a week (usually in the evenings) with range in the lower 300 (220-254).  Patient checked blood glucose during contact with result 184.  Patient not taking Jardiance due to inability to purchase because of how expensive medication is. Patient asking for different medication for diabetes that he can afford. 08-27-2021: The patient is still not taking Jardiance because he cannot afford it. 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 The patient states that his blood sugars have been 300 and higher. Last hemoglobin A1C was 7.3 in July. The patient did not understand what this was. Education and support given. Will continue to monitor and collaborate with pcp.       Set My Target A1C-Diabetes Type 2      Timeframe:  Short-Term Goal Priority:  High Start Date:                             Expected End Date:                       Follow Up Date 2 month follow up - set target A1C   -obtain glucometer (part B card to pharmacy0 -Complete jardiance PAP Why is this important?   Your target A1C is decided together by you and your doctor.  It is based on several things like your age and other health issues.    Notes:       Track and Manage My Blood Pressure-Hypertension      Timeframe:  Long-Range Goal Priority:  Medium Start Date:                             Expected End Date:                        Follow Up Date 2 month follow up     - check blood pressure 3 times per week - write blood pressure results in a log or diary    Why is this important?   You won't feel high blood pressure, but it can still hurt your blood vessels.  High blood pressure can cause heart or kidney problems. It can also cause a stroke.  Making lifestyle changes like losing a little weight or eating less salt will help.  Checking your blood pressure at home and at different times of the  day can help to control blood pressure.  If the doctor prescribes medicine remember to take it the way the doctor ordered.  Call the office if you cannot afford the medicine or if there are questions about it.     Notes:         This is a list of the screening recommended for you and due dates:  Health Maintenance  Topic Date Due   COVID-19 Vaccine (1) Never done   Eye exam for diabetics  Never done   Cologuard (Stool DNA test)  Never done   Zoster (Shingles) Vaccine (1 of 2) Never done   Flu Shot  07/21/2022   Yearly kidney health urinalysis for diabetes  01/30/2023   Complete foot exam   01/30/2023   Hemoglobin A1C  02/03/2023   Yearly kidney function blood test for diabetes  02/28/2023   Tetanus Vaccine  12/27/2030   Hepatitis C Screening: USPSTF Recommendation to screen - Ages 18-79 yo.  Completed   HIV Screening  Completed   HPV Vaccine  Aged Out

## 2022-09-02 NOTE — Patient Instructions (Signed)
Health Maintenance, Male Adopting a healthy lifestyle and getting preventive care are important in promoting health and wellness. Ask your health care provider about: The right schedule for you to have regular tests and exams. Things you can do on your own to prevent diseases and keep yourself healthy. What should I know about diet, weight, and exercise? Eat a healthy diet  Eat a diet that includes plenty of vegetables, fruits, low-fat dairy products, and lean protein. Do not eat a lot of foods that are high in solid fats, added sugars, or sodium. Maintain a healthy weight Body mass index (BMI) is a measurement that can be used to identify possible weight problems. It estimates body fat based on height and weight. Your health care provider can help determine your BMI and help you achieve or maintain a healthy weight. Get regular exercise Get regular exercise. This is one of the most important things you can do for your health. Most adults should: Exercise for at least 150 minutes each week. The exercise should increase your heart rate and make you sweat (moderate-intensity exercise). Do strengthening exercises at least twice a week. This is in addition to the moderate-intensity exercise. Spend less time sitting. Even light physical activity can be beneficial. Watch cholesterol and blood lipids Have your blood tested for lipids and cholesterol at 64 years of age, then have this test every 5 years. You may need to have your cholesterol levels checked more often if: Your lipid or cholesterol levels are high. You are older than 64 years of age. You are at high risk for heart disease. What should I know about cancer screening? Many types of cancers can be detected early and may often be prevented. Depending on your health history and family history, you may need to have cancer screening at various ages. This may include screening for: Colorectal cancer. Prostate cancer. Skin cancer. Lung  cancer. What should I know about heart disease, diabetes, and high blood pressure? Blood pressure and heart disease High blood pressure causes heart disease and increases the risk of stroke. This is more likely to develop in people who have high blood pressure readings or are overweight. Talk with your health care provider about your target blood pressure readings. Have your blood pressure checked: Every 3-5 years if you are 18-39 years of age. Every year if you are 40 years old or older. If you are between the ages of 65 and 75 and are a current or former smoker, ask your health care provider if you should have a one-time screening for abdominal aortic aneurysm (AAA). Diabetes Have regular diabetes screenings. This checks your fasting blood sugar level. Have the screening done: Once every three years after age 45 if you are at a normal weight and have a low risk for diabetes. More often and at a younger age if you are overweight or have a high risk for diabetes. What should I know about preventing infection? Hepatitis B If you have a higher risk for hepatitis B, you should be screened for this virus. Talk with your health care provider to find out if you are at risk for hepatitis B infection. Hepatitis C Blood testing is recommended for: Everyone born from 1945 through 1965. Anyone with known risk factors for hepatitis C. Sexually transmitted infections (STIs) You should be screened each year for STIs, including gonorrhea and chlamydia, if: You are sexually active and are younger than 64 years of age. You are older than 64 years of age and your   health care provider tells you that you are at risk for this type of infection. Your sexual activity has changed since you were last screened, and you are at increased risk for chlamydia or gonorrhea. Ask your health care provider if you are at risk. Ask your health care provider about whether you are at high risk for HIV. Your health care provider  may recommend a prescription medicine to help prevent HIV infection. If you choose to take medicine to prevent HIV, you should first get tested for HIV. You should then be tested every 3 months for as long as you are taking the medicine. Follow these instructions at home: Alcohol use Do not drink alcohol if your health care provider tells you not to drink. If you drink alcohol: Limit how much you have to 0-2 drinks a day. Know how much alcohol is in your drink. In the [place] one drink equals one 12 oz bottle of beer (355 mL), one 5 oz glass of wine (148 mL), or one 1 oz glass of hard liquor (44 mL). Lifestyle Do not use any products that contain nicotine or tobacco. These products include cigarettes, chewing tobacco, and vaping devices, such as e-cigarettes. If you need help quitting, ask your health care provider. Do not use street drugs. Do not share needles. Ask your health care provider for help if you need support or information about quitting drugs. General instructions Schedule regular health, dental, and eye exams. Stay current with your vaccines. Tell your health care provider if: You often feel depressed. You have ever been abused or do not feel safe at home. Summary Adopting a healthy lifestyle and getting preventive care are important in promoting health and wellness. Follow your health care provider's instructions about healthy diet, exercising, and getting tested or screened for diseases. Follow your health care provider's instructions on monitoring your cholesterol and blood pressure. This information is not intended to replace advice given to you by your health care provider. Make sure you discuss any questions you have with your health care provider. Document Revised: 04/28/2021 Document Reviewed: 04/28/2021 Elsevier Patient Education  2023 Elsevier Inc.  

## 2022-09-04 ENCOUNTER — Ambulatory Visit: Payer: Medicare HMO

## 2022-09-07 ENCOUNTER — Ambulatory Visit (INDEPENDENT_AMBULATORY_CARE_PROVIDER_SITE_OTHER): Payer: Medicare HMO

## 2022-09-07 DIAGNOSIS — I110 Hypertensive heart disease with heart failure: Secondary | ICD-10-CM

## 2022-09-07 DIAGNOSIS — I152 Hypertension secondary to endocrine disorders: Secondary | ICD-10-CM

## 2022-09-07 DIAGNOSIS — E1165 Type 2 diabetes mellitus with hyperglycemia: Secondary | ICD-10-CM

## 2022-09-07 NOTE — Progress Notes (Signed)
Chronic Care Management Pharmacy Note  09/07/2022 Name:  Lucas Weiss MRN:  [id number] DOB:  [birth year]  Recommendation to PCP: Sent direct msg to PCP via Epic since she has visit with patient tomorrow Patient never brought in [place] paperwork, counseled him to bring in tomorrow  Subjective: Lucas Weiss is an 66 y.o. year old male who is a primary patient of [name], DO.  The CCM team was consulted for assistance with disease management and care coordination needs.    Engaged with patient by telephone for follow up visit in response to provider referral for pharmacy case management and/or care coordination services.   Consent to Services:  The patient was given information about Chronic Care Management services, agreed to services, and gave verbal consent prior to initiation of services.  Please see initial visit note for detailed documentation.   Patient Care Team: [name], DO as PCP - General (Family Medicine) [name], MD as Consulting Physician (Orthopedic Surgery) Vanita Ingles, RN as Case Manager ([place]) Lane Hacker, [facility] (Pharmacist)  Hospital visits: None in previous 6 months  Objective:  Lab Results  Component Value Date   CREATININE 1.69 (H) 02/27/2022   CREATININE 1.25 01/30/2022   CREATININE 1.24 07/08/2021    Lab Results  Component Value Date   HGBA1C 9.6 (H) 08/03/2022   HGBA1C 8.6 (H) 05/01/2022   HGBA1C 7.2 (H) 01/30/2022   Last diabetic Eye exam: No results found for: "HMDIABEYEEXA"  Last diabetic Foot exam: No results found for: "HMDIABFOOTEX"      Component Value Date/Time   CHOL 230 (H) 01/30/2022 1500   CHOL 177 07/08/2021 1404   CHOL 179 10/08/2020 1505   TRIG 366 (H) 01/30/2022 1500   TRIG 309 (H) 07/08/2021 1404   TRIG 269 (H) 10/08/2020 1505   HDL 41 01/30/2022 1500   HDL 37 (L) 07/08/2021 1404   HDL 38 (L) 10/08/2020 1505   CHOLHDL 6.3 07/17/2011 0715   VLDL 48 (H) 07/17/2011 0715    LDLCALC 124 (H) 01/30/2022 1500   LDLCALC 89 07/08/2021 1404   LDLCALC 96 10/08/2020 1505       Latest Ref Rng & Units 01/30/2022    3:00 PM 07/08/2021    2:04 PM 09/04/2019    1:40 PM  Hepatic Function  Total Protein 6.0 - 8.5 g/dL 6.7  6.6  6.7   Albumin 3.8 - 4.8 g/dL 3.7  4.2  4.2   AST 0 - 40 IU/L 23  22  36   ALT 0 - 44 IU/L 26  26  40   Alk Phosphatase 44 - 121 IU/L 118  106  78   Total Bilirubin 0.0 - 1.2 mg/dL <0.2  0.2  0.3     Lab Results  Component Value Date/Time   TSH 0.792 01/30/2022 03:00 PM   TSH 1.440 10/08/2020 03:05 PM       Latest Ref Rng & Units 01/30/2022    3:00 PM 07/08/2021    2:04 PM 10/08/2020    3:05 PM  CBC  WBC 3.4 - 10.8 x10E3/uL 7.8  6.6  7.1   Hemoglobin 13.0 - 17.7 g/dL 12.6  11.8  11.7   Hematocrit 37.5 - 51.0 % 39.0  35.4  36.7   Platelets 150 - 450 x10E3/uL 198  198  327    No results found for: "VD25OH"  Clinical ASCVD:  The ASCVD Risk score (Arnett DK, et al., 2019) failed to calculate  for the following reasons:   The patient has a prior MI or stroke diagnosis   Social History   Tobacco Use  Smoking Status Former   Packs/day: 1.50   Years: 30.00   Total pack years: 45.00   Types: Cigarettes   Quit date: 07/19/2008   Years since quitting: 14.1  Smokeless Tobacco Never   BP Readings from Last 3 Encounters:  08/03/22 106/61  06/02/22 126/68  05/01/22 112/62   Pulse Readings from Last 3 Encounters:  08/03/22 75  06/02/22 72  05/01/22 75   Wt Readings from Last 3 Encounters:  09/02/22 185 lb (83.9 kg)  08/03/22 178 lb 6.4 oz (80.9 kg)  06/02/22 183 lb (83 kg)   BMI Readings from Last 3 Encounters:  09/02/22 27.32 kg/m  08/03/22 24.88 kg/m  06/02/22 25.52 kg/m    Assessment: Review of patient past medical history, allergies, medications, health status, including review of consultants reports, laboratory and other test data, was performed as part of comprehensive evaluation and provision of chronic care  management services.   SDOH:  (Social Determinants of Health) assessments and interventions performed: Yes SDOH Interventions    Flowsheet Row Chronic Care Management from 09/07/2022 in [place] from 09/02/2022 in [place] Management from 07/06/2022 in [place] Management from 08/27/2021 in [place] Visit from 02/06/2016 in [place] Visit from 01/09/2016 in [place] Interventions -- Intervention Not Indicated -- -- -- --  Housing Interventions -- Intervention Not Indicated -- -- -- --  Transportation Interventions Intervention Not Indicated Intervention Not Indicated Intervention Not Indicated -- -- --  Utilities Interventions -- Intervention Not Indicated -- -- -- --  Alcohol Usage Interventions -- Intervention Not Indicated (Score <7) -- -- -- --  Depression Interventions/Treatment  -- Medication, Currently on Treatment -- -- Currently on Treatment Currently on Treatment  Financial Strain Interventions Other (Comment)  [PAP] Intervention Not Indicated Other (Comment)  [Will ask PCP about PAP] -- -- --  Physical Activity Interventions -- Intervention Not Indicated -- Other (Comments)  [no structured activity] -- --  Stress Interventions -- Intervention Not Indicated -- -- -- --  Social Connections Interventions -- Intervention Not Indicated -- -- -- --       [place]  No Known Allergies  Medications Reviewed Today     Reviewed by Lane Hacker, [facility] (Pharmacist) on 09/07/22 at [place] List Status: <None>   Medication Order Taking? Sig Documenting Provider Last Dose Status Informant  amLODipine (NORVASC) 2.5 MG tablet [id number]  Take 1 tablet (2.5 mg total) by mouth daily. Park Liter P, DO  Active   aspirin EC 81 MG tablet [id number]  Take 81 mg by mouth daily. [provider]   Active   Blood Glucose Monitoring Suppl (ONETOUCH VERIO) w/Device KIT [id number]  To check blood sugar twice a day and document, bring to provider visits. Marnee Guarneri T, NP  Active   DULoxetine (CYMBALTA) 20 MG capsule [id number]  Take 1 capsule (20 mg total) by mouth daily.  Patient not taking: Reported on 09/02/2022   [name], DO  Active   glipiZIDE (GLUCOTROL) 5 MG tablet [id number]  Take 1 tablet (5 mg total) by mouth 2 (two) times daily before a meal. Wynetta Emery, Megan P, DO  Active   glucose blood (ONETOUCH VERIO) test strip [id number]  To check blood sugar twice a day and document, bring to provider visits. Marnee Guarneri T, NP  Active   Lancets (ONETOUCH ULTRASOFT) lancets [id number]  To check blood sugar twice a day and document, bring to provider visits. Marnee Guarneri T, NP  Active   lisinopril (ZESTRIL) 40 MG tablet [id number]  Take 1 tablet (40 mg total) by mouth daily. Johnson, Megan P, DO  Active   lovastatin (MEVACOR) 40 MG tablet [id number]  Take 1 tablet (40 mg total) by mouth at bedtime. Johnson, Megan P, DO  Active   metFORMIN (GLUCOPHAGE-XR) 500 MG 24 hr tablet [id number]  Take 1 tablet (500 mg total) by mouth daily with breakfast. Wynetta Emery, Megan P, DO  Active   metoprolol tartrate (LOPRESSOR) 50 MG tablet [id number]  Take 1 tablet (50 mg total) by mouth daily. [name], DO  Active             Patient Active Problem List   Diagnosis Date Noted   Multiple rib fractures 10/08/2020   Insomnia 05/06/2017   History of left hip replacement 05/19/2016   Controlled type 2 diabetes mellitus ([place]) 01/09/2016   Tobacco abuse, in remission 01/09/2016   Severe recurrent major depression ([place]) 06/14/2015   Chronic pain 06/14/2015   Alcohol abuse, in remission    Atherosclerotic PVD with intermittent claudication ([place]) 10/25/2014   Hypertension associated with diabetes ([place]) 09/17/2014   Renal artery stenosis, native, bilateral ([place]) 02/20/2013   CAD (coronary artery  disease) 09/16/2012   PAD (peripheral artery disease): Left common iliac stent plus additional stent(09/15/12) for ISRS. 09/16/2012   S/P CABG x 5: 2009: LIMA to LAD, SVG to Diag, Seq SVG to OM2-3, SVG to PDA 09/16/2012   Hypertensive heart failure ([place]) 09/16/2012   Hyperlipidemia associated with type 2 diabetes mellitus ([place]) 09/16/2012    Immunization History  Administered Date(s) Administered   Influenza Split 09/16/2012   Influenza,inj,Quad PF,6+ Mos 09/06/2015, 09/14/2016, 09/09/2017, 10/17/2018, 09/04/2019, 10/04/2020, 10/22/2021   Influenza-Unspecified 09/12/2014   Pneumococcal Polysaccharide-23 05/28/2010, 05/20/2016   Td 12/27/2020   Tdap 05/28/2010    Conditions to be addressed/monitored: CAD, PVD, bilateral renal artery stenosis, CHF, DMII, HTN, HLD, alcohol and tobacco abuse in remission  Care Plan : [place] plan  Updates made by Lane Hacker, RPH since 09/07/2022 12:00 AM     Problem: DM, HTN, HLD   Priority: High     Long-Range Goal: Disease management   Start Date: 09/09/2021  Expected End Date: 09/09/2022  Recent Progress: Not on track  Priority: High  Note:   C urrent Barriers:  Unable to independently afford treatment regimen Unable to independently monitor therapeutic efficacy Unable to achieve control of DM   Pharmacist Clinical Goal(s):  Patient will verbalize ability to afford treatment regimen achieve adherence to monitoring guidelines and medication adherence to achieve therapeutic efficacy contact provider office for questions/concerns as evidenced notation of same in electronic health record through collaboration with PharmD and provider.   Interventions: 1:1 collaboration with [name], DO regarding development and update of comprehensive plan of care as evidenced by provider attestation and co-signature Inter-disciplinary care team collaboration (see longitudinal plan of care) Comprehensive medication review performed;  medication list updated in electronic medical record  Hypertension (BP goal <140/90) BP Readings from Last 3 Encounters:  06/02/22 126/68  05/01/22 112/62  02/27/22 122/70  -Controlled -Current treatment: Metoprolol tartrate 50 mg twice daily Appropriate, Effective, Safe, Accessible Lisinopril 40 mg daily Appropriate, Effective, Safe, Accessible Amlodipine  2.5 mg daily Query Appropriate, Effective, Safe, Accessible Patient still taking 45m as of 09/07/22 -Current home readings:  Sept 2023: Didn't have readings on him -Reviewed home BP monitoring techniques -Denies hypotensive/hypertensive symptoms -Educated on BP goals and benefits of medications for prevention of heart attack, stroke and kidney damage; Proper BP monitoring technique; -Counseled to monitor BP at home 2-3x/wk, document, and provide log at future appointments -Counseled on diet and exercise extensively Sept 2023: Onboard to [facility] to prevent this mix-up again  Hyperlipidemia: (LDL goal < 100) The ASCVD Risk score (Arnett DK, et al., 2019) failed to calculate for the following reasons:   The patient has a prior MI or stroke diagnosis Lab Results  Component Value Date   CHOL 230 (H) 01/30/2022   CHOL 177 07/08/2021   CHOL 179 10/08/2020   Lab Results  Component Value Date   HDL 41 01/30/2022   HDL 37 (L) 07/08/2021   HDL 38 (L) 10/08/2020   Lab Results  Component Value Date   LDLCALC 124 (H) 01/30/2022   LDLCALC 89 07/08/2021   LDLCALC 96 10/08/2020   Lab Results  Component Value Date   TRIG 366 (H) 01/30/2022   TRIG 309 (H) 07/08/2021   TRIG 269 (H) 10/08/2020   Lab Results  Component Value Date   CHOLHDL 6.3 07/17/2011   CHOLHDL 6.7 07/19/2008  No results found for: "LDLDIRECT" -Not ideally controlled -Secondary prevention  -Current treatment: Lovastatin 40 mg once daily Query Appropriate -Reviewed tolerability/side effects - no problems noted. -On chart review hx or atorvastatin and  rosuvastatin noted. No recall of side effects.  -Educated on Cholesterol goals;  Benefits of statin for ASCVD risk reduction; July 2023: Consider switch to high intensity statin, atorvastatin 80 mg - next PCP visit 10/20  Sept 2023: Defer to PCP  Diabetes (A1c goal <7%) -Not ideally controlled -GFR >70 -Current medications: Metformin 500 ER Query Appropriate  Glipizide 566mBID Query Appropriate,  Farxiga (PAP) Appropriate, Query effective,  -Medications previously tried: Glipizide, [employer]), Metformin IR -Current home glucose readings fasting glucose:  not reported post prandial glucose: not reported  -Denies hypoglycemic/hyperglycemic symptoms -Current meal patterns: low carb -Current exercise: work around the house - in the process of moving. -Educated on A1c and blood sugar goals; Benefits of routine self-monitoring of blood sugar; July 2023: Patient states he doesn't check sugars. Metformin upsets his stomach. Will ask PCP about changing to ER and about Farxiga PAP Sept 2023: Patient changed from Metformin IR->ER but states "I completely stopped Metformin." Counseled to start ER but he was confused. Recommended bring in all meds to PCP visit tomorrow. Farxiga paperwork faxed out but he hasn't brought in yet. Counseled to bring in tomorrow. Sent direct msg to PCP explaining this   F/U: CPP F/U Jan 2024  [place] ConstablePharm.D. - [phone]     Verbal consent obtained for [facility] enhanced pharmacy services (medication synchronization, adherence packaging, delivery coordination). A medication sync plan was created to allow patient to get all medications delivered once every 30 to 90 days per patient preference. Patient understands they have freedom to choose pharmacy and clinical pharmacist will coordinate care between all prescribers and [facility]. -Patient taking wrong Amlodipine dose, not taking DM meds, and is very confused about meds. Onboard to  [facility] to take care of this  Future Appointments  Date Time Provider De[place]9/19/2023  3:40 PM JoValerie RoysDO [facility] P[facility]  12/28/2022  1:00 PM [facility]FP CCM PHARMACY  [facility]   CPP F/U Jan 2024  [place] Constable, Pharm.D. - [phone]

## 2022-09-07 NOTE — Patient Instructions (Signed)
Visit Information   Goals Addressed   None    Patient Care Plan: RNCMN: Diabetes Type 2 (Adult)     Problem Identified: RNCM: Glycemic Management (Diabetes, Type 2)   Priority: High     Long-Range Goal: RNCM: Glycemic Management Optimized   Start Date: 01/22/2021  Expected End Date: 08/27/2022  This Visit's Progress: Not on track  Priority: High  Note:   .Objective:  Lab Results  Component Value Date   HGBA1C 7.3 (H) 07/08/2021     Lab Results  Component Value Date   CREATININE 1.24 07/08/2021   CREATININE 1.12 10/08/2020   CREATININE 1.07 02/29/2020    No results found for: EGFR Current Barriers:  Knowledge Deficits related to basic Diabetes pathophysiology and self care/management Knowledge Deficits related to medications used for management of diabetes Difficulty obtaining or cannot afford medications Does not have glucometer to monitor blood sugar Financial Constraints Cognitive Deficits Limited Social Support Unable to independently manage DM Unable to self administer medications as prescribed Does not adhere to provider recommendations re: checking blood sugars and medications as prescribed  Does not adhere to prescribed medication regimen Lacks social connections Does not maintain contact with provider office Does not contact provider office for questions/concerns  Case Manager Clinical Goal(s):  patient will demonstrate improved adherence to prescribed treatment plan for diabetes self care/management as evidenced by: daily monitoring and recording of CBG  adherence to ADA/ carb modified diet exercise 3/4 days/week adherence to prescribed medication regimen Interventions:  Collaboration with [name], DO regarding development and update of comprehensive plan of care as evidenced by provider attestation and co-signature Inter-disciplinary care team collaboration (see longitudinal plan of care) Provided education to patient about basic DM disease process.  05/21/2021 Reviewed education on S&S of hypoglycemia since patient has not been eating well over the last 5 days due to a GI issue: diarrhea with blood present and black stools.  Symptoms have relieved today and no further diarrhea or black stools.  Instructed patient to contact [facility] office if GI symptoms return. Reviewed medications with patient and discussed importance of medication adherence.  05/21/2021 Patient not able to afford Jardiance and has not started on it. Patient asking for different medication for diabetes that he can afford. 08-27-2021: The patient still not taking jardiance due to cost constraints. The patient states he is taking Metformin. Will re-refer the patient to pharm D for assistance.  Discussed plans with patient for ongoing care management follow up and provided patient with direct contact information for care management team Provided patient with written educational materials related to hypo and hyperglycemia and importance of correct treatment Reviewed scheduled/upcoming provider appointments including: 10-09-2021 at 3 pm Advised patient, providing education and rationale, to check cbg daily  and record, calling pcp for findings outside established parameters.  08-27-2021: The patient is not checking his blood sugars every day but usually when he does it is afternoon or night. He states his readings are in the 300's and sometimes well over 300's. Denies any lows. The patient educated on fasting blood sugar goal of <130 and post prandial of <180.  The patient states he just can't afford Jardiance. Will collaborate with the pcp and pharm D.  Patient did obtain a glucose meter and is now checking his blood sugars once a week (usually in the evenings) with range in the lower 300 (328-329).  Patient checked blood glucose during contact with result 184.  Patient not taking Jardiance due to inability to purchase because  of how expensive medication is. Patient asking for different medication for  diabetes that he can afford. Patient is awaiting pharmacy contact to help with possibly obtaining Jardiance. 08-27-2021: Currently is still taking Metformin but not Jardiance. Would consider a cheaper alternatives.  Review of patient status, including review of consultants reports, relevant laboratory and other test results, and medications completed. Patient Goals/Self-Care Activities  patient will:  - UNABLE to independently manage DM Self administers oral medications as prescribed Attends all scheduled provider appointments Checks blood sugars as prescribed and utilize hyper and hypoglycemia protocol as needed Adheres to prescribed ADA/carb modified- currently is not following a restricted diet. Discussed referral for care guides but the patient refuses at this time. Encouraged compliance  - barriers to adherence to treatment plan identified - blood glucose monitoring encouraged - resources required to improve adherence to care identified - self-awareness of signs/symptoms of hypo or hyperglycemia encouraged  Follow Up Plan: Telephone follow up appointment with care management team member scheduled for:  10-21-2021 at 11:45am    Task: RNCM: Alleviate Barriers to Glycemic Management Completed 08/27/2021  Outcome: Positive  Note:   Care Management Activities:    - barriers to adherence to treatment plan identified - blood glucose monitoring encouraged - resources required to improve adherence to care identified - self-awareness of signs/symptoms of hypo or hyperglycemia encouraged    Notes: the patient currently does not have a glucose meter. Working the [employer] D for assistance with meter.     Patient Care Plan: RNCM: Hypertension (Adult)     Problem Identified: RNCM: Hypertension (Hypertension)   Priority: Medium     Long-Range Goal: RNCM: Hypertension Monitored   Start Date: 01/22/2021  Expected End Date: 06/09/2022  This Visit's Progress: On track  Priority: Medium  Note:    Objective:  Last practice recorded BP readings:  BP Readings from Last 3 Encounters:  07/11/21 140/60  07/08/21 132/64  03/27/21 (!) 159/89    Most recent eGFR/CrCl: No results found for: EGFR  No components found for: CRCL Current Barriers:  Knowledge Deficits related to basic understanding of hypertension pathophysiology and self care management Knowledge Deficits related to understanding of medications prescribed for management of hypertension Difficulty obtaining medications Non-adherence to prescribed medication regimen Cognitive Deficits Unable to independently manage HTN Lacks social connections Does not contact provider office for questions/concerns Case Manager Clinical Goal(s):   patient will verbalize understanding of plan for hypertension management  patient will attend all scheduled medical appointments: 10-09-2021 at 3 pm  patient will demonstrate improved adherence to prescribed treatment plan for hypertension as evidenced by taking all medications as prescribed, monitoring and recording blood pressure as directed, adhering to low sodium/DASH diet  patient will demonstrate improved health management independence as evidenced by checking blood pressure as directed and notifying PCP if SBP>160 or DBP > 90, taking all medications as prescribe, and adhering to a low sodium diet as discussed.  patient will verbalize basic understanding of hypertension disease process and self health management plan as evidenced by compliance with heart healthy diet, compliance with medications, working with the CCM team to manage health and well being.  Interventions:  Collaboration with [name], DO regarding development and update of comprehensive plan of care as evidenced by provider attestation and co-signature Inter-disciplinary care team collaboration (see longitudinal plan of care) Evaluation of current treatment plan related to hypertension self management and patient's adherence  to plan as established by provider. 05/21/2021: Patient not consistent with monitoring blood pressure.  Patient checks blood pressure about once a month with systolic number around 941/?.  Patient states he can tell when his blood pressure is elevated or low. 08-27-2021: The patient is doing well with management of blood pressures. Did follow up with the cardiologist after complaint of CP. The patient states the cardiologist wanted him to have an ECHO but he cancelled it. He denies any further episodes of CP or discomfort. Feels the CP came from when he had COVID. Review of what worse looks like and when to seek emergent care.  Provided education to patient re: stroke prevention, s/s of heart attack and stroke, DASH diet, complications of uncontrolled blood pressure.  08-27-2021 Reviewed this education with patient. Reviewed medications with patient and discussed importance of compliance. 08-27-2021: The patient states compliance with medications for HTN management  Discussed plans with patient for ongoing care management follow up and provided patient with direct contact information for care management team Advised patient, providing education and rationale, to monitor blood pressure daily and record, calling PCP for findings outside established parameters.  Reviewed scheduled/upcoming provider appointments including: 10-09-2021 at 3 pm Patient Goals/Self-Care Activities  patient will:  - Self administers medications as prescribed Attends all scheduled provider appointments Calls provider office for new concerns, questions, or BP outside discussed parameters Checks BP and records as discussed Follows a low sodium diet/DASH diet - depression screen reviewed - home or ambulatory blood pressure monitoring encouraged Follow Up Plan: Telephone follow up appointment with care management team member scheduled for: 10-21-2021 at 11:45 am    Task: RNCM: Identify and Monitor Blood Pressure Elevation Completed  08/27/2021  Outcome: Positive  Note:   Care Management Activities:    - depression screen reviewed - home or ambulatory blood pressure monitoring encouraged        Patient Care Plan: RNCM: Coronary Artery Disease (Adult) and HLD     Problem Identified: RNCM: Disease Progression (Coronary Artery Disease) and HLD   Priority: Medium     Long-Range Goal: RNCM: Disease Progression Prevented or Minimizeda; CAD and HLD   Start Date: 01/22/2021  Expected End Date: 06/09/2022  This Visit's Progress: On track  Priority: Medium  Note:   Current Barriers:  Poorly controlled hyperlipidemia, complicated by non-compliance with dietary restrictions, cost constraints, chronic conditions  Current antihyperlipidemic regimen: Lovastatin 20 mg QD Most recent lipid panel:  Lab Results  Component Value Date   CHOL 177 07/08/2021   CHOL 179 10/08/2020   CHOL 201 (H) 09/04/2019   Lab Results  Component Value Date   HDL 37 (L) 07/08/2021   HDL 38 (L) 10/08/2020   HDL 37 (L) 09/04/2019   Lab Results  Component Value Date   LDLCALC 89 07/08/2021   [place] 96 10/08/2020   LDLCALC 100 (H) 09/04/2019   Lab Results  Component Value Date   TRIG 309 (H) 07/08/2021   TRIG 269 (H) 10/08/2020   TRIG 377 (H) 09/04/2019   Lab Results  Component Value Date   CHOLHDL 6.3 07/17/2011   CHOLHDL 6.7 07/19/2008   No results found for: LDLDIRECT  ASCVD risk enhancing conditions: age 61, DM, HTN, past history of stroke, former smoker Unable to self administer medications as prescribed Does not adhere to prescribed medication regimen Lacks social connections Does not contact provider office for questions/concerns  RN Care Manager Clinical Goal(s):   patient will work with Consulting civil engineer, providers, and care team towards execution of optimized self-health management plan  patient will verbalize understanding of plan  for effective management of HLD and CAD  patient will work with [facility] [place] team and pcp  to address needs related to HLD and CAD  patient will attend all scheduled medical appointments: 10-09-2021  Interventions: Collaboration with [name], DO regarding development and update of comprehensive plan of care as evidenced by provider attestation and co-signature Inter-disciplinary care team collaboration (see longitudinal plan of care) Medication review performed; medication list updated in electronic medical record.  Inter-disciplinary care team collaboration (see longitudinal plan of care) Referred to pharmacy team for assistance with HLD medication management Evaluation of current treatment plan related to HLD and CAD and patient's adherence to plan as established by provider. 08-27-2021: The patient feels he is doing better since getting COVID. States he is eating good. Is a little stressed because he is going to have to move. Someone bought the property his trailer is on and he is looking for someone to move his mobile home. He has 3 weeks to move it. He decline care guide referral.  The patient has the [facility] contact information to call if he changes his mind. Will continue to monitor for changes or new needs.  Advised patient to call the office for changes in condition or question  Provided education to patient re: benefits of heart health diet and following recommendations of the provider. 08-27-2021 Discussed with patient importance of heart healthy diet.  Patient states it is hard to get good healthy foods due to the cost of them. Reviewed scheduled/upcoming provider appointments including: 10-09-2021 Discussed plans with patient for ongoing care management follow up and provided patient with direct contact information for care management team Patient Goals/Self-Care Activities: patient will:   - call for medicine refill 2 or 3 days before it runs out - call if I am sick and can't take my medicine - keep a list of all the medicines I take; vitamins and herbals too - learn to  read medicine labels - use a pillbox to sort medicine - use an alarm clock or phone to remind me to take my medicine - change to whole grain breads, cereal, pasta - drink 6 to 8 glasses of water each day - eat 3 to 5 servings of fruits and vegetables each day - eat 5 or 6 small meals each day - fill half the plate with nonstarchy vegetables - keep a food diary - limit fast food meals to no more than 1 per week - manage portion size - prepare main meal at home 3 to 5 days each week - read food labels for fat, fiber, carbohydrates and portion size - be open to making changes - I can manage, know and watch for signs of a heart attack - if I have chest pain, call for help - learn about small changes that will make a big difference - learn my personal risk factors  - barriers to treatment adherence reviewed and addressed - difficulty of making life-long changes acknowledged - functional limitation screening reviewed - healthy lifestyle promoted - individualized medical nutrition therapy provided - medication-adherence assessment completed - response to pharmacologic therapy monitored - self-awareness of signs/symptoms of worsening disease encouraged  Follow Up Plan: Telephone follow up appointment with care management team member scheduled for: 10-21-2021 at 11:45am      Task: RNCM: Alleviate Barriers to Coronary Artery Disease Therapy Completed 08/27/2021  Outcome: Positive  Note:   Care Management Activities:    - barriers to treatment adherence reviewed and addressed - difficulty of making  life-long changes acknowledged - functional limitation screening reviewed - healthy lifestyle promoted - individualized medical nutrition therapy provided - medication-adherence assessment completed - response to pharmacologic therapy monitored - self-awareness of signs/symptoms of worsening disease encouraged        Patient Care Plan: RNCM: Stroke (Adult)     Problem Identified:  RNCM: Emotional Adjustment to Disease (Stroke)   Priority: Medium     Long-Range Goal: RNCM: Optimal Coping   Start Date: 01/22/2021  Expected End Date: 08/09/2022  This Visit's Progress: On track  Priority: Medium  Note:   Current Barriers:  Knowledge Deficits related to resources to help with expressed needs for patient with chronic conditions and post residual stroke management  Chronic Disease Management support and education needs related to past history of stroke  Financial Constraints.  Cognitive Deficits Unable to self administer medications as prescribed Does not adhere to provider recommendations re: dietary restrictions  Does not adhere to prescribed medication regimen Lacks social connections Does not contact provider office for questions/concerns  Nurse Case Manager Clinical Goal(s):   patient will verbalize understanding of plan for effective management of post residual stroke  patient will work with [facility], CCM team, and pcp to address needs related to meeting patients needs due to cognitive concerns and changes in condition   patient will attend all scheduled medical appointments: 03-27-2021  Interventions:  1:1 collaboration with [name], DO regarding development and update of comprehensive plan of care as evidenced by provider attestation and co-signature Inter-disciplinary care team collaboration (see longitudinal plan of care) Evaluation of current treatment plan related to post residual stroke management  and patient's adherence to plan as established by provider. 08-27-2021: the patient states that he still has issues with his memory since having his stroke but he is doing the best he can. Has been through a lot with losing his wife, home, businesses, and livelihood.  States that he is going to have to move because the property was sold. He is trying to find someone now that can move his mobile home to a place on [address].  He states he is doing okay but has  had a rough time. Declined getting a new ECHO after his CP resolved and he did not feel the need to  have it any longer.  He is managing the best he can. His sister tries to help him with insurance and things like that but it actually was worse than what he had. He doesn't want her to be upset with him so he has kept it. The granddaughter is a good support system for him.  Denies any acute needs at this time. .  Advised patient to call the office for changes in condition or new questions  Provided education to patient re: following plan of care, adherence to a heart healthy/ADA diet, safety, and compliance with medications. 08-27-2021: Reviewed with the patient dietary restrictions, compliance with medications and working with the CCM team to meet health and wellness needs.  Discussed plans with patient for ongoing care management follow up and provided patient with direct contact information for care management team  Patient Goals/Self-Care Activities  patient will:  - Patient will self administer medications as prescribed Patient will attend all scheduled provider appointments Patient will call pharmacy for medication refills Patient will attend church or other social activities Patient will continue to perform ADL's independently Patient will continue to perform IADL's independently Patient will call provider office for new concerns or questions Patient will work  with BSW to address care coordination needs and will continue to work with the clinical team to address health care and disease management related needs.   - counseling provided - decision-making supported - depression screen reviewed - goal-setting facilitated - positive reinforcement provided - problem-solving facilitated - relaxation techniques promoted - self-care encouraged - self-reflection promoted - verbalization of feelings encouraged  Follow Up Plan: Telephone follow up appointment with care management team member  scheduled for: 10-21-2021 at 1145 am         Task: RNCM: Support Psychosocial Response to Stroke Completed 08/27/2021  Outcome: Positive  Note:   Care Management Activities:    - counseling provided - decision-making supported - depression screen reviewed - goal-setting facilitated - positive reinforcement provided - problem-solving facilitated - relaxation techniques promoted - self-care encouraged - self-reflection promoted - verbalization of feelings encouraged        Patient Care Plan: CCM Pharmacy Care plan     Problem Identified: DM, HTN, HLD   Priority: High     Long-Range Goal: Disease management   Start Date: 09/09/2021  Expected End Date: 09/09/2022  Recent Progress: Not on track  Priority: High  Note:   C urrent Barriers:  Unable to independently afford treatment regimen Unable to independently monitor therapeutic efficacy Unable to achieve control of DM   Pharmacist Clinical Goal(s):  Patient will verbalize ability to afford treatment regimen achieve adherence to monitoring guidelines and medication adherence to achieve therapeutic efficacy contact provider office for questions/concerns as evidenced notation of same in electronic health record through collaboration with PharmD and provider.   Interventions: 1:1 collaboration with [name], DO regarding development and update of comprehensive plan of care as evidenced by provider attestation and co-signature Inter-disciplinary care team collaboration (see longitudinal plan of care) Comprehensive medication review performed; medication list updated in electronic medical record  Hypertension (BP goal <140/90) BP Readings from Last 3 Encounters:  06/02/22 126/68  05/01/22 112/62  02/27/22 122/70  -Controlled -Current treatment: Metoprolol tartrate 50 mg twice daily Appropriate, Effective, Safe, Accessible Lisinopril 40 mg daily Appropriate, Effective, Safe, Accessible Amlodipine 2.5 mg daily  Query Appropriate, Effective, Safe, Accessible Patient still taking 63m as of 09/07/22 -Current home readings:  Sept 2023: Didn't have readings on him -Reviewed home BP monitoring techniques -Denies hypotensive/hypertensive symptoms -Educated on BP goals and benefits of medications for prevention of heart attack, stroke and kidney damage; Proper BP monitoring technique; -Counseled to monitor BP at home 2-3x/wk, document, and provide log at future appointments -Counseled on diet and exercise extensively Sept 2023: Onboard to [employer] to prevent this mix-up again  Hyperlipidemia: (LDL goal < 100) The ASCVD Risk score (Arnett DK, et al., 2019) failed to calculate for the following reasons:   The patient has a prior MI or stroke diagnosis Lab Results  Component Value Date   CHOL 230 (H) 01/30/2022   CHOL 177 07/08/2021   CHOL 179 10/08/2020   Lab Results  Component Value Date   HDL 41 01/30/2022   HDL 37 (L) 07/08/2021   HDL 38 (L) 10/08/2020   Lab Results  Component Value Date   LDLCALC 124 (H) 01/30/2022   LDLCALC 89 07/08/2021   LDLCALC 96 10/08/2020   Lab Results  Component Value Date   TRIG 366 (H) 01/30/2022   TRIG 309 (H) 07/08/2021   TRIG 269 (H) 10/08/2020   Lab Results  Component Value Date   CHOLHDL 6.3 07/17/2011   CHOLHDL 6.7 07/19/2008  No results found  for: "LDLDIRECT" -Not ideally controlled -Secondary prevention  -Current treatment: Lovastatin 40 mg once daily Query Appropriate -Reviewed tolerability/side effects - no problems noted. -On chart review hx or atorvastatin and rosuvastatin noted. No recall of side effects.  -Educated on Cholesterol goals;  Benefits of statin for ASCVD risk reduction; July 2023: Consider switch to high intensity statin, atorvastatin 80 mg - next PCP visit 10/20  Sept 2023: Defer to PCP  Diabetes (A1c goal <7%) -Not ideally controlled -GFR >70 -Current medications: Metformin 500 ER Query Appropriate  Glipizide 20m BID  Query Appropriate,  Farxiga (PAP) Appropriate, Query effective,  -Medications previously tried: Glipizide, FMuseum/gallery curator(Cost), Metformin IR -Current home glucose readings fasting glucose:  not reported post prandial glucose: not reported  -Denies hypoglycemic/hyperglycemic symptoms -Current meal patterns: low carb -Current exercise: work around the house - in the process of moving. -Educated on A1c and blood sugar goals; Benefits of routine self-monitoring of blood sugar; July 2023: Patient states he doesn't check sugars. Metformin upsets his stomach. Will ask PCP about changing to ER and about Farxiga PAP Sept 2023: Patient changed from Metformin IR->ER but states "I completely stopped Metformin." Counseled to start ER but he was confused. Recommended bring in all meds to PCP visit tomorrow. Farxiga paperwork faxed out but he hasn't brought in yet. Counseled to bring in tomorrow. Sent direct msg to PCP explaining this   F/U: CPP F/U Jan 2024  NArizona Constable Pharm.D. - [phone]      Mr. MZabriskiewas given information about Chronic Care Management services today including:  CCM service includes personalized support from designated clinical staff supervised by his physician, including individualized plan of care and coordination with other care providers 24/7 contact phone numbers for assistance for urgent and routine care needs. Standard insurance, coinsurance, copays and deductibles apply for chronic care management only during months in which we provide at least 20 minutes of these services. Most insurances cover these services at 100%, however patients may be responsible for any copay, coinsurance and/or deductible if applicable. This service may help you avoid the need for more expensive face-to-face services. Only one practitioner may furnish and bill the service in a calendar month. The patient may stop CCM services at any time (effective at the end of the month) by phone call to the  office staff.  Patient agreed to services and verbal consent obtained.   The patient verbalized understanding of instructions, educational materials, and care plan provided today and DECLINED offer to receive copy of patient instructions, educational materials, and care plan.  The pharmacy team will reach out to the patient again over the next 60 days.   NLane Hacker RLuzerne

## 2022-09-08 ENCOUNTER — Encounter: Payer: Self-pay | Admitting: Family Medicine

## 2022-09-08 ENCOUNTER — Ambulatory Visit (INDEPENDENT_AMBULATORY_CARE_PROVIDER_SITE_OTHER): Payer: Medicare HMO | Admitting: Family Medicine

## 2022-09-08 VITALS — BP 128/75 | HR 59 | Temp 98.1°F | Wt 183.1 lb

## 2022-09-08 DIAGNOSIS — Z23 Encounter for immunization: Secondary | ICD-10-CM | POA: Diagnosis not present

## 2022-09-08 DIAGNOSIS — F332 Major depressive disorder, recurrent severe without psychotic features: Secondary | ICD-10-CM

## 2022-09-08 DIAGNOSIS — G894 Chronic pain syndrome: Secondary | ICD-10-CM | POA: Diagnosis not present

## 2022-09-08 DIAGNOSIS — E1159 Type 2 diabetes mellitus with other circulatory complications: Secondary | ICD-10-CM | POA: Diagnosis not present

## 2022-09-08 DIAGNOSIS — E785 Hyperlipidemia, unspecified: Secondary | ICD-10-CM | POA: Diagnosis not present

## 2022-09-08 DIAGNOSIS — E1169 Type 2 diabetes mellitus with other specified complication: Secondary | ICD-10-CM

## 2022-09-08 DIAGNOSIS — R69 Illness, unspecified: Secondary | ICD-10-CM | POA: Diagnosis not present

## 2022-09-08 DIAGNOSIS — I152 Hypertension secondary to endocrine disorders: Secondary | ICD-10-CM | POA: Diagnosis not present

## 2022-09-08 MED ORDER — AMLODIPINE BESYLATE 5 MG PO TABS: 5.0000 mg | ORAL_TABLET | Freq: Every day | ORAL | 1 refills | Status: DC

## 2022-09-08 NOTE — Assessment & Plan Note (Signed)
Did not restart his cymbalta. Restart. Recheck 2 months. Call with any concerns.

## 2022-09-08 NOTE — Assessment & Plan Note (Signed)
Did not start his XR metformin. Will start it continue glipizide. Recheck 2 months with A1c. Call with any concerns.

## 2022-09-08 NOTE — Assessment & Plan Note (Signed)
Doing well on the 5mg  amlodipine. Will keep him on that. Continue to monitor. Call with any concerns.

## 2022-09-08 NOTE — Progress Notes (Signed)
BP 128/75   Pulse (!) 59   Temp 98.1 F (36.7 C)   Wt 183 lb 1.6 oz (83.1 kg)   SpO2 98%   BMI 27.04 kg/m    Subjective:    Patient ID: Lucas Weiss, male    DOB: 05-31-1956, 66 y.o.   MRN: [id number]  HPI: [name] is a 66 y.o. male  Chief Complaint  Patient presents with   Hypertension    Patient states he received amlodipine 5mg  at pharmacy and did not know to be taking 2.5mg     Diabetes    Patient states he is not taking Metformin, only glipizide   Depression    Patient states he is not taking cymbalta    Pain   HYPERTENSION  Hypertension status: controlled  Satisfied with current treatment? yes Duration of hypertension: chronic BP monitoring frequency:  not checking BP medication side effects:  no Medication compliance: good compliance Previous BP meds: lisinopril, metoprolol, amlodipine Aspirin: yes Recurrent headaches: no Visual changes: no Palpitations: no Dyspnea: no Chest pain: no Lower extremity edema: no Dizzy/lightheaded: no  DIABETES Hypoglycemic episodes:no Polydipsia/polyuria: no Visual disturbance: no Chest pain: no Paresthesias: yes Glucose Monitoring: no Taking Insulin?: no Blood Pressure Monitoring: not checking Retinal Examination: Not up to Date Foot Exam: Up to Date Diabetic Education: Completed Pneumovax: Up to Date Influenza: Up to Date Aspirin: yes  CHRONIC PAIN  Present dose: 0 Morphine equivalents Pain control status: uncontrolled Duration: chronic Location: low back and legs Quality: aching and sore Current Pain Level: moderate Previous Pain Level: mild Breakthrough pain: yes Benefit from narcotic medications: N/A Previous pain specialty evaluation: yes Non-narcotic analgesic meds: yes Narcotic contract: no  Relevant past medical, surgical, family and social history reviewed and updated as indicated. Interim medical history since our last visit reviewed. Allergies and medications reviewed and  updated.  Review of Systems  Constitutional:  Positive for fatigue. Negative for activity change, appetite change, chills, diaphoresis, fever and unexpected weight change.  HENT: Negative.    Respiratory: Negative.    Cardiovascular: Negative.   Gastrointestinal: Negative.   Musculoskeletal:  Positive for back pain and myalgias. Negative for arthralgias, gait problem, joint swelling, neck pain and neck stiffness.  Skin: Negative.   Neurological: Negative.   Psychiatric/Behavioral: Negative.      Per HPI unless specifically indicated above     Objective:    BP 128/75   Pulse (!) 59   Temp 98.1 F (36.7 C)   Wt 183 lb 1.6 oz (83.1 kg)   SpO2 98%   BMI 27.04 kg/m   Wt Readings from Last 3 Encounters:  09/08/22 183 lb 1.6 oz (83.1 kg)  09/02/22 185 lb (83.9 kg)  08/03/22 178 lb 6.4 oz (80.9 kg)    Physical Exam Vitals and nursing note reviewed.  Constitutional:      General: He is not in acute distress.    Appearance: Normal appearance. He is normal weight. He is not ill-appearing, toxic-appearing or diaphoretic.  HENT:     Head: Normocephalic and atraumatic.     Right Ear: External ear normal.     Left Ear: External ear normal.     Nose: Nose normal.     Mouth/Throat:     Mouth: Mucous membranes are moist.     Pharynx: Oropharynx is clear.  Eyes:     General: No scleral icterus.       Right eye: No discharge.        Left eye:  No discharge.     Extraocular Movements: Extraocular movements intact.     Conjunctiva/sclera: Conjunctivae normal.     Pupils: Pupils are equal, round, and reactive to light.  Cardiovascular:     Rate and Rhythm: Normal rate and regular rhythm.     Pulses: Normal pulses.     Heart sounds: Normal heart sounds. No murmur heard.    No friction rub. No gallop.  Pulmonary:     Effort: Pulmonary effort is normal. No respiratory distress.     Breath sounds: Normal breath sounds. No stridor. No wheezing, rhonchi or rales.  Chest:     Chest  wall: No tenderness.  Musculoskeletal:        General: Normal range of motion.     Cervical back: Normal range of motion and neck supple.  Skin:    General: Skin is warm and dry.     Capillary Refill: Capillary refill takes less than 2 seconds.     Coloration: Skin is not jaundiced or pale.     Findings: No bruising, erythema, lesion or rash.  Neurological:     General: No focal deficit present.     Mental Status: He is alert and oriented to person, place, and time. Mental status is at baseline.  Psychiatric:        Mood and Affect: Mood normal.        Behavior: Behavior normal.        Thought Content: Thought content normal.        Judgment: Judgment normal.     Results for orders placed or performed in visit on 08/03/22  Bayer DCA Hb A1c Waived  Result Value Ref Range   HB A1C (BAYER DCA - WAIVED) 9.6 (H) 4.8 - 5.6 %      Assessment & Plan:   Problem List Items Addressed This Visit       Cardiovascular and Mediastinum   Hypertension associated with diabetes ([place])    Doing well on the 5mg  amlodipine. Will keep him on that. Continue to monitor. Call with any concerns.       Relevant Medications   amLODipine (NORVASC) 5 MG tablet     Endocrine   Hyperlipidemia associated with type 2 diabetes mellitus ([place])    Did not start his XR metformin. Will start it continue glipizide. Recheck 2 months with A1c. Call with any concerns.       Relevant Medications   amLODipine (NORVASC) 5 MG tablet     Other   Severe recurrent major depression ([place])    Did not restart his cymbalta. Restart. Recheck 2 months. Call with any concerns.       Chronic pain    Encouraged him to start his cymbalta 20mg . Recheck 2 months. Call with any concerns.       Other Visit Diagnoses     Need for influenza vaccination    -  Primary   Relevant Orders   Flu Vaccine QUAD 6+ mos PF IM (Fluarix Quad PF) (Completed)        Follow up plan: Return in about 2 months (around  11/08/2022).

## 2022-09-08 NOTE — Assessment & Plan Note (Signed)
Encouraged him to start his cymbalta 20mg . Recheck 2 months. Call with any concerns.

## 2022-09-11 ENCOUNTER — Telehealth: Payer: Self-pay

## 2022-09-11 NOTE — Telephone Encounter (Unsigned)
Copied from [place]. Topic: General - Inquiry >> Sep 11, 2022  2:46 PM Rosanne Ashing P wrote: Reason for CRM: Pt called saying he was in the office Tuesday and he was thing Dr. Wynetta Emery wanted him to change the metformin that he is taking to a extended release metformin.  He is confused and would like  a nurse to call him back.  CB@   [phone]

## 2022-09-14 NOTE — Telephone Encounter (Signed)
Called pharmacy and requested correct medication to be filled, per pharmacy will have that ready for patient and notify when ready.

## 2022-09-14 NOTE — Telephone Encounter (Signed)
Returned patient call to confirm he should be on Metformin XR. Per patient, pharmacy filled old prescription of Metformin. Will call pharmacy to have them fill correct medication.

## 2022-09-19 DIAGNOSIS — E785 Hyperlipidemia, unspecified: Secondary | ICD-10-CM

## 2022-09-19 DIAGNOSIS — Z794 Long term (current) use of insulin: Secondary | ICD-10-CM | POA: Diagnosis not present

## 2022-09-19 DIAGNOSIS — I110 Hypertensive heart disease with heart failure: Secondary | ICD-10-CM

## 2022-09-19 DIAGNOSIS — E1159 Type 2 diabetes mellitus with other circulatory complications: Secondary | ICD-10-CM | POA: Diagnosis not present

## 2022-11-09 ENCOUNTER — Ambulatory Visit (INDEPENDENT_AMBULATORY_CARE_PROVIDER_SITE_OTHER): Payer: Medicare HMO | Admitting: Family Medicine

## 2022-11-09 ENCOUNTER — Encounter: Payer: Self-pay | Admitting: Family Medicine

## 2022-11-09 VITALS — BP 118/65 | HR 63 | Temp 99.0°F | Ht 69.0 in | Wt 184.7 lb

## 2022-11-09 DIAGNOSIS — E785 Hyperlipidemia, unspecified: Secondary | ICD-10-CM

## 2022-11-09 DIAGNOSIS — E1169 Type 2 diabetes mellitus with other specified complication: Secondary | ICD-10-CM | POA: Diagnosis not present

## 2022-11-09 DIAGNOSIS — G894 Chronic pain syndrome: Secondary | ICD-10-CM

## 2022-11-09 DIAGNOSIS — E1165 Type 2 diabetes mellitus with hyperglycemia: Secondary | ICD-10-CM

## 2022-11-09 DIAGNOSIS — I110 Hypertensive heart disease with heart failure: Secondary | ICD-10-CM

## 2022-11-09 DIAGNOSIS — E782 Mixed hyperlipidemia: Secondary | ICD-10-CM | POA: Diagnosis not present

## 2022-11-09 LAB — BAYER DCA HB A1C WAIVED: HB A1C (BAYER DCA - WAIVED): 8 % — ABNORMAL HIGH (ref 4.8–5.6)

## 2022-11-09 MED ORDER — DULOXETINE HCL 20 MG PO CPEP: 20.0000 mg | ORAL_CAPSULE | Freq: Every day | ORAL | 3 refills | Status: DC

## 2022-11-09 MED ORDER — LISINOPRIL 40 MG PO TABS
40.0000 mg | ORAL_TABLET | Freq: Every day | ORAL | 1 refills | Status: DC
Start: 2022-11-09 — End: 2023-02-02

## 2022-11-09 MED ORDER — LOVASTATIN 40 MG PO TABS: 40.0000 mg | ORAL_TABLET | Freq: Every day | ORAL | 1 refills | Status: DC

## 2022-11-09 MED ORDER — AMLODIPINE BESYLATE 5 MG PO TABS: 5.0000 mg | ORAL_TABLET | Freq: Every day | ORAL | 1 refills | Status: DC

## 2022-11-09 MED ORDER — METOPROLOL TARTRATE 50 MG PO TABS
50.0000 mg | ORAL_TABLET | Freq: Every day | ORAL | 1 refills | Status: DC
Start: 2022-11-09 — End: 2023-02-02

## 2022-11-09 NOTE — Progress Notes (Signed)
BP 118/65 (BP Location: Right Arm, Cuff Size: Normal)   Pulse 63   Temp 99 F (37.2 C) (Oral)   Ht 5\' 9"  (1.753 m)   Wt 184 lb 11.2 oz (83.8 kg)   SpO2 99%   BMI 27.28 kg/m    Subjective:    Patient ID: Lucas Weiss, male    DOB: 11/13/1956, 66 y.o.   MRN: [id number]  HPI: Lucas Weiss is a 66 y.o. male  Chief Complaint  Patient presents with   Hyperlipidemia   Hypertension   Pain   Diabetes    Patient declines having a recent Diabetic Eye Exam.    DIABETES Hypoglycemic episodes:no Polydipsia/polyuria: no Visual disturbance: no Chest pain: no Paresthesias: no Glucose Monitoring: no  Accucheck frequency: Not Checking Taking Insulin?: no Blood Pressure Monitoring: not checking Retinal Examination: Not up to Date Foot Exam: Up to Date Diabetic Education: Completed Pneumovax: Up to Date Influenza: Up to Date Aspirin: yes  HYPERTENSION / HYPERLIPIDEMIA Satisfied with current treatment? yes Duration of hypertension: chronic BP monitoring frequency: not checking BP medication side effects: no Past BP meds: lisinopril, metoprolol, amlodipine Duration of hyperlipidemia: chronic Cholesterol medication side effects: no Cholesterol supplements: none Past cholesterol medications: lovastatin Medication compliance: excellent compliance Aspirin: no Recent stressors: no Recurrent headaches: no Visual changes: no Palpitations: no Dyspnea: no Chest pain: no Lower extremity edema: no Dizzy/lightheaded: no  NEUROPATHY Neuropathy status: uncontrolled  Satisfied with current treatment?: no Medication side effects: not taking medicine Medication compliance:  poor compliance Location: low back and legs Pain: no Severity: moderate  Quality:  shooting  Frequency: constant Bilateral: yes Symmetric: yes Numbness: yes Decreased sensation: yes Weakness: yes Context: stable  Relevant past medical, surgical, family and social history reviewed and updated as  indicated. Interim medical history since our last visit reviewed. Allergies and medications reviewed and updated.  Review of Systems  Constitutional: Negative.   Respiratory: Negative.    Cardiovascular: Negative.   Gastrointestinal: Negative.   Musculoskeletal:  Positive for back pain and myalgias. Negative for arthralgias, gait problem, joint swelling, neck pain and neck stiffness.  Skin: Negative.   Neurological:  Positive for weakness and numbness. Negative for dizziness, tremors, seizures, syncope, facial asymmetry, speech difficulty, light-headedness and headaches.  Psychiatric/Behavioral: Negative.      Per HPI unless specifically indicated above     Objective:    BP 118/65 (BP Location: Right Arm, Cuff Size: Normal)   Pulse 63   Temp 99 F (37.2 C) (Oral)   Ht 5\' 9"  (1.753 m)   Wt 184 lb 11.2 oz (83.8 kg)   SpO2 99%   BMI 27.28 kg/m   Wt Readings from Last 3 Encounters:  11/09/22 184 lb 11.2 oz (83.8 kg)  09/08/22 183 lb 1.6 oz (83.1 kg)  09/02/22 185 lb (83.9 kg)    Physical Exam Vitals and nursing note reviewed.  Constitutional:      General: He is not in acute distress.    Appearance: Normal appearance. He is not ill-appearing, toxic-appearing or diaphoretic.  HENT:     Head: Normocephalic and atraumatic.     Right Ear: External ear normal.     Left Ear: External ear normal.     Nose: Nose normal.     Mouth/Throat:     Mouth: Mucous membranes are moist.     Pharynx: Oropharynx is clear.  Eyes:     General: No scleral icterus.       Right eye: No discharge.  Left eye: No discharge.     Extraocular Movements: Extraocular movements intact.     Conjunctiva/sclera: Conjunctivae normal.     Pupils: Pupils are equal, round, and reactive to light.  Cardiovascular:     Rate and Rhythm: Normal rate and regular rhythm.     Pulses: Normal pulses.     Heart sounds: Normal heart sounds. No murmur heard.    No friction rub. No gallop.  Pulmonary:      Effort: Pulmonary effort is normal. No respiratory distress.     Breath sounds: Normal breath sounds. No stridor. No wheezing, rhonchi or rales.  Chest:     Chest wall: No tenderness.  Musculoskeletal:        General: Normal range of motion.     Cervical back: Normal range of motion and neck supple.  Skin:    General: Skin is warm and dry.     Capillary Refill: Capillary refill takes less than 2 seconds.     Coloration: Skin is not jaundiced or pale.     Findings: No bruising, erythema, lesion or rash.  Neurological:     General: No focal deficit present.     Mental Status: He is alert and oriented to person, place, and time. Mental status is at baseline.  Psychiatric:        Mood and Affect: Mood normal.        Behavior: Behavior normal.        Thought Content: Thought content normal.        Judgment: Judgment normal.     Results for orders placed or performed in visit on 11/09/22  Bayer DCA Hb A1c Waived  Result Value Ref Range   HB A1C (BAYER DCA - WAIVED) 8.0 (H) 4.8 - 5.6 %      Assessment & Plan:   Problem List Items Addressed This Visit       Cardiovascular and Mediastinum   Hypertensive heart failure ([place])    Under good control on current regimen. Continue current regimen. Continue to monitor. Call with any concerns. Refills given. Labs drawn today.        Relevant Medications   amLODipine (NORVASC) 5 MG tablet   lisinopril (ZESTRIL) 40 MG tablet   lovastatin (MEVACOR) 40 MG tablet   metoprolol tartrate (LOPRESSOR) 50 MG tablet     Endocrine   Hyperlipidemia associated with type 2 diabetes mellitus ([place])    Under good control on current regimen. Continue current regimen. Continue to monitor. Call with any concerns. Refills given. Labs drawn today.       Relevant Medications   amLODipine (NORVASC) 5 MG tablet   lisinopril (ZESTRIL) 40 MG tablet   lovastatin (MEVACOR) 40 MG tablet   metoprolol tartrate (LOPRESSOR) 50 MG tablet   Controlled type 2 diabetes  mellitus ([place]) - Primary    Doing well with A1c of 8.0 down from 9.6. Will continue current regimen. Call with any concerns. Continue to monitor.       Relevant Medications   lisinopril (ZESTRIL) 40 MG tablet   lovastatin (MEVACOR) 40 MG tablet   Other Relevant Orders   Bayer DCA Hb A1c Waived (Completed)   Ambulatory referral to Ophthalmology     Other   Chronic pain    Did not take his cymbalta. Encouraged him to take his cymbalta. Recheck 3 months. Call with any concerns.       Relevant Medications   DULoxetine (CYMBALTA) 20 MG capsule   Other Visit Diagnoses  Mixed hyperlipidemia       Relevant Medications   amLODipine (NORVASC) 5 MG tablet   lisinopril (ZESTRIL) 40 MG tablet   lovastatin (MEVACOR) 40 MG tablet   metoprolol tartrate (LOPRESSOR) 50 MG tablet        Follow up plan: Return in about 3 months (around 02/09/2023).

## 2022-11-09 NOTE — Assessment & Plan Note (Signed)
Under good control on current regimen. Continue current regimen. Continue to monitor. Call with any concerns. Refills given. Labs drawn today.   

## 2022-11-09 NOTE — Assessment & Plan Note (Signed)
Doing well with A1c of 8.0 down from 9.6. Will continue current regimen. Call with any concerns. Continue to monitor.

## 2022-11-09 NOTE — Assessment & Plan Note (Signed)
Did not take his cymbalta. Encouraged him to take his cymbalta. Recheck 3 months. Call with any concerns.

## 2022-11-10 ENCOUNTER — Telehealth: Payer: Self-pay | Admitting: Family Medicine

## 2022-11-10 NOTE — Telephone Encounter (Signed)
Copied from CRM [id number]. Topic: Referral - Status >> Nov 10, 2022 10:16 AM [name] wrote: Lucas Weiss with [facility] stated that patient refused to schedule initial appointment.

## 2022-11-26 ENCOUNTER — Telehealth: Payer: Self-pay

## 2022-11-26 NOTE — Progress Notes (Signed)
  Chronic Care Management Pharmacy Assistant   Name: Lucas Weiss  MRN: [id number] DOB: 06/29/1956   Reason for Encounter: Disease State   Conditions to be addressed/monitored: DMII   Recent office visits:  11/09/22 Johnson, Megan P, DO (Diabetes) Orders placed: A1c, ambulatory referral to ophthalmology; Medication changes: Duloxetine 20 mg  09/08/22 Johnson, Megan P, DO (Diabetes) Orders placed: A1c, ambulatory referral to ophthalmology; Medication changes: Duloxetine 20 mg  Recent consult visits:  None since last coordination call  Hospital visits:  None in previous 6 months  Medications: Outpatient Encounter Medications as of 11/26/2022  Medication Sig   amLODipine (NORVASC) 5 MG tablet Take 1 tablet (5 mg total) by mouth daily.   aspirin EC 81 MG tablet Take 81 mg by mouth daily. (Patient not taking: Reported on 11/09/2022)   Blood Glucose Monitoring Suppl (ONETOUCH VERIO) w/Device KIT To check blood sugar twice a day and document, bring to provider visits.   DULoxetine (CYMBALTA) 20 MG capsule Take 1 capsule (20 mg total) by mouth daily. FOR YOUR NERVE PAIN   glipiZIDE (GLUCOTROL) 5 MG tablet Take 1 tablet (5 mg total) by mouth 2 (two) times daily before a meal.   glucose blood (ONETOUCH VERIO) test strip To check blood sugar twice a day and document, bring to provider visits.   Lancets (ONETOUCH ULTRASOFT) lancets To check blood sugar twice a day and document, bring to provider visits.   lisinopril (ZESTRIL) 40 MG tablet Take 1 tablet (40 mg total) by mouth daily.   lovastatin (MEVACOR) 40 MG tablet Take 1 tablet (40 mg total) by mouth at bedtime.   metFORMIN (GLUCOPHAGE-XR) 500 MG 24 hr tablet Take 1 tablet (500 mg total) by mouth daily with breakfast.   metoprolol tartrate (LOPRESSOR) 50 MG tablet Take 1 tablet (50 mg total) by mouth daily.   No facility-administered encounter medications on file as of 11/26/2022.   Recent Relevant Labs: Lab Results  Component Value  Date/Time   HGBA1C 8.0 (H) 11/09/2022 03:15 PM   HGBA1C 9.6 (H) 08/03/2022 03:47 PM   MICROALBUR 150 (H) 01/30/2022 02:57 PM   MICROALBUR 150 (H) 10/08/2020 03:01 PM    Kidney Function Lab Results  Component Value Date/Time   CREATININE 1.69 (H) 02/27/2022 03:07 PM   CREATININE 1.25 01/30/2022 03:00 PM   CREATININE 1.10 06/02/2012 08:35 AM   GFRNONAA 70 10/08/2020 03:05 PM   GFRNONAA >60 06/02/2012 08:35 AM   GFRAA 81 10/08/2020 03:05 PM   GFRAA >60 06/02/2012 08:35 AM    Current antihyperglycemic regimen:  Glipizide 5 mg 1 tab twice daily Metformin 500 mg 1 tab daily  What recent interventions/DTPs have been made to improve glycemic control:  Continue to current regime, per Megan Johnson. Note 11/09/22  Have there been any recent hospitalizations or ED visits since last visit with CPP? No  Patient denies hypoglycemic symptoms, including None  Patient denies hyperglycemic symptoms, including none  How often are you checking your blood sugar? Patient states that he does not check daily, only when he does not feel good. Patient states that he will check BS for next 2 wks, will call him on 12/23/22 to get readings.  What are your blood sugars ranging? NA  During the week, how often does your blood glucose drop below 70? Never  Are you checking your feet daily/regularly? Patient states he really not having issues with feet.  Adherence Review: Is the patient currently on a STATIN medication? Yes Is the patient currently on   ACE/ARB medication? No Does the patient have >5 day gap between last estimated fill dates? No   Care Gaps: Colonoscopy-NA Diabetic Foot Exam-01/30/22 Ophthalmology-NA Dexa Scan - NA Annual Well Visit - 07/15/22 Micro albumin-01/30/22 Hemoglobin A1c- 11/09/22 (8.0), 08/03/22 (9.6)  Star Rating Drugs: Glipizide 5 mg-last fill 11/01/22 90 ds, 08/03/22 90 ds Lisinopril 40 mg-last fill 09/01/22 90 ds, 05/22/22 100 ds Lovastatin 40 mg-last fill 11/09/22 90 ds,  07/15/22 90 ds Metformin 500 mg-last fill 09/14/22 90 ds, 07/29/22 90 ds  Tracy Ellis,CMA Health Concierge [phone]  

## 2022-12-28 ENCOUNTER — Telehealth: Payer: Medicare HMO

## 2023-02-02 ENCOUNTER — Ambulatory Visit (INDEPENDENT_AMBULATORY_CARE_PROVIDER_SITE_OTHER): Payer: Medicare HMO | Admitting: Family Medicine

## 2023-02-02 ENCOUNTER — Encounter: Payer: Self-pay | Admitting: Family Medicine

## 2023-02-02 VITALS — BP 123/76 | HR 71 | Temp 98.3°F | Ht 69.0 in | Wt 184.4 lb

## 2023-02-02 DIAGNOSIS — E1169 Type 2 diabetes mellitus with other specified complication: Secondary | ICD-10-CM | POA: Diagnosis not present

## 2023-02-02 DIAGNOSIS — E1165 Type 2 diabetes mellitus with hyperglycemia: Secondary | ICD-10-CM

## 2023-02-02 DIAGNOSIS — E782 Mixed hyperlipidemia: Secondary | ICD-10-CM | POA: Diagnosis not present

## 2023-02-02 DIAGNOSIS — I110 Hypertensive heart disease with heart failure: Secondary | ICD-10-CM | POA: Diagnosis not present

## 2023-02-02 DIAGNOSIS — E785 Hyperlipidemia, unspecified: Secondary | ICD-10-CM | POA: Diagnosis not present

## 2023-02-02 MED ORDER — AMLODIPINE BESYLATE 5 MG PO TABS: 5.0000 mg | ORAL_TABLET | Freq: Every day | ORAL | 1 refills | Status: DC

## 2023-02-02 MED ORDER — METOPROLOL TARTRATE 50 MG PO TABS: 50.0000 mg | ORAL_TABLET | Freq: Every day | ORAL | 1 refills | Status: DC

## 2023-02-02 MED ORDER — LISINOPRIL 40 MG PO TABS: 40.0000 mg | ORAL_TABLET | Freq: Every day | ORAL | 1 refills | Status: DC

## 2023-02-02 MED ORDER — METFORMIN HCL ER 500 MG PO TB24: 500.0000 mg | ORAL_TABLET | Freq: Every day | ORAL | 1 refills | Status: DC

## 2023-02-02 MED ORDER — LOVASTATIN 40 MG PO TABS: 40.0000 mg | ORAL_TABLET | Freq: Every day | ORAL | 1 refills | Status: DC

## 2023-02-02 MED ORDER — GLIPIZIDE 5 MG PO TABS: 5.0000 mg | ORAL_TABLET | Freq: Two times a day (BID) | ORAL | 1 refills | Status: DC

## 2023-02-02 NOTE — Progress Notes (Signed)
BP 123/76   Pulse 71   Temp 98.3 F (36.8 C) (Oral)   Ht 5' 9"$  (1.753 m)   Wt 184 lb 6.4 oz (83.6 kg)   SpO2 98%   BMI 27.23 kg/m    Subjective:    Patient ID: Lucas Weiss, male    DOB: 1956-12-07, 67 y.o.   MRN: [id number]  HPI: Lucas Weiss is a 67 y.o. male  Chief Complaint  Patient presents with   Diabetes   Hypertension   DIABETES Hypoglycemic episodes:yes Polydipsia/polyuria: no Visual disturbance: no Chest pain: no Paresthesias: no Glucose Monitoring: yes  Accucheck frequency: Not Checking Taking Insulin?: no Blood Pressure Monitoring: not checking Retinal Examination: Up to Date Foot Exam: Up to Date Diabetic Education: Completed Pneumovax: Up to Date Influenza: Up to Date Aspirin: no  HYPERTENSION / HYPERLIPIDEMIA Satisfied with current treatment? yes Duration of hypertension: chronic BP monitoring frequency: not checking BP medication side effects: no Past BP meds: lisinopril, metoprolol, amlodipine Duration of hyperlipidemia: chronic Cholesterol medication side effects: no Cholesterol supplements: none Past cholesterol medications: lovastatin Medication compliance: excellent compliance Aspirin: yes Recent stressors: no Recurrent headaches: no Visual changes: no Palpitations: no Dyspnea: no Chest pain: no Lower extremity edema: no Dizzy/lightheaded: no  Relevant past medical, surgical, family and social history reviewed and updated as indicated. Interim medical history since our last visit reviewed. Allergies and medications reviewed and updated.  Review of Systems  Constitutional: Negative.   Respiratory: Negative.    Cardiovascular: Negative.   Gastrointestinal: Negative.   Musculoskeletal:  Positive for arthralgias and myalgias. Negative for back pain, gait problem, joint swelling, neck pain and neck stiffness.  Skin: Negative.   Psychiatric/Behavioral: Negative.      Per HPI unless specifically indicated above      Objective:    BP 123/76   Pulse 71   Temp 98.3 F (36.8 C) (Oral)   Ht 5' 9"$  (1.753 m)   Wt 184 lb 6.4 oz (83.6 kg)   SpO2 98%   BMI 27.23 kg/m   Wt Readings from Last 3 Encounters:  02/02/23 184 lb 6.4 oz (83.6 kg)  11/09/22 184 lb 11.2 oz (83.8 kg)  09/08/22 183 lb 1.6 oz (83.1 kg)    Physical Exam Vitals and nursing note reviewed.  Constitutional:      General: He is not in acute distress.    Appearance: Normal appearance. He is normal weight. He is not ill-appearing, toxic-appearing or diaphoretic.  HENT:     Head: Normocephalic and atraumatic.     Right Ear: External ear normal.     Left Ear: External ear normal.     Nose: Nose normal.     Mouth/Throat:     Mouth: Mucous membranes are moist.     Pharynx: Oropharynx is clear.  Eyes:     General: No scleral icterus.       Right eye: No discharge.        Left eye: No discharge.     Extraocular Movements: Extraocular movements intact.     Conjunctiva/sclera: Conjunctivae normal.     Pupils: Pupils are equal, round, and reactive to light.  Cardiovascular:     Rate and Rhythm: Normal rate and regular rhythm.     Pulses: Normal pulses.     Heart sounds: Normal heart sounds. No murmur heard.    No friction rub. No gallop.  Pulmonary:     Effort: Pulmonary effort is normal. No respiratory distress.     Breath  sounds: Normal breath sounds. No stridor. No wheezing, rhonchi or rales.  Chest:     Chest wall: No tenderness.  Musculoskeletal:        General: Normal range of motion.     Cervical back: Normal range of motion and neck supple.  Skin:    General: Skin is warm and dry.     Capillary Refill: Capillary refill takes less than 2 seconds.     Coloration: Skin is not jaundiced or pale.     Findings: No bruising, erythema, lesion or rash.  Neurological:     General: No focal deficit present.     Mental Status: He is alert and oriented to person, place, and time. Mental status is at baseline.  Psychiatric:         Mood and Affect: Mood normal.        Behavior: Behavior normal.        Thought Content: Thought content normal.        Judgment: Judgment normal.     Results for orders placed or performed in visit on 11/09/22  Bayer DCA Hb A1c Waived  Result Value Ref Range   HB A1C (BAYER DCA - WAIVED) 8.0 (H) 4.8 - 5.6 %      Assessment & Plan:   Problem List Items Addressed This Visit       Cardiovascular and Mediastinum   Hypertensive heart failure ([place])    Under good control on current regimen. Continue current regimen. Continue to monitor. Call with any concerns. Refills given. Labs drawn today.        Relevant Medications   amLODipine (NORVASC) 5 MG tablet   lisinopril (ZESTRIL) 40 MG tablet   lovastatin (MEVACOR) 40 MG tablet   metoprolol tartrate (LOPRESSOR) 50 MG tablet   Other Relevant Orders   Urine Microalbumin w/creat. ratio   Comprehensive metabolic panel     Endocrine   Hyperlipidemia associated with type 2 diabetes mellitus ([place])    Under good control on current regimen. Continue current regimen. Continue to monitor. Call with any concerns. Refills given. Labs drawn today.       Relevant Medications   amLODipine (NORVASC) 5 MG tablet   glipiZIDE (GLUCOTROL) 5 MG tablet   lisinopril (ZESTRIL) 40 MG tablet   lovastatin (MEVACOR) 40 MG tablet   metFORMIN (GLUCOPHAGE-XR) 500 MG 24 hr tablet   metoprolol tartrate (LOPRESSOR) 50 MG tablet   Controlled type 2 diabetes mellitus ([place]) - Primary    Will recheck labs. Await results. Treat as needed.       Relevant Medications   glipiZIDE (GLUCOTROL) 5 MG tablet   lisinopril (ZESTRIL) 40 MG tablet   lovastatin (MEVACOR) 40 MG tablet   metFORMIN (GLUCOPHAGE-XR) 500 MG 24 hr tablet   Other Relevant Orders   Hgb A1c w/o eAG   Ambulatory referral to Ophthalmology   Other Visit Diagnoses     Mixed hyperlipidemia       Relevant Medications   amLODipine (NORVASC) 5 MG tablet   lisinopril (ZESTRIL) 40 MG tablet    lovastatin (MEVACOR) 40 MG tablet   metoprolol tartrate (LOPRESSOR) 50 MG tablet   Other Relevant Orders   Comprehensive metabolic panel   Lipid Panel w/o Chol/HDL Ratio        Follow up plan: Return in about 3 months (around 05/03/2023).

## 2023-02-02 NOTE — Assessment & Plan Note (Signed)
Will recheck labs. Await results. Treat as needed.

## 2023-02-02 NOTE — Assessment & Plan Note (Signed)
Under good control on current regimen. Continue current regimen. Continue to monitor. Call with any concerns. Refills given. Labs drawn today.   

## 2023-02-03 ENCOUNTER — Other Ambulatory Visit: Payer: Self-pay | Admitting: Family Medicine

## 2023-02-03 LAB — COMPREHENSIVE METABOLIC PANEL
ALT: 29 IU/L (ref 0–44)
AST: 21 IU/L (ref 0–40)
Albumin/Globulin Ratio: 1.6 (ref 1.2–2.2)
Albumin: 4.2 g/dL (ref 3.9–4.9)
Alkaline Phosphatase: 118 IU/L (ref 44–121)
BUN/Creatinine Ratio: 17 (ref 10–24)
BUN: 30 mg/dL — ABNORMAL HIGH (ref 8–27)
Bilirubin Total: 0.3 mg/dL (ref 0.0–1.2)
CO2: 17 mmol/L — ABNORMAL LOW (ref 20–29)
Calcium: 9 mg/dL (ref 8.6–10.2)
Chloride: 104 mmol/L (ref 96–106)
Creatinine, Ser: 1.8 mg/dL — ABNORMAL HIGH (ref 0.76–1.27)
Globulin, Total: 2.6 g/dL (ref 1.5–4.5)
Glucose: 316 mg/dL — ABNORMAL HIGH (ref 70–99)
Potassium: 5.1 mmol/L (ref 3.5–5.2)
Sodium: 137 mmol/L (ref 134–144)
Total Protein: 6.8 g/dL (ref 6.0–8.5)
eGFR: 42 mL/min/{1.73_m2} — ABNORMAL LOW (ref >59)

## 2023-02-03 LAB — LIPID PANEL W/O CHOL/HDL RATIO
Cholesterol, Total: 237 mg/dL — ABNORMAL HIGH (ref 100–199)
HDL: 32 mg/dL — ABNORMAL LOW (ref >39)
LDL Chol Calc (NIH): 91 mg/dL (ref 0–99)
Triglycerides: 682 mg/dL (ref 0–149)
VLDL Cholesterol Cal: 114 mg/dL — ABNORMAL HIGH (ref 5–40)

## 2023-02-03 LAB — HGB A1C W/O EAG: Hgb A1c MFr Bld: 10.3 % — ABNORMAL HIGH (ref 4.8–5.6)

## 2023-02-03 LAB — MICROALBUMIN / CREATININE URINE RATIO
Creatinine, Urine: 134.5 mg/dL
Microalb/Creat Ratio: 1212 mg/g creat — ABNORMAL HIGH (ref 0–29)
Microalbumin, Urine: 1629.6 ug/mL

## 2023-02-03 NOTE — Telephone Encounter (Signed)
Unable to refill per protocol, Rx request is too soon. Last refill 02/02/23.  Requested Prescriptions  Pending Prescriptions Disp Refills   glipiZIDE (GLUCOTROL) 5 MG tablet [Pharmacy Med Name: glipiZIDE 5 MG TABLET] 180 tablet 1    Sig: TAKE 1 TABLET BY MOUTH TWICE A DAY BEFORE A MEAL     Endocrinology:  Diabetes - Sulfonylureas Failed - 02/03/2023  6:21 AM      Failed - HBA1C is between 0 and 7.9 and within 180 days    HB A1C (BAYER DCA - WAIVED)  Date Value Ref Range Status  11/09/2022 8.0 (H) 4.8 - 5.6 % Final    Comment:             Prediabetes: 5.7 - 6.4          Diabetes: >6.4          Glycemic control for adults with diabetes: <7.0    Hgb A1c MFr Bld  Date Value Ref Range Status  02/02/2023 10.3 (H) 4.8 - 5.6 % Final    Comment:             Prediabetes: 5.7 - 6.4          Diabetes: >6.4          Glycemic control for adults with diabetes: <7.0          Failed - Cr in normal range and within 360 days    Creatinine  Date Value Ref Range Status  06/02/2012 1.10 0.60 - 1.30 mg/dL Final   Creatinine, Ser  Date Value Ref Range Status  02/02/2023 1.80 (H) 0.76 - 1.27 mg/dL Final         Passed - Valid encounter within last 6 months    Recent Outpatient Visits           Yesterday Controlled type 2 diabetes mellitus with hyperglycemia, without long-term current use of insulin ([place])   [place], Megan P, DO   2 months ago Controlled type 2 diabetes mellitus with hyperglycemia, without long-term current use of insulin ([place])   [place], Megan P, DO   4 months ago Need for influenza vaccination   [place] [facility] [place], Megan P, DO   6 months ago Controlled type 2 diabetes mellitus with hyperglycemia, without long-term current use of insulin ([place])   [place], Megan P, DO   8 months ago Severe episode of recurrent major depressive disorder, without  psychotic features [facility])   [place], Barb Merino, DO       Future Appointments             In 3 months Johnson, Barb Merino, DO [place]

## 2023-02-04 ENCOUNTER — Other Ambulatory Visit: Payer: Self-pay | Admitting: Family Medicine

## 2023-02-04 MED ORDER — EMPAGLIFLOZIN 25 MG PO TABS: 25.0000 mg | ORAL_TABLET | Freq: Every day | ORAL | 2 refills | Status: DC

## 2023-02-09 ENCOUNTER — Ambulatory Visit: Payer: Medicare HMO | Admitting: Family Medicine

## 2023-02-10 ENCOUNTER — Telehealth: Payer: Self-pay

## 2023-02-16 ENCOUNTER — Telehealth: Payer: Self-pay

## 2023-05-04 ENCOUNTER — Encounter: Payer: Self-pay | Admitting: Family Medicine

## 2023-05-04 ENCOUNTER — Ambulatory Visit (INDEPENDENT_AMBULATORY_CARE_PROVIDER_SITE_OTHER): Payer: Medicare HMO | Admitting: Family Medicine

## 2023-05-04 VITALS — BP 129/77 | HR 63 | Temp 98.1°F | Wt 184.5 lb

## 2023-05-04 DIAGNOSIS — Z23 Encounter for immunization: Secondary | ICD-10-CM | POA: Diagnosis not present

## 2023-05-04 DIAGNOSIS — E1165 Type 2 diabetes mellitus with hyperglycemia: Secondary | ICD-10-CM | POA: Diagnosis not present

## 2023-05-04 DIAGNOSIS — Z7984 Long term (current) use of oral hypoglycemic drugs: Secondary | ICD-10-CM

## 2023-05-04 DIAGNOSIS — W57XXXA Bitten or stung by nonvenomous insect and other nonvenomous arthropods, initial encounter: Secondary | ICD-10-CM

## 2023-05-04 LAB — BAYER DCA HB A1C WAIVED: HB A1C (BAYER DCA - WAIVED): 9.9 % — ABNORMAL HIGH (ref 4.8–5.6)

## 2023-05-04 MED ORDER — DOXYCYCLINE HYCLATE 100 MG PO TABS: 100.0000 mg | ORAL_TABLET | Freq: Two times a day (BID) | ORAL | 0 refills | Status: DC

## 2023-05-04 NOTE — Assessment & Plan Note (Signed)
Sugars improved to 9.9 from 10.3, but still not good. Will get local pharmacy involved as patient is visually impaired and cannot fill his paperwork out on his own. Goal of starting rybelsus and/or jardiance and recheck in 3 months. Call with any concerns.

## 2023-05-04 NOTE — Progress Notes (Signed)
BP 129/77   Pulse 63   Temp 98.1 F (36.7 C) (Oral)   Wt 184 lb 8 oz (83.7 kg)   SpO2 98%   BMI 27.25 kg/m    Subjective:    Patient ID: Lucas Weiss, male    DOB: 04-Nov-1956, 67 y.o.   MRN: [id number]  HPI: Lucas Weiss is a 67 y.o. male  Chief Complaint  Patient presents with   Diabetes   DIABETES Hypoglycemic episodes:no Polydipsia/polyuria: yes Visual disturbance: yes Chest pain: no Paresthesias: no Glucose Monitoring: no  Accucheck frequency: Not Checking Taking Insulin?: no Blood Pressure Monitoring: not checking Retinal Examination: Up to Date Foot Exam: Up to Date Diabetic Education: Completed Pneumovax: Up to Date Influenza: Up to Date Aspirin: yes  Relevant past medical, surgical, family and social history reviewed and updated as indicated. Interim medical history since our last visit reviewed. Allergies and medications reviewed and updated.  Review of Systems  Constitutional: Negative.   Respiratory: Negative.    Cardiovascular: Negative.   Gastrointestinal: Negative.   Musculoskeletal: Negative.   Neurological: Negative.   Psychiatric/Behavioral: Negative.      Per HPI unless specifically indicated above     Objective:    BP 129/77   Pulse 63   Temp 98.1 F (36.7 C) (Oral)   Wt 184 lb 8 oz (83.7 kg)   SpO2 98%   BMI 27.25 kg/m   Wt Readings from Last 3 Encounters:  05/04/23 184 lb 8 oz (83.7 kg)  02/02/23 184 lb 6.4 oz (83.6 kg)  11/09/22 184 lb 11.2 oz (83.8 kg)    Physical Exam Vitals and nursing note reviewed.  Constitutional:      General: He is not in acute distress.    Appearance: Normal appearance. He is not ill-appearing, toxic-appearing or diaphoretic.  HENT:     Head: Normocephalic and atraumatic.     Right Ear: External ear normal.     Left Ear: External ear normal.     Nose: Nose normal.     Mouth/Throat:     Mouth: Mucous membranes are moist.     Pharynx: Oropharynx is clear.  Eyes:     General: No scleral  icterus.       Right eye: No discharge.        Left eye: No discharge.     Extraocular Movements: Extraocular movements intact.     Conjunctiva/sclera: Conjunctivae normal.     Pupils: Pupils are equal, round, and reactive to light.  Cardiovascular:     Rate and Rhythm: Normal rate and regular rhythm.     Pulses: Normal pulses.     Heart sounds: Normal heart sounds. No murmur heard.    No friction rub. No gallop.  Pulmonary:     Effort: Pulmonary effort is normal. No respiratory distress.     Breath sounds: Normal breath sounds. No stridor. No wheezing, rhonchi or rales.  Chest:     Chest wall: No tenderness.  Musculoskeletal:        General: Normal range of motion.     Cervical back: Normal range of motion and neck supple.  Skin:    General: Skin is warm and dry.     Capillary Refill: Capillary refill takes less than 2 seconds.     Coloration: Skin is not jaundiced or pale.     Findings: No bruising, erythema, lesion or rash.  Neurological:     General: No focal deficit present.     Mental Status:  He is alert and oriented to person, place, and time. Mental status is at baseline.  Psychiatric:        Mood and Affect: Mood normal.        Behavior: Behavior normal.        Thought Content: Thought content normal.        Judgment: Judgment normal.     Results for orders placed or performed in visit on 02/02/23  Urine Microalbumin w/creat. ratio  Result Value Ref Range   Creatinine, Urine 134.5 Not Estab. mg/dL   Microalbumin, Urine 4,098.1 Not Estab. ug/mL   Microalb/Creat Ratio 1,212 (H) 0 - 29 mg/g creat  Comprehensive metabolic panel  Result Value Ref Range   Glucose 316 (H) 70 - 99 mg/dL   BUN 30 (H) 8 - 27 mg/dL   Creatinine, Ser 1.91 (H) 0.76 - 1.27 mg/dL   eGFR 42 (L) >47 WG/NFA/2.13   BUN/Creatinine Ratio 17 10 - 24   Sodium 137 134 - 144 mmol/L   Potassium 5.1 3.5 - 5.2 mmol/L   Chloride 104 96 - 106 mmol/L   CO2 17 (L) 20 - 29 mmol/L   Calcium 9.0 8.6 - 10.2  mg/dL   Total Protein 6.8 6.0 - 8.5 g/dL   Albumin 4.2 3.9 - 4.9 g/dL   Globulin, Total 2.6 1.5 - 4.5 g/dL   Albumin/Globulin Ratio 1.6 1.2 - 2.2   Bilirubin Total 0.3 0.0 - 1.2 mg/dL   Alkaline Phosphatase 118 44 - 121 IU/L   AST 21 0 - 40 IU/L   ALT 29 0 - 44 IU/L  Hgb A1c w/o eAG  Result Value Ref Range   Hgb A1c MFr Bld 10.3 (H) 4.8 - 5.6 %  Lipid Panel w/o Chol/HDL Ratio  Result Value Ref Range   Cholesterol, Total 237 (H) 100 - 199 mg/dL   Triglycerides 086 (HH) 0 - 149 mg/dL   HDL 32 (L) >57 mg/dL   VLDL Cholesterol Cal 114 (H) 5 - 40 mg/dL   LDL Chol Calc (NIH) 91 0 - 99 mg/dL      Assessment & Plan:   Problem List Items Addressed This Visit       Endocrine   Uncontrolled type 2 diabetes mellitus with hyperglycemia ([facility]) - Primary    Sugars improved to 9.9 from 10.3, but still not good. Will get local pharmacy involved as patient is visually impaired and cannot fill his paperwork out on his own. Goal of starting rybelsus and/or jardiance and recheck in 3 months. Call with any concerns.       Relevant Orders   Bayer DCA Hb A1c Waived   AMB Referral to Pharmacy Medication Management   Pneumococcal conjugate vaccine 20-valent (Prevnar 20)   Other Visit Diagnoses     Tick bite, unspecified site, initial encounter       Will check labs and treat with doxycycline. Call with any concerns.   Relevant Orders   Lyme Disease Serology w/Reflex   Spotted Fever Group Antibodies   Ehrlichia Antibody Panel        Follow up plan: Return in about 3 months (around 08/04/2023).

## 2023-05-05 ENCOUNTER — Telehealth: Payer: Self-pay

## 2023-05-05 ENCOUNTER — Encounter: Payer: Self-pay | Admitting: Family Medicine

## 2023-05-05 NOTE — Progress Notes (Signed)
   Care Guide Note  05/05/2023 Name: Lucas Weiss MRN: [id number] DOB: 12-16-1956  Referred by: Dorcas Carrow, DO Reason for referral : Care Coordination (Outreach to schedule with pharm d )   Lucas Weiss is a 67 y.o. year old male who is a primary care patient of Dorcas Carrow, DO. Lucas Weiss was referred to the pharmacist for assistance related to DM.    Successful contact was made with the patient to discuss pharmacy services including being ready for the pharmacist to call at least 5 minutes before the scheduled appointment time, to have medication bottles and any blood sugar or blood pressure readings ready for review. The patient agreed to meet with the pharmacist via with the pharmacist via telephone visit on (date/time).  05/07/2023  Penne Lash, RMA Care Guide [facility]  [place], [place] [address] Direct Dial: [phone] [email or web address]

## 2023-05-07 ENCOUNTER — Other Ambulatory Visit: Payer: Medicare HMO

## 2023-05-07 NOTE — Progress Notes (Signed)
05/07/2023 Name: Lucas Weiss MRN: [id number] DOB: 1956/11/15  Chief Complaint  Patient presents with   Medication Assistance   Lucas Weiss is a 67 y.o. year old male who presented for a telephone visit.   They were referred to the pharmacist by their PCP for assistance in managing medication access.   Patient is participating in a Managed Medicaid Plan:  No  Subjective: Telephone visit to discuss affordability of medications after referral was received by PCP, Dr. Laural Benes.  Care Team: Primary Care Provider: Dorcas Carrow, DO ; Next Scheduled Visit: 08/05/23  Medication Access/Adherence Current Pharmacy:  [id number] [place], [place] - [address] [place] [address] Phone: [phone] Fax: [phone]  -Patient reports affordability concerns with their medications: Yes  -Patient reports access/transportation concerns to their pharmacy: Yes  -Patient reports adherence concerns with their medications:  No    Diabetes: Current medications: glipizide 5mg  BID, metformin XR 500mg  daily -Patient would benefit from and Dr. Laural Benes would like to initiate Rybelsus and/or Jardiance but cannot afford -Not checking home BG on regular basis -Patient endorses affordability concerns with other resources and inquiring about monthly financial supplemenation  Objective: Lab Results  Component Value Date   HGBA1C 9.9 (H) 05/04/2023   Lab Results  Component Value Date   CREATININE 1.80 (H) 02/02/2023   BUN 30 (H) 02/02/2023   NA 137 02/02/2023   K 5.1 02/02/2023   CL 104 02/02/2023   CO2 17 (L) 02/02/2023   Lab Results  Component Value Date   CHOL 237 (H) 02/02/2023   HDL 32 (L) 02/02/2023   LDLCALC 91 02/02/2023   TRIG 682 (HH) 02/02/2023   CHOLHDL 6.3 07/17/2011   Medications Reviewed Today     Reviewed by Dorcas Carrow, DO (Physician) on 05/04/23 at 1532  Med List Status: <None>   Medication Order Taking? Sig  Documenting Provider Last Dose Status Informant  amLODipine (NORVASC) 5 MG tablet [id number] Yes Take 1 tablet (5 mg total) by mouth daily. Olevia Perches P, DO Taking Active   aspirin EC 81 MG tablet [id number] Yes Take 81 mg by mouth daily. [provider] Taking Active   Blood Glucose Monitoring Suppl (ONETOUCH VERIO) w/Device KIT [id number] Yes To check blood sugar twice a day and document, bring to provider visits. Aura Dials T, NP Taking Active   doxycycline (VIBRA-TABS) 100 MG tablet [id number] Yes Take 1 tablet (100 mg total) by mouth 2 (two) times daily. [place], Megan P, DO  Active   glipiZIDE (GLUCOTROL) 5 MG tablet [id number] Yes Take 1 tablet (5 mg total) by mouth 2 (two) times daily before a meal. Laural Benes, Megan P, DO Taking Active   glucose blood (ONETOUCH VERIO) test strip [id number] Yes To check blood sugar twice a day and document, bring to provider visits. Marjie Skiff, NP Taking Active   Lancets [facility] ULTRASOFT) lancets [id number] Yes To check blood sugar twice a day and document, bring to provider visits. Aura Dials T, NP Taking Active   lisinopril (ZESTRIL) 40 MG tablet [id number] Yes Take 1 tablet (40 mg total) by mouth daily. Laural Benes, Megan P, DO Taking Active   lovastatin (MEVACOR) 40 MG tablet [id number] Yes Take 1 tablet (40 mg total) by mouth at bedtime. Johnson, Megan P, DO Taking Active   metFORMIN (GLUCOPHAGE-XR) 500 MG 24 hr tablet [id number] Yes Take 1 tablet (500 mg total) by mouth daily with breakfast. Dorcas Carrow, DO Taking Active  metoprolol tartrate (LOPRESSOR) 50 MG tablet [id number] Yes Take 1 tablet (50 mg total) by mouth daily. Johnson, Megan P, DO Taking Active            Assessment/Plan:   Diabetes: - Currently uncontrolled - Recommend to check glucose daily and record - Assessed eligibility for Medicare Extrahelp to assist with all healthcare and medication costs, but patient does not qualify based on resources (retirement,  savings, etc) - Patient would qualify for PAP for Rybelsus and Jardiance.  Contacting medication assistance team; they are typically able to complete Rybelsus application on patient's behalf.  Usually Jardiance requires patient signature and proof of income.  Will see if there is another option based on patient's inability to see/read -Forwarding chart to case worker to see if there are additional resources to assist with other needs  Follow Up Plan: Will follow-up on PAP status and inform patient/provider  Lenna Gilford, PharmD, DPLA

## 2023-05-10 ENCOUNTER — Other Ambulatory Visit (HOSPITAL_COMMUNITY): Payer: Self-pay

## 2023-05-10 ENCOUNTER — Telehealth: Payer: Self-pay

## 2023-05-10 ENCOUNTER — Telehealth: Payer: Self-pay | Admitting: *Deleted

## 2023-05-10 NOTE — Telephone Encounter (Signed)
-----   Message from Lenna Gilford, [facility] sent at 05/10/2023 11:05 AM EDT ----- Thank you- I will see if I can complete LIS application for him or have social work assist with that.  Just let me know about the Rybelsus! ----- Message ----- From: Otho Najjar, CPhT Sent: 05/10/2023  10:53 AM EDT To: Lenna Gilford, RPH  I will attempt the enrollment online for Rybelsus. As for [facility]) he will need to apply for LIS and be denied first. Then we can move forward with the application once he receives the denial letter. I can also call him to see if signing on his behalf for the [facility] Cares application would be ok.  ----- Message ----- From: Lenna Gilford, [facility] Sent: 05/07/2023   5:44 PM EDT To: Rx Med Assistance Team  Good evening,  This patient would qualify for Rybelsus and Jardiance PAP, BUT he is unable to see/read out of one eye and is unable to complete forms.  I know sometimes you can submit through [employer] on patient's behalf; what about BI for Jardiance?  Thank you!

## 2023-05-10 NOTE — Progress Notes (Signed)
  Care Coordination   Note   05/10/2023 Name: Lucas Weiss MRN: [id number] DOB: December 21, 1956  Lucas Weiss is a 67 y.o. year old male who sees Dorcas Carrow, DO for primary care. I reached out to [name] by phone today to offer care coordination services.  [name] was given information about Care Coordination services today including:   The Care Coordination services include support from the care team which includes your Nurse Coordinator, Clinical Social Worker, or Pharmacist.  The Care Coordination team is here to help remove barriers to the health concerns and goals most important to you. Care Coordination services are voluntary, and the patient may decline or stop services at any time by request to their care team member.   Care Coordination Consent Status: Patient agreed to services and verbal consent obtained.   Follow up plan:  Telephone appointment with care coordination team member scheduled for:  05/13/2023 and 05/24/2023  Encounter Outcome:  Pt. Scheduled  Burman Nieves, CCMA Care Coordination Care Guide Direct Dial: [phone]

## 2023-05-10 NOTE — Telephone Encounter (Signed)
Correction, will call pt for form completion authorization.

## 2023-05-10 NOTE — Telephone Encounter (Signed)
Attempted to enroll patient with [employer] via online portal.   Unsuccessful. Will mail application to pt's home.

## 2023-05-11 LAB — EHRLICHIA ANTIBODY PANEL
E. Chaffeensis (HME) IgM Titer: NEGATIVE
E.Chaffeensis (HME) IgG: NEGATIVE
HGE IgG Titer: NEGATIVE
HGE IgM Titer: NEGATIVE

## 2023-05-11 LAB — SPOTTED FEVER GROUP ANTIBODIES
Spotted Fever Group IgG: <1:64 {titer}
Spotted Fever Group IgM: <1:64 {titer}

## 2023-05-11 LAB — LYME DISEASE SEROLOGY W/REFLEX: Lyme Total Antibody EIA: NEGATIVE

## 2023-05-12 NOTE — Telephone Encounter (Signed)
Correction!!  Was able to submit [employer] nordisk application online.   Submitted application for RYBELSUS 3MG /7MG  to [employer] for patient assistance via online portal.   Phone: [phone]

## 2023-05-13 ENCOUNTER — Ambulatory Visit: Payer: Self-pay

## 2023-05-13 NOTE — Patient Instructions (Signed)
Visit Information  Thank you for taking time to visit with me today. Please don't hesitate to contact me if I can be of assistance to you.   Following are the goals we discussed today:   Goals Addressed             This Visit's Progress    Access better insurance and utilitize current benefits       -Patient wants better insurance coverage -Patient will use OTC benefits with current policy -Patient will contact food resources in the community        Our next appointment is by telephone on 05/19/23 at 10am  Please call the care guide team at [phone] if you need to cancel or reschedule your appointment.   If you are experiencing a Mental Health or Behavioral Health Crisis or need someone to talk to, please call 911  Patient verbalizes understanding of instructions and care plan provided today and agrees to view in MyChart. Active MyChart status and patient understanding of how to access instructions and care plan via MyChart confirmed with patient.     Telephone follow up appointment with care management team member scheduled for:05/19/23/ at 10am    Lysle Morales, BSW Social Worker [facility] Care Management  [phone]

## 2023-05-13 NOTE — Patient Outreach (Signed)
  Care Coordination   Initial Visit Note   05/13/2023 Name: Lucas Weiss MRN: [id number] DOB: [birth year]  Lucas Weiss is a 67 y.o. year old male who sees Dorcas Carrow, DO for primary care. I spoke with  [name] by phone today.  What matters to the patients health and wellness today?  Patient feels that his current plan with [employer] is not best for him and wants to change plans.  He has dental but was told he has to pay $1200 deductible first.  He pays $300 per month for the coverage and wants to reduce the cost.      Goals Addressed             This Visit's Progress    Access better insurance and utilitize current benefits       -Patient wants better insurance coverage -Patient will use OTC benefits with current policy -Patient will contact food resources in the community        SDOH assessments and interventions completed:  Yes  SDOH Interventions Today    Flowsheet Row Most Recent Value  SDOH Interventions   Food Insecurity Interventions Other (Comment)  [need food resources]  Housing Interventions Intervention Not Indicated  Utilities Interventions Intervention Not Indicated        Care Coordination Interventions:  Yes, provided  -SW recommends Representative Payee to assist with managing finances due to previous scammers accessing his account and due to his cognitive issues but he declines at this time and feels that granddaughter will continue to help when needed. -Sw agreed to provide a list of food resources in the community -SW t/c [employer] and discovered client only has access to [facility]75 [facility]TC [facility] or by calling [phone].  Follow up plan: Follow up call scheduled for 05/19/23/ at 10am    Encounter Outcome:  Pt. Visit Completed

## 2023-05-14 ENCOUNTER — Telehealth: Payer: Self-pay

## 2023-05-14 NOTE — Progress Notes (Signed)
   05/14/2023  Patient ID: Lucas Weiss, male   DOB: 08/13/1956, 67 y.o.   MRN: [id number]  Patient outreach to inform him Ozempic PAP application has been submitted by medication assistance team on his behalf.  Contacted [employer] to check on status of PAP application, and they are missing production selection and provider signature/date for the application.  Will coordinate with medication assistance team and Dr. Laural Benes to complete this.  Holding off on pursuing Jardiance PAP at this time due to requirement to apply to and be denied LIS.  Patient endorses he may qualify for LIS in the near future; and he could then get Jardiance for $11/month if needed.  Would suggest titrating metformin XR dose in an effort to get him on 1,000mg  BID at least until we can titrate Rybelsus up and/or get Jardiance on board.  Unable to reach patient of leave a message; will attempt again next week.

## 2023-05-18 NOTE — Telephone Encounter (Signed)
Received notification from [employer] regarding approval for RYBELSUS 3MG  & 7MG . Patient assistance approved until 12/21/23.  Medication will ship to office in 7-14 business days  Phone: [phone]

## 2023-05-19 ENCOUNTER — Ambulatory Visit: Payer: Self-pay

## 2023-05-19 NOTE — Patient Instructions (Signed)
Visit Information  Thank you for taking time to visit with me today. Please don't hesitate to contact me if I can be of assistance to you.   Following are the goals we discussed today:   Goals Addressed             This Visit's Progress    Access better insurance and utilitize current benefits       -Patient wants better insurance coverage -Patient will use OTC benefits with current policy -Patient will contact food resources in the community   05/19/23 -Patient will contact [employer] or [employer] for changes to his Medicare coverage -Patient will contact [employer] to select OTC items        Our next appointment is by telephone on 06/02/23 at 11am  Please call the care guide team at [phone] if you need to cancel or reschedule your appointment.   If you are experiencing a Mental Health or Behavioral Health Crisis or need someone to talk to, please call 911  Patient verbalizes understanding of instructions and care plan provided today and agrees to view in MyChart. Active MyChart status and patient understanding of how to access instructions and care plan via MyChart confirmed with patient.     Telephone follow up appointment with care management team member scheduled for:06/02/23 at 11am  Lysle Morales, BSW Social Worker [facility] Care Management  [phone]

## 2023-05-19 NOTE — Patient Outreach (Signed)
  Care Coordination   Follow Up Visit Note   05/19/2023 Name: Lucas Weiss MRN: [id number] DOB: 29-Nov-1956  Lucas Weiss is a 67 y.o. year old male who sees Lucas Carrow, DO for primary care. I spoke with  Lucas Weiss by phone today.  What matters to the patients health and wellness today?  Patient has not followed up with [employer] on the $75 OTC benefit.   Plans to contact [facility] today and [employer] by the end of the week.  Patient is thankful for the follow up.   Goals Addressed             This Visit's Progress    Access better insurance and utilitize current benefits       -Patient wants better insurance coverage -Patient will use OTC benefits with current policy -Patient will contact food resources in the community   05/19/23 -Patient will contact [employer] or [employer] for changes to his Medicare coverage -Patient will contact [employer] to select OTC items        SDOH assessments and interventions completed:  No     Care Coordination Interventions:  Yes, provided  Interventions Today    Flowsheet Row Most Recent Value  General Interventions   General Interventions Discussed/Reviewed General Interventions Discussed, General Interventions Reviewed, Community Resources  [Provided [employer] and [employer] options and [employer] OTC benefit option]       Follow up plan: Follow up call scheduled for 06/02/23 at 11am    Encounter Outcome:  Pt. Visit Completed

## 2023-05-21 ENCOUNTER — Telehealth: Payer: Self-pay

## 2023-05-21 NOTE — Progress Notes (Signed)
   05/21/2023  Patient ID: Lucas Weiss, male   DOB: 1956-06-01, 67 y.o.   MRN: [id number]  Subjective/Objective:  DM -Contacted patient to inform Rybelsus 3mg  and 7mg  PAP approved 5/28 -Endorses current stomach pain possibly related to doxycycline  -Patient asked about stopping metformin, but I encouraged to keep metformin and glipizide on board while we initiate Rybelsus until we can get A1c under better control  Assessment/Plan:  DM -Educated patient on administration- 3mg  first before starting 7mg  -Instructed patient to contact Dr. Henriette Combs office if stomach pain has not improved by beginning of next week  Follow-up:  Will follow-up with patient in 4-6 weeks to see how he is tolerating Rybelsus  Lenna Gilford, PharmD, DPLA

## 2023-05-24 ENCOUNTER — Ambulatory Visit: Payer: Self-pay | Admitting: *Deleted

## 2023-05-24 NOTE — Patient Outreach (Signed)
  Care Coordination   Follow Up Visit Note   05/24/2023 Name: Lucas Weiss MRN: [id number] DOB: 08-24-1956  Lucas Weiss is a 67 y.o. year old male who sees Dorcas Carrow, DO for primary care. I spoke with  Lucas Weiss by phone today.  What matters to the patients health and wellness today?  Patient report he does not have time to talk, will call back later.      SDOH assessments and interventions completed:  No     Care Coordination Interventions:  No, not indicated   Follow up plan:  Patient will call back.  If no call back will ask care guide to reschedule    Encounter Outcome:  Pt. Request to Call Back   Kemper Durie, RN, MSN, [facility] Care Management Care Management Coordinator [phone]

## 2023-06-01 ENCOUNTER — Telehealth: Payer: Self-pay

## 2023-06-01 NOTE — Telephone Encounter (Signed)
1 box of Rybelsus received for the patient. Called and notified patient that medication was ready to be picked up.

## 2023-06-01 NOTE — Telephone Encounter (Signed)
Medication picked up by the patient.  

## 2023-06-02 ENCOUNTER — Ambulatory Visit: Payer: Self-pay

## 2023-06-02 NOTE — Patient Outreach (Signed)
  Care Coordination   Follow Up Visit Note   06/02/2023 Name: Lucas Weiss MRN: [id number] DOB: 1956-08-09  Lucas Weiss is a 67 y.o. year old male who sees Dorcas Carrow, DO for primary care. I spoke with  Lucas Weiss by phone today.  What matters to the patients health and wellness today?  Patient is uncertain if he call [employer] or [employer].  Patient will call [employer] to discuss changing his Medicare plan.  Patient was told by [facility] that he did not qualify for the OTC benefit.  SW reminded client of the conference call with [employer] that patient is eligible and was directed to [facility] in [facility].      Goals Addressed             This Visit's Progress    Access better insurance and utilitize current benefits       -Patient wants better insurance coverage -Patient will use OTC benefits with current policy -Patient will contact food resources in the community   05/19/23 -Patient will contact [employer] or [employer] for changes to his Medicare coverage -Patient will contact [employer] to select OTC items  06/02/23 -Patient will contact [employer] or [employer] this week to make changes to his Medicare coverage -[facility] and contact a neighbor who works at [facility] to use the $75 Quarterly OTC benefit        SDOH assessments and interventions completed:  Yes    Interventions Today    Flowsheet Row Most Recent Value  General Interventions   General Interventions Discussed/Reviewed General Interventions Reviewed  [SW provided number to [facility], [employer].  SW reminded [facility] to use $75 quarterly OTC benefit from [employer]]        Care Coordination Interventions:  Yes, provided   Care Coordination Interventions: SW provided contact number to [facility] and [employer] to change medicare plan  SW encouraged [facility] to [facility] to use the OTC benefit. Follow up plan: Follow up call scheduled for 06/15/23 at 11am     Encounter Outcome:  Pt. Visit Completed

## 2023-06-02 NOTE — Patient Instructions (Signed)
Visit Information  Thank you for taking time to visit with me today. Please don't hesitate to contact me if I can be of assistance to you.   Following are the goals we discussed today:   Goals Addressed             This Visit's Progress    Access better insurance and utilitize current benefits       -Patient wants better insurance coverage -Patient will use OTC benefits with current policy -Patient will contact food resources in the community   05/19/23 -Patient will contact [employer] or [employer] for changes to his Medicare coverage -Patient will contact [employer] to select OTC items  06/02/23 -Patient will contact [employer] or [employer] this week to make changes to his Medicare coverage -[facility] and contact a neighbor who works at [facility] to use the $75 Quarterly OTC benefit        Our next appointment is by telephone on 06/15/23 at 11:00am  Please call the care guide team at [phone] if you need to cancel or reschedule your appointment.   If you are experiencing a Mental Health or Behavioral Health Crisis or need someone to talk to, please call 911  Patient verbalizes understanding of instructions and care plan provided today and agrees to view in MyChart. Active MyChart status and patient understanding of how to access instructions and care plan via MyChart confirmed with patient.     Telephone follow up appointment with care management team member scheduled for:06/15/23 at 11:00am  Lysle Morales, BSW Social Worker [facility] Care Management  [phone]

## 2023-06-03 ENCOUNTER — Ambulatory Visit (INDEPENDENT_AMBULATORY_CARE_PROVIDER_SITE_OTHER): Payer: Medicare HMO | Admitting: Physician Assistant

## 2023-06-03 ENCOUNTER — Encounter: Payer: Self-pay | Admitting: Physician Assistant

## 2023-06-03 VITALS — BP 142/71 | HR 72 | Temp 98.1°F | Ht 69.02 in | Wt 182.2 lb

## 2023-06-03 DIAGNOSIS — R1012 Left upper quadrant pain: Secondary | ICD-10-CM

## 2023-06-03 LAB — URINALYSIS, ROUTINE W REFLEX MICROSCOPIC
Bilirubin, UA: NEGATIVE
Ketones, UA: NEGATIVE
Leukocytes,UA: NEGATIVE
Nitrite, UA: NEGATIVE
Specific Gravity, UA: >1.03 — ABNORMAL HIGH (ref 1.005–1.030)
Urobilinogen, Ur: 1 mg/dL (ref 0.2–1.0)
pH, UA: 5.5 (ref 5.0–7.5)

## 2023-06-03 LAB — MICROSCOPIC EXAMINATION
Bacteria, UA: NONE SEEN
WBC, UA: NONE SEEN /hpf (ref 0–5)

## 2023-06-03 NOTE — Patient Instructions (Addendum)
  Make sure you are staying well hydrated  Avoid holding your urine for prolonged periods of time  I recommend eating a bland diet to reduce your nausea and GI upset   We will keep you updated with the results of your labs  You can take Tylenol for pain management. You should not use NSAIDs for pain as this can impact your kidneys  If your pain gets worse, you start noticing blood in your urine or bowel movements, fevers, chills, nausea and vomiting, weakness - please go to the ED

## 2023-06-03 NOTE — Progress Notes (Signed)
Acute Office Visit   Patient: Lucas Weiss   DOB: 03-Oct-1956   67 y.o. Male  MRN: [id number] Visit Date: 06/03/2023  Today's healthcare provider: Oswaldo Conroy Machaela Caterino, PA-C  Introduced myself to the patient as a Secondary school teacher and provided education on APPs in clinical practice.    Chief Complaint  Patient presents with   Flank Pain    Left side pain x 2 weeks   Subjective    HPI HPI     Flank Pain    Additional comments: Left side pain x 2 weeks      Last edited by Sherolyn Buba, CMA on 06/03/2023  1:17 PM.        Flank Pain   Onset: gradual  Duration: 2 weeks  Location: LLQ pain  Radiation: none to his knowledge   Pain level and character:  he reports pain is sharp and feels like needles, 7/10  Other associated symptoms: he denies pain with urination or with bowel movements  He reports tick bites last month - was treated with abx, antibodies negative for tick-borne illness  Finished this on 05/25/23   He reports some nausea, subjective fever   Interventions: nothing   Alleviating: he tried stopping eating red meat  Aggravating: eating red meat       Medications: Outpatient Medications Prior to Visit  Medication Sig   amLODipine (NORVASC) 5 MG tablet Take 1 tablet (5 mg total) by mouth daily.   aspirin EC 81 MG tablet Take 81 mg by mouth daily.   Blood Glucose Monitoring Suppl (ONETOUCH VERIO) w/Device KIT To check blood sugar twice a day and document, bring to provider visits.   doxycycline (VIBRA-TABS) 100 MG tablet Take 1 tablet (100 mg total) by mouth 2 (two) times daily.   glipiZIDE (GLUCOTROL) 5 MG tablet Take 1 tablet (5 mg total) by mouth 2 (two) times daily before a meal.   glucose blood (ONETOUCH VERIO) test strip To check blood sugar twice a day and document, bring to provider visits.   Lancets (ONETOUCH ULTRASOFT) lancets To check blood sugar twice a day and document, bring to provider visits.   lisinopril (ZESTRIL) 40 MG tablet Take 1 tablet (40  mg total) by mouth daily.   lovastatin (MEVACOR) 40 MG tablet Take 1 tablet (40 mg total) by mouth at bedtime.   metFORMIN (GLUCOPHAGE-XR) 500 MG 24 hr tablet Take 1 tablet (500 mg total) by mouth daily with breakfast.   metoprolol tartrate (LOPRESSOR) 50 MG tablet Take 1 tablet (50 mg total) by mouth daily.   No facility-administered medications prior to visit.    Review of Systems  Constitutional:  Negative for chills, fatigue and fever.  Gastrointestinal:  Positive for abdominal pain. Negative for constipation, diarrhea, nausea, rectal pain and vomiting.  Genitourinary:  Negative for dysuria, flank pain, hematuria, penile pain, penile swelling, scrotal swelling and testicular pain.  Neurological:  Negative for dizziness and headaches.       Objective    BP (!) 142/71   Pulse 72   Temp 98.1 F (36.7 C) (Oral)   Ht 5' 9.02" (1.753 m)   Wt 182 lb 3.2 oz (82.6 kg)   SpO2 98%   BMI 26.89 kg/m    Physical Exam Vitals reviewed.  Constitutional:      General: He is awake.     Appearance: Normal appearance. He is well-developed and well-groomed.  HENT:     Head: Normocephalic and atraumatic.  Cardiovascular:     Rate and Rhythm: Normal rate and regular rhythm.     Heart sounds: Normal heart sounds. No murmur heard.    No friction rub. No gallop.  Pulmonary:     Effort: Pulmonary effort is normal.     Breath sounds: Normal breath sounds. No decreased air movement. No decreased breath sounds, wheezing, rhonchi or rales.  Abdominal:     General: Abdomen is flat. Bowel sounds are normal.     Palpations: Abdomen is soft.     Tenderness: There is abdominal tenderness in the right lower quadrant, suprapubic area, left upper quadrant and left lower quadrant. There is left CVA tenderness and guarding. There is no right CVA tenderness.  Genitourinary:    Comments: Patient denies concerns for genital area today. Exam deferred after shared-decision making.   Neurological:     Mental  Status: He is alert.  Psychiatric:        Behavior: Behavior is cooperative.       No results found for any visits on 06/03/23.  Assessment & Plan      No follow-ups on file.      Problem List Items Addressed This Visit   None Visit Diagnoses     Left upper quadrant abdominal pain    -  Primary Acute, new concern Patient reports left sided abdominal pain for the past [redacted] weeks along with nausea and subjective fevers Denies changes to bowel habits or urinary symptoms  Reviewed imaging results but there does not appear to be previous abdominal scans - cannot determine if there is historical intrabdominal process such as diverticulosis or kidney stones  Differential includes but is not limited to: UTI, nephrolithiasis, diverticulitis, muscle strain Will get CBC, CMP, UA for further evaluation Will also order CT stone study to check urinary tract and abdomen for acute process- imaging and lab results to dictate further management  Recommend Tylenol for pain management and bland diet for now Follow up as needed for persistent or progressing symptoms     Relevant Orders   Urinalysis, Routine w reflex microscopic   Comp Met (CMET)   CBC w/Diff   CT RENAL STONE STUDY        No follow-ups on file.   I, Milagro Belmares E Fatina Sprankle, PA-C, have reviewed all documentation for this visit. The documentation on 06/03/23 for the exam, diagnosis, procedures, and orders are all accurate and complete.   Jacquelin Hawking, MHS, PA-C [facility]

## 2023-06-04 LAB — CBC WITH DIFFERENTIAL/PLATELET
Basophils Absolute: 0.1 10*3/uL (ref 0.0–0.2)
Basos: 1 %
EOS (ABSOLUTE): 0.2 10*3/uL (ref 0.0–0.4)
Eos: 3 %
Hematocrit: 37.7 % (ref 37.5–51.0)
Hemoglobin: 12.4 g/dL — ABNORMAL LOW (ref 13.0–17.7)
Immature Grans (Abs): 0 10*3/uL (ref 0.0–0.1)
Immature Granulocytes: 0 %
Lymphocytes Absolute: 1.6 10*3/uL (ref 0.7–3.1)
Lymphs: 23 %
MCH: 28.3 pg (ref 26.6–33.0)
MCHC: 32.9 g/dL (ref 31.5–35.7)
MCV: 86 fL (ref 79–97)
Monocytes Absolute: 0.6 10*3/uL (ref 0.1–0.9)
Monocytes: 8 %
Neutrophils Absolute: 4.6 10*3/uL (ref 1.4–7.0)
Neutrophils: 65 %
Platelets: 184 10*3/uL (ref 150–450)
RBC: 4.38 x10E6/uL (ref 4.14–5.80)
RDW: 13.9 % (ref 11.6–15.4)
WBC: 7 10*3/uL (ref 3.4–10.8)

## 2023-06-04 LAB — COMPREHENSIVE METABOLIC PANEL
ALT: 29 IU/L (ref 0–44)
AST: 23 IU/L (ref 0–40)
Albumin/Globulin Ratio: 1.6
Albumin: 4.1 g/dL (ref 3.9–4.9)
Alkaline Phosphatase: 98 IU/L (ref 44–121)
BUN/Creatinine Ratio: 14 (ref 10–24)
BUN: 21 mg/dL (ref 8–27)
Bilirubin Total: 0.2 mg/dL (ref 0.0–1.2)
CO2: 18 mmol/L — ABNORMAL LOW (ref 20–29)
Calcium: 9 mg/dL (ref 8.6–10.2)
Chloride: 110 mmol/L — ABNORMAL HIGH (ref 96–106)
Creatinine, Ser: 1.53 mg/dL — ABNORMAL HIGH (ref 0.76–1.27)
Globulin, Total: 2.5 g/dL (ref 1.5–4.5)
Glucose: 249 mg/dL — ABNORMAL HIGH (ref 70–99)
Potassium: 4.5 mmol/L (ref 3.5–5.2)
Sodium: 142 mmol/L (ref 134–144)
Total Protein: 6.6 g/dL (ref 6.0–8.5)
eGFR: 50 mL/min/{1.73_m2} — ABNORMAL LOW (ref >59)

## 2023-06-07 ENCOUNTER — Telehealth: Payer: Self-pay | Admitting: Family Medicine

## 2023-06-07 NOTE — Telephone Encounter (Signed)
Copied from CRM [id number]. Topic: Referral - Status >> Jun 07, 2023 11:26 AM Carrielelia G wrote: Reason for CRM:  Please call pt Lucas Weiss on the status of his CT Referral  and the facility he needs to go to Please advise patient , thank you >> Jun 07, 2023 11:31 AM Carrielelia G wrote: PLease call regarding Lab results

## 2023-06-07 NOTE — Progress Notes (Signed)
Your labs are overall pretty normal and stable compared to previous results Your WBC were not elevated which is further confirmation that we are not likely to be dealing with a UTI but a kidney stone.  We will keep you updated once the CT results come back and if there is an indication that you should be seen by Urology.

## 2023-06-08 ENCOUNTER — Ambulatory Visit
Admission: RE | Admit: 2023-06-08 | Discharge: 2023-06-08 | Disposition: A | Discharge status: Home / Self Care | Payer: Medicare HMO | Source: Ambulatory Visit | Attending: Physician Assistant | Admitting: Physician Assistant

## 2023-06-08 DIAGNOSIS — R1012 Left upper quadrant pain: Secondary | ICD-10-CM | POA: Insufficient documentation

## 2023-06-08 DIAGNOSIS — K5730 Diverticulosis of large intestine without perforation or abscess without bleeding: Secondary | ICD-10-CM | POA: Diagnosis not present

## 2023-06-14 NOTE — Progress Notes (Signed)
Your CT scan results are back There does not appear to be a current kidney stone at this time but there are changes in your kidney that indicate you may have recently had one or a UTI. Since your urine did not show signs of an infection I think it is more likely that you have recently passed a kidney stone.  It appears that you have diverticulosis but it did not seem to be actively inflamed or irritated.  You have a gallstone in your gallbladder but it does not appear to be obstructing or causing issues right now. At this time there is no intervention indicated. Please let us know if you have further questions or concerns.

## 2023-06-15 ENCOUNTER — Ambulatory Visit: Payer: Self-pay

## 2023-06-15 ENCOUNTER — Telehealth: Payer: Self-pay | Admitting: Family Medicine

## 2023-06-15 NOTE — Telephone Encounter (Signed)
Pt. Given imaging results, verbalizes understanding. States he is still having left side abdominal pain."It's not as bad, but it is still there." Declines OV.

## 2023-06-15 NOTE — Telephone Encounter (Signed)
Reached patient he stated that he stopped the medication that it was feeling better.  Stated that he got the results and they said nothing was wrong and said that he would it a couple of days to think about it.  I told him to call him if he needed [place].

## 2023-06-15 NOTE — Patient Instructions (Signed)
Visit Information  Thank you for taking time to visit with me today. Please don't hesitate to contact me if I can be of assistance to you.   Following are the goals we discussed today:   Goals Addressed             This Visit's Progress    Access better insurance and utilitize current benefits       -Patient wants better insurance coverage -Patient will use OTC benefits with current policy -Patient will contact food resources in the community   05/19/23 -Patient will contact [employer] or [employer] for changes to his Medicare coverage -Patient will contact [employer] to select OTC items  06/02/23 -Patient will contact [employer] or [employer] this week to make changes to his Medicare coverage -[facility] and contact a [facility] who works [facility] to use the $75 Quarterly OTC benefit  06/15/23 -Patient will contact [employer] once he is feeling better (before open enrollment) -Patient will contact [facility] for OTC benefit with insurance once he is feeling better        If you are experiencing a Mental Health or Behavioral Health Crisis or need someone to talk to, please call 911  Patient verbalizes understanding of instructions and care plan provided today and agrees to view in MyChart. Active MyChart status and patient understanding of how to access instructions and care plan via MyChart confirmed with patient.     No further follow up required:    Lysle Morales, BSW Social Worker [facility] Care Management  [phone]

## 2023-06-15 NOTE — Patient Outreach (Signed)
  Care Coordination   Follow Up Visit Note   06/15/2023 Name: Lucas Weiss MRN: [id number] DOB: [birth year]  Lucas Weiss is a 67 y.o. year old male who sees Dorcas Carrow, DO for primary care. I spoke with  Lucas Weiss by phone today.  What matters to the patients health and wellness today?  Patient is having skin sensitivity and thinks it is related to his new medication.  SW suggested he called the doctor/nurse today.  He is waiting for results from the MRI regarding stomach pain.  Patient has not followed up with [facility] or [facility] but agreed to do so in the future when he feels better.  Patient is upset about issues with his neighbor that resulted in a call to police.  SW encouraged patient to avoid contact.     Goals Addressed             This Visit's Progress    Access better insurance and utilitize current benefits       -Patient wants better insurance coverage -Patient will use OTC benefits with current policy -Patient will contact food resources in the community   05/19/23 -Patient will contact [employer] or [employer] for changes to his Medicare coverage -Patient will contact [employer] to select OTC items  06/02/23 -Patient will contact [employer] or [employer] this week to make changes to his Medicare coverage -[facility] will [facility] and contact a neighbor who works at [facility] to use the $75 Quarterly OTC benefit  06/15/23 -Patient will contact [employer] once he is feeling better (before open enrollment) -Patient will contact [facility] for OTC benefit with insurance once he is feeling better        SDOH assessments and interventions completed:  Yes    Interventions Today    Flowsheet Row Most Recent Value  Chronic Disease   Chronic disease during today's visit Other  [New rx is causing skin to be sensitive, MRI for stomach pain]  General Interventions   General Interventions Discussed/Reviewed General Interventions Reviewed  [Patient has not followed thru with  contacting [facility] or [facility] but agreed to in the future]        Care Coordination Interventions:  Yes, provided   Care Coordination Interventions: SW encouraged patient to call [employer] prior to open enrollment to avoid long wait time and to decide in advance which plan to switch that might fit his needs SW encouraged patient to [facility] to use [facility]TC benefit  Active listening / Reflection utilized to reduce conflict with neighbor  Follow up plan: Weiss further intervention required.   Encounter Outcome:  Pt. Visit Completed

## 2023-06-15 NOTE — Telephone Encounter (Signed)
FYI to provider. Advised patient if he is still having issues or concerns, to please schedule an office visit to discuss.

## 2023-06-21 ENCOUNTER — Other Ambulatory Visit: Payer: Medicare HMO

## 2023-06-21 NOTE — Progress Notes (Signed)
   06/21/2023  Patient ID: Lucas Weiss, male   DOB: [birth year], 66 y.o.   MRN: [id number]  S/O: Patient outreach to check on tolerance of Rybelsus and home BG  DM -Patient picked up Rybelsus 7mg  from PCP office (PAP supplied) -Took medication for approximately 3 weeks before stopping for 1 week due to skin irritation and stomach pain -These symptoms were not resolved in the absence of Rybelsus, so he resumed taking this morning and is down to 10 tablets remaining -Of note, PAP application was being done for 30 days of 3mg  and 30 days of 7mg ; but chart notes reflect only 1 box being received.  Therefore, patient started at 7mg  dose versus doing 1 month of 3mg , then going to 7mg  daily -He does not check his blood glucose at home, because it "never feels too high or low." -Still endorsing skin irritation/burning sensation and stomach pain that he now believes may be related to metformin -He did not take his metformin dose this morning and plans to hold for several days to see if skin and stomach irritation resolves  A/P:  DM -Contacting [facility] to verify only the 1 box of 7mg  Rybelsus was received -Contacted [facility], and the next refill for Rybelsus 7mg  is scheduled for today- it will take 7-10 days to arrive at PCP office -He is enrolled in auto fill for 7mg  dose; so if/when we increase to 14mg  daily, an order change will need to be faxed -Suggested that patient take FBG at least 3 times per week while not on metformin and taking Rybelsus 7mg ; so we can better assess blood glucose control -Educated that you often cannot feel when BG is high and even sometimes low  Follow-up: Next Tuesday to see if skin/GI symptoms are improving and check on home BG  Lenna Gilford, PharmD, DPLA

## 2023-06-29 ENCOUNTER — Other Ambulatory Visit: Payer: Medicare HMO

## 2023-06-29 DIAGNOSIS — E1165 Type 2 diabetes mellitus with hyperglycemia: Secondary | ICD-10-CM

## 2023-06-29 NOTE — Progress Notes (Unsigned)
   06/29/2023  Patient ID: Lucas Weiss, male   DOB: 08/26/1956, 67 y.o.   MRN: [id number]  S/O: Patient outreach to check on home BG and medication adherence  Diabetes Management Plan -Patient is currently taking Rybelsus 7mg  daily, glipizide 5mg  BID, and metformin XR 500mg  daily -Had recently stopped metformin because he believed this was causing skin irritation.  He did not notice a difference so has resumed taking XR 500mg  daily -Believes skin irritation may be linked to glipizide, and would like to hold therapy for a few days to assess -Receives Rybelsus through [facility] PAP and should be receiving soon.  He is currently down to 5 days left of medication. -Checking home BG daily with [employer] testing supplies.  FBG ranges 126-187, post-prandial readings >200 -Does endorse limitations in nutritious food choices based on affordability   A/P:  Diabetes Management Plan -Patient will hold glipizide for 5-7 days to see if there is any improvement in skin irritation.  Instructed patient to resume therapy if there is no improvement in 7 days.  I also recommended pay close attention to BG during this time. [facility], and his Rybelsus refill went into process 7/1, and it takes 10-14 business days from then to arrive at Dr. Isidore Moos.  Rybelsus should arrive at [facility] by the end of this week or beginning of next week. -Recommend a telephone visit with diabetes educator or dietician to educate patient in regard to diabetic diet.  Will consult Dr. Laural Benes to place referral if she agrees.  Follow-up: Telephone visit scheduled for 7/19  Lenna Gilford, PharmD, DPLA

## 2023-07-01 NOTE — Addendum Note (Signed)
Addended by: Dorcas Carrow on: 07/01/2023 10:19 AM   Modules accepted: Orders

## 2023-07-05 ENCOUNTER — Telehealth: Payer: Self-pay

## 2023-07-05 ENCOUNTER — Ambulatory Visit: Payer: Self-pay

## 2023-07-05 DIAGNOSIS — I110 Hypertensive heart disease with heart failure: Secondary | ICD-10-CM

## 2023-07-05 DIAGNOSIS — E1165 Type 2 diabetes mellitus with hyperglycemia: Secondary | ICD-10-CM

## 2023-07-05 NOTE — Chronic Care Management (AMB) (Signed)
Chronic Care Management   CC RN Visit Note  07/05/2023 Name: Lucas Weiss MRN: [id number] DOB: Apr 25, 1956  Subjective: Lucas Weiss is a 67 y.o. year old male who is a primary care patient of Dorcas Carrow, DO. The patient was referred to the Chronic Care Management team for assistance with care management needs subsequent to provider initiation of CCM services and plan of care.    Today's Visit:  Engaged with patient by telephone for initial visit.        Goals Addressed             This Visit's Progress    CCM Expected Outcome:  Monitor, Self-Manage and Reduce Symptoms of Diabetes       Current Barriers:  Knowledge Deficits related to the importance of checking blood sugars and recording and monitoring for spikes and normals in blood sugar readings and the importance of effective management of DM Care Coordination needs related to food resources for healthy eating  in a patient with DM and financial restrictions.  Chronic Disease Management support and education needs related to effective management of DM Financial Constraints.   Planned Interventions: Provided education to patient about basic DM disease process; Reviewed medications with patient and discussed importance of medication adherence;        Reviewed prescribed diet with patient heart healthy/ADA diet. Will send information by mail on heart healthy/ADA diet; Counseled on importance of regular laboratory monitoring as prescribed;        Discussed plans with patient for ongoing care management follow up and provided patient with direct contact information for care management team;      Provided patient with written educational materials related to hypo and hyperglycemia and importance of correct treatment;       Reviewed scheduled/upcoming provider appointments including: 08-05-2023 with pcp;         Advised patient, providing education and rationale, to check cbg once daily, when you have symptoms of low or high  blood sugar, and encouraged the patient to take his blood sugars on a more consistent basis  and record. The patient has not been taking his blood sugars every day. The patient said the highest he has seen is 300 and lowest 125. Encouraged the patient to take his blood sugars more frequently. Education provided. Will send information by mail to help with healthy eating.        call provider for findings outside established parameters;       Referral made to pharmacy team for assistance with ongoing support and education for effective management of DM and other chronic conditions;       Referral made to community resources care guide team for assistance with food resources for healthy eating and resources to help the patient with cost effective food options ;      Review of patient status, including review of consultants reports, relevant laboratory and other test results, and medications completed;       Advised patient to discuss changes in DM, medication changes, and other question/concerns related to managing DM with provider;      Screening for signs and symptoms of depression related to chronic disease state;        Assessed social determinant of health barriers;         Symptom Management: Take medications as prescribed   Attend all scheduled provider appointments Call provider office for new concerns or questions  call the Suicide and Crisis Lifeline: 988 call the [place] National  Suicide Prevention Lifeline: 323 229 3055 or TTY: (225)048-9704 TTY 972 470 4645) to talk to a trained counselor call 1-800-273-TALK (toll free, 24 hour hotline) if experiencing a Mental Health or Behavioral Health Crisis  check blood sugar at prescribed times: once daily, when you have symptoms of low or high blood sugar, and check blood sugars more consistently. The patient does not always check as he should  check feet daily for cuts, sores or redness trim toenails straight across fill half of plate with  vegetables limit fast food meals to no more than 1 per week prepare main meal at home 3 to 5 days each week read food labels for fat, fiber, carbohydrates and portion size keep feet up while sitting wash and dry feet carefully every day wear comfortable, cotton socks wear comfortable, well-fitting shoes  Follow Up Plan: Telephone follow up appointment with care management team member scheduled for: 07-22-2023 at 345 pm       CCM Expected Outcome:  Monitor, Self-Manage, and Reduce Symptoms of Hypertension       Current Barriers:  Care Coordination needs related to food resources and help with cost constraints of food in a patient with HTN and other chronic conditions Chronic Disease Management support and education needs related to effective management of HTN Financial Constraints.    BP Readings from Last 3 Encounters:  06/03/23 (!) 142/71  05/04/23 129/77  02/02/23 123/76    Planned Interventions: Evaluation of current treatment plan related to hypertension self management and patient's adherence to plan as established by provider;   Provided education to patient re: stroke prevention, s/s of heart attack and stroke; Reviewed prescribed diet heart healthy/ADA diet  Reviewed medications with patient and discussed importance of compliance;  Discussed plans with patient for ongoing care management follow up and provided patient with direct contact information for care management team; Advised patient, providing education and rationale, to monitor blood pressure daily and record, calling PCP for findings outside established parameters;  Reviewed scheduled/upcoming provider appointments including: 08-05-2023 with pcp Advised patient to discuss changes in HTN and heart health with provider; Provided education on prescribed diet heart healthy/ADA diet ;  Discussed complications of poorly controlled blood pressure such as heart disease, stroke, circulatory complications, vision complications,  kidney impairment, sexual dysfunction;  Screening for signs and symptoms of depression related to chronic disease state;  Assessed social determinant of health barriers;   Symptom Management: Take medications as prescribed   Attend all scheduled provider appointments Call provider office for new concerns or questions  call the Suicide and Crisis Lifeline: 988 call the [place] National Suicide Prevention Lifeline: (913)474-6471 or TTY: 3514747613 TTY 272-443-3919) to talk to a trained counselor call 1-800-273-TALK (toll free, 24 hour hotline) if experiencing a Mental Health or Behavioral Health Crisis  check blood pressure weekly learn about high blood pressure call doctor for signs and symptoms of high blood pressure develop an action plan for high blood pressure keep all doctor appointments take medications for blood pressure exactly as prescribed report new symptoms to your doctor  Follow Up Plan: Telephone follow up appointment with care management team member scheduled for: 07-22-2023 at 345 pm          Plan:Telephone follow up appointment with care management team member scheduled for:  07-22-2023 at 345 pm  Alto Denver RN, MSN, CCM RN Care Manager  Chronic Care Management Direct Number: [phone]

## 2023-07-05 NOTE — Telephone Encounter (Signed)
CC RN Visit Note   07-05-2023 Name: Lucas Weiss MRN: [id number]      DOB: 03/26/1956  Subjective: Lucas Weiss is a 67 y.o. year old male who is a primary care patient of Dorcas Carrow, DO. The patient was referred to the Chronic Care Management team for assistance with care management needs subsequent to provider initiation of CCM services and plan of care.      Today's Visit: Engaged with patient by telephone for initial visit.   SDOH Interventions Today    Flowsheet Row Most Recent Value  SDOH Interventions   Food Insecurity Interventions Other (Comment)  [the patient agrees to careguide referral for help with food resources]  Housing Interventions Intervention Not Indicated  Transportation Interventions Intervention Not Indicated  Utilities Interventions Intervention Not Indicated  Alcohol Usage Interventions Intervention Not Indicated (Score <7)  Financial Strain Interventions Other (Comment)  [pap for medications, Care guide referral for food resources and other help]  Physical Activity Interventions Other (Comments)  [no structured activity]  Stress Interventions Other (Comment)  [stressors due to financial strain and limited ability to buy healthy foods]  Social Connections Interventions Intervention Not Indicated  Health Literacy Interventions Other (Comment)  [the patient understands information, needs ongoing support and education for effective management of his health and well being]        Goals Addressed             This Visit's Progress    CCM Expected Outcome:  Monitor, Self-Manage and Reduce Symptoms of Diabetes       Current Barriers:  Knowledge Deficits related to the importance of checking blood sugars and recording and monitoring for spikes and normals in blood sugar readings and the importance of effective management of DM Care Coordination needs related to food resources for healthy eating  in a patient with DM and financial restrictions.  Chronic  Disease Management support and education needs related to effective management of DM Financial Constraints.   Planned Interventions: Provided education to patient about basic DM disease process; Reviewed medications with patient and discussed importance of medication adherence;        Reviewed prescribed diet with patient heart healthy/ADA diet. Will send information by mail on heart healthy/ADA diet; Counseled on importance of regular laboratory monitoring as prescribed;        Discussed plans with patient for ongoing care management follow up and provided patient with direct contact information for care management team;      Provided patient with written educational materials related to hypo and hyperglycemia and importance of correct treatment;       Reviewed scheduled/upcoming provider appointments including: 08-05-2023 with pcp;         Advised patient, providing education and rationale, to check cbg once daily, when you have symptoms of low or high blood sugar, and encouraged the patient to take his blood sugars on a more consistent basis  and record        call provider for findings outside established parameters;       Referral made to pharmacy team for assistance with ongoing support and education for effective management of DM and other chronic conditions;       Referral made to community resources care guide team for assistance with food resources for healthy eating and resources to help the patient with cost effective food options ;      Review of patient status, including review of consultants reports, relevant laboratory and other test results,  and medications completed;       Advised patient to discuss changes in DM, medication changes, and other question/concerns related to managing DM with provider;      Screening for signs and symptoms of depression related to chronic disease state;        Assessed social determinant of health barriers;         Symptom Management: Take  medications as prescribed   Attend all scheduled provider appointments Call provider office for new concerns or questions  call the Suicide and Crisis Lifeline: 988 call the [place] National Suicide Prevention Lifeline: (484)741-3415 or TTY: 701-331-4865 TTY 534-867-7423) to talk to a trained counselor call 1-800-273-TALK (toll free, 24 hour hotline) if experiencing a Mental Health or Behavioral Health Crisis  check blood sugar at prescribed times: once daily, when you have symptoms of low or high blood sugar, and check blood sugars more consistently. The patient does not always check as he should  check feet daily for cuts, sores or redness trim toenails straight across fill half of plate with vegetables limit fast food meals to no more than 1 per week prepare main meal at home 3 to 5 days each week read food labels for fat, fiber, carbohydrates and portion size keep feet up while sitting wash and dry feet carefully every day wear comfortable, cotton socks wear comfortable, well-fitting shoes  Follow Up Plan: Telephone follow up appointment with care management team member scheduled for: 07-22-2023 at 345 pm       CCM Expected Outcome:  Monitor, Self-Manage, and Reduce Symptoms of Hypertension       Current Barriers:  Care Coordination needs related to food resources and help with cost constraints of food in a patient with HTN and other chronic conditions Chronic Disease Management support and education needs related to effective management of HTN Financial Constraints.   Planned Interventions: Evaluation of current treatment plan related to hypertension self management and patient's adherence to plan as established by provider;   Provided education to patient re: stroke prevention, s/s of heart attack and stroke; Reviewed prescribed diet heart healthy/ADA diet  Reviewed medications with patient and discussed importance of compliance;  Discussed plans with patient for ongoing care  management follow up and provided patient with direct contact information for care management team; Advised patient, providing education and rationale, to monitor blood pressure daily and record, calling PCP for findings outside established parameters;  Reviewed scheduled/upcoming provider appointments including: 08-05-2023 with pcp Advised patient to discuss changes in HTN and heart health with provider; Provided education on prescribed diet heart healthy/ADA diet ;  Discussed complications of poorly controlled blood pressure such as heart disease, stroke, circulatory complications, vision complications, kidney impairment, sexual dysfunction;  Screening for signs and symptoms of depression related to chronic disease state;  Assessed social determinant of health barriers;   Symptom Management: Take medications as prescribed   Attend all scheduled provider appointments Call provider office for new concerns or questions  call the Suicide and Crisis Lifeline: 988 call the [place] National Suicide Prevention Lifeline: 432-794-2574 or TTY: 959-149-5262 TTY 757 626 7871) to talk to a trained counselor call 1-800-273-TALK (toll free, 24 hour hotline) if experiencing a Mental Health or Behavioral Health Crisis  check blood pressure weekly learn about high blood pressure call doctor for signs and symptoms of high blood pressure develop an action plan for high blood pressure keep all doctor appointments take medications for blood pressure exactly as prescribed report new symptoms to your doctor  Follow  Up Plan: Telephone follow up appointment with care management team member scheduled for: 07-22-2023 at 345 pm        Plan:Telephone follow up appointment with care management team member scheduled for:  07-22-2023 at 345 pm  Alto Denver RN, MSN, CCM RN Care Manager  Chronic Care Management Direct Number: [phone]

## 2023-07-05 NOTE — Patient Instructions (Addendum)
Please call the care guide team at [phone] if you need to cancel or reschedule your appointment.   If you are experiencing a Mental Health or Behavioral Health Crisis or need someone to talk to, please call the Suicide and Crisis Lifeline: 988 call the [place] National Suicide Prevention Lifeline: 209-636-0111 or TTY: (670)208-4318 TTY (939) 706-5921) to talk to a trained counselor call 1-800-273-TALK (toll free, 24 hour hotline)   Following is a copy of the CCM Program Consent:  CCM service includes personalized support from designated clinical staff supervised by the physician, including individualized plan of care and coordination with other care providers 24/7 contact phone numbers for assistance for urgent and routine care needs. Service will only be billed when office clinical staff spend 20 minutes or more in a month to coordinate care. Only one practitioner may furnish and bill the service in a calendar month. The patient may stop CCM services at amy time (effective at the end of the month) by phone call to the office staff. The patient will be responsible for cost sharing (co-pay) or up to 20% of the service fee (after annual deductible is met)  Following is a copy of your full provider care plan:   Goals Addressed             This Visit's Progress    CCM Expected Outcome:  Monitor, Self-Manage and Reduce Symptoms of Diabetes       Current Barriers:  Knowledge Deficits related to the importance of checking blood sugars and recording and monitoring for spikes and normals in blood sugar readings and the importance of effective management of DM Care Coordination needs related to food resources for healthy eating  in a patient with DM and financial restrictions.  Chronic Disease Management support and education needs related to effective management of DM Financial Constraints.   Planned Interventions: Provided education to patient about basic DM disease process; Reviewed  medications with patient and discussed importance of medication adherence;        Reviewed prescribed diet with patient heart healthy/ADA diet. Will send information by mail on heart healthy/ADA diet; Counseled on importance of regular laboratory monitoring as prescribed;        Discussed plans with patient for ongoing care management follow up and provided patient with direct contact information for care management team;      Provided patient with written educational materials related to hypo and hyperglycemia and importance of correct treatment;       Reviewed scheduled/upcoming provider appointments including: 08-05-2023 with pcp;         Advised patient, providing education and rationale, to check cbg once daily, when you have symptoms of low or high blood sugar, and encouraged the patient to take his blood sugars on a more consistent basis  and record. The patient has not been taking his blood sugars every day. The patient said the highest he has seen is 300 and lowest 125. Encouraged the patient to take his blood sugars more frequently. Education provided. Will send information by mail to help with healthy eating.        call provider for findings outside established parameters;       Referral made to pharmacy team for assistance with ongoing support and education for effective management of DM and other chronic conditions;       Referral made to community resources care guide team for assistance with food resources for healthy eating and resources to help the patient with cost effective food options ;  Review of patient status, including review of consultants reports, relevant laboratory and other test results, and medications completed;       Advised patient to discuss changes in DM, medication changes, and other question/concerns related to managing DM with provider;      Screening for signs and symptoms of depression related to chronic disease state;        Assessed social determinant of  health barriers;         Symptom Management: Take medications as prescribed   Attend all scheduled provider appointments Call provider office for new concerns or questions  call the Suicide and Crisis Lifeline: 988 call the [place] National Suicide Prevention Lifeline: (610) 248-1027 or TTY: 952-710-9304 TTY 415-278-5877) to talk to a trained counselor call 1-800-273-TALK (toll free, 24 hour hotline) if experiencing a Mental Health or Behavioral Health Crisis  check blood sugar at prescribed times: once daily, when you have symptoms of low or high blood sugar, and check blood sugars more consistently. The patient does not always check as he should  check feet daily for cuts, sores or redness trim toenails straight across fill half of plate with vegetables limit fast food meals to no more than 1 per week prepare main meal at home 3 to 5 days each week read food labels for fat, fiber, carbohydrates and portion size keep feet up while sitting wash and dry feet carefully every day wear comfortable, cotton socks wear comfortable, well-fitting shoes  Follow Up Plan: Telephone follow up appointment with care management team member scheduled for: 07-22-2023 at 345 pm       CCM Expected Outcome:  Monitor, Self-Manage, and Reduce Symptoms of Hypertension       Current Barriers:  Care Coordination needs related to food resources and help with cost constraints of food in a patient with HTN and other chronic conditions Chronic Disease Management support and education needs related to effective management of HTN Financial Constraints.    BP Readings from Last 3 Encounters:  06/03/23 (!) 142/71  05/04/23 129/77  02/02/23 123/76    Planned Interventions: Evaluation of current treatment plan related to hypertension self management and patient's adherence to plan as established by provider;   Provided education to patient re: stroke prevention, s/s of heart attack and stroke; Reviewed prescribed  diet heart healthy/ADA diet  Reviewed medications with patient and discussed importance of compliance;  Discussed plans with patient for ongoing care management follow up and provided patient with direct contact information for care management team; Advised patient, providing education and rationale, to monitor blood pressure daily and record, calling PCP for findings outside established parameters;  Reviewed scheduled/upcoming provider appointments including: 08-05-2023 with pcp Advised patient to discuss changes in HTN and heart health with provider; Provided education on prescribed diet heart healthy/ADA diet ;  Discussed complications of poorly controlled blood pressure such as heart disease, stroke, circulatory complications, vision complications, kidney impairment, sexual dysfunction;  Screening for signs and symptoms of depression related to chronic disease state;  Assessed social determinant of health barriers;   Symptom Management: Take medications as prescribed   Attend all scheduled provider appointments Call provider office for new concerns or questions  call the Suicide and Crisis Lifeline: 988 call the [place] National Suicide Prevention Lifeline: 336-833-5269 or TTY: (315)511-5581 TTY (519) 058-0139) to talk to a trained counselor call 1-800-273-TALK (toll free, 24 hour hotline) if experiencing a Mental Health or Behavioral Health Crisis  check blood pressure weekly learn about high blood pressure call doctor for  signs and symptoms of high blood pressure develop an action plan for high blood pressure keep all doctor appointments take medications for blood pressure exactly as prescribed report new symptoms to your doctor  Follow Up Plan: Telephone follow up appointment with care management team member scheduled for: 07-22-2023 at 345 pm          The patient verbalized understanding of instructions, educational materials, and care plan provided today and agreed to receive a  mailed copy of patient instructions, educational materials, and care plan.  Telephone follow up appointment with care management team member scheduled for: 07-22-2023 at 345 pm    Blood Glucose Monitoring, Adult To manage your diabetes, you will need to keep track of your blood sugar (glucose). Check your blood glucose as often as told. Keep a record of your results over time. This can help you: Know when to adjust your diabetes management plan with your health care provider. See how food, exercise, illness, and medicines affect your blood glucose. Know what your blood glucose is at any time. Your provider will set specific goals for your blood glucose levels. In many cases, these goals may be: Before meals (preprandial): 80-130 mg/dL (1.6-1.0 mmol/L). After meals (postprandial): below 180 mg/dL (10 mmol/L). A1C level: less than 7%. Supplies needed: Blood glucose meter. Test strips for your meter. Each meter has its own strips. You must use the strips that came with your meter. A needle to prick your finger (lancet). Do not use a lancet more than once. A device that holds the lancet (lancing device). A journal or logbook to write down your results. How to check your blood glucose Checking your blood glucose  Wash your hands with soap and water for at least 20 seconds. Prick the side of your finger with the lancet. Do not prick the tip of your finger. Do not use the same finger more than once. Gently rub the finger until a small drop of blood appears. Follow the instructions that came with the meter about how to insert the test strip, apply blood to the strip, and use the meter. Write down your result and any notes. Using alternative sites Some meters let you use other areas of your body (alternative sites) to test your blood. The most common places are the forearm, the thigh, and the palm of your hand. Alternative sites may not be as accurate as your fingers. The result you get may also  be delayed. Use the finger only, and do not use alternative sites, if: You think you have low blood glucose (hypoglycemia). You sometimes do not know that your blood glucose is getting low (hypoglycemia unawareness). General tips and recommendations Blood glucose log  Write down the result each time you check your blood glucose. Note anything that may be affecting your blood glucose. This can help you and your provider: Look for patterns over time. Adjust your management plan as needed. Check if your meter has an app or lets you download your records to a computer. Most meters keep a record of glucose readings in the meter. If you have type 1 diabetes: You may need to check your blood glucose 4 or more times a day. Check your blood glucose as often as told by your provider. This may include: Before each meal and snack. Two hours after a meal. Before bedtime. If you have symptoms of hypoglycemia. After treating your hypoglycemia. Before doing things that have a risk of injury, such as driving or using machinery. Before and  after exercise. Between 2:00 a.m. and 3:00 a.m., as told. You may need to check your blood glucose more often, such as up to 6-10 times a day, if: You have diabetes that is not well controlled. You are ill. You have a history of severe hypoglycemia. You have hypoglycemia unawareness. If you have type 2 diabetes: You may need to check your blood glucose 2 or more times a day. Check your blood glucose as often as told by your provider. This may include: Before and after exercise. Before doing things that have a risk of injury, such as driving or using machinery. You may need to check your blood glucose more often if: Your medicine is being adjusted. Your diabetes is not well controlled. You are ill. General tips Make sure you always have your supplies with you. After you use a few boxes of test strips, adjust (calibrate) your blood glucose meter. Follow the  instructions that came with your meter. If you have questions or need help, all blood glucose meters have a 24-hour hotline phone number that you can call. Also contact your provider with any questions or concerns. Where to find more information The American Diabetes Association: diabetes.org The Association of Diabetes Care & Education Specialists: diabeteseducator.org Contact a health care provider if: Your blood glucose is at or above 240 mg/dL (16.1 mmol/L) for 2 days in a row. You have been sick or have had a fever for 2 days or longer and are not getting better. You have any of these problems for more than 6 hours: You cannot eat or drink. You have nausea or vomiting. You have diarrhea. Get help right away if: Your blood glucose is lower than 54 mg/dL (3 mmol/L). You become confused, or you have trouble thinking clearly. You have trouble breathing. You have moderate to high ketone levels in your pee (urine). These symptoms may be an emergency. Get help right away. Call 911. Do not wait to see if the symptoms will go away. Do not drive yourself to the hospital. This information is not intended to replace advice given to you by your health care provider. Make sure you discuss any questions you have with your health care provider. Document Revised: 10/23/2022 Document Reviewed: 10/23/2022 Elsevier Patient Education  2024 [employer]. Hypoglycemia Hypoglycemia is when the sugar (glucose) level in your blood is too low. Low blood sugar can happen to people who have diabetes and people who do not have diabetes. Low blood sugar can happen quickly, and it can be an emergency. What are the causes? This condition happens most often in people who have diabetes. It may be caused by: Diabetes medicine. Not eating enough, or not eating often enough. Doing more physical activity. Drinking alcohol on an empty stomach. If you do not have diabetes, this condition may be caused by: A tumor in  the pancreas. Not eating enough, or not eating for long periods at a time (fasting). A very bad infection or illness. Problems after having weight loss (bariatric) surgery. Kidney failure or liver failure. Certain medicines. What increases the risk? This condition is more likely to develop in people who: Have diabetes and take medicines to lower their blood sugar. Abuse alcohol. Have a very bad illness. What are the signs or symptoms? Mild Hunger. Sweating and feeling clammy. Feeling dizzy or light-headed. Being sleepy or having trouble sleeping. Feeling like you may vomit (nauseous). A fast heartbeat. A headache. Blurry vision. Mood changes, such as: Being grouchy. Feeling worried or nervous (anxious).  Tingling or loss of feeling (numbness) around your mouth, lips, or tongue. Moderate Confusion and poor judgment. Behavior changes. Weakness. Uneven heartbeat. Trouble with moving (coordination). Very low Very low blood sugar (severe hypoglycemia) is a medical emergency. It can cause: Fainting. Seizures. Loss of consciousness (coma). Death. How is this treated? Treating low blood sugar Low blood sugar is often treated by eating or drinking something that has sugar in it right away. The food or drink should contain 15 grams of a fast-acting carb (carbohydrate). Options include: 4 oz (120 mL) of fruit juice. 4 oz (120 mL) of regular soda (not diet soda). A few pieces of hard candy. Check food labels to see how many pieces to eat for 15 grams. 1 Tbsp (15 mL) of sugar or honey. 4 glucose tablets. 1 tube of glucose gel. Treating low blood sugar if you have diabetes If you can think clearly and swallow safely, follow the 15:15 rule: Take 15 grams of a fast-acting carb. Talk with your doctor about how much you should take. Always keep a source of fast-acting carb with you, such as: Glucose tablets (take 4 tablets). A few pieces of hard candy. Check food labels to see how  many pieces to eat for 15 grams. 4 oz (120 mL) of fruit juice. 4 oz (120 mL) of regular soda (not diet soda). 1 Tbsp (15 mL) of honey or sugar. 1 tube of glucose gel. Check your blood sugar 15 minutes after you take the carb. If your blood sugar is still at or below 70 mg/dL (3.9 mmol/L), take 15 grams of a carb again. If your blood sugar does not go above 70 mg/dL (3.9 mmol/L) after 3 tries, get help right away. After your blood sugar goes back to normal, eat a meal or a snack within 1 hour.  Treating very low blood sugar If your blood sugar is below 54 mg/dL (3 mmol/L), you have very low blood sugar, or severe hypoglycemia. This is an emergency. Get medical help right away. If you have very low blood sugar and you cannot eat or drink, you will need to be given a hormone called glucagon. A family member or friend should learn how to check your blood sugar and how to give you glucagon. Ask your doctor if you need to have an emergency glucagon kit at home. Very low blood sugar may also need to be treated in a hospital. Follow these instructions at home: General instructions Take over-the-counter and prescription medicines only as told by your doctor. Stay aware of your blood sugar as told by your doctor. If you drink alcohol: Limit how much you have to: 0-1 drink a day for women who are not pregnant. 0-2 drinks a day for men. Know how much alcohol is in your drink. In the [place] one drink equals one 12 oz bottle of beer (355 mL), one 5 oz glass of wine (148 mL), or one 1 oz glass of hard liquor (44 mL). Be sure to eat food when you drink alcohol. Know that your body absorbs alcohol quickly. This may lead to low blood sugar later. Be sure to keep checking your blood sugar. Keep all follow-up visits. If you have diabetes:  Always have a fast-acting carb (15 grams) with you to treat low blood sugar. Follow your diabetes care plan as told by your doctor. Make sure you: Know the symptoms of  low blood sugar. Check your blood sugar as often as told. Always check it before and after exercise.  Always check your blood sugar before you drive. Take your medicines as told. Follow your meal plan. Eat on time. Do not skip meals. Share your diabetes care plan with: Your work or school. People you live with. Carry a card or wear jewelry that says you have diabetes. Where to find more information American Diabetes Association: www.diabetes.org Contact a doctor if: You have trouble keeping your blood sugar in your target range. You have low blood sugar often. Get help right away if: You still have symptoms after you eat or drink something that contains 15 grams of fast-acting carb, and you cannot get your blood sugar above 70 mg/dL by following the 14:78 rule. Your blood sugar is below 54 mg/dL (3 mmol/L). You have a seizure. You faint. These symptoms may be an emergency. Get help right away. Call your local emergency services (911 in the [place] Do not wait to see if the symptoms will go away. Do not drive yourself to the hospital. Summary Hypoglycemia happens when the level of sugar (glucose) in your blood is too low. Low blood sugar can happen to people who have diabetes and people who do not have diabetes. Low blood sugar can happen quickly, and it can be an emergency. Make sure you know the symptoms of low blood sugar and know how to treat it. Always keep a source of sugar (fast-acting carb) with you to treat low blood sugar. This information is not intended to replace advice given to you by your health care provider. Make sure you discuss any questions you have with your health care provider. Document Revised: 11/07/2020 Document Reviewed: 11/07/2020 Elsevier Patient Education  2024 Else[employer] Inc. Hyperglycemia Hyperglycemia is when the sugar (glucose) level in your blood is too high. High blood sugar can happen to people who have or do not have diabetes. High blood sugar can  happen quickly. It can be an emergency. What are the causes? If you have diabetes, high blood sugar may be caused by: Medicines that increase blood sugar or affect your control of diabetes. Getting less physical activity. Overeating. Being sick or injured or having an infection. Having surgery. Stress. Not giving yourself enough insulin (if you are taking it). You may have high blood sugar because you have diabetes that has not been diagnosed yet. If you do not have diabetes, high blood sugar may be caused by: Certain medicines. Stress. A bad illness. An infection. Having surgery. Diseases of the pancreas. What increases the risk? This condition is more likely to develop in people who have risk factors for diabetes, such as: Having a family member with diabetes. Certain conditions in which the body's defense system (immune system) attacks itself. These are called autoimmune disorders. Being overweight. Not being active. Having a condition called insulin resistance. Having a history of: Prediabetes. Diabetes when pregnant. Polycystic ovarian syndrome (PCOS). What are the signs or symptoms? This condition may not cause symptoms. If you do have symptoms, they may include: Feeling more thirsty than normal. Needing to pee (urinate) more often than normal. Hunger. Feeling very tired. Blurry eyesight (vision). You may get other symptoms as the condition gets worse, such as: Dry mouth. Pain in your belly (abdomen). Not being hungry (loss of appetite). Breath that smells fruity. Weakness. Weight loss that is not planned. A tingling or numb feeling in your hands or feet. A headache. Cuts or bruises that heal slowly. How is this treated? Treatment depends on the cause of your condition. Treatment may include: Taking medicine to control  your blood sugar levels. Changing your medicine or dosage if you take insulin or other diabetes medicines. Lifestyle changes. These may  include: Exercising more. Eating healthier foods. Losing weight. Treating an illness or infection. Checking your blood sugar more often. Stopping or reducing steroid medicines. If your condition gets very bad, you will need to be treated in the hospital. Follow these instructions at home: General instructions Take over-the-counter and prescription medicines only as told by your doctor. Do not smoke or use any products that contain nicotine or tobacco. If you need help quitting, ask your doctor. If you drink alcohol: Limit how much you have to: 0-1 drink a day for women who are not pregnant. 0-2 drinks a day for men. Know how much alcohol is in a drink. In the [place] one drink equals one 12 oz bottle of beer (355 mL), one 5 oz glass of wine (148 mL), or one 1 oz glass of hard liquor (44 mL). Manage stress. If you need help with this, ask your doctor. Do exercises as told by your doctor. Keep all follow-up visits. Eating and drinking  Stay at a healthy weight. Make sure you drink enough fluid when you: Exercise. Get sick. Are in hot temperatures. Drink enough fluid to keep your pee (urine) pale yellow. If you have diabetes:  Know the symptoms of high blood sugar. Follow your diabetes management plan as told by your doctor. Make sure you: Take insulin and medicines as told. Follow your exercise plan. Follow your meal plan. Eat on time. Do not skip meals. Check your blood sugar as often as told. Make sure you check before and after exercise. If you exercise longer or in a different way, check your blood sugar more often. Follow your sick day plan whenever you cannot eat or drink normally. Make this plan ahead of time with your doctor. Share your diabetes management plan with people in your workplace, school, and household. Check your pee for ketones when you are ill and as told by your doctor. Carry a card or wear jewelry that says that you have diabetes. Where to find more  information American Diabetes Association: www.diabetes.org Contact a doctor if: Your blood sugar level is at or above 240 mg/dL (40.9 mmol/L) for 2 days in a row. You have problems keeping your blood sugar in your target range. You have high blood pressure often. You have signs of illness, such as: Feeling like you may vomit (feeling nauseous). Vomiting. A fever. Get help right away if: Your blood sugar monitor reads "high" even when you are taking insulin. You have trouble breathing. You have a change in how you think, feel, or act (mental status). You feel like you may vomit, and the feeling does not go away. You cannot stop vomiting. These symptoms may be an emergency. Get medical help right away. Call your local emergency services (911 in the [place] Do not wait to see if the symptoms will go away. Do not drive yourself to the hospital. Summary Hyperglycemia is when the sugar (glucose) level in your blood is too high. High blood sugar can happen to people who have or do not have diabetes. Make sure you drink enough fluids and follow your meal plan. Exercise as often as told by your doctor. Contact your doctor if you have problems keeping your blood sugar in your target range. This information is not intended to replace advice given to you by your health care provider. Make sure you discuss any questions you  have with your health care provider. Document Revised: 09/19/2020 Document Reviewed: 09/20/2020 Elsevier Patient Education  2024 Elsevier Inc. Diabetes Mellitus and Nutrition, Adult When you have diabetes, or diabetes mellitus, it is very important to have healthy eating habits because your blood sugar (glucose) levels are greatly affected by what you eat and drink. Eating healthy foods in the right amounts, at about the same times every day, can help you: Manage your blood glucose. Lower your risk of heart disease. Improve your blood pressure. Reach or maintain a healthy  weight. What can affect my meal plan? Every person with diabetes is different, and each person has different needs for a meal plan. Your health care provider may recommend that you work with a dietitian to make a meal plan that is best for you. Your meal plan may vary depending on factors such as: The calories you need. The medicines you take. Your weight. Your blood glucose, blood pressure, and cholesterol levels. Your activity level. Other health conditions you have, such as heart or kidney disease. How do carbohydrates affect me? Carbohydrates, also called carbs, affect your blood glucose level more than any other type of food. Eating carbs raises the amount of glucose in your blood. It is important to know how many carbs you can safely have in each meal. This is different for every person. Your dietitian can help you calculate how many carbs you should have at each meal and for each snack. How does alcohol affect me? Alcohol can cause a decrease in blood glucose (hypoglycemia), especially if you use insulin or take certain diabetes medicines by mouth. Hypoglycemia can be a life-threatening condition. Symptoms of hypoglycemia, such as sleepiness, dizziness, and confusion, are similar to symptoms of having too much alcohol. Do not drink alcohol if: Your health care provider tells you not to drink. You are pregnant, may be pregnant, or are planning to become pregnant. If you drink alcohol: Limit how much you have to: 0-1 drink a day for women. 0-2 drinks a day for men. Know how much alcohol is in your drink. In the [place] one drink equals one 12 oz bottle of beer (355 mL), one 5 oz glass of wine (148 mL), or one 1 oz glass of hard liquor (44 mL). Keep yourself hydrated with water, diet soda, or unsweetened iced tea. Keep in mind that regular soda, juice, and other mixers may contain a lot of sugar and must be counted as carbs. What are tips for following this plan?  Reading food  labels Start by checking the serving size on the Nutrition Facts label of packaged foods and drinks. The number of calories and the amount of carbs, fats, and other nutrients listed on the label are based on one serving of the item. Many items contain more than one serving per package. Check the total grams (g) of carbs in one serving. Check the number of grams of saturated fats and trans fats in one serving. Choose foods that have a low amount or none of these fats. Check the number of milligrams (mg) of salt (sodium) in one serving. Most people should limit total sodium intake to less than 2,300 mg per day. Always check the nutrition information of foods labeled as "low-fat" or "nonfat." These foods may be higher in added sugar or refined carbs and should be avoided. Talk to your dietitian to identify your daily goals for nutrients listed on the label. Shopping Avoid buying canned, pre-made, or processed foods. These foods tend to be  high in fat, sodium, and added sugar. Shop around the outside edge of the grocery store. This is where you will most often find fresh fruits and vegetables, bulk grains, fresh meats, and fresh dairy products. Cooking Use low-heat cooking methods, such as baking, instead of high-heat cooking methods, such as deep frying. Cook using healthy oils, such as olive, canola, or sunflower oil. Avoid cooking with butter, cream, or high-fat meats. Meal planning Eat meals and snacks regularly, preferably at the same times every day. Avoid going long periods of time without eating. Eat foods that are high in fiber, such as fresh fruits, vegetables, beans, and whole grains. Eat 4-6 oz (112-168 g) of lean protein each day, such as lean meat, chicken, fish, eggs, or tofu. One ounce (oz) (28 g) of lean protein is equal to: 1 oz (28 g) of meat, chicken, or fish. 1 egg.  cup (62 g) of tofu. Eat some foods each day that contain healthy fats, such as avocado, nuts, seeds, and  fish. What foods should I eat? Fruits Berries. Apples. Oranges. Peaches. Apricots. Plums. Grapes. Mangoes. Papayas. Pomegranates. Kiwi. Cherries. Vegetables Leafy greens, including lettuce, spinach, kale, chard, collard greens, mustard greens, and cabbage. Beets. Cauliflower. Broccoli. Carrots. Green beans. Tomatoes. Peppers. Onions. Cucumbers. Brussels sprouts. Grains Whole grains, such as whole-wheat or whole-grain bread, crackers, tortillas, cereal, and pasta. Unsweetened oatmeal. Quinoa. Brown or wild rice. Meats and other proteins Seafood. Poultry without skin. Lean cuts of poultry and beef. Tofu. Nuts. Seeds. Dairy Low-fat or fat-free dairy products such as milk, yogurt, and cheese. The items listed above may not be a complete list of foods and beverages you can eat and drink. Contact a dietitian for more information. What foods should I avoid? Fruits Fruits canned with syrup. Vegetables Canned vegetables. Frozen vegetables with butter or cream sauce. Grains Refined white flour and flour products such as bread, pasta, snack foods, and cereals. Avoid all processed foods. Meats and other proteins Fatty cuts of meat. Poultry with skin. Breaded or fried meats. Processed meat. Avoid saturated fats. Dairy Full-fat yogurt, cheese, or milk. Beverages Sweetened drinks, such as soda or iced tea. The items listed above may not be a complete list of foods and beverages you should avoid. Contact a dietitian for more information. Questions to ask a health care provider Do I need to meet with a certified diabetes care and education specialist? Do I need to meet with a dietitian? What number can I call if I have questions? When are the best times to check my blood glucose? Where to find more information: American Diabetes Association: diabetes.org Academy of Nutrition and Dietetics: eatright.[employer] of Diabetes and Digestive and Kidney Diseases: StageSync.si Association of  Diabetes Care & Education Specialists: diabeteseducator.org Summary It is important to have healthy eating habits because your blood sugar (glucose) levels are greatly affected by what you eat and drink. It is important to use alcohol carefully. A healthy meal plan will help you manage your blood glucose and lower your risk of heart disease. Your health care provider may recommend that you work with a dietitian to make a meal plan that is best for you. This information is not intended to replace advice given to you by your health care provider. Make sure you discuss any questions you have with your health care provider. Document Revised: 07/10/2020 Document Reviewed: 07/10/2020 Elsevier Patient Education  2024 [employer].

## 2023-07-06 ENCOUNTER — Telehealth: Payer: Self-pay | Admitting: *Deleted

## 2023-07-06 NOTE — Telephone Encounter (Signed)
   Telephone encounter was:  Successful.  07/06/2023 Name: Lucas Weiss MRN: [id number] DOB: March 30, 1956  Lucas Weiss is a 67 y.o. year old male who is a primary care patient of Dorcas Carrow, DO . The community resource team was consulted for assistance with Food InsecurityWill mail Snap and medicaid application food bank lists as well housing improvement resoruces to patient , said [name] is educating about his disease   Care guide performed the following interventions: Patient provided with information about care guide support team and interviewed to confirm resource needs.  Follow Up Plan:  No further follow up planned at this time. The patient has been provided with needed resources.  [name] -[facility] [place], Population Health [phone] [address] [place] , [place] [address] Email : [email or web address]

## 2023-07-09 ENCOUNTER — Other Ambulatory Visit: Payer: Medicare HMO

## 2023-07-13 ENCOUNTER — Other Ambulatory Visit: Payer: Medicare HMO

## 2023-07-13 ENCOUNTER — Telehealth: Payer: Self-pay

## 2023-07-13 NOTE — Telephone Encounter (Signed)
1 box of Rybelsus received for the patient. Called and notified patient that the medication was ready to be picked up.

## 2023-07-13 NOTE — Telephone Encounter (Signed)
Medication picked up by the patient.  

## 2023-07-13 NOTE — Progress Notes (Unsigned)
   07/13/2023  Patient ID: Lucas Weiss, male   DOB: 26-Feb-1956, 67 y.o.   MRN: [id number]  Skin irritation any better w/o glipizide? Yes, this has improved Picked up Rybelsus-just one box?  What strength? FBG? 160 Was w/o Rybelsus for 4 days BP states sometimes high and sometimes low but cannot provide exact value- does check at home on occasion  Order change for Rybelsus 14mg  daily  f/u 8/15 w/ Dr. Laural Benes  F/u 8/6

## 2023-07-15 ENCOUNTER — Telehealth: Payer: Self-pay

## 2023-07-15 NOTE — Telephone Encounter (Signed)
Refaxed Rybelsus 14mg  refills to [facility] - 120 day supply.

## 2023-07-16 NOTE — Patient Instructions (Signed)
error 

## 2023-07-19 NOTE — Telephone Encounter (Signed)
Refills rec'd and approved by [facility] 07/16/23.   Medication should arrive to office in 10-14 business days.

## 2023-07-22 ENCOUNTER — Other Ambulatory Visit: Payer: Self-pay

## 2023-07-22 ENCOUNTER — Telehealth: Payer: Medicare HMO

## 2023-07-22 ENCOUNTER — Other Ambulatory Visit: Payer: Medicare HMO

## 2023-07-22 NOTE — Patient Instructions (Signed)
Visit Information  Thank you for taking time to visit with me today. Please don't hesitate to contact me if I can be of assistance to you before our next scheduled telephone appointment.  Following are the goals we discussed today:   Goals Addressed             This Visit's Progress    RNCM Care Management Expected Outcome:  Monitor, Self-Manage and Reduce Symptoms of Diabetes       Current Barriers:  Knowledge Deficits related to the importance of checking blood sugars and recording and monitoring for spikes and normals in blood sugar readings and the importance of effective management of DM Care Coordination needs related to food resources for healthy eating  in a patient with DM and financial restrictions.  Chronic Disease Management support and education needs related to effective management of DM Financial Constraints.   Planned Interventions: Provided education to patient about basic DM disease process. The patient states his blood sugars have been up and down. The patient states he is hoping when he gets his increase in medications his blood sugars are going to be better.; Reviewed medications with patient and discussed importance of medication adherence. Working with pharm D for PAP with medications. Is taking his medications as ordered. Rybelsus has been increased to 14mg  and he is hoping to be able to get it and start taking soon.;        Reviewed prescribed diet with patient heart healthy/ADA diet. Education given. The patient is compliant with dietary restrictions.; Counseled on importance of regular laboratory monitoring as prescribed. Has labs on a regular basis;        Discussed plans with patient for ongoing care management follow up and provided patient with direct contact information for care management team;      Provided patient with written educational materials related to hypo and hyperglycemia and importance of correct treatment. Lowest he has seen is 160 and highest  around 200;       Reviewed scheduled/upcoming provider appointments including: 08-05-2023 with pcp;         Advised patient, providing education and rationale, to check cbg once daily, when you have symptoms of low or high blood sugar, and encouraged the patient to take his blood sugars on a more consistent basis  and record. The patient has not been taking his blood sugars every day. The patient denies any acute changes in blood sugars call provider for findings outside established parameters;       Referral made to pharmacy team for assistance with ongoing support and education for effective management of DM and other chronic conditions. Works with pharm D on a regular basis;       Referral made to community resources care guide team for assistance with food resources for healthy eating and resources to help the patient with cost effective food options ;      Review of patient status, including review of consultants reports, relevant laboratory and other test results, and medications completed;       Advised patient to discuss changes in DM, medication changes, and other question/concerns related to managing DM with provider;      Screening for signs and symptoms of depression related to chronic disease state;        Assessed social determinant of health barriers;         Symptom Management: Take medications as prescribed   Attend all scheduled provider appointments Call provider office for new concerns or questions  call the Suicide and Crisis Lifeline: 988 call the [place] National Suicide Prevention Lifeline: 820-768-0574 or TTY: (763)877-5005 TTY 463-133-3061) to talk to a trained counselor call 1-800-273-TALK (toll free, 24 hour hotline) if experiencing a Mental Health or Behavioral Health Crisis  check blood sugar at prescribed times: once daily, when you have symptoms of low or high blood sugar, and check blood sugars more consistently. The patient does not always check as he should  check  feet daily for cuts, sores or redness trim toenails straight across fill half of plate with vegetables limit fast food meals to no more than 1 per week prepare main meal at home 3 to 5 days each week read food labels for fat, fiber, carbohydrates and portion size keep feet up while sitting wash and dry feet carefully every day wear comfortable, cotton socks wear comfortable, well-fitting shoes  Follow Up Plan: Telephone follow up appointment with care management team member scheduled for: 09-09-2023 at 345 pm       RNCM Care Management Expected Outcome:  Monitor, Self-Manage, and Reduce Symptoms of Hypertension       Current Barriers:  Care Coordination needs related to food resources and help with cost constraints of food in a patient with HTN and other chronic conditions Chronic Disease Management support and education needs related to effective management of HTN Financial Constraints.    BP Readings from Last 3 Encounters:  06/03/23 (!) 142/71  05/04/23 129/77  02/02/23 123/76    Planned Interventions: Evaluation of current treatment plan related to hypertension self management and patient's adherence to plan as established by provider. The patient has had blood pressures up and down. Denies amy acute highs. ;   Provided education to patient re: stroke prevention, s/s of heart attack and stroke; Reviewed prescribed diet heart healthy/ADA diet. Has talked with care guides for resources to help with food. The patient has some paperwork to fill out. Education provided.  Reviewed medications with patient and discussed importance of compliance. Takes medications as directed. Works with pharm D ;  Discussed plans with patient for ongoing care management follow up and provided patient with direct contact information for care management team; Advised patient, providing education and rationale, to monitor blood pressure daily and record, calling PCP for findings outside established  parameters;  Reviewed scheduled/upcoming provider appointments including: 08-05-2023 with pcp Advised patient to discuss changes in HTN and heart health with provider; Provided education on prescribed diet heart healthy/ADA diet ;  Discussed complications of poorly controlled blood pressure such as heart disease, stroke, circulatory complications, vision complications, kidney impairment, sexual dysfunction;  Screening for signs and symptoms of depression related to chronic disease state;  Assessed social determinant of health barriers;   Symptom Management: Take medications as prescribed   Attend all scheduled provider appointments Call provider office for new concerns or questions  call the Suicide and Crisis Lifeline: 988 call the [place] National Suicide Prevention Lifeline: (231)780-4323 or TTY: 909-422-0053 TTY 512-096-2352) to talk to a trained counselor call 1-800-273-TALK (toll free, 24 hour hotline) if experiencing a Mental Health or Behavioral Health Crisis  check blood pressure weekly learn about high blood pressure call doctor for signs and symptoms of high blood pressure develop an action plan for high blood pressure keep all doctor appointments take medications for blood pressure exactly as prescribed report new symptoms to your doctor  Follow Up Plan: Telephone follow up appointment with care management team member scheduled for: 09-09-2023 at 345 pm  Our next appointment is by telephone on 09-09-2023 at 345 pm  Please call the care guide team at [phone] if you need to cancel or reschedule your appointment.   If you are experiencing a Mental Health or Behavioral Health Crisis or need someone to talk to, please call the Suicide and Crisis Lifeline: 988 call the [place] National Suicide Prevention Lifeline: 904-040-1522 or TTY: 701-582-9335 TTY 620-383-3395) to talk to a trained counselor call 1-800-273-TALK (toll free, 24 hour hotline)   The patient  verbalized understanding of instructions, educational materials, and care plan provided today and DECLINED offer to receive copy of patient instructions, educational materials, and care plan.     Alto Denver RN, MSN, CCM RN Care Manager  [facility]  Ambulatory Care Management  Direct Number: [phone]

## 2023-07-22 NOTE — Patient Outreach (Signed)
Care Management   Visit Note  07/22/2023 Name: Lucas Weiss MRN: [id number] DOB: [birth year]  Subjective: Lucas Weiss is a 67 y.o. year old male who is a primary care patient of Dorcas Carrow, DO. The Care Management team was consulted for assistance.      Engaged with patient Engaged with patient by telephone.   Assessment:   Goals Addressed             This Visit's Progress    RNCM Care Management Expected Outcome:  Monitor, Self-Manage and Reduce Symptoms of Diabetes       Current Barriers:  Knowledge Deficits related to the importance of checking blood sugars and recording and monitoring for spikes and normals in blood sugar readings and the importance of effective management of DM Care Coordination needs related to food resources for healthy eating  in a patient with DM and financial restrictions.  Chronic Disease Management support and education needs related to effective management of DM Financial Constraints.   Planned Interventions: Provided education to patient about basic DM disease process. The patient states his blood sugars have been up and down. The patient states he is hoping when he gets his increase in medications his blood sugars are going to be better.; Reviewed medications with patient and discussed importance of medication adherence. Working with pharm D for PAP with medications. Is taking his medications as ordered. Rybelsus has been increased to 14mg  and he is hoping to be able to get it and start taking soon.;        Reviewed prescribed diet with patient heart healthy/ADA diet. Education given. The patient is compliant with dietary restrictions.; Counseled on importance of regular laboratory monitoring as prescribed. Has labs on a regular basis;        Discussed plans with patient for ongoing care management follow up and provided patient with direct contact information for care management team;      Provided patient with written educational materials  related to hypo and hyperglycemia and importance of correct treatment. Lowest he has seen is 160 and highest around 200;       Reviewed scheduled/upcoming provider appointments including: 08-05-2023 with pcp;         Advised patient, providing education and rationale, to check cbg once daily, when you have symptoms of low or high blood sugar, and encouraged the patient to take his blood sugars on a more consistent basis  and record. The patient has not been taking his blood sugars every day. The patient denies any acute changes in blood sugars call provider for findings outside established parameters;       Referral made to pharmacy team for assistance with ongoing support and education for effective management of DM and other chronic conditions. Works with pharm D on a regular basis;       Referral made to community resources care guide team for assistance with food resources for healthy eating and resources to help the patient with cost effective food options ;      Review of patient status, including review of consultants reports, relevant laboratory and other test results, and medications completed;       Advised patient to discuss changes in DM, medication changes, and other question/concerns related to managing DM with provider;      Screening for signs and symptoms of depression related to chronic disease state;        Assessed social determinant of health barriers;  Symptom Management: Take medications as prescribed   Attend all scheduled provider appointments Call provider office for new concerns or questions  call the Suicide and Crisis Lifeline: 988 call the [place] National Suicide Prevention Lifeline: (414)779-7127 or TTY: (913) 202-5661 TTY 248-297-8388) to talk to a trained counselor call 1-800-273-TALK (toll free, 24 hour hotline) if experiencing a Mental Health or Behavioral Health Crisis  check blood sugar at prescribed times: once daily, when you have symptoms of low or high  blood sugar, and check blood sugars more consistently. The patient does not always check as he should  check feet daily for cuts, sores or redness trim toenails straight across fill half of plate with vegetables limit fast food meals to no more than 1 per week prepare main meal at home 3 to 5 days each week read food labels for fat, fiber, carbohydrates and portion size keep feet up while sitting wash and dry feet carefully every day wear comfortable, cotton socks wear comfortable, well-fitting shoes  Follow Up Plan: Telephone follow up appointment with care management team member scheduled for: 09-09-2023 at 345 pm       RNCM Care Management Expected Outcome:  Monitor, Self-Manage, and Reduce Symptoms of Hypertension       Current Barriers:  Care Coordination needs related to food resources and help with cost constraints of food in a patient with HTN and other chronic conditions Chronic Disease Management support and education needs related to effective management of HTN Financial Constraints.    BP Readings from Last 3 Encounters:  06/03/23 (!) 142/71  05/04/23 129/77  02/02/23 123/76    Planned Interventions: Evaluation of current treatment plan related to hypertension self management and patient's adherence to plan as established by provider. The patient has had blood pressures up and down. Denies amy acute highs. ;   Provided education to patient re: stroke prevention, s/s of heart attack and stroke; Reviewed prescribed diet heart healthy/ADA diet. Has talked with care guides for resources to help with food. The patient has some paperwork to fill out. Education provided.  Reviewed medications with patient and discussed importance of compliance. Takes medications as directed. Works with pharm D ;  Discussed plans with patient for ongoing care management follow up and provided patient with direct contact information for care management team; Advised patient, providing education and  rationale, to monitor blood pressure daily and record, calling PCP for findings outside established parameters;  Reviewed scheduled/upcoming provider appointments including: 08-05-2023 with pcp Advised patient to discuss changes in HTN and heart health with provider; Provided education on prescribed diet heart healthy/ADA diet ;  Discussed complications of poorly controlled blood pressure such as heart disease, stroke, circulatory complications, vision complications, kidney impairment, sexual dysfunction;  Screening for signs and symptoms of depression related to chronic disease state;  Assessed social determinant of health barriers;   Symptom Management: Take medications as prescribed   Attend all scheduled provider appointments Call provider office for new concerns or questions  call the Suicide and Crisis Lifeline: 988 call the [place] National Suicide Prevention Lifeline: (254)875-5784 or TTY: 470 654 0432 TTY 302-162-2904) to talk to a trained counselor call 1-800-273-TALK (toll free, 24 hour hotline) if experiencing a Mental Health or Behavioral Health Crisis  check blood pressure weekly learn about high blood pressure call doctor for signs and symptoms of high blood pressure develop an action plan for high blood pressure keep all doctor appointments take medications for blood pressure exactly as prescribed report new symptoms to your doctor  Follow Up Plan: Telephone follow up appointment with care management team member scheduled for: 09-09-2023 at 345 pm             Consent to Services:  Patient was given information about care management services, agreed to services, and gave verbal consent to participate.   Plan: Telephone follow up appointment with care management team member scheduled for: 09-09-2023 at 345 pm  Alto Denver RN, MSN, CCM RN Care Manager  [facility]  Ambulatory Care Management  Direct Number: [phone]

## 2023-07-27 ENCOUNTER — Other Ambulatory Visit: Payer: Medicare HMO

## 2023-07-27 NOTE — Progress Notes (Signed)
   07/27/2023  Patient ID: Lucas Weiss, male   DOB: 1956/07/22, 67 y.o.   MRN: [id number]  S/O Telephone visit to check on diabetes and hypertension control  DM -Current medications:  metformin XR 500mg  daily with breakfast, Rybelsus 7mg  daily -Patient states skin irritation has now completely resolved since glipizide has been stopped for several weeks -FBG inconsistent ranging 140-200 -Order change for Rybelsus 14mg  through [employer] PAP was recently approved, and the medication should arrive at [facility] in the next 7-10 business days  HTN -Current medications:  amlodipine 5mg  daily, lisinopril 40mg  daily, metoprolol 50mg  daily -Patient endorses having 2 BP monitors at home but not checking BP regularly -State he can tell it is elevated because he "stays upset all the time" -Last OV BP was 142/71 on 6/13  A/P  DM -Once Rybelsus 14mg  PAP supply is received, stop 7mg  and start increased dose -Continue to monitor BG- recommend FBG and 2hr post-prandial daily and record -May need to revisit increasing metformin XR 500mg  to twice daily or 2 tablets in the morning now that we now it was not causing previous skin irritation.  Would recommend waiting to make sure patient does not have GI effects from increase Rybelsus dose  HTN -Recommend checking home BP at least 2-3x/wk -Patient would likely benefit from increased BP control- could increase amlodipine to 10mg  daily and/or dose metoprolol tartrate 50mg  BID  Follow-up:  Will share recommendations with Dr. Laural Benes since patient sees her 8/15, and I will follow-up with him in about 4 weeks to see how Rybelsus 14mg  daily is tolerated/working  Lenna Gilford, PharmD, DPLA

## 2023-08-01 ENCOUNTER — Other Ambulatory Visit: Payer: Self-pay | Admitting: Family Medicine

## 2023-08-03 ENCOUNTER — Telehealth: Payer: Self-pay

## 2023-08-03 NOTE — Telephone Encounter (Signed)
4 boxes of Rybelsus 14 mg received for the patient. Called and notified patient that medication was here and ready to be picked up.

## 2023-08-03 NOTE — Telephone Encounter (Signed)
Requested Prescriptions  Refused Prescriptions Disp Refills   glipiZIDE (GLUCOTROL) 5 MG tablet [Pharmacy Med Name: glipiZIDE 5 MG TABLET] 180 tablet 1    Sig: TAKE 1 TABLET BY MOUTH TWICE A DAY BEFORE A MEAL     Endocrinology:  Diabetes - Sulfonylureas Failed - 08/01/2023  6:51 AM      Failed - HBA1C is between 0 and 7.9 and within 180 days    HB A1C (BAYER DCA - WAIVED)  Date Value Ref Range Status  05/04/2023 9.9 (H) 4.8 - 5.6 % Final    Comment:             Prediabetes: 5.7 - 6.4          Diabetes: >6.4          Glycemic control for adults with diabetes: <7.0          Failed - Cr in normal range and within 360 days    Creatinine  Date Value Ref Range Status  06/02/2012 1.10 0.60 - 1.30 mg/dL Final   Creatinine, Ser  Date Value Ref Range Status  06/03/2023 1.53 (H) 0.76 - 1.27 mg/dL Final         Passed - Valid encounter within last 6 months    Recent Outpatient Visits           2 months ago Left upper quadrant abdominal pain   [facility] Mecum, Erin E, PA-C   3 months ago Uncontrolled type 2 diabetes mellitus with hyperglycemia (HCC)   [place] [facility] [place], Megan P, DO   6 months ago Controlled type 2 diabetes mellitus with hyperglycemia, without long-term current use of insulin (HCC)   [place] [facility], Megan P, DO   8 months ago Controlled type 2 diabetes mellitus with hyperglycemia, without long-term current use of insulin (HCC)   [facility] [place], Megan P, DO   10 months ago Need for influenza vaccination   [place] [facility] Dorcas Carrow, DO       Future Appointments             In 2 days Dorcas Carrow, DO [facility]

## 2023-08-03 NOTE — Telephone Encounter (Signed)
Medication picked up by the patient.  

## 2023-08-05 ENCOUNTER — Encounter: Payer: Self-pay | Admitting: Family Medicine

## 2023-08-05 ENCOUNTER — Ambulatory Visit (INDEPENDENT_AMBULATORY_CARE_PROVIDER_SITE_OTHER): Payer: Medicare HMO | Admitting: Family Medicine

## 2023-08-05 VITALS — BP 132/88 | HR 71 | Temp 97.6°F | Wt 179.8 lb

## 2023-08-05 DIAGNOSIS — E1165 Type 2 diabetes mellitus with hyperglycemia: Secondary | ICD-10-CM | POA: Diagnosis not present

## 2023-08-05 DIAGNOSIS — Z7984 Long term (current) use of oral hypoglycemic drugs: Secondary | ICD-10-CM | POA: Diagnosis not present

## 2023-08-05 LAB — BAYER DCA HB A1C WAIVED: HB A1C (BAYER DCA - WAIVED): 9.3 % — ABNORMAL HIGH (ref 4.8–5.6)

## 2023-08-05 MED ORDER — RYBELSUS 7 MG PO TABS: 1.0000 | ORAL_TABLET | Freq: Every day | ORAL | Status: DC

## 2023-08-05 NOTE — Assessment & Plan Note (Signed)
Improved with A1c of 9.3 down from 9.9. Will continue current regimen and recheck in 6 weeks. May need to add jardiance. Recheck 6 weeks. Call with any concerns.

## 2023-08-05 NOTE — Progress Notes (Signed)
BP 132/88   Pulse 71   Temp 97.6 F (36.4 C) (Oral)   Wt 179 lb 12.8 oz (81.6 kg)   SpO2 98%   BMI 26.54 kg/m    Subjective:    Patient ID: Lucas Weiss, male    DOB: 12/05/1956, 67 y.o.   MRN: [id number]  HPI: Lucas Weiss is a 67 y.o. male  Chief Complaint  Patient presents with   Diabetes   DIABETES- only started his 14mg  2 days ago, has been tolerating his medicine well Hypoglycemic episodes:no Polydipsia/polyuria: no Visual disturbance: no Chest pain: no Paresthesias: no Glucose Monitoring: no  Accucheck frequency: Not Checking Taking Insulin?: no Blood Pressure Monitoring: not checking Retinal Examination: Not up to Date Foot Exam: Not up to Date Diabetic Education: Completed Pneumovax: Up to Date Influenza: Not up to Date Aspirin: yes  Relevant past medical, surgical, family and social history reviewed and updated as indicated. Interim medical history since our last visit reviewed. Allergies and medications reviewed and updated.  Review of Systems  Constitutional: Negative.   Respiratory: Negative.    Cardiovascular: Negative.   Gastrointestinal: Negative.   Musculoskeletal: Negative.   Skin: Negative.   Psychiatric/Behavioral: Negative.      Per HPI unless specifically indicated above     Objective:    BP 132/88   Pulse 71   Temp 97.6 F (36.4 C) (Oral)   Wt 179 lb 12.8 oz (81.6 kg)   SpO2 98%   BMI 26.54 kg/m   Wt Readings from Last 3 Encounters:  08/05/23 179 lb 12.8 oz (81.6 kg)  06/03/23 182 lb 3.2 oz (82.6 kg)  05/04/23 184 lb 8 oz (83.7 kg)    Physical Exam Vitals and nursing note reviewed.  Constitutional:      General: He is not in acute distress.    Appearance: Normal appearance. He is not ill-appearing, toxic-appearing or diaphoretic.  HENT:     Head: Normocephalic and atraumatic.     Right Ear: External ear normal.     Left Ear: External ear normal.     Nose: Nose normal.     Mouth/Throat:     Mouth: Mucous  membranes are moist.     Pharynx: Oropharynx is clear.  Eyes:     General: No scleral icterus.       Right eye: No discharge.        Left eye: No discharge.     Extraocular Movements: Extraocular movements intact.     Conjunctiva/sclera: Conjunctivae normal.     Pupils: Pupils are equal, round, and reactive to light.  Cardiovascular:     Rate and Rhythm: Normal rate and regular rhythm.     Pulses: Normal pulses.     Heart sounds: Normal heart sounds. No murmur heard.    No friction rub. No gallop.  Pulmonary:     Effort: Pulmonary effort is normal. No respiratory distress.     Breath sounds: Normal breath sounds. No stridor. No wheezing, rhonchi or rales.  Chest:     Chest wall: No tenderness.  Musculoskeletal:        General: Normal range of motion.     Cervical back: Normal range of motion and neck supple.  Skin:    General: Skin is warm and dry.     Capillary Refill: Capillary refill takes less than 2 seconds.     Coloration: Skin is not jaundiced or pale.     Findings: No bruising, erythema, lesion or rash.  Neurological:     General: No focal deficit present.     Mental Status: He is alert and oriented to person, place, and time. Mental status is at baseline.  Psychiatric:        Mood and Affect: Mood normal.        Behavior: Behavior normal.        Thought Content: Thought content normal.        Judgment: Judgment normal.     Results for orders placed or performed in visit on 08/05/23  Bayer DCA Hb A1c Waived  Result Value Ref Range   HB A1C (BAYER DCA - WAIVED) 9.3 (H) 4.8 - 5.6 %      Assessment & Plan:   Problem List Items Addressed This Visit       Endocrine   Uncontrolled type 2 diabetes mellitus with hyperglycemia, without long-term current use of insulin (HCC) - Primary    Improved with A1c of 9.3 down from 9.9. Will continue current regimen and recheck in 6 weeks. May need to add jardiance. Recheck 6 weeks. Call with any concerns.       Relevant  Medications   Semaglutide (RYBELSUS) 7 MG TABS   Other Relevant Orders   Bayer DCA Hb A1c Waived (Completed)   CBC with Differential/Platelet   Comprehensive metabolic panel   Lipid Panel w/o Chol/HDL Ratio     Follow up plan: Return in 6 weeks (on 09/16/2023) for with me (before October) also 10/30 with me and for eye exam.

## 2023-08-06 ENCOUNTER — Other Ambulatory Visit: Payer: Self-pay | Admitting: Family Medicine

## 2023-08-06 DIAGNOSIS — E1165 Type 2 diabetes mellitus with hyperglycemia: Secondary | ICD-10-CM

## 2023-08-06 DIAGNOSIS — N1832 Chronic kidney disease, stage 3b: Secondary | ICD-10-CM

## 2023-08-06 LAB — COMPREHENSIVE METABOLIC PANEL
ALT: 21 IU/L (ref 0–44)
AST: 16 IU/L (ref 0–40)
Albumin: 4.1 g/dL (ref 3.9–4.9)
Alkaline Phosphatase: 104 IU/L (ref 44–121)
BUN/Creatinine Ratio: 16 (ref 10–24)
BUN: 35 mg/dL — ABNORMAL HIGH (ref 8–27)
Bilirubin Total: 0.5 mg/dL (ref 0.0–1.2)
CO2: 20 mmol/L (ref 20–29)
Calcium: 8.7 mg/dL (ref 8.6–10.2)
Chloride: 103 mmol/L (ref 96–106)
Creatinine, Ser: 2.15 mg/dL — ABNORMAL HIGH (ref 0.76–1.27)
Globulin, Total: 2.5 g/dL (ref 1.5–4.5)
Glucose: 270 mg/dL — ABNORMAL HIGH (ref 70–99)
Potassium: 4.9 mmol/L (ref 3.5–5.2)
Sodium: 136 mmol/L (ref 134–144)
Total Protein: 6.6 g/dL (ref 6.0–8.5)
eGFR: 33 mL/min/{1.73_m2} — ABNORMAL LOW (ref >59)

## 2023-08-06 LAB — CBC WITH DIFFERENTIAL/PLATELET
Basophils Absolute: 0.1 10*3/uL (ref 0.0–0.2)
Basos: 1 %
EOS (ABSOLUTE): 0.2 10*3/uL (ref 0.0–0.4)
Eos: 2 %
Hematocrit: 34.3 % — ABNORMAL LOW (ref 37.5–51.0)
Hemoglobin: 11.6 g/dL — ABNORMAL LOW (ref 13.0–17.7)
Immature Grans (Abs): 0 10*3/uL (ref 0.0–0.1)
Immature Granulocytes: 0 %
Lymphocytes Absolute: 2.4 10*3/uL (ref 0.7–3.1)
Lymphs: 26 %
MCH: 29.4 pg (ref 26.6–33.0)
MCHC: 33.8 g/dL (ref 31.5–35.7)
MCV: 87 fL (ref 79–97)
Monocytes Absolute: 0.7 10*3/uL (ref 0.1–0.9)
Monocytes: 8 %
Neutrophils Absolute: 6 10*3/uL (ref 1.4–7.0)
Neutrophils: 63 %
Platelets: 207 10*3/uL (ref 150–450)
RBC: 3.95 x10E6/uL — ABNORMAL LOW (ref 4.14–5.80)
RDW: 14.1 % (ref 11.6–15.4)
WBC: 9.4 10*3/uL (ref 3.4–10.8)

## 2023-08-06 LAB — LIPID PANEL W/O CHOL/HDL RATIO
Cholesterol, Total: 187 mg/dL (ref 100–199)
HDL: 35 mg/dL — ABNORMAL LOW (ref >39)
LDL Chol Calc (NIH): 77 mg/dL (ref 0–99)
Triglycerides: 470 mg/dL — ABNORMAL HIGH (ref 0–149)
VLDL Cholesterol Cal: 75 mg/dL — ABNORMAL HIGH (ref 5–40)

## 2023-08-24 ENCOUNTER — Other Ambulatory Visit: Payer: Medicare HMO

## 2023-08-24 NOTE — Progress Notes (Signed)
   08/24/2023  Patient ID: Lucas Weiss, male   DOB: [birth year], 67 y.o.   MRN: [id number]  Unsuccessful outreach attempt to check on home BG and BP.  Called twice but did not get an answer, and the patient does not have voicemail set up.  I will attempt to call again next Monday, 9/9.  Lenna Gilford, PharmD, DPLA

## 2023-08-30 ENCOUNTER — Other Ambulatory Visit: Payer: Medicare HMO

## 2023-08-30 ENCOUNTER — Other Ambulatory Visit: Payer: Self-pay | Admitting: Family Medicine

## 2023-08-30 MED ORDER — DAPAGLIFLOZIN PROPANEDIOL 10 MG PO TABS: 10.0000 mg | ORAL_TABLET | Freq: Every day | ORAL | Status: DC

## 2023-08-30 NOTE — Progress Notes (Signed)
   08/30/2023  Patient ID: Lucas Weiss, male   DOB: 03-Nov-1956, 67 y.o.   MRN: [id number]  S/O Telephone follow-up visit to check on diabetes control  Diabetes Management  -Patient picked up Rybelsus 14mg  from [facility] provided by [employer] PAP on 08/03/23 -He endorses taking this up until 4 days ago.  He stopped taking because he was waking up in the night with severe dry mouth and was experiencing black stools -He states he tolerated 7mg  dose well, but prefers to stop medication all together -Restarted glipizide 5mg  BID and states he is tolerating well, not having the skin irritation he was previously.  He now believes this may have been related to bug spray he was also using at the time. -He continues to take metformin XR 500mg  daily -Saw Dr. Laural Benes 8/15, and A1c was slightly lower at 9.3; but his renal function has declined (eGFR 33 from 50 in June) -Patient states FBG is usually around 110-120 but post-prandial values are >/=250 -Discussed importance of glucose control to prevent further kidney decline and also possible initiation of SGLT2 therapy if he will no longer be taking Rybelsus  A/P  Diabetes Management -Currently uncontrolled -He sees Dr. Laural Benes again 9/24 -He is not open to going back to Rybelsus 7mg  at this time, but he is open to SGLT2 therapy -I recommend starting Farxiga 10mg  daily.  Patient would qualify for AZ&Me PAP for this medication. -He will likely need additional therapy, but this can be determined after a follow-up A1c around 11/15  Follow-up:  Not scheduled at this time but will based on medication changes after consulting with Dr. Fannie Knee, PharmD, DPLA

## 2023-08-31 ENCOUNTER — Telehealth: Payer: Self-pay

## 2023-08-31 NOTE — Progress Notes (Signed)
   08/31/2023  Patient ID: Lucas Weiss, male   DOB: Oct 25, 1956, 67 y.o.   MRN: [id number]  Dr. Laural Benes agrees with stopping Rybelsus and initiating Farxiga 10mg  daily.  I am contacting [employer] to make sure no additional Rybelsus is sent for the patient.  I will also coordinate with the medication assistance team to initiate PAP application for Farxiga 10mg  through [employer]Me PAP.  I will notify patient, and let him know that the office does not currently have any Farxiga samples on hand.  Lenna Gilford, PharmD, DPLA

## 2023-08-31 NOTE — Progress Notes (Signed)
   08/31/2023  Patient ID: Lucas Weiss, male   DOB: 01-Aug-1956, 67 y.o.   MRN: [id number]  Dr. Laural Benes agrees with plan to stop Rybelsus and start Farxiga 10mg  daily.  -Contacted [employer] PAP to make sure no future shipments of Rybelsus are shipped for the patient -Contacted patient to make him aware and let him know we will be working on Farxiga 10mg  PAP for him, too; but the office does not have any samples that he can start in the meantime  Lenna Gilford, PharmD, DPLA

## 2023-09-01 ENCOUNTER — Telehealth: Payer: Self-pay

## 2023-09-01 NOTE — Telephone Encounter (Signed)
-----   Message from Lenna Gilford sent at 08/31/2023  3:13 PM EDT ----- Dr. Laural Benes would like to stop Rybelsus and start Farxiga 10mg  daily.  I will contact [employer] to make sure no further shipments of Rybelsus are sent for the patient.  He would qualify for AZ&Me PAP for Farxiga 10mg  daily; could you all initiate this application process when you are able, please?  Thank you!

## 2023-09-01 NOTE — Telephone Encounter (Signed)
 PAP: PAP application for Farxiga, Publishing rights manager (AZ&Me)) has been mailed to pt's home address on file. Will fax provider portion of application to provider's office when pt's portion is received.    PLEASE BE ADVISED

## 2023-09-09 ENCOUNTER — Other Ambulatory Visit: Payer: Medicare HMO

## 2023-09-09 ENCOUNTER — Other Ambulatory Visit: Payer: Self-pay

## 2023-09-09 NOTE — Patient Instructions (Signed)
Visit Information  Thank you for taking time to visit with me today. Please don't hesitate to contact me if I can be of assistance to you before our next scheduled telephone appointment.  Following are the goals we discussed today:   Goals Addressed             This Visit's Progress    RNCM Care Management Expected Outcome:  Monitor, Self-Manage and Reduce Symptoms of Diabetes       Current Barriers:  Knowledge Deficits related to the importance of checking blood sugars and recording and monitoring for spikes and normals in blood sugar readings and the importance of effective management of DM Care Coordination needs related to food resources for healthy eating  in a patient with DM and financial restrictions.  Chronic Disease Management support and education needs related to effective management of DM Financial Constraints.  Lab Results  Component Value Date   HGBA1C 9.3 (H) 08/05/2023   Previous 9.9  Planned Interventions: Provided education to patient about basic DM disease process. The patient states his blood sugars have been up and down. The patients A1C trending down. The patient states that he had to stop taking some of the medications but his blood sugars have been doing okay.; Reviewed medications with patient and discussed importance of medication adherence. Working with pharm D for PAP with medications. Is taking his medications as ordered. The patient cannot tolerate the Rybelsus and is no longer taking. He goes to the office next week to see the pcp. Have sent an in basket message to the office manager, staff, and pharm D to see if someone can assist him with filling out the paperwork for [place] assistance.Since his stroke he has a hard time getting paperwork filled out. His granddaughter helps him a lot but she is in college and he does not have anyone else that can do this. Collaboration with the pharm D and if there is not availability in the office, she will go to the  office and assist the patient.  Reviewed prescribed diet with patient heart healthy/ADA diet. Education given. The patient is compliant with dietary restrictions. He states he is being more mindful of his dietary restrictions. ; Counseled on importance of regular laboratory monitoring as prescribed. Has labs on a regular basis. The patient had lab work recently and will have new labs in November.;        Discussed plans with patient for ongoing care management follow up and provided patient with direct contact information for care management team;      Provided patient with written educational materials related to hypo and hyperglycemia and importance of correct treatment. Lowest he has seen is 110 and highest around 160;       Reviewed scheduled/upcoming provider appointments including: 09-14-2023 at 4 pm with pcp;         Advised patient, providing education and rationale, to check cbg once daily, when you have symptoms of low or high blood sugar, and encouraged the patient to take his blood sugars on a more consistent basis  and record. The patient has been taking his blood sugars more consistently. The patient feels his blood sugars are doing better. He is feeling better since he is not taking the medications that was making him so sick.  call provider for findings outside established parameters;       Referral made to pharmacy team for assistance with ongoing support and education for effective management of DM and other chronic conditions.  Works with pharm D on a regular basis;       Referral made to community resources care guide team for assistance with food resources for healthy eating and resources to help the patient with cost effective food options ;      Review of patient status, including review of consultants reports, relevant laboratory and other test results, and medications completed;       Advised patient to discuss changes in DM, medication changes, and other question/concerns related  to managing DM with provider;      Screening for signs and symptoms of depression related to chronic disease state;        Assessed social determinant of health barriers;         Symptom Management: Take medications as prescribed   Attend all scheduled provider appointments Call provider office for new concerns or questions  call the Suicide and Crisis Lifeline: 988 call the [place] National Suicide Prevention Lifeline: (916)283-0399 or TTY: 364-557-8655 TTY 248-417-9361) to talk to a trained counselor call 1-800-273-TALK (toll free, 24 hour hotline) if experiencing a Mental Health or Behavioral Health Crisis  check blood sugar at prescribed times: once daily, when you have symptoms of low or high blood sugar, and check blood sugars more consistently. The patient does not always check as he should  check feet daily for cuts, sores or redness trim toenails straight across fill half of plate with vegetables limit fast food meals to no more than 1 per week prepare main meal at home 3 to 5 days each week read food labels for fat, fiber, carbohydrates and portion size keep feet up while sitting wash and dry feet carefully every day wear comfortable, cotton socks wear comfortable, well-fitting shoes  Follow Up Plan: Telephone follow up appointment with care management team member scheduled for: 10-14-2023 at 345 pm       RNCM Care Management Expected Outcome:  Monitor, Self-Manage, and Reduce Symptoms of Hypertension       Current Barriers:  Care Coordination needs related to food resources and help with cost constraints of food in a patient with HTN and other chronic conditions Chronic Disease Management support and education needs related to effective management of HTN Financial Constraints.    BP Readings from Last 3 Encounters:  08/05/23 132/88  06/03/23 (!) 142/71  05/04/23 129/77    Planned Interventions: Evaluation of current treatment plan related to hypertension self  management and patient's adherence to plan as established by provider. The patient has had blood pressures up and down. Denies any acute changes in his blood pressures. Feels overall he is doing well. Denies any acute changes. ;   Provided education to patient re: stroke prevention, s/s of heart attack and stroke; Reviewed prescribed diet heart healthy/ADA diet. Has talked with care guides for resources to help with food. The patient has some paperwork to fill out. Education provided.  Reviewed medications with patient and discussed importance of compliance. Takes medications as directed. Works with pharm D ;  Discussed plans with patient for ongoing care management follow up and provided patient with direct contact information for care management team; Advised patient, providing education and rationale, to monitor blood pressure daily and record, calling PCP for findings outside established parameters;  Reviewed scheduled/upcoming provider appointments including: 09-14-2023 with pcp Advised patient to discuss changes in HTN and heart health with provider; Provided education on prescribed diet heart healthy/ADA diet ;  Discussed complications of poorly controlled blood pressure such as heart disease, stroke,  circulatory complications, vision complications, kidney impairment, sexual dysfunction;  Screening for signs and symptoms of depression related to chronic disease state;  Assessed social determinant of health barriers;   Symptom Management: Take medications as prescribed   Attend all scheduled provider appointments Call provider office for new concerns or questions  call the Suicide and Crisis Lifeline: 988 call the [place] National Suicide Prevention Lifeline: (317) 202-3193 or TTY: (830)062-3950 TTY 509-588-4503) to talk to a trained counselor call 1-800-273-TALK (toll free, 24 hour hotline) if experiencing a Mental Health or Behavioral Health Crisis  check blood pressure weekly learn about  high blood pressure call doctor for signs and symptoms of high blood pressure develop an action plan for high blood pressure keep all doctor appointments take medications for blood pressure exactly as prescribed report new symptoms to your doctor  Follow Up Plan: Telephone follow up appointment with care management team member scheduled for: 10-14-2023 at 345 pm           Our next appointment is by telephone on 10-14-2023 at 345 pm   Please call the care guide team at [phone] if you need to cancel or reschedule your appointment.   If you are experiencing a Mental Health or Behavioral Health Crisis or need someone to talk to, please call the Suicide and Crisis Lifeline: 988 call the [place] National Suicide Prevention Lifeline: (412)492-8465 or TTY: 657 769 4739 TTY (561)026-5204) to talk to a trained counselor call 1-800-273-TALK (toll free, 24 hour hotline)   The patient verbalized understanding of instructions, educational materials, and care plan provided today and DECLINED offer to receive copy of patient instructions, educational materials, and care plan.   Telephone follow up appointment with care management team member scheduled for: 10-14-2023 at 345 pm  Alto Denver RN, MSN, CCM RN Care Manager  [facility]  Ambulatory Care Management  Direct Number: [phone]

## 2023-09-09 NOTE — Patient Outreach (Signed)
Care Management   Visit Note  09/09/2023 Name: Lucas Weiss MRN: [id number] DOB: [birth year]  Subjective: Lucas Weiss is a 67 y.o. year old male who is a primary care patient of Dorcas Carrow, DO. The Care Management team was consulted for assistance.      Engaged with patient spoke with patient by telephone.    Goals Addressed             This Visit's Progress    RNCM Care Management Expected Outcome:  Monitor, Self-Manage and Reduce Symptoms of Diabetes       Current Barriers:  Knowledge Deficits related to the importance of checking blood sugars and recording and monitoring for spikes and normals in blood sugar readings and the importance of effective management of DM Care Coordination needs related to food resources for healthy eating  in a patient with DM and financial restrictions.  Chronic Disease Management support and education needs related to effective management of DM Financial Constraints.  Lab Results  Component Value Date   HGBA1C 9.3 (H) 08/05/2023   Previous 9.9  Planned Interventions: Provided education to patient about basic DM disease process. The patient states his blood sugars have been up and down. The patients A1C trending down. The patient states that he had to stop taking some of the medications but his blood sugars have been doing okay.; Reviewed medications with patient and discussed importance of medication adherence. Working with pharm D for PAP with medications. Is taking his medications as ordered. The patient cannot tolerate the Rybelsus and is no longer taking. He goes to the office next week to see the pcp. Have sent an in basket message to the office manager, staff, and pharm D to see if someone can assist him with filling out the paperwork for [place] assistance.Since his stroke he has a hard time getting paperwork filled out. His granddaughter helps him a lot but she is in college and he does not have anyone else that can do this.  Collaboration with the pharm D and if there is not availability in the office, she will go to the office and assist the patient.  Reviewed prescribed diet with patient heart healthy/ADA diet. Education given. The patient is compliant with dietary restrictions. He states he is being more mindful of his dietary restrictions. ; Counseled on importance of regular laboratory monitoring as prescribed. Has labs on a regular basis. The patient had lab work recently and will have new labs in November.;        Discussed plans with patient for ongoing care management follow up and provided patient with direct contact information for care management team;      Provided patient with written educational materials related to hypo and hyperglycemia and importance of correct treatment. Lowest he has seen is 110 and highest around 160;       Reviewed scheduled/upcoming provider appointments including: 09-14-2023 at 4 pm with pcp;         Advised patient, providing education and rationale, to check cbg once daily, when you have symptoms of low or high blood sugar, and encouraged the patient to take his blood sugars on a more consistent basis  and record. The patient has been taking his blood sugars more consistently. The patient feels his blood sugars are doing better. He is feeling better since he is not taking the medications that was making him so sick.  call provider for findings outside established parameters;       Referral  made to pharmacy team for assistance with ongoing support and education for effective management of DM and other chronic conditions. Works with pharm D on a regular basis;       Referral made to community resources care guide team for assistance with food resources for healthy eating and resources to help the patient with cost effective food options ;      Review of patient status, including review of consultants reports, relevant laboratory and other test results, and medications completed;        Advised patient to discuss changes in DM, medication changes, and other question/concerns related to managing DM with provider;      Screening for signs and symptoms of depression related to chronic disease state;        Assessed social determinant of health barriers;         Symptom Management: Take medications as prescribed   Attend all scheduled provider appointments Call provider office for new concerns or questions  call the Suicide and Crisis Lifeline: 988 call the [place] National Suicide Prevention Lifeline: (971)813-6431 or TTY: (930)482-2527 TTY (667)269-9469) to talk to a trained counselor call 1-800-273-TALK (toll free, 24 hour hotline) if experiencing a Mental Health or Behavioral Health Crisis  check blood sugar at prescribed times: once daily, when you have symptoms of low or high blood sugar, and check blood sugars more consistently. The patient does not always check as he should  check feet daily for cuts, sores or redness trim toenails straight across fill half of plate with vegetables limit fast food meals to no more than 1 per week prepare main meal at home 3 to 5 days each week read food labels for fat, fiber, carbohydrates and portion size keep feet up while sitting wash and dry feet carefully every day wear comfortable, cotton socks wear comfortable, well-fitting shoes  Follow Up Plan: Telephone follow up appointment with care management team member scheduled for: 10-14-2023 at 345 pm       RNCM Care Management Expected Outcome:  Monitor, Self-Manage, and Reduce Symptoms of Hypertension       Current Barriers:  Care Coordination needs related to food resources and help with cost constraints of food in a patient with HTN and other chronic conditions Chronic Disease Management support and education needs related to effective management of HTN Financial Constraints.    BP Readings from Last 3 Encounters:  08/05/23 132/88  06/03/23 (!) 142/71  05/04/23 129/77     Planned Interventions: Evaluation of current treatment plan related to hypertension self management and patient's adherence to plan as established by provider. The patient has had blood pressures up and down. Denies any acute changes in his blood pressures. Feels overall he is doing well. Denies any acute changes. ;   Provided education to patient re: stroke prevention, s/s of heart attack and stroke; Reviewed prescribed diet heart healthy/ADA diet. Has talked with care guides for resources to help with food. The patient has some paperwork to fill out. Education provided.  Reviewed medications with patient and discussed importance of compliance. Takes medications as directed. Works with pharm D ;  Discussed plans with patient for ongoing care management follow up and provided patient with direct contact information for care management team; Advised patient, providing education and rationale, to monitor blood pressure daily and record, calling PCP for findings outside established parameters;  Reviewed scheduled/upcoming provider appointments including: 09-14-2023 with pcp Advised patient to discuss changes in HTN and heart health with provider; Provided education  on prescribed diet heart healthy/ADA diet ;  Discussed complications of poorly controlled blood pressure such as heart disease, stroke, circulatory complications, vision complications, kidney impairment, sexual dysfunction;  Screening for signs and symptoms of depression related to chronic disease state;  Assessed social determinant of health barriers;   Symptom Management: Take medications as prescribed   Attend all scheduled provider appointments Call provider office for new concerns or questions  call the Suicide and Crisis Lifeline: 988 call the [place] National Suicide Prevention Lifeline: 743 655 4817 or TTY: (410)222-1198 TTY (720)846-9240) to talk to a trained counselor call 1-800-273-TALK (toll free, 24 hour hotline) if  experiencing a Mental Health or Behavioral Health Crisis  check blood pressure weekly learn about high blood pressure call doctor for signs and symptoms of high blood pressure develop an action plan for high blood pressure keep all doctor appointments take medications for blood pressure exactly as prescribed report new symptoms to your doctor  Follow Up Plan: Telephone follow up appointment with care management team member scheduled for: 10-14-2023 at 345 pm           Consent to Services:  Patient was given information about care management services, agreed to services, and gave verbal consent to participate.   Plan: Telephone follow up appointment with care management team member scheduled for: 10-14-2023 at 345 pm  Alto Denver RN, MSN, CCM RN Care Manager  [facility]  Ambulatory Care Management  Direct Number: [phone]

## 2023-09-14 ENCOUNTER — Ambulatory Visit (INDEPENDENT_AMBULATORY_CARE_PROVIDER_SITE_OTHER): Payer: Medicare HMO | Admitting: Family Medicine

## 2023-09-14 ENCOUNTER — Encounter: Payer: Self-pay | Admitting: Family Medicine

## 2023-09-14 VITALS — BP 129/68 | HR 65 | Ht 69.0 in | Wt 180.0 lb

## 2023-09-14 DIAGNOSIS — E1165 Type 2 diabetes mellitus with hyperglycemia: Secondary | ICD-10-CM | POA: Diagnosis not present

## 2023-09-14 DIAGNOSIS — Z0000 Encounter for general adult medical examination without abnormal findings: Secondary | ICD-10-CM

## 2023-09-14 DIAGNOSIS — Z23 Encounter for immunization: Secondary | ICD-10-CM | POA: Diagnosis not present

## 2023-09-14 DIAGNOSIS — Z7984 Long term (current) use of oral hypoglycemic drugs: Secondary | ICD-10-CM | POA: Diagnosis not present

## 2023-09-14 NOTE — Assessment & Plan Note (Signed)
Will recheck labs today. Unable to tolerate rybelsus. He does not want to start ozempic of rybelsus at this time. Will start him on farxiga and recheck in 3 months, if sugars increase, consider adding ozempic. Call with any concerns.

## 2023-09-14 NOTE — Patient Instructions (Signed)
  Preventative Services:  Health Risk Assessment and Personalized Prevention Plan: Done today Bone Mass Measurements: N/A CVD Screening: Up to date Colon Cancer Screening: up to date Depression Screening: Done today Diabetes Screening: Up to date Glaucoma Screening: see your eye doctor Hepatitis B vaccine: N/A Hepatitis C screening: up to date HIV Screening: up to date Flu Vaccine: Given today Lung cancer Screening: N/A Obesity Screening: Done today Pneumonia Vaccines: up to date STI Screening: N/A PSA screening: up to date

## 2023-09-14 NOTE — Progress Notes (Signed)
BP 129/68   Pulse 65   Ht 5\' 9"  (1.753 m)   Wt 180 lb (81.6 kg)   SpO2 98%   BMI 26.58 kg/m    Subjective:    Patient ID: Lucas Weiss, male    DOB: [birth year], 67 y.o.   MRN: [id number]  HPI: Lucas Weiss is a 67 y.o. male presenting on 09/14/2023 for comprehensive medical examination. Current medical complaints include:  DIABETES- stopped his rybelsus due to GI upset aboout 2 weeks ago Hypoglycemic episodes:no Polydipsia/polyuria: no Visual disturbance: no Chest pain: no Paresthesias: yes Glucose Monitoring: yes  Accucheck frequency: Daily Taking Insulin?: no Blood Pressure Monitoring: not checking Retinal Examination: Not up to Date Foot Exam: Up to Date Diabetic Education: Completed Pneumovax: Up to Date Influenza: Given today Aspirin: no  He currently lives with: alone Interim Problems from his last visit: no  Functional Status Survey: Is the patient deaf or have difficulty hearing?: Yes Does the patient have difficulty seeing, even when wearing glasses/contacts?: Yes Does the patient have difficulty concentrating, remembering, or making decisions?: No Does the patient have difficulty walking or climbing stairs?: No Does the patient have difficulty dressing or bathing?: No Does the patient have difficulty doing errands alone such as visiting a doctor's office or shopping?: No  FALL RISK:    09/02/2022    1:17 PM 10/22/2020   10:34 AM 08/30/2020    3:30 PM 07/31/2019    1:02 PM 02/14/2019    1:57 PM  Fall Risk   Falls in the past year? 1 1 0 0 0  Number falls in past yr: 0 0     Injury with Fall? 0 1     Risk for fall due to : History of fall(s)  Medication side effect    Follow up   Falls evaluation completed;Education provided;Falls prevention discussed  Falls evaluation completed    Depression Screen    09/14/2023    4:12 PM 07/05/2023   10:52 AM 09/02/2022    1:15 PM 05/01/2022    3:08 PM 01/30/2022    3:20 PM  Depression screen PHQ 2/9  Decreased  Interest 0 0 0  0  Down, Depressed, Hopeless 0 0 0  0  PHQ - 2 Score 0 0 0  0  Altered sleeping   2    Tired, decreased energy   2    Change in appetite   0    Feeling bad or failure about yourself    0    Trouble concentrating   0    Moving slowly or fidgety/restless   0    Suicidal thoughts   0    PHQ-9 Score   4    Difficult doing work/chores   Not difficult at all Not difficult at all     Advanced Directives Does patient have a HCPOA?    no If yes, name and contact information:  Does patient have a living will or MOST form?  no  Past Medical History:  Past Medical History:  Diagnosis Date   Alcohol abuse, in remission    Avascular necrosis of bone of left hip (HCC) 10/15   Total hip replacement recommended by ortho, vascular says it's OK   Avascular necrosis of bone of left hip (HCC)    Total hip replacement recommended by ortho, vascular says he can have it done.     Bleeding hemorrhoids    Broken ribs 09/27/2020   CHF (congestive heart failure) (HCC)  Chronic low back pain    Complication of anesthesia    "woke up twice during procedures @ [place]" (09/15/2012)   Coronary artery disease    Diabetes mellitus without complication (HCC) 01/09/16   A1c 6.8   Diverticulosis of sigmoid colon    DVT (deep venous thrombosis) (HCC)    Dyslexia    H/O hiatal hernia    Head injury, acute, with loss of consciousness (HCC)    as a child   High cholesterol    Hypertension    Impaired fasting glucose    Last A1c 6.1   Myocardial infarction [facility])    Neuropathy    PAD (peripheral artery disease) (HCC)    07/2012:  right ABI 0.64, left ABI 0.57.   Pneumonia    history of    Primary osteoarthritis of left hip    Renal artery stenosis (HCC)    Bilateral    Severe recurrent major depression without psychotic features (HCC)    Shortness of breath dyspnea    with exertion - due to pain   Stroke [facility]) 2009   "after CABG; ~ blind left eye since" (09/15/2012)- memory loss     Tobacco abuse    Quit in 2009    Surgical History:  Past Surgical History:  Procedure Laterality Date   2D Echo  02/17/11   EF 50-55%, mild LVH, No WMA, mild MR, mild TR   CARDIAC CATHETERIZATION  ?2009   CARDIOVASCULAR STRESS TEST  08/10/12   Lexiscan: EF 70%, Attenuation artifact in inferior region.  No ischemia or infarct in remaining myocardium. Low risk   COLONOSCOPY W/ POLYPECTOMY     "no cancer"   CORONARY ARTERY BYPASS GRAFT  2009   2009, CABG X5, LIMA to LAD, SVG to Diagnonal, Sequential SVG to OM 2-3, SVG to PDA; Dr. Laneta Simmers   INSERTION OF ILIAC STENT Left 09/15/2012   Procedure: INSERTION OF ILIAC STENT;  Surgeon: [name], MD;  Location: [facility] CATH LAB;  Service: Cardiovascular;  Laterality: Left;  lt iliac stent   LOWER EXTREMITY ANGIOGRAM N/A 09/15/2012   Procedure: LOWER EXTREMITY ANGIOGRAM;  Surgeon: [name], MD;  Location: [facility] CATH LAB;  Service: Cardiovascular;  Laterality: N/A;   PERIPHERAL ARTERIAL STENT GRAFT Left 06/20/2012; ~ 07/2012; 09/15/2012   left; left; left   PV angiogram  09/15/12   restenting of left common iliac for ISRS.  Bilateral occluded SFAs.  Renal artery stenosis: 70% right inferior renal artery, 90% left inferior renal artery stenosis.    TOTAL HIP ARTHROPLASTY Left 05/19/2016   Procedure: LEFT TOTAL HIP ARTHROPLASTY ANTERIOR APPROACH;  Surgeon: Kathryne Hitch, MD;  Location: [facility] OR;  Service: Orthopedics;  Laterality: Left;    Medications:  Current Outpatient Medications on File Prior to Visit  Medication Sig   amLODipine (NORVASC) 5 MG tablet Take 1 tablet (5 mg total) by mouth daily.   aspirin EC 81 MG tablet Take 81 mg by mouth daily.   Blood Glucose Monitoring Suppl (ONETOUCH VERIO) w/Device KIT To check blood sugar twice a day and document, bring to provider visits.   dapagliflozin propanediol (FARXIGA) 10 MG TABS tablet Take 1 tablet (10 mg total) by mouth daily before breakfast.   glipiZIDE (GLUCOTROL) 5 MG tablet Take 5 mg  by mouth 2 (two) times daily before a meal.   glucose blood (ONETOUCH VERIO) test strip To check blood sugar twice a day and document, bring to provider visits.   Lancets (ONETOUCH ULTRASOFT) lancets To  check blood sugar twice a day and document, bring to provider visits.   lisinopril (ZESTRIL) 40 MG tablet Take 1 tablet (40 mg total) by mouth daily.   lovastatin (MEVACOR) 40 MG tablet Take 1 tablet (40 mg total) by mouth at bedtime.   metFORMIN (GLUCOPHAGE-XR) 500 MG 24 hr tablet Take 1 tablet (500 mg total) by mouth daily with breakfast.   metoprolol tartrate (LOPRESSOR) 50 MG tablet Take 1 tablet (50 mg total) by mouth daily.   No current facility-administered medications on file prior to visit.    Allergies:  Allergies  Allergen Reactions   Duloxetine Other (See Comments)    Irritability   Glipizide Hives    Social History:  Social History   Socioeconomic History   Marital status: Divorced    Spouse name: Not on file   Number of children: Not on file   Years of education: Not on file   Highest education level: 11th grade  Occupational History   Occupation: disability  Tobacco Use   Smoking status: Former    Current packs/day: 0.00    Average packs/day: 1.5 packs/day for 30.0 years (45.0 ttl pk-yrs)    Types: Cigarettes    Start date: 07/19/1978    Quit date: 07/19/2008    Years since quitting: 15.1   Smokeless tobacco: Never  Vaping Use   Vaping status: Never Used  Substance and Sexual Activity   Alcohol use: No    Comment: Former Alcoholic, not drinking anymore. 09/15/2012 "3-4 beers; shot of crown @ least 2 nights/wk"   Drug use: No   Sexual activity: Not Currently  Other Topics Concern   Not on file  Social History Narrative   Not on file   Social Determinants of Health   Financial Resource Strain: High Risk (07/05/2023)   Overall Financial Resource Strain (CARDIA)    Difficulty of Paying Living Expenses: Very hard  Food Insecurity: Food Insecurity Present  (07/05/2023)   Hunger Vital Sign    Worried About Running Out of Food in the Last Year: Sometimes true    Ran Out of Food in the Last Year: Sometimes true  Transportation Needs: No Transportation Needs (07/05/2023)   PRAPARE - Administrator, Civil Service (Medical): No    Lack of Transportation (Non-Medical): No  Physical Activity: Inactive (07/05/2023)   Exercise Vital Sign    Days of Exercise per Week: 0 days    Minutes of Exercise per Session: 0 min  Stress: Stress Concern Present (07/05/2023)   [employer] of Occupational Health - Occupational Stress Questionnaire    Feeling of Stress : To some extent  Social Connections: Moderately Isolated (07/05/2023)   Social Connection and Isolation Panel [NHANES]    Frequency of Communication with Friends and Family: More than three times a week    Frequency of Social Gatherings with Friends and Family: Three times a week    Attends Religious Services: More than 4 times per year    Active Member of Clubs or Organizations: No    Attends Banker Meetings: Never    Marital Status: Divorced  Catering manager Violence: Not At Risk (07/05/2023)   Humiliation, Afraid, Rape, and Kick questionnaire    Fear of Current or Ex-Partner: No    Emotionally Abused: No    Physically Abused: No    Sexually Abused: No   Social History   Tobacco Use  Smoking Status Former   Current packs/day: 0.00   Average packs/day: 1.5 packs/day for  30.0 years (45.0 ttl pk-yrs)   Types: Cigarettes   Start date: 07/19/1978   Quit date: 07/19/2008   Years since quitting: 15.1  Smokeless Tobacco Never   Social History   Substance and Sexual Activity  Alcohol Use No   Comment: Former Alcoholic, not drinking anymore. 09/15/2012 "3-4 beers; shot of crown @ least 2 nights/wk"    Family History:  Family History  Problem Relation Age of Onset   Diabetes Mother    Heart disease Mother    Hyperlipidemia Mother    Hypertension Mother    Heart  attack Mother    Thyroid disease Mother    Hyperlipidemia Father    Hypertension Father    Heart attack Father    Heart disease Father        before age 10   Alcohol abuse Father    Lung disease Father    Diabetes Sister    Hyperlipidemia Sister    Hypertension Sister    Cancer Sister    Heart disease Sister    Hypertension Sister    Hyperlipidemia Sister    Heart disease Brother        before age 59   Hyperlipidemia Brother    Hypertension Brother    Heart attack Brother    Diabetes Brother    Heart disease Brother    Hyperlipidemia Brother    Hypertension Brother    Stroke Brother    Heart attack Brother     Past medical history, surgical history, medications, allergies, family history and social history reviewed with patient today and changes made to appropriate areas of the chart.   Review of Systems  Constitutional: Negative.   Respiratory: Negative.    Cardiovascular: Negative.   Genitourinary: Negative.   Musculoskeletal: Negative.   Neurological: Negative.   Psychiatric/Behavioral: Negative.     All other ROS negative except what is listed above and in the HPI.      Objective:    BP 129/68   Pulse 65   Ht 5\' 9"  (1.753 m)   Wt 180 lb (81.6 kg)   SpO2 98%   BMI 26.58 kg/m   Wt Readings from Last 3 Encounters:  09/14/23 180 lb (81.6 kg)  08/05/23 179 lb 12.8 oz (81.6 kg)  06/03/23 182 lb 3.2 oz (82.6 kg)     Physical Exam Vitals and nursing note reviewed.  Constitutional:      General: He is not in acute distress.    Appearance: Normal appearance. He is not ill-appearing, toxic-appearing or diaphoretic.  HENT:     Head: Normocephalic and atraumatic.     Right Ear: External ear normal.     Left Ear: External ear normal.     Nose: Nose normal.     Mouth/Throat:     Mouth: Mucous membranes are moist.     Pharynx: Oropharynx is clear.  Eyes:     General: No scleral icterus.       Right eye: No discharge.        Left eye: No discharge.      Extraocular Movements: Extraocular movements intact.     Conjunctiva/sclera: Conjunctivae normal.     Pupils: Pupils are equal, round, and reactive to light.  Cardiovascular:     Rate and Rhythm: Normal rate and regular rhythm.     Pulses: Normal pulses.     Heart sounds: Normal heart sounds. No murmur heard.    No friction rub. No gallop.  Pulmonary:  Effort: Pulmonary effort is normal. No respiratory distress.     Breath sounds: Normal breath sounds. No stridor. No wheezing, rhonchi or rales.  Chest:     Chest wall: No tenderness.  Musculoskeletal:        General: Normal range of motion.     Cervical back: Normal range of motion and neck supple.  Skin:    General: Skin is warm and dry.     Capillary Refill: Capillary refill takes less than 2 seconds.     Coloration: Skin is not jaundiced or pale.     Findings: No bruising, erythema, lesion or rash.  Neurological:     General: No focal deficit present.     Mental Status: He is alert and oriented to person, place, and time. Mental status is at baseline.  Psychiatric:        Mood and Affect: Mood normal.        Behavior: Behavior normal.        Thought Content: Thought content normal.        Judgment: Judgment normal.        09/14/2023    4:12 PM 09/02/2022    1:19 PM 07/25/2018    1:13 PM 07/21/2017    3:34 PM  6CIT Screen  What Year? 0 points 0 points 0 points 0 points  What month? 0 points 0 points 0 points 0 points  What time? 0 points 0 points 0 points 0 points  Count back from 20 0 points 0 points 0 points 0 points  Months in reverse 4 points 4 points 0 points 0 points  Repeat phrase 6 points 10 points 6 points 2 points  Total Score 10 points 14 points 6 points 2 points    Results for orders placed or performed in visit on 08/05/23  Bayer DCA Hb A1c Waived  Result Value Ref Range   HB A1C (BAYER DCA - WAIVED) 9.3 (H) 4.8 - 5.6 %  CBC with Differential/Platelet  Result Value Ref Range   WBC 9.4 3.4 - 10.8  x10E3/uL   RBC 3.95 (L) 4.14 - 5.80 x10E6/uL   Hemoglobin 11.6 (L) 13.0 - 17.7 g/dL   Hematocrit 16.1 (L) 09.6 - 51.0 %   MCV 87 79 - 97 fL   MCH 29.4 26.6 - 33.0 pg   MCHC 33.8 31.5 - 35.7 g/dL   RDW 04.5 40.9 - 81.1 %   Platelets 207 150 - 450 x10E3/uL   Neutrophils 63 Not Estab. %   Lymphs 26 Not Estab. %   Monocytes 8 Not Estab. %   Eos 2 Not Estab. %   Basos 1 Not Estab. %   Neutrophils Absolute 6.0 1.4 - 7.0 x10E3/uL   Lymphocytes Absolute 2.4 0.7 - 3.1 x10E3/uL   Monocytes Absolute 0.7 0.1 - 0.9 x10E3/uL   EOS (ABSOLUTE) 0.2 0.0 - 0.4 x10E3/uL   Basophils Absolute 0.1 0.0 - 0.2 x10E3/uL   Immature Granulocytes 0 Not Estab. %   Immature Grans (Abs) 0.0 0.0 - 0.1 x10E3/uL  Comprehensive metabolic panel  Result Value Ref Range   Glucose 270 (H) 70 - 99 mg/dL   BUN 35 (H) 8 - 27 mg/dL   Creatinine, Ser 9.14 (H) 0.76 - 1.27 mg/dL   eGFR 33 (L) >78 GN/FAO/1.30   BUN/Creatinine Ratio 16 10 - 24   Sodium 136 134 - 144 mmol/L   Potassium 4.9 3.5 - 5.2 mmol/L   Chloride 103 96 - 106 mmol/L   CO2 20  20 - 29 mmol/L   Calcium 8.7 8.6 - 10.2 mg/dL   Total Protein 6.6 6.0 - 8.5 g/dL   Albumin 4.1 3.9 - 4.9 g/dL   Globulin, Total 2.5 1.5 - 4.5 g/dL   Bilirubin Total 0.5 0.0 - 1.2 mg/dL   Alkaline Phosphatase 104 44 - 121 IU/L   AST 16 0 - 40 IU/L   ALT 21 0 - 44 IU/L  Lipid Panel w/o Chol/HDL Ratio  Result Value Ref Range   Cholesterol, Total 187 100 - 199 mg/dL   Triglycerides 578 (H) 0 - 149 mg/dL   HDL 35 (L) >46 mg/dL   VLDL Cholesterol Cal 75 (H) 5 - 40 mg/dL   LDL Chol Calc (NIH) 77 0 - 99 mg/dL      Assessment & Plan:   Problem List Items Addressed This Visit       Endocrine   Uncontrolled type 2 diabetes mellitus with hyperglycemia, without long-term current use of insulin (HCC)    Will recheck labs today. Unable to tolerate rybelsus. He does not want to start ozempic of rybelsus at this time. Will start him on farxiga and recheck in 3 months, if sugars  increase, consider adding ozempic. Call with any concerns.       Relevant Orders   Hgb A1c w/o eAG   Other Visit Diagnoses     Encounter for Medicare annual wellness exam    -  Primary   Preventative care discussed today as below.   Needs flu shot       Flu shot given today.   Relevant Orders   Flu Vaccine Trivalent High Dose (Fluad) (Completed)        Preventative Services:  Health Risk Assessment and Personalized Prevention Plan: Done today Bone Mass Measurements: N/A CVD Screening: Up to date Colon Cancer Screening: up to date Depression Screening: Done today Diabetes Screening: Up to date Glaucoma Screening: see your eye doctor Hepatitis B vaccine: N/A Hepatitis C screening: up to date HIV Screening: up to date Flu Vaccine: Given today Lung cancer Screening: N/A Obesity Screening: Done today Pneumonia Vaccines: up to date STI Screening: N/A PSA screening: up to date  LABORATORY TESTING:  Health maintenance labs ordered today as discussed above.   IMMUNIZATIONS:   - Tdap: Tetanus vaccination status reviewed: last tetanus booster within 10 years. - Influenza: Administered today - Pneumovax: Up to date - Prevnar: Up to date - Zostavax vaccine: Given elsewhere  SCREENING: - Colonoscopy: Refused  Discussed with patient purpose of the colonoscopy is to detect colon cancer at curable precancerous or early stages   PATIENT COUNSELING:    Sexuality: Discussed sexually transmitted diseases, partner selection, use of condoms, avoidance of unintended pregnancy  and contraceptive alternatives.   Advised to avoid cigarette smoking.  I discussed with the patient that most people either abstain from alcohol or drink within safe limits (<=14/week and <=4 drinks/occasion for males, <=7/weeks and <= 3 drinks/occasion for females) and that the risk for alcohol disorders and other health effects rises proportionally with the number of drinks per week and how often a drinker  exceeds daily limits.  Discussed cessation/primary prevention of drug use and availability of treatment for abuse.   Diet: Encouraged to adjust caloric intake to maintain  or achieve ideal body weight, to reduce intake of dietary saturated fat and total fat, to limit sodium intake by avoiding high sodium foods and not adding table salt, and to maintain adequate dietary potassium and calcium preferably from  fresh fruits, vegetables, and low-fat dairy products.    stressed the importance of regular exercise  Injury prevention: Discussed safety belts, safety helmets, smoke detector, smoking near bedding or upholstery.   Dental health: Discussed importance of regular tooth brushing, flossing, and dental visits.   Follow up plan: NEXT PREVENTATIVE PHYSICAL DUE IN 1 YEAR. Return in about 3 months (around 12/14/2023).

## 2023-09-15 LAB — HGB A1C W/O EAG: Hgb A1c MFr Bld: 8.7 % — ABNORMAL HIGH (ref 4.8–5.6)

## 2023-09-16 ENCOUNTER — Encounter: Payer: Self-pay | Admitting: Family Medicine

## 2023-09-16 NOTE — Telephone Encounter (Signed)
FAXED PROVIDER PAGE for FARXIGA([employer]) TO PROVIDER OFFICE OF [facility]   PLEASE BE ADVISED

## 2023-09-30 NOTE — Telephone Encounter (Signed)
PAP: Application for Marcelline Deist has been submitted to PAP Companies: [employer], via fax    PLEASE BE ADVISED

## 2023-10-10 NOTE — Progress Notes (Signed)
10/10/2023  Patient ID: Lucas Weiss, male   DOB: Dec 04, 1956, 67 y.o.   MRN: [id number]  Contacted [employer] to check on processing status of application for Farxiga 10mg  daily.  Patient was approved, and the medication shipped out 10/15.  Will follow-up with patient in 4 weeks to check on tolerance/efficacy.  Lenna Gilford, PharmD, DPLA

## 2023-10-14 ENCOUNTER — Other Ambulatory Visit: Payer: Self-pay

## 2023-10-14 ENCOUNTER — Other Ambulatory Visit: Payer: Self-pay | Admitting: *Deleted

## 2023-10-14 NOTE — Patient Outreach (Signed)
Care Management   Visit Note  10/14/2023 Name: Lucas Weiss MRN: [id number] DOB: 01/12/1956  Subjective: Lucas Weiss is a 67 y.o. year old male who is a primary care patient of Dorcas Carrow, DO. The Care Management team was consulted for assistance.      Engaged with patient spoke with patient by telephone.    Goals Addressed             This Visit's Progress    RNCM Care Management Expected Outcome:  Monitor, Self-Manage and Reduce Symptoms of Diabetes       Current Barriers:  Knowledge Deficits related to the importance of checking blood sugars and recording and monitoring for spikes and normals in blood sugar readings and the importance of effective management of DM Care Coordination needs related to food resources for healthy eating  in a patient with DM and financial restrictions.  Chronic Disease Management support and education needs related to effective management of DM Financial Constraints.  Lab Results  Component Value Date   HGBA1C 8.7 (H) 09/14/2023    Planned Interventions: Provided education to patient about basic DM disease process. The patient states his blood sugars are great in the morning ranging for 110-111. He states that if his blood sugar is within an acceptable range he feels as if he can "cheat" on his meals. RNCM has advised of the importance of maintain a diabetic diet and refraining from excessive carbohydrates and sugar. He was also advised to follow up with Nephrology and he stated he does not wish to continue following up with Nephrology as he feels he does not need another specialist. He states even if it was recommended he would not consider dialysis. He his hopeful that his new medication that he received will help lower his blood sugars and hopefully that will help with his kidney function. He states that he was taking a lot of supplements and that could have been the cause of his elevated kidney function. RNCM attempted to provide education  and patient continued to disregard education being provided. Reviewed medications with patient and discussed importance of medication adherence. Working with pharm D for PAP with medications. Is taking his medications as ordered.  Reviewed prescribed diet with patient heart healthy/ADA diet. Education given. The patient is noncompliant with dietary restrictions.  Counseled on importance of regular laboratory monitoring as prescribed. Has labs on a regular basis. The patient had lab work recently and will have new labs in November.;        Discussed plans with patient for ongoing care management follow up and provided patient with direct contact information for care management team;      Provided patient with written educational materials related to hypo and hyperglycemia and importance of correct treatment. Lowest he has seen is 110 and highest around 160;       Reviewed scheduled/upcoming provider appointments including:12-16-2023 with pcp;         Advised patient, providing education and rationale, to check cbg once daily, when you have symptoms of low or high blood sugar, and encouraged the patient to take his blood sugars on a more consistent basis  and record. The patient has been taking his blood sugars more consistently. The patient feels his blood sugars are doing better. He is feeling better since he is not taking the medications that was making him so sick.  call provider for findings outside established parameters;       Referral made to pharmacy team for  assistance with ongoing support and education for effective management of DM and other chronic conditions. Works with pharm D on a regular basis;       Referral made to community resources care guide team for assistance with food resources for healthy eating and resources to help the patient with cost effective food options ;      Review of patient status, including review of consultants reports, relevant laboratory and other test results, and  medications completed;       Advised patient to discuss changes in DM, medication changes, and other question/concerns related to managing DM with provider;      Screening for signs and symptoms of depression related to chronic disease state;        Assessed social determinant of health barriers;         Symptom Management: Take medications as prescribed   Attend all scheduled provider appointments Call provider office for new concerns or questions  call the Suicide and Crisis Lifeline: 988 call the [place] National Suicide Prevention Lifeline: 218-419-3341 or TTY: 754 677 0675 TTY [phone]) to talk to a trained counselor call 1-800-273-TALK (toll free, 24 hour hotline) if experiencing a Mental Health or Behavioral Health Crisis  check blood sugar at prescribed times: once daily, when you have symptoms of low or high blood sugar, and check blood sugars more consistently. The patient does not always check as he should  check feet daily for cuts, sores or redness trim toenails straight across fill half of plate with vegetables limit fast food meals to no more than 1 per week prepare main meal at home 3 to 5 days each week read food labels for fat, fiber, carbohydrates and portion size keep feet up while sitting wash and dry feet carefully every day wear comfortable, cotton socks wear comfortable, well-fitting shoes  Follow Up Plan: Telephone follow up appointment with care management team member scheduled for: 12-02-2023 at 315 pm       RNCM Care Management Expected Outcome:  Monitor, Self-Manage, and Reduce Symptoms of Hypertension       Current Barriers:  Care Coordination needs related to food resources and help with cost constraints of food in a patient with HTN and other chronic conditions Chronic Disease Management support and education needs related to effective management of HTN Financial Constraints.    BP Readings from Last 3 Encounters:  09/14/23 129/68  08/05/23  132/88  06/03/23 (!) 142/71    Planned Interventions: Evaluation of current treatment plan related to hypertension self management and patient's adherence to plan as established by provider. The patient states that he has not been checking blood pressure regularly. Denies any acute changes. ;   Provided education to patient re: stroke prevention, s/s of heart attack and stroke; Reviewed prescribed diet heart healthy/ADA diet. Has talked with care guides for resources to help with food. The patient has some paperwork to fill out. Education provided.  Reviewed medications with patient and discussed importance of compliance. Takes medications as directed. Works with pharm D ;  Discussed plans with patient for ongoing care management follow up and provided patient with direct contact information for care management team; Advised patient, providing education and rationale, to monitor blood pressure daily and record, calling PCP for findings outside established parameters;  Reviewed scheduled/upcoming provider appointments including: 12-16-2023 with pcp Advised patient to discuss changes in HTN and heart health with provider; Provided education on prescribed diet heart healthy/ADA diet ;  Discussed complications of poorly controlled blood pressure such  as heart disease, stroke, circulatory complications, vision complications, kidney impairment, sexual dysfunction;  Screening for signs and symptoms of depression related to chronic disease state;  Assessed social determinant of health barriers;   Symptom Management: Take medications as prescribed   Attend all scheduled provider appointments Call provider office for new concerns or questions  call the Suicide and Crisis Lifeline: 988 call the [place] National Suicide Prevention Lifeline: (929) 463-6313 or TTY: (913) 746-2525 TTY 510-032-1134) to talk to a trained counselor call 1-800-273-TALK (toll free, 24 hour hotline) if experiencing a Mental Health or  Behavioral Health Crisis  check blood pressure weekly learn about high blood pressure call doctor for signs and symptoms of high blood pressure develop an action plan for high blood pressure keep all doctor appointments take medications for blood pressure exactly as prescribed report new symptoms to your doctor  Follow Up Plan: Telephone follow up appointment with care management team member scheduled for: 12-02-2023 at 315 pm              Consent to Services:  Patient was given information about care management services, agreed to services, and gave verbal consent to participate.   Plan: Telephone follow up appointment with care management team member scheduled for: 12-02-2023 at 3:15 pm  Danise Edge, BSN RN RN Care Manager  [facility]  Ambulatory Care Management  Direct Number: [phone]

## 2023-10-14 NOTE — Patient Instructions (Addendum)
Visit Information  Thank you for taking time to visit with me today. Please don't hesitate to contact me if I can be of assistance to you before our next scheduled telephone appointment.  Following are the goals we discussed today:   Goals Addressed             This Visit's Progress    RNCM Care Management Expected Outcome:  Monitor, Self-Manage and Reduce Symptoms of Diabetes       Current Barriers:  Knowledge Deficits related to the importance of checking blood sugars and recording and monitoring for spikes and normals in blood sugar readings and the importance of effective management of DM Care Coordination needs related to food resources for healthy eating  in a patient with DM and financial restrictions.  Chronic Disease Management support and education needs related to effective management of DM Financial Constraints.  Lab Results  Component Value Date   HGBA1C 8.7 (H) 09/14/2023    Planned Interventions: Provided education to patient about basic DM disease process. The patient states his blood sugars are great in the morning ranging for 110-111. He states that if his blood sugar is within an acceptable range he feels as if he can "cheat" on his meals. RNCM has advised of the importance of maintain a diabetic diet and refraining from excessive carbohydrates and sugar. He was also advised to follow up with Nephrology and he stated he does not wish to continue following up with Nephrology as he feels he does not need another specialist. He states even if it was recommended he would not consider dialysis. He his hopeful that his new medication that he received will help lower his blood sugars and hopefully that will help with his kidney function. He states that he was taking a lot of supplements and that could have been the cause of his elevated kidney function. RNCM attempted to provide education and patient continued to disregard education being provided. Reviewed medications with  patient and discussed importance of medication adherence. Working with pharm D for PAP with medications. Is taking his medications as ordered.  Reviewed prescribed diet with patient heart healthy/ADA diet. Education given. The patient is noncompliant with dietary restrictions.  Counseled on importance of regular laboratory monitoring as prescribed. Has labs on a regular basis. The patient had lab work recently and will have new labs in November.;        Discussed plans with patient for ongoing care management follow up and provided patient with direct contact information for care management team;      Provided patient with written educational materials related to hypo and hyperglycemia and importance of correct treatment. Lowest he has seen is 110 and highest around 160;       Reviewed scheduled/upcoming provider appointments including:12-16-2023 with pcp;         Advised patient, providing education and rationale, to check cbg once daily, when you have symptoms of low or high blood sugar, and encouraged the patient to take his blood sugars on a more consistent basis  and record. The patient has been taking his blood sugars more consistently. The patient feels his blood sugars are doing better. He is feeling better since he is not taking the medications that was making him so sick.  call provider for findings outside established parameters;       Referral made to pharmacy team for assistance with ongoing support and education for effective management of DM and other chronic conditions. Works with pharm D on  a regular basis;       Referral made to community resources care guide team for assistance with food resources for healthy eating and resources to help the patient with cost effective food options ;      Review of patient status, including review of consultants reports, relevant laboratory and other test results, and medications completed;       Advised patient to discuss changes in DM, medication  changes, and other question/concerns related to managing DM with provider;      Screening for signs and symptoms of depression related to chronic disease state;        Assessed social determinant of health barriers;         Symptom Management: Take medications as prescribed   Attend all scheduled provider appointments Call provider office for new concerns or questions  call the Suicide and Crisis Lifeline: 988 call the [place] National Suicide Prevention Lifeline: 934 184 9360 or TTY: 984-659-9057 TTY 5642878514) to talk to a trained counselor call 1-800-273-TALK (toll free, 24 hour hotline) if experiencing a Mental Health or Behavioral Health Crisis  check blood sugar at prescribed times: once daily, when you have symptoms of low or high blood sugar, and check blood sugars more consistently. The patient does not always check as he should  check feet daily for cuts, sores or redness trim toenails straight across fill half of plate with vegetables limit fast food meals to no more than 1 per week prepare main meal at home 3 to 5 days each week read food labels for fat, fiber, carbohydrates and portion size keep feet up while sitting wash and dry feet carefully every day wear comfortable, cotton socks wear comfortable, well-fitting shoes  Follow Up Plan: Telephone follow up appointment with care management team member scheduled for: 12-02-2023 at 315 pm       RNCM Care Management Expected Outcome:  Monitor, Self-Manage, and Reduce Symptoms of Hypertension       Current Barriers:  Care Coordination needs related to food resources and help with cost constraints of food in a patient with HTN and other chronic conditions Chronic Disease Management support and education needs related to effective management of HTN Financial Constraints.    BP Readings from Last 3 Encounters:  09/14/23 129/68  08/05/23 132/88  06/03/23 (!) 142/71    Planned Interventions: Evaluation of current  treatment plan related to hypertension self management and patient's adherence to plan as established by provider. The patient states that he has not been checking blood pressure regularly. Denies any acute changes. ;   Provided education to patient re: stroke prevention, s/s of heart attack and stroke; Reviewed prescribed diet heart healthy/ADA diet. Has talked with care guides for resources to help with food. The patient has some paperwork to fill out. Education provided.  Reviewed medications with patient and discussed importance of compliance. Takes medications as directed. Works with pharm D ;  Discussed plans with patient for ongoing care management follow up and provided patient with direct contact information for care management team; Advised patient, providing education and rationale, to monitor blood pressure daily and record, calling PCP for findings outside established parameters;  Reviewed scheduled/upcoming provider appointments including: 12-16-2023 with pcp Advised patient to discuss changes in HTN and heart health with provider; Provided education on prescribed diet heart healthy/ADA diet ;  Discussed complications of poorly controlled blood pressure such as heart disease, stroke, circulatory complications, vision complications, kidney impairment, sexual dysfunction;  Screening for signs and symptoms of depression  related to chronic disease state;  Assessed social determinant of health barriers;   Symptom Management: Take medications as prescribed   Attend all scheduled provider appointments Call provider office for new concerns or questions  call the Suicide and Crisis Lifeline: 988 call the [place] National Suicide Prevention Lifeline: 302 727 6876 or TTY: (548) 483-2521 TTY 702-181-4029) to talk to a trained counselor call 1-800-273-TALK (toll free, 24 hour hotline) if experiencing a Mental Health or Behavioral Health Crisis  check blood pressure weekly learn about high blood  pressure call doctor for signs and symptoms of high blood pressure develop an action plan for high blood pressure keep all doctor appointments take medications for blood pressure exactly as prescribed report new symptoms to your doctor  Follow Up Plan: Telephone follow up appointment with care management team member scheduled for: 12-02-2023 at 315 pm           Our next appointment is by telephone on 12-02-2023 at 3:45 pm  Please call the care guide team at [phone] if you need to cancel or reschedule your appointment.   If you are experiencing a Mental Health or Behavioral Health Crisis or need someone to talk to, please call the Suicide and Crisis Lifeline: 988 call the [place] National Suicide Prevention Lifeline: 534-451-7646 or TTY: 775-444-2608 TTY 270 185 5424) to talk to a trained counselor call 1-800-273-TALK (toll free, 24 hour hotline) call 911   The patient verbalized understanding of instructions, educational materials, and care plan provided today and DECLINED offer to receive copy of patient instructions, educational materials, and care plan.   Telephone follow up appointment with care management team member scheduled for: 12-02-2023 at 3:45 pm  Danise Edge, BSN RN RN Care Manager  [facility]  Ambulatory Care Management  Direct Number: [phone]

## 2023-10-19 ENCOUNTER — Other Ambulatory Visit: Payer: Self-pay | Admitting: Family Medicine

## 2023-10-20 NOTE — Telephone Encounter (Signed)
Requested medication (s) are due for refill today:   Not sure  Requested medication (s) are on the active medication list:   Yes as historical  Future visit scheduled:   Yes 12/16/2023 with Dr. Laural Benes.   Last ordered: Unknown  Unable to refill because this is from another, historical provider.   Looks like it's from [place], [facility].  Pt has an appt with Elnita Maxwell coming up on 11/02/2023.       Requested Prescriptions  Pending Prescriptions Disp Refills   glipiZIDE (GLUCOTROL) 5 MG tablet [Pharmacy Med Name: glipiZIDE 5 MG TABLET] 180 tablet     Sig: TAKE 1 TABLET BY MOUTH TWICE A DAY BEFORE A MEAL     Endocrinology:  Diabetes - Sulfonylureas Failed - 10/19/2023  1:10 PM      Failed - HBA1C is between 0 and 7.9 and within 180 days    HB A1C (BAYER DCA - WAIVED)  Date Value Ref Range Status  08/05/2023 9.3 (H) 4.8 - 5.6 % Final    Comment:             Prediabetes: 5.7 - 6.4          Diabetes: >6.4          Glycemic control for adults with diabetes: <7.0    Hgb A1c MFr Bld  Date Value Ref Range Status  09/14/2023 8.7 (H) 4.8 - 5.6 % Final    Comment:             Prediabetes: 5.7 - 6.4          Diabetes: >6.4          Glycemic control for adults with diabetes: <7.0          Failed - Cr in normal range and within 360 days    Creatinine  Date Value Ref Range Status  06/02/2012 1.10 0.60 - 1.30 mg/dL Final   Creatinine, Ser  Date Value Ref Range Status  08/05/2023 2.15 (H) 0.76 - 1.27 mg/dL Final         Passed - Valid encounter within last 6 months    Recent Outpatient Visits           1 month ago Encounter for [employer] annual wellness exam   [place] [facility], Megan P, DO   2 months ago Uncontrolled type 2 diabetes mellitus with hyperglycemia, without long-term current use of insulin (HCC)   [place] [facility] [place], Megan P, DO   4 months ago Left upper quadrant abdominal pain   [place] [facility]  Mecum, Erin E, PA-C   5 months ago Uncontrolled type 2 diabetes mellitus with hyperglycemia (HCC)   [place] [facility] [place], Megan P, DO   8 months ago Controlled type 2 diabetes mellitus with hyperglycemia, without long-term current use of insulin [facility])    [facility] [place], Oralia Rud, DO       Future Appointments             In 1 month Johnson, Oralia Rud, DO  [facility]

## 2023-10-22 ENCOUNTER — Ambulatory Visit: Payer: Self-pay

## 2023-10-22 MED ORDER — GLIPIZIDE 5 MG PO TABS: 5.0000 mg | ORAL_TABLET | Freq: Two times a day (BID) | ORAL | 0 refills | Status: DC

## 2023-10-22 NOTE — Telephone Encounter (Signed)
He should be taking both.

## 2023-10-22 NOTE — Addendum Note (Signed)
Addended by: Malen Gauze on: 10/22/2023 04:34 PM   Modules accepted: Orders

## 2023-10-22 NOTE — Addendum Note (Signed)
Addended by: Dorcas Carrow on: 10/22/2023 04:54 PM   Modules accepted: Orders

## 2023-10-22 NOTE — Telephone Encounter (Signed)
  Chief Complaint: Medication question Symptoms: none Frequency: now Pertinent Negatives: Patient denies  Disposition: [] ED /[] Urgent Care (no appt availability in office) / [] Appointment(In office/virtual)/ []  [place] Virtual [facility]/ [] Home Care/ [] Refused Recommended Disposition /[] [place] [facility]/ [x]  Follow-up with PCP Additional Notes: Pt is unsure if they should be taking both [place] and Glipizide together. Glipizide is listed as historical.  If pt is supposed to be taking both pt will need a refill on glipizide.  Pt would like a call back.    Summary: medication clarification   Patient called and states he needs medication clarification. He received a new prescription in the mail for Marcelline Deist and he is not sure if he should be taking glipiZIDE (GLUCOTROL) 5 MG tablet with it. Patient states if so, he needs a refill on the glipiZIDE (GLUCOTROL) 5 MG tablet sent to [facility] [id number] [place], [place] - [address] Phone: [phone] Fax: [phone]  Patient is completely out of Glipizide, patient received Farxiga 3 days ago. Patient has been taking both together.  Please advise. Patients callback # [phone]     Reason for Disposition  [1] Caller has NON-URGENT medicine question about med that PCP prescribed AND [2] triager unable to answer question  Answer Assessment - Initial Assessment Questions 1. NAME of MEDICINE: "What medicine(s) are you calling about?"     Glipizide and Farxiga 2. QUESTION: "What is your question?" (e.g., double dose of medicine, side effect)     Should pt be taking both of these  medications?  3. PRESCRIBER: "Who prescribed the medicine?" Reason: if prescribed by specialist, call should be referred to that group.     Dr. Laural Benes  Protocols used: Medication Question Call-A-AH

## 2023-11-02 ENCOUNTER — Other Ambulatory Visit: Payer: Medicare HMO

## 2023-11-02 NOTE — Progress Notes (Signed)
   11/02/2023  Patient ID: Lucas Weiss, male   DOB: [birth year], 67 y.o.   MRN: [id number]  Patient outreach to follow-up on control of T2MD after recent medication changes.  Patient was not at home and requested a telephone call Thursday at 4pm.  Telephone visit scheduled for then.  Lenna Gilford, PharmD, DPLA

## 2023-11-03 NOTE — Telephone Encounter (Signed)
PAP: Patient assistance application for Marcelline Deist has been approved by PAP Companies: [employer] from 09/30/2023 to 12/20/2024. Medication should be delivered to PAP Delivery: Home For further shipping updates, please contact [employer] ([employer]) at [phone] Pt ID is: [id number]

## 2023-11-04 ENCOUNTER — Other Ambulatory Visit: Payer: Self-pay

## 2023-11-04 NOTE — Progress Notes (Signed)
   11/04/2023  Patient ID: Lucas Weiss, male   DOB: [birth year], 67 y.o.   MRN: [id number]  S/O Telephone visit to follow-up on management of T2DM  Diabetes Management Plan -Current medications:  glipizide 5mg  bid before meals, Farxiga 10mg , metformin xr 500mg  daily  -Receives Farxiga through [employer] PAP- enrolled through 12/20/24 -Medications tried in the past:  Rybelsus caused dry mouth and dark stool, patient could not tolerate -Patient stopped Rybelsus therapy approximately 2 months ago and has been taking [place] approximately 1 month now -Endorses elevated BG since stopping Rybelsus:  FBG 148, 1 hour post-prandial 250 -A1c 09/14/23 8.7%   A/P  Diabetes Management Plan -Currently uncontrolled -Patient sees PCP again 12/26 and is due for follow-up A1c; if A1c remains>7.0%, I recommend titrating metformin as tolerated up to TDD 200mg  or initiating Januvia (would qualify for PAP)  Follow-up:  Will follow-up with patient after next visit with PCP  Lenna Gilford, PharmD, DPLA

## 2023-11-08 ENCOUNTER — Encounter: Payer: Self-pay | Admitting: Family Medicine

## 2023-12-02 ENCOUNTER — Ambulatory Visit: Payer: Self-pay | Admitting: *Deleted

## 2023-12-02 NOTE — Patient Outreach (Signed)
  Care Coordination   Follow Up Visit Note   12/02/2023 Name: Lucas Weiss MRN: [id number] DOB: 1956-07-30  Lucas Weiss is a 67 y.o. year old male who sees Dorcas Carrow, DO for primary care. I spoke with  Lucas Weiss by phone today.  What matters to the patients health and wellness today?  Patient report he is not home, will call this RNCM back once he is out of the store.      SDOH assessments and interventions completed:  No     Care Coordination Interventions:  No, not indicated   Follow up plan:  If no call back from patient, will have care guide reschedule.    Encounter Outcome:  Patient Visit Completed   Rodney Langton, RN, MSN, CCM [place]  [facility], [facility] RN Care Coordinator Direct Dial: [phone] / Main [phone] Fax [phone] Email: [email or web address]: [email or web address]

## 2023-12-06 ENCOUNTER — Telehealth: Payer: Self-pay | Admitting: *Deleted

## 2023-12-06 NOTE — Patient Outreach (Signed)
Care Coordination   Follow Up Visit Note   12/06/2023 Name: Lucas Weiss MRN: [id number] DOB: [birth year]  Lucas Weiss is a 67 y.o. year old male who sees Dorcas Carrow, DO for primary care. I spoke with  Lucas Weiss by phone today.  What matters to the patients health and wellness today?  Remaining stable is patient's main concern, keeping blood pressure and blood sugar within normal range.     Goals Addressed             This Visit's Progress    COMPLETED: RNCM Care Management Expected Outcome:  Monitor, Self-Manage and Reduce Symptoms of Diabetes   On track    Current Barriers:  Knowledge Deficits related to the importance of checking blood sugars and recording and monitoring for spikes and normals in blood sugar readings and the importance of effective management of DM Care Coordination needs related to food resources for healthy eating  in a patient with DM and financial restrictions.  Chronic Disease Management support and education needs related to effective management of DM Financial Constraints.  Lab Results  Component Value Date   HGBA1C 8.7 (H) 09/14/2023    Planned Interventions: Provided education to patient about basic DM disease process. The patient states his blood sugars are great in the morning ranging for 110-111. He states that if his blood sugar is within an acceptable range he feels as if he can "cheat" on his meals. RNCM has advised of the importance of maintain a diabetic diet and refraining from excessive carbohydrates and sugar. He was also advised to follow up with Nephrology and he stated he does not wish to continue following up with Nephrology as he feels he does not need another specialist. He states even if it was recommended he would not consider dialysis. He his hopeful that his new medication that he received will help lower his blood sugars and hopefully that will help with his kidney function. He states that he was taking a lot of supplements  and that could have been the cause of his elevated kidney function. RNCM attempted to provide education and patient continued to disregard education being provided. Reviewed medications with patient and discussed importance of medication adherence. Working with pharm D for PAP with medications. Is taking his medications as ordered.  Reviewed prescribed diet with patient heart healthy/ADA diet. Education given. The patient is noncompliant with dietary restrictions.  Counseled on importance of regular laboratory monitoring as prescribed. Has labs on a regular basis. The patient had lab work recently and will have new labs in November.;        Discussed plans with patient for ongoing care management follow up and provided patient with direct contact information for care management team;      Provided patient with written educational materials related to hypo and hyperglycemia and importance of correct treatment. Lowest he has seen is 110 and highest around 160;       Reviewed scheduled/upcoming provider appointments including:12-16-2023 with pcp;         Advised patient, providing education and rationale, to check cbg once daily, when you have symptoms of low or high blood sugar, and encouraged the patient to take his blood sugars on a more consistent basis  and record. The patient has been taking his blood sugars more consistently. The patient feels his blood sugars are doing better. He is feeling better since he is not taking the medications that was making him so sick.  call  provider for findings outside established parameters;       Referral made to pharmacy team for assistance with ongoing support and education for effective management of DM and other chronic conditions. Works with pharm D on a regular basis;       Referral made to community resources care guide team for assistance with food resources for healthy eating and resources to help the patient with cost effective food options ;      Review of  patient status, including review of consultants reports, relevant laboratory and other test results, and medications completed;       Advised patient to discuss changes in DM, medication changes, and other question/concerns related to managing DM with provider;      Screening for signs and symptoms of depression related to chronic disease state;        Assessed social determinant of health barriers;         Symptom Management: Take medications as prescribed   Attend all scheduled provider appointments Call provider office for new concerns or questions  call the Suicide and Crisis Lifeline: 988 call the [place] National Suicide Prevention Lifeline: 785 803 6678 or TTY: 580-493-2927 TTY (919)085-8421) to talk to a trained counselor call 1-800-273-TALK (toll free, 24 hour hotline) if experiencing a Mental Health or Behavioral Health Crisis  check blood sugar at prescribed times: once daily, when you have symptoms of low or high blood sugar, and check blood sugars more consistently. The patient does not always check as he should  check feet daily for cuts, sores or redness trim toenails straight across fill half of plate with vegetables limit fast food meals to no more than 1 per week prepare main meal at home 3 to 5 days each week read food labels for fat, fiber, carbohydrates and portion size keep feet up while sitting wash and dry feet carefully every day wear comfortable, cotton socks wear comfortable, well-fitting shoes  Follow Up Plan: Telephone follow up appointment with care management team member scheduled for: 12-02-2023 at 315 pm       COMPLETED: RNCM Care Management Expected Outcome:  Monitor, Self-Manage, and Reduce Symptoms of Hypertension   On track    Current Barriers:  Care Coordination needs related to food resources and help with cost constraints of food in a patient with HTN and other chronic conditions Chronic Disease Management support and education needs related to  effective management of HTN Financial Constraints.    BP Readings from Last 3 Encounters:  09/14/23 129/68  08/05/23 132/88  06/03/23 (!) 142/71    Planned Interventions: Evaluation of current treatment plan related to hypertension self management and patient's adherence to plan as established by provider. The patient states that he has not been checking blood pressure regularly. Denies any acute changes. ;   Provided education to patient re: stroke prevention, s/s of heart attack and stroke; Reviewed prescribed diet heart healthy/ADA diet. Has talked with care guides for resources to help with food. The patient has some paperwork to fill out. Education provided.  Reviewed medications with patient and discussed importance of compliance. Takes medications as directed. Works with pharm D ;  Discussed plans with patient for ongoing care management follow up and provided patient with direct contact information for care management team; Advised patient, providing education and rationale, to monitor blood pressure daily and record, calling PCP for findings outside established parameters;  Reviewed scheduled/upcoming provider appointments including: 12-16-2023 with pcp Advised patient to discuss changes in HTN and heart health  with provider; Provided education on prescribed diet heart healthy/ADA diet ;  Discussed complications of poorly controlled blood pressure such as heart disease, stroke, circulatory complications, vision complications, kidney impairment, sexual dysfunction;  Screening for signs and symptoms of depression related to chronic disease state;  Assessed social determinant of health barriers;   Symptom Management: Take medications as prescribed   Attend all scheduled provider appointments Call provider office for new concerns or questions  call the Suicide and Crisis Lifeline: 988 call the [place] National Suicide Prevention Lifeline: 858-033-8744 or TTY: 346 401 8079 TTY  5401247578) to talk to a trained counselor call 1-800-273-TALK (toll free, 24 hour hotline) if experiencing a Mental Health or Behavioral Health Crisis  check blood pressure weekly learn about high blood pressure call doctor for signs and symptoms of high blood pressure develop an action plan for high blood pressure keep all doctor appointments take medications for blood pressure exactly as prescribed report new symptoms to your doctor  Follow Up Plan: Telephone follow up appointment with care management team member scheduled for: 12-02-2023 at 315 pm          SDOH assessments and interventions completed:  No     Care Coordination Interventions:  Yes, provided   Interventions Today    Flowsheet Row Most Recent Value  Chronic Disease   Chronic disease during today's visit Diabetes, Hypertension (HTN)  General Interventions   General Interventions Discussed/Reviewed General Interventions Reviewed, Labs, Doctor Visits  Labs Hgb A1c every 3 months  Doctor Visits Discussed/Reviewed Doctor Visits Reviewed, PCP  [PCP scheduled for 12/27]  PCP/Specialist Visits Compliance with follow-up visit  Exercise Interventions   Exercise Discussed/Reviewed Physical Activity  Physical Activity Discussed/Reviewed Physical Activity Reviewed  [Encouraged to exercise in effort to decrease BP and blood sugar]  Education Interventions   Education Provided Provided Education  Provided Verbal Education On Exercise, Medication, Blood Sugar Monitoring, When to see the doctor, Nutrition, Labs  [Was taking Marcelline Deist, state he stopped due to GI side effects. BP more stable, but blood sugars remain slightly elevated.]  Labs Reviewed Hgb A1c  [Aware to remind provider A1C needs to be updated]  Nutrition Interventions   Nutrition Discussed/Reviewed Decreasing sugar intake, Nutrition Reviewed, Adding fruits and vegetables, Fluid intake  [Encouraged to decrease soda intake]       Follow up plan: Follow up  call scheduled for 2/4    Encounter Outcome:  Patient Visit Completed   Rodney Langton, RN, MSN, CCM   [facility], [facility] RN Care Coordinator Direct Dial: [phone] / Main [phone] Fax [phone] Email: [email or web address]: [email or web address]

## 2023-12-09 ENCOUNTER — Other Ambulatory Visit: Payer: Self-pay | Admitting: Family Medicine

## 2023-12-09 DIAGNOSIS — I110 Hypertensive heart disease with heart failure: Secondary | ICD-10-CM

## 2023-12-09 DIAGNOSIS — E782 Mixed hyperlipidemia: Secondary | ICD-10-CM

## 2023-12-09 NOTE — Telephone Encounter (Signed)
Requested Prescriptions  Pending Prescriptions Disp Refills   lovastatin (MEVACOR) 40 MG tablet [Pharmacy Med Name: LOVASTATIN 40 MG TABLET] 90 tablet 1    Sig: TAKE 1 TABLET BY MOUTH AT BEDTIME     Cardiovascular:  Antilipid - Statins 2 Failed - 12/09/2023  4:33 PM      Failed - Cr in normal range and within 360 days    Creatinine  Date Value Ref Range Status  06/02/2012 1.10 0.60 - 1.30 mg/dL Final   Creatinine, Ser  Date Value Ref Range Status  08/05/2023 2.15 (H) 0.76 - 1.27 mg/dL Final         Failed - Lipid Panel in normal range within the last 12 months    Cholesterol, Total  Date Value Ref Range Status  08/05/2023 187 100 - 199 mg/dL Final   LDL Chol Calc (NIH)  Date Value Ref Range Status  08/05/2023 77 0 - 99 mg/dL Final   HDL  Date Value Ref Range Status  08/05/2023 35 (L) >39 mg/dL Final   Triglycerides  Date Value Ref Range Status  08/05/2023 470 (H) 0 - 149 mg/dL Final         Passed - Patient is not pregnant      Passed - Valid encounter within last 12 months    Recent Outpatient Visits           2 months ago Encounter for [employer] annual wellness exam   [place] [facility] [place], Megan P, DO   4 months ago Uncontrolled type 2 diabetes mellitus with hyperglycemia, without long-term current use of insulin (HCC)   [place] [facility] [place], Megan P, DO   6 months ago Left upper quadrant abdominal pain   [place] [facility] M[place], Erin E, PA-C   7 months ago Uncontrolled type 2 diabetes mellitus with hyperglycemia (HCC)   [place] [facility] [place], Megan P, DO   10 months ago Controlled type 2 diabetes mellitus with hyperglycemia, without long-term current use of insulin (HCC)   [place] [facility] [place], Frederic, DO       Future Appointments             In 1 week Johnson, Oralia Rud, DO [place] [facility], [facility]             metoprolol  tartrate (LOPRESSOR) 50 MG tablet [Pharmacy Med Name: METOPROLOL TARTRATE 50 MG TAB] 90 tablet 1    Sig: TAKE 1 TABLET BY MOUTH DAILY     Cardiovascular:  Beta Blockers Passed - 12/09/2023  4:33 PM      Passed - Last BP in normal range    BP Readings from Last 1 Encounters:  09/14/23 129/68         Passed - Last Heart Rate in normal range    Pulse Readings from Last 1 Encounters:  09/14/23 65         Passed - Valid encounter within last 6 months    Recent Outpatient Visits           2 months ago Encounter for [employer] annual wellness exam   [place] [facility] [place], Megan P, DO   4 months ago Uncontrolled type 2 diabetes mellitus with hyperglycemia, without long-term current use of insulin (HCC)   [place] [facility] [place], [place] P, DO   6 months ago Left upper quadrant abdominal pain   [place] [facility] M[place], Erin E, PA-C   7 months ago Uncontrolled type 2 diabetes mellitus with hyperglycemia (HCC)   [place] [facility] [place], Megan P, DO   10 months ago Controlled type 2 diabetes mellitus with hyperglycemia, without long-term current use of insulin (HCC)   [place] [facility] [place], [place], DO       Future Appointments             In 1 week Johnson, Oralia Rud, DO [place] [facility]             amLODipine (NORVASC) 5 MG tablet [Pharmacy Med Name: amLODIPine BESYLATE 5 MG TAB] 90 tablet 1    Sig: TAKE 1 TABLET BY MOUTH DAILY     Cardiovascular: Calcium Channel Blockers 2 Passed - 12/09/2023  4:33 PM      Passed - Last BP in normal range    BP Readings from Last 1 Encounters:  09/14/23 129/68         Passed - Last Heart Rate in normal range    Pulse Readings from Last 1 Encounters:  09/14/23 65         Passed - Valid encounter within last 6 months    Recent Outpatient Visits           2 months ago Encounter for [employer] annual wellness exam    [place] [facility] [place], Megan P, DO   4 months ago Uncontrolled type 2 diabetes mellitus with hyperglycemia, without long-term current use of insulin (HCC)   [place] [facility] [place], Megan P, DO   6 months ago Left upper quadrant abdominal pain   [place] [facility] Mecum, Erin E, PA-C   7 months ago Uncontrolled type 2 diabetes mellitus with hyperglycemia (HCC)   [place] [facility], Megan P, DO   10 months ago Controlled type 2 diabetes mellitus with hyperglycemia, without long-term current use of insulin [facility])   [place] [facility] [place], Oralia Rud, DO       Future Appointments             In 1 week Laural Benes, Oralia Rud, DO [place] [facility], [facility]

## 2023-12-16 ENCOUNTER — Ambulatory Visit: Payer: Medicare HMO | Admitting: Family Medicine

## 2023-12-17 ENCOUNTER — Encounter: Payer: Self-pay | Admitting: Family Medicine

## 2023-12-17 ENCOUNTER — Ambulatory Visit (INDEPENDENT_AMBULATORY_CARE_PROVIDER_SITE_OTHER): Payer: Medicare HMO | Admitting: Family Medicine

## 2023-12-17 VITALS — BP 197/74 | HR 66 | Temp 97.7°F | Ht 69.0 in | Wt 180.8 lb

## 2023-12-17 DIAGNOSIS — E1159 Type 2 diabetes mellitus with other circulatory complications: Secondary | ICD-10-CM | POA: Diagnosis not present

## 2023-12-17 DIAGNOSIS — E1165 Type 2 diabetes mellitus with hyperglycemia: Secondary | ICD-10-CM

## 2023-12-17 DIAGNOSIS — Z7985 Long-term (current) use of injectable non-insulin antidiabetic drugs: Secondary | ICD-10-CM

## 2023-12-17 DIAGNOSIS — E1169 Type 2 diabetes mellitus with other specified complication: Secondary | ICD-10-CM

## 2023-12-17 DIAGNOSIS — E785 Hyperlipidemia, unspecified: Secondary | ICD-10-CM | POA: Diagnosis not present

## 2023-12-17 DIAGNOSIS — I152 Hypertension secondary to endocrine disorders: Secondary | ICD-10-CM | POA: Diagnosis not present

## 2023-12-17 LAB — BAYER DCA HB A1C WAIVED: HB A1C (BAYER DCA - WAIVED): 8.6 % — ABNORMAL HIGH (ref 4.8–5.6)

## 2023-12-17 MED ORDER — OZEMPIC (0.25 OR 0.5 MG/DOSE) 2 MG/3ML ~~LOC~~ SOPN: 0.2500 mg | PEN_INJECTOR | SUBCUTANEOUS | 0 refills | Status: DC

## 2023-12-17 NOTE — Assessment & Plan Note (Signed)
Under good control on current regimen. Continue current regimen. Continue to monitor. Call with any concerns. Refills given. Labs drawn today.   

## 2023-12-17 NOTE — Assessment & Plan Note (Signed)
Unable to tolerate glipizide. Will try ozempic. A1c up at 8.6. Recheck tolerance in 1 month. Call with any concerns.

## 2023-12-17 NOTE — Progress Notes (Signed)
BP (!) 197/74 (BP Location: Left Arm, Patient Position: Sitting, Cuff Size: Normal)   Pulse 66   Temp 97.7 F (36.5 C) (Oral)   Ht 5\' 9"  (1.753 m)   Wt 180 lb 12.8 oz (82 kg)   SpO2 99%   BMI 26.70 kg/m    Subjective:    Patient ID: Lucas Weiss, male    DOB: 07/21/1956, 67 y.o.   MRN: [id number]  HPI: Lucas Weiss is a 67 y.o. male  Chief Complaint  Patient presents with   Diabetes   Hypertension   Hyperlipidemia   DIABETES Hypoglycemic episodes:no Polydipsia/polyuria: no Visual disturbance: no Chest pain: no Paresthesias: no Glucose Monitoring: no  Accucheck frequency: Not Checking Taking Insulin?: no Blood Pressure Monitoring: not checking Retinal Examination: Not up to Date Foot Exam: Up to Date Diabetic Education: Completed Pneumovax: Up to Date Influenza: Up to Date Aspirin: yes  HYPERTENSION / HYPERLIPIDEMIA- has been off his blood pressure medicine for about 5 days  Satisfied with current treatment? no Duration of hypertension: chronic BP monitoring frequency: not checking BP medication side effects: no Past BP meds: metoprolol, amlodipine, lisinopril Duration of hyperlipidemia: chronic Cholesterol medication side effects: no Cholesterol supplements: none Past cholesterol medications: crestor Medication compliance: fair compliance Aspirin: yes Recent stressors: no Recurrent headaches: no Visual changes: no Palpitations: no Dyspnea: no Chest pain: no Lower extremity edema: no Dizzy/lightheaded: no   Relevant past medical, surgical, family and social history reviewed and updated as indicated. Interim medical history since our last visit reviewed. Allergies and medications reviewed and updated.  Review of Systems  Constitutional: Negative.   Respiratory: Negative.    Cardiovascular: Negative.   Gastrointestinal: Negative.   Musculoskeletal: Negative.   Psychiatric/Behavioral: Negative.      Per HPI unless specifically indicated  above     Objective:    BP (!) 197/74 (BP Location: Left Arm, Patient Position: Sitting, Cuff Size: Normal)   Pulse 66   Temp 97.7 F (36.5 C) (Oral)   Ht 5\' 9"  (1.753 m)   Wt 180 lb 12.8 oz (82 kg)   SpO2 99%   BMI 26.70 kg/m   Wt Readings from Last 3 Encounters:  12/17/23 180 lb 12.8 oz (82 kg)  09/14/23 180 lb (81.6 kg)  08/05/23 179 lb 12.8 oz (81.6 kg)    Physical Exam Vitals and nursing note reviewed.  Constitutional:      General: He is not in acute distress.    Appearance: Normal appearance. He is not ill-appearing, toxic-appearing or diaphoretic.  HENT:     Head: Normocephalic and atraumatic.     Right Ear: External ear normal.     Left Ear: External ear normal.     Nose: Nose normal.     Mouth/Throat:     Mouth: Mucous membranes are moist.     Pharynx: Oropharynx is clear.  Eyes:     General: No scleral icterus.       Right eye: No discharge.        Left eye: No discharge.     Extraocular Movements: Extraocular movements intact.     Conjunctiva/sclera: Conjunctivae normal.     Pupils: Pupils are equal, round, and reactive to light.  Cardiovascular:     Rate and Rhythm: Normal rate and regular rhythm.     Pulses: Normal pulses.     Heart sounds: Normal heart sounds. No murmur heard.    No friction rub. No gallop.  Pulmonary:     Effort:  Pulmonary effort is normal. No respiratory distress.     Breath sounds: Normal breath sounds. No stridor. No wheezing, rhonchi or rales.  Chest:     Chest wall: No tenderness.  Musculoskeletal:        General: Normal range of motion.     Cervical back: Normal range of motion and neck supple.  Skin:    General: Skin is warm and dry.     Capillary Refill: Capillary refill takes less than 2 seconds.     Coloration: Skin is not jaundiced or pale.     Findings: No bruising, erythema, lesion or rash.  Neurological:     General: No focal deficit present.     Mental Status: He is alert and oriented to person, place, and  time. Mental status is at baseline.  Psychiatric:        Mood and Affect: Mood normal.        Behavior: Behavior normal.        Thought Content: Thought content normal.        Judgment: Judgment normal.     Results for orders placed or performed in visit on 09/14/23  Hgb A1c w/o eAG   Collection Time: 09/14/23  4:19 PM  Result Value Ref Range   Hgb A1c MFr Bld 8.7 (H) 4.8 - 5.6 %      Assessment & Plan:   Problem List Items Addressed This Visit       Cardiovascular and Mediastinum   Hypertension associated with diabetes (HCC)   Has not been taking his medicine for 5 days. Restart medicine. Recheck 1 month. Call with any concerns.       Relevant Medications   Semaglutide,0.25 or 0.5MG /DOS, (OZEMPIC, 0.25 OR 0.5 MG/DOSE,) 2 MG/3ML SOPN     Endocrine   Hyperlipidemia associated with type 2 diabetes mellitus (HCC)   Under good control on current regimen. Continue current regimen. Continue to monitor. Call with any concerns. Refills given.  Labs drawn today.       Relevant Medications   Semaglutide,0.25 or 0.5MG /DOS, (OZEMPIC, 0.25 OR 0.5 MG/DOSE,) 2 MG/3ML SOPN   Uncontrolled type 2 diabetes mellitus with hyperglycemia, without long-term current use of insulin (HCC) - Primary   Unable to tolerate glipizide. Will try ozempic. A1c up at 8.6. Recheck tolerance in 1 month. Call with any concerns.       Relevant Medications   Semaglutide,0.25 or 0.5MG /DOS, (OZEMPIC, 0.25 OR 0.5 MG/DOSE,) 2 MG/3ML SOPN   Other Relevant Orders   Bayer DCA Hb A1c Waived   CBC with Differential/Platelet   Comprehensive metabolic panel   Lipid Panel w/o Chol/HDL Ratio     Follow up plan: Return in about 4 weeks (around 01/14/2024).

## 2023-12-17 NOTE — Assessment & Plan Note (Signed)
Has not been taking his medicine for 5 days. Restart medicine. Recheck 1 month. Call with any concerns.

## 2023-12-18 LAB — CBC WITH DIFFERENTIAL/PLATELET
Basophils Absolute: 0.1 10*3/uL (ref 0.0–0.2)
Basos: 1 %
EOS (ABSOLUTE): 0.3 10*3/uL (ref 0.0–0.4)
Eos: 3 %
Hematocrit: 36.9 % — ABNORMAL LOW (ref 37.5–51.0)
Hemoglobin: 12.2 g/dL — ABNORMAL LOW (ref 13.0–17.7)
Immature Grans (Abs): 0 10*3/uL (ref 0.0–0.1)
Immature Granulocytes: 0 %
Lymphocytes Absolute: 2.1 10*3/uL (ref 0.7–3.1)
Lymphs: 26 %
MCH: 28.4 pg (ref 26.6–33.0)
MCHC: 33.1 g/dL (ref 31.5–35.7)
MCV: 86 fL (ref 79–97)
Monocytes Absolute: 0.6 10*3/uL (ref 0.1–0.9)
Monocytes: 8 %
Neutrophils Absolute: 5 10*3/uL (ref 1.4–7.0)
Neutrophils: 62 %
Platelets: 195 10*3/uL (ref 150–450)
RBC: 4.3 x10E6/uL (ref 4.14–5.80)
RDW: 14 % (ref 11.6–15.4)
WBC: 8 10*3/uL (ref 3.4–10.8)

## 2023-12-18 LAB — COMPREHENSIVE METABOLIC PANEL
ALT: 26 [IU]/L (ref 0–44)
AST: 19 [IU]/L (ref 0–40)
Albumin: 4.3 g/dL (ref 3.9–4.9)
Alkaline Phosphatase: 116 [IU]/L (ref 44–121)
BUN/Creatinine Ratio: 19 (ref 10–24)
BUN: 26 mg/dL (ref 8–27)
Bilirubin Total: 0.3 mg/dL (ref 0.0–1.2)
CO2: 18 mmol/L — ABNORMAL LOW (ref 20–29)
Calcium: 9.9 mg/dL (ref 8.6–10.2)
Chloride: 106 mmol/L (ref 96–106)
Creatinine, Ser: 1.39 mg/dL — ABNORMAL HIGH (ref 0.76–1.27)
Globulin, Total: 2.6 g/dL (ref 1.5–4.5)
Glucose: 158 mg/dL — ABNORMAL HIGH (ref 70–99)
Potassium: 4.8 mmol/L (ref 3.5–5.2)
Sodium: 141 mmol/L (ref 134–144)
Total Protein: 6.9 g/dL (ref 6.0–8.5)
eGFR: 56 mL/min/{1.73_m2} — ABNORMAL LOW (ref >59)

## 2023-12-18 LAB — LIPID PANEL W/O CHOL/HDL RATIO
Cholesterol, Total: 233 mg/dL — ABNORMAL HIGH (ref 100–199)
HDL: 39 mg/dL — ABNORMAL LOW (ref >39)
LDL Chol Calc (NIH): 133 mg/dL — ABNORMAL HIGH (ref 0–99)
Triglycerides: 339 mg/dL — ABNORMAL HIGH (ref 0–149)
VLDL Cholesterol Cal: 61 mg/dL — ABNORMAL HIGH (ref 5–40)

## 2023-12-20 NOTE — Progress Notes (Signed)
   12/20/2023  Patient ID: Lucas Weiss, male   DOB: 10-29-1956, 67 y.o.   MRN: [id number]  Clinic routed request from patient's PCP, Dr. Laural Benes, to assist with [employer] PAP for Ozempic 0.25/0.5mg .  Patient currently enrolled in [employer] PAP for Rybelsus through 12/21/2023; but he could not tolerate this medication.  Submitted 2025 re-enrollment application online for Ozempic 0.25/0.5mg  and pre-filled order change form for Ozempic 0.25/0.5mg .  Faxed form to [facility] for Dr. Laural Benes to sign and fax into [facility].  I will check on status of application prior to my 1/10 follow-up with Lucas Weiss.  Lenna Gilford, PharmD, DPLA

## 2023-12-24 ENCOUNTER — Encounter: Payer: Self-pay | Admitting: Family Medicine

## 2023-12-24 NOTE — Progress Notes (Signed)
 Printed and mailed on 12/24/2023.

## 2023-12-29 NOTE — Progress Notes (Signed)
   12/29/2023  Patient ID: Lucas Weiss, male   DOB: 1956-03-07, 68 y.o.   MRN: [id number]  Contacted [facility] PAP to check on processing of patient's 2025 re-enrollment application as well as the order change form for Ozempic  0.25/0.5mg .  Approved to receive Ozempic  0.25/0.5mg  through 12/20/24.  This should arrive at [facility] within the next 7-10 days.  Will notify patient during telephone follow-up later this week.   Lucas Weiss, PharmD, DPLA

## 2023-12-31 ENCOUNTER — Other Ambulatory Visit: Payer: Self-pay

## 2023-12-31 NOTE — Progress Notes (Signed)
   12/31/2023  Patient ID: Lucas Weiss, male   DOB: Aug 30, 1956, 68 y.o.   MRN: [id number]  S/O Patient outreach to follow-up on management of T2DM and HTN  Diabetes -Current medications:  Farxiga  10mg  daily, Metformin  500mg  daily -Patient recently approved for Ozempic  PAP through [employer]; medication should arrive at [facility] by the end of next week, and he will start with 0.25mg  weekly -Was not able to tolerate Rybelsus  -A1c in December 8.6% -Patient checks home BG regularly: endorses FBG averaging 110 and post-prandial 160-200 -Does not endorse any s/sx hypoglycemia -Endorses recent dietary modifications, including decreased soft drink intake  Hypertension -Current medications:  lisinopril  40mg  daily, amlodipine  5mg  daily, metoprolol  50mg  daily -BP elevated at last OV- 194/74, but patient states he was without amlodipine  5 days prior due to insurance stating it was too soon to fill -Patient monitors home BP regularly and states this has been averaging 120/80 now that he is back on BP medication regimen  A/P  Diabetes -Patient will start Ozemipc 0.25mg  weekly once medication arrives at [facility]; I counseled on use and administration.  Plan to increase to 0.5mg  weekly if tolerating after 4 weeks. -Continue metformin  500mg  and Farxiga  10mg  daily -Continue to monitor FBG and post-prandial BG daily and record  Hypertension -Improved -Continue current regimen and regular monitoring/recording of home BP -Sees Dr. Vicci again 1/28 to follow-up on this  Follow-up:  4 weeks  Channing DELENA Mealing, PharmD, DPLA

## 2024-01-02 ENCOUNTER — Other Ambulatory Visit: Payer: Self-pay | Admitting: Family Medicine

## 2024-01-04 NOTE — Telephone Encounter (Signed)
 Requested Prescriptions  Pending Prescriptions Disp Refills   lisinopril  (ZESTRIL ) 40 MG tablet [Pharmacy Med Name: LISINOPRIL  40 MG TABLET] 90 tablet 1    Sig: TAKE 1 TABLET BY MOUTH DAILY     Cardiovascular:  ACE Inhibitors Failed - 01/04/2024 12:13 PM      Failed - Cr in normal range and within 180 days    Creatinine  Date Value Ref Range Status  06/02/2012 1.10 0.60 - 1.30 mg/dL Final   Creatinine, Ser  Date Value Ref Range Status  12/17/2023 1.39 (H) 0.76 - 1.27 mg/dL Final         Failed - Last BP in normal range    BP Readings from Last 1 Encounters:  12/17/23 (!) 197/74         Passed - K in normal range and within 180 days    Potassium  Date Value Ref Range Status  12/17/2023 4.8 3.5 - 5.2 mmol/L Final  06/02/2012 4.5 3.5 - 5.1 mmol/L Final         Passed - Patient is not pregnant      Passed - Valid encounter within last 6 months    Recent Outpatient Visits           2 weeks ago Uncontrolled type 2 diabetes mellitus with hyperglycemia, without long-term current use of insulin (HCC)   [place] [facility] [place], Megan P, DO   3 months ago Encounter for [employer] annual wellness exam   [place] [facility] [place], Megan P, DO   5 months ago Uncontrolled type 2 diabetes mellitus with hyperglycemia, without long-term current use of insulin (HCC)   [place] [facility] [place], Megan P, DO   7 months ago Left upper quadrant abdominal pain   [place] [facility] M[place], Erin E, PA-C   8 months ago Uncontrolled type 2 diabetes mellitus with hyperglycemia [facility])   [place] [facility] [place], Duwaine SQUIBB, DO       Future Appointments             In 2 weeks Vicci, Duwaine SQUIBB, DO  [facility], [facility]

## 2024-01-17 ENCOUNTER — Other Ambulatory Visit: Payer: Self-pay | Admitting: Family Medicine

## 2024-01-18 ENCOUNTER — Ambulatory Visit (INDEPENDENT_AMBULATORY_CARE_PROVIDER_SITE_OTHER): Payer: Medicare HMO | Admitting: Family Medicine

## 2024-01-18 ENCOUNTER — Encounter: Payer: Self-pay | Admitting: Family Medicine

## 2024-01-18 VITALS — BP 134/64 | HR 70 | Temp 98.4°F | Wt 184.0 lb

## 2024-01-18 DIAGNOSIS — I152 Hypertension secondary to endocrine disorders: Secondary | ICD-10-CM

## 2024-01-18 DIAGNOSIS — Z7985 Long-term (current) use of injectable non-insulin antidiabetic drugs: Secondary | ICD-10-CM | POA: Diagnosis not present

## 2024-01-18 DIAGNOSIS — E1159 Type 2 diabetes mellitus with other circulatory complications: Secondary | ICD-10-CM | POA: Diagnosis not present

## 2024-01-18 NOTE — Telephone Encounter (Signed)
Requested Prescriptions  Refused Prescriptions Disp Refills   glipiZIDE (GLUCOTROL) 5 MG tablet [Pharmacy Med Name: glipiZIDE 5 MG TABLET] 180 tablet 0    Sig: TAKE 1 TABLET BY MOUTH 2 TIMES A DAY BEFORE A MEAL     Endocrinology:  Diabetes - Sulfonylureas Failed - 01/18/2024  5:43 PM      Failed - HBA1C is between 0 and 7.9 and within 180 days    HB A1C (BAYER DCA - WAIVED)  Date Value Ref Range Status  12/17/2023 8.6 (H) 4.8 - 5.6 % Final    Comment:             Prediabetes: 5.7 - 6.4          Diabetes: >6.4          Glycemic control for adults with diabetes: <7.0          Failed - Cr in normal range and within 360 days    Creatinine  Date Value Ref Range Status  06/02/2012 1.10 0.60 - 1.30 mg/dL Final   Creatinine, Ser  Date Value Ref Range Status  12/17/2023 1.39 (H) 0.76 - 1.27 mg/dL Final         Passed - Valid encounter within last 6 months    Recent Outpatient Visits           Today Hypertension associated with diabetes (HCC)   [place] [facility] [place], Megan P, DO   1 month ago Uncontrolled type 2 diabetes mellitus with hyperglycemia, without long-term current use of insulin (HCC)   [place] [facility] [place], Megan P, DO   4 months ago Encounter for [employer] annual wellness exam   [place] [facility] [place], Megan P, DO   5 months ago Uncontrolled type 2 diabetes mellitus with hyperglycemia, without long-term current use of insulin (HCC)   [place] [facility] [place], Megan P, DO   7 months ago Left upper quadrant abdominal pain   [place] [facility] Mecum, Oswaldo Conroy, PA-C       Future Appointments             In 2 months Laural Benes, Oralia Rud, DO [place] [facility], [facility]

## 2024-01-18 NOTE — Assessment & Plan Note (Signed)
Under good control on current regimen. Continue current regimen. Continue to monitor. Call with any concerns. Refills up to date. Labs drawn today.

## 2024-01-18 NOTE — Progress Notes (Signed)
BP 134/64   Pulse 70   Temp 98.4 F (36.9 C) (Oral)   Wt 184 lb (83.5 kg)   SpO2 97%   BMI 27.17 kg/m    Subjective:    Patient ID: Lucas Weiss, male    DOB: Sep 18, 1956, 68 y.o.   MRN: [id number]  HPI: Lucas Weiss is a 68 y.o. male  Chief Complaint  Patient presents with   Hypertension   HYPERTENSION  Hypertension status: better  Satisfied with current treatment? yes Duration of hypertension: chronic BP monitoring frequency:  not checking BP medication side effects:  no Medication compliance: excellent compliance Previous BP meds:amlodipine, lisinopril, metoprolol Aspirin: yes Recurrent headaches: no Visual changes: no Palpitations: no Dyspnea: no Chest pain: no Lower extremity edema: no Dizzy/lightheaded: no   Relevant past medical, surgical, family and social history reviewed and updated as indicated. Interim medical history since our last visit reviewed. Allergies and medications reviewed and updated.  Review of Systems  Constitutional: Negative.   Respiratory: Negative.    Cardiovascular: Negative.   Musculoskeletal: Negative.   Psychiatric/Behavioral: Negative.      Per HPI unless specifically indicated above     Objective:    BP 134/64   Pulse 70   Temp 98.4 F (36.9 C) (Oral)   Wt 184 lb (83.5 kg)   SpO2 97%   BMI 27.17 kg/m   Wt Readings from Last 3 Encounters:  01/18/24 184 lb (83.5 kg)  12/17/23 180 lb 12.8 oz (82 kg)  09/14/23 180 lb (81.6 kg)    Physical Exam Vitals and nursing note reviewed.  Constitutional:      General: He is not in acute distress.    Appearance: Normal appearance. He is not ill-appearing, toxic-appearing or diaphoretic.  HENT:     Head: Normocephalic and atraumatic.     Right Ear: External ear normal.     Left Ear: External ear normal.     Nose: Nose normal.     Mouth/Throat:     Mouth: Mucous membranes are moist.     Pharynx: Oropharynx is clear.  Eyes:     General: No scleral icterus.        Right eye: No discharge.        Left eye: No discharge.     Extraocular Movements: Extraocular movements intact.     Conjunctiva/sclera: Conjunctivae normal.     Pupils: Pupils are equal, round, and reactive to light.  Cardiovascular:     Rate and Rhythm: Normal rate and regular rhythm.     Pulses: Normal pulses.     Heart sounds: Normal heart sounds. No murmur heard.    No friction rub. No gallop.  Pulmonary:     Effort: Pulmonary effort is normal. No respiratory distress.     Breath sounds: Normal breath sounds. No stridor. No wheezing, rhonchi or rales.  Chest:     Chest wall: No tenderness.  Musculoskeletal:        General: Normal range of motion.     Cervical back: Normal range of motion and neck supple.  Skin:    General: Skin is warm and dry.     Capillary Refill: Capillary refill takes less than 2 seconds.     Coloration: Skin is not jaundiced or pale.     Findings: No bruising, erythema, lesion or rash.  Neurological:     General: No focal deficit present.     Mental Status: He is alert and oriented to person, place, and time.  Mental status is at baseline.  Psychiatric:        Mood and Affect: Mood normal.        Behavior: Behavior normal.        Thought Content: Thought content normal.        Judgment: Judgment normal.     Results for orders placed or performed in visit on 12/17/23  Bayer DCA Hb A1c Waived   Collection Time: 12/17/23  2:14 PM  Result Value Ref Range   HB A1C (BAYER DCA - WAIVED) 8.6 (H) 4.8 - 5.6 %  CBC with Differential/Platelet   Collection Time: 12/17/23  2:15 PM  Result Value Ref Range   WBC 8.0 3.4 - 10.8 x10E3/uL   RBC 4.30 4.14 - 5.80 x10E6/uL   Hemoglobin 12.2 (L) 13.0 - 17.7 g/dL   Hematocrit 16.1 (L) 09.6 - 51.0 %   MCV 86 79 - 97 fL   MCH 28.4 26.6 - 33.0 pg   MCHC 33.1 31.5 - 35.7 g/dL   RDW 04.5 40.9 - 81.1 %   Platelets 195 150 - 450 x10E3/uL   Neutrophils 62 Not Estab. %   Lymphs 26 Not Estab. %   Monocytes 8 Not Estab. %    Eos 3 Not Estab. %   Basos 1 Not Estab. %   Neutrophils Absolute 5.0 1.4 - 7.0 x10E3/uL   Lymphocytes Absolute 2.1 0.7 - 3.1 x10E3/uL   Monocytes Absolute 0.6 0.1 - 0.9 x10E3/uL   EOS (ABSOLUTE) 0.3 0.0 - 0.4 x10E3/uL   Basophils Absolute 0.1 0.0 - 0.2 x10E3/uL   Immature Granulocytes 0 Not Estab. %   Immature Grans (Abs) 0.0 0.0 - 0.1 x10E3/uL  Comprehensive metabolic panel   Collection Time: 12/17/23  2:15 PM  Result Value Ref Range   Glucose 158 (H) 70 - 99 mg/dL   BUN 26 8 - 27 mg/dL   Creatinine, Ser 9.14 (H) 0.76 - 1.27 mg/dL   eGFR 56 (L) >78 GN/FAO/1.30   BUN/Creatinine Ratio 19 10 - 24   Sodium 141 134 - 144 mmol/L   Potassium 4.8 3.5 - 5.2 mmol/L   Chloride 106 96 - 106 mmol/L   CO2 18 (L) 20 - 29 mmol/L   Calcium 9.9 8.6 - 10.2 mg/dL   Total Protein 6.9 6.0 - 8.5 g/dL   Albumin 4.3 3.9 - 4.9 g/dL   Globulin, Total 2.6 1.5 - 4.5 g/dL   Bilirubin Total 0.3 0.0 - 1.2 mg/dL   Alkaline Phosphatase 116 44 - 121 IU/L   AST 19 0 - 40 IU/L   ALT 26 0 - 44 IU/L  Lipid Panel w/o Chol/HDL Ratio   Collection Time: 12/17/23  2:15 PM  Result Value Ref Range   Cholesterol, Total 233 (H) 100 - 199 mg/dL   Triglycerides 865 (H) 0 - 149 mg/dL   HDL 39 (L) >78 mg/dL   VLDL Cholesterol Cal 61 (H) 5 - 40 mg/dL   LDL Chol Calc (NIH) 469 (H) 0 - 99 mg/dL      Assessment & Plan:   Problem List Items Addressed This Visit       Cardiovascular and Mediastinum   Hypertension associated with diabetes (HCC) - Primary   Under good control on current regimen. Continue current regimen. Continue to monitor. Call with any concerns. Refills up to date. Labs drawn today.        Relevant Orders   Basic metabolic panel     Follow up plan: Return in about 2  months (around 03/17/2024).

## 2024-01-19 LAB — BASIC METABOLIC PANEL
BUN/Creatinine Ratio: 19 (ref 10–24)
BUN: 32 mg/dL — ABNORMAL HIGH (ref 8–27)
CO2: 19 mmol/L — ABNORMAL LOW (ref 20–29)
Calcium: 10.3 mg/dL — ABNORMAL HIGH (ref 8.6–10.2)
Chloride: 106 mmol/L (ref 96–106)
Creatinine, Ser: 1.7 mg/dL — ABNORMAL HIGH (ref 0.76–1.27)
Glucose: 235 mg/dL — ABNORMAL HIGH (ref 70–99)
Potassium: 4.7 mmol/L (ref 3.5–5.2)
Sodium: 142 mmol/L (ref 134–144)
eGFR: 44 mL/min/{1.73_m2} — ABNORMAL LOW (ref >59)

## 2024-01-20 ENCOUNTER — Telehealth: Payer: Self-pay

## 2024-01-20 NOTE — Telephone Encounter (Signed)
4 boxes of Ozempic received for the patient. Called and notified patient that medication is ready to be picked up.

## 2024-01-24 NOTE — Telephone Encounter (Signed)
 Medication picked up by the patient.

## 2024-01-25 ENCOUNTER — Ambulatory Visit: Payer: Self-pay | Admitting: *Deleted

## 2024-01-25 NOTE — Patient Outreach (Signed)
  Care Coordination   Follow Up Visit Note   01/26/2024 Name: Lucas Weiss MRN: [id number] DOB: 12-13-1956  Lucas Weiss is a 68 y.o. year old male who sees Vicci Duwaine SQUIBB, DO for primary care. I spoke with  Elspeth CHRISTELLA Ferraris and daughter by phone today.  What matters to the patients health and wellness today?  Patient state he is doing well, but daughter report she has been trying to help patient better manage his health lately.  She will obtain MyChart access to review his notes and meds, this RNCM will follow up with her for plan of care.     Goals Addressed             This Visit's Progress    Management of chronic medical conditions       Interventions Today    Flowsheet Row Most Recent Value  Chronic Disease   Chronic disease during today's visit Diabetes, Hypertension (HTN)  General Interventions   General Interventions Discussed/Reviewed General Interventions Reviewed, Doctor Visits, Durable Medical Equipment (DME)  Doctor Visits Discussed/Reviewed Doctor Visits Reviewed, PCP, Specialist  [Reviewed upcoming: pharmacy team on 2/7, PCP on 4/1]  Durable Medical Equipment (DME) Glucomoter, BP Cuff  [Advised daughter to confirm patient has DME to check blood sugars and blood pressure]  PCP/Specialist Visits Compliance with follow-up visit  Education Interventions   Education Provided Provided Education  Provided Verbal Education On When to see the doctor, Blood Sugar Monitoring, Medication, Labs  [Attempted to review medications, daughter will access MyChart and review meds in the home.  She will let this RNCM know if anything is missing]  Labs Reviewed Hgb A1c  [Recent A1C above goal at 8.6]              SDOH assessments and interventions completed:  No     Care Coordination Interventions:  Yes, provided   Follow up plan: Follow up call scheduled for 2/12    Encounter Outcome:  Patient Visit Completed   Odella Ku, RN, MSN, CCM [place]  [facility], [facility] Health RN Care Coordinator Direct Dial: [phone] / Main [phone] Fax [phone] Email: [email or web address]

## 2024-01-26 NOTE — Patient Instructions (Signed)
 Visit Information  Thank you for taking time to visit with me today. Please don't hesitate to contact me if I can be of assistance to you before our next scheduled telephone appointment.  Following are the goals we discussed today:  Make sure you have blood pressure machine and blood sugar machine   Check BP and sugars daily Check medication list and compare to what you have in the home.  Our next appointment is by telephone on 2/12  Please call the care guide team at [phone] if you need to cancel or reschedule your appointment.   Please call the Suicide and Crisis Lifeline: 988 call the [place]  National Suicide Prevention Lifeline: 216-470-3399 or TTY: 340-439-6854 TTY 774-442-8907) to talk to a trained counselor call 1-800-273-TALK (toll free, 24 hour hotline) call 911 if you are experiencing a Mental Health or Behavioral Health Crisis or need someone to talk to.  Patient verbalizes understanding of instructions and care plan provided today and agrees to view in MyChart. Active MyChart status and patient understanding of how to access instructions and care plan via MyChart confirmed with patient.     The patient has been provided with contact information for the care management team and has been advised to call with any health related questions or concerns.   Odella Ku, RN, MSN, CCM [facility], [facility] Health RN Care Coordinator Direct Dial: [phone] / Main [phone] Fax [phone] Email: [email or web address]

## 2024-01-28 ENCOUNTER — Other Ambulatory Visit: Payer: Self-pay

## 2024-01-28 NOTE — Progress Notes (Signed)
   01/28/2024  Patient ID: Lucas Weiss, male   DOB: 28-Sep-1956, 68 y.o.   MRN: [id number]  Subjective/objective Telephone visit to follow-up on management of type 2 diabetes  Diabetes management plan -Current medications: Metformin  XR 500 mg daily, glipizide  5 mg twice daily with meals -Patient was approved for [employer] PAP for Farxiga , but he states he was not able to tolerate this medication and has not taken it in at least 1 month.  Patient endorses Farxiga  causes significant thirst and excessive urination. -Patient has been approved for [employer] PAP to receive Ozempic  0.25/0.5 mg.  The medication was delivered to Dr. Ferdie office, and he has picked this up.  He plans to take his first dose of Ozempic  0.25 mg Sunday. -Patient would like to be able to come off of metformin  in the future due to adverse GI side effects from this medication. -Most recent A1c 8.6% -Patient does monitor home blood glucose and endorses FBG averaging 100, postprandial 200 (unclear how long after eating these values are checked)  Assessment/plan  Diabetes management plan -Currently uncontrolled -Continue metformin  XR 500 mg daily and glipizide  5 mg twice daily before meals -Initiate Ozempic  0.25 mg weekly for 4 weeks, then increase to 0.5 mg weekly -Goal to titrate Ozempic  as tolerated up to 1 or 2 mg weekly dose to be able to stop metformin  -Contacting [employer] to cancel any future refills of Farxiga   Follow-up: 1 month  Channing DELENA Mealing, PharmD, DPLA

## 2024-01-30 ENCOUNTER — Encounter: Payer: Self-pay | Admitting: Family Medicine

## 2024-02-02 ENCOUNTER — Ambulatory Visit: Payer: Self-pay | Admitting: *Deleted

## 2024-02-02 NOTE — Patient Instructions (Signed)
Visit Information  Thank you for taking time to visit with me today. Please don't hesitate to contact me if I can be of assistance to you before our next scheduled telephone appointment.  Following are the goals we discussed today:  Continue checking blood sugars daily. Continue Ozempic as instructed, notify  MD if GI issues does not subside.  Decrease salt and sugar intake, decrease intake of energy drinks.   Our next appointment is by telephone on 4/3  Please call the care guide team at [phone] if you need to cancel or reschedule your appointment.   Please call the Suicide and Crisis Lifeline: 988 call the [place] National Suicide Prevention Lifeline: (216)689-9236 or TTY: 306-603-9424 TTY 720-069-9467) to talk to a trained counselor call 1-800-273-TALK (toll free, 24 hour hotline) call 911 if you are experiencing a Mental Health or Behavioral Health Crisis or need someone to talk to.  Patient verbalizes understanding of instructions and care plan provided today and agrees to view in MyChart. Active MyChart status and patient understanding of how to access instructions and care plan via MyChart confirmed with patient.     The patient has been provided with contact information for the care management team and has been advised to call with any health related questions or concerns.   Rodney Langton, RN, MSN, CCM [facility] Health RN Care Coordinator Direct Dial: [phone] / Main [phone] Fax [phone] Email: [email or web address]

## 2024-02-02 NOTE — Patient Outreach (Signed)
  Care Coordination   Follow Up Visit Note   02/02/2024 Name: Lucas Weiss MRN: [id number] DOB: 12/22/1955  Lucas Weiss is a 68 y.o. year old male who sees Dorcas Carrow, DO for primary care. I spoke with Lucas Weiss, daughter of Lucas Weiss by phone today.  What matters to the patients health and wellness today?  Daughter report she and patient now have access to MyChart and they are working on better medication management to keep medical conditions more stable. Denies any urgent concerns, encouraged to contact this care manager with questions.      Goals Addressed             This Visit's Progress    Management of chronic medical conditions   On track    Interventions Today    Flowsheet Row Most Recent Value  Chronic Disease   Chronic disease during today's visit Diabetes, Hypertension (HTN)  General Interventions   General Interventions Discussed/Reviewed General Interventions Reviewed, Doctor Visits, Durable Medical Equipment (DME)  Doctor Visits Discussed/Reviewed Doctor Visits Reviewed, PCP  Annabell Sabal upcoming: Pharmacist on 3/7 and PCP on 4/1]  Durable Medical Equipment (DME) Glucomoter, BP Cuff  PCP/Specialist Visits Compliance with follow-up visit  Education Interventions   Education Provided Provided Education  Provided Verbal Education On Blood Sugar Monitoring, Medication, Nutrition, When to see the doctor  [facility] reviewed, has started taking Ozempic, but having GI issues. Educated this may happen during initial adjustment period (first dose 3 days ago), encouraged to continue and notify MD if symptoms does not improve]  Nutrition Interventions   Nutrition Discussed/Reviewed Nutrition Reviewed, Adding fruits and vegetables, Decreasing salt, Decreasing sugar intake  [Daughter report patient is "hard headed" in regards to his diet. Eats ham daily along with indulging in energy drinks.  She worked with a dietary team for 15 years, will continue to encourage better diet  habits]              SDOH assessments and interventions completed:  No     Care Coordination Interventions:  Yes, provided   Follow up plan: Follow up call scheduled for 4/3    Encounter Outcome:  Patient Visit Completed   Rodney Langton, RN, MSN, CCM [place]  [facility], [facility] Health RN Care Coordinator Direct Dial: [phone] / Main [phone] Fax [phone] Email: [email or web address]

## 2024-02-21 ENCOUNTER — Telehealth: Payer: Self-pay

## 2024-02-21 NOTE — Telephone Encounter (Signed)
 Patient was sent in Ozempic 0.25mg /0.5mg  weekly.  He has to dial to the 0.25mg  or to 0.5mg  on the pen.  The only way to know if the dose is the same is to see what he is dialing it to.  We can set him up with a nurse visit to verify the dose if he would like.

## 2024-02-21 NOTE — Telephone Encounter (Unsigned)
 Copied from CRM [id number]. Topic: Clinical - Medication Question >> Feb 21, 2024 12:31 PM Geroge Baseman wrote: Reason for CRM: Patient called to make sure his dosage for his ozempic is the same every time he takes it. Also ays he's feeling bad side effects from the shot. Refused nurse triage. Would like to speak with someone in office about his experience so far please call ASAP

## 2024-02-23 NOTE — Telephone Encounter (Signed)
 Called and spoke with patient. Discussed with patient that he is to be dialing to the .25 mg on his pen to take once per week. Patient states that he has been doing this. States he has been feeling very tired and fatigued since starting the medication. States he has no energy to do anything. Has an appointment scheduled to follow up with Elnita Maxwell on Friday and with Dr. Laural Benes next month.

## 2024-02-25 ENCOUNTER — Other Ambulatory Visit: Payer: Self-pay

## 2024-02-25 NOTE — Progress Notes (Signed)
   02/25/2024  Patient ID: Lucas Weiss, male   DOB: 03-10-1956, 68 y.o.   MRN: [id number]  Subjective/objective Telephone visit to follow-up on management of type 2 diabetes  Diabetes management plan -Current medications:  metformin XR 500mg  daily, Ozempic 0.25mg  weekly -Unable to tolerate Rybelsus (GI upset) or Farxiga (dehydration) -Patient does monitor home BG but not regularly -Has had 4 doses of Ozempic 0.25mg - had stomach upset after the first 2-3 injections but states this has improved  Assessment/plan  Diabetes management plan -Increase Ozempic to 0.5mg  weekly with upcoming dose Sunday -Continue metformin XR 500mg  daily -Monitor and record home BG regularly  Follow-up: 2 weeks  Lenna Gilford, PharmD, DPLA

## 2024-03-02 ENCOUNTER — Other Ambulatory Visit: Payer: Self-pay | Admitting: Family Medicine

## 2024-03-02 NOTE — Telephone Encounter (Signed)
 Requested Prescriptions  Pending Prescriptions Disp Refills   metFORMIN (GLUCOPHAGE-XR) 500 MG 24 hr tablet [Pharmacy Med Name: METFORMIN HCL ER 500 MG TABLET] 90 tablet 0    Sig: TAKE 1 TABLET BY MOUTH DAILY WITH BREAKFAST     Endocrinology:  Diabetes - Biguanides Failed - 03/02/2024  1:35 PM      Failed - Cr in normal range and within 360 days    Creatinine  Date Value Ref Range Status  06/02/2012 1.10 0.60 - 1.30 mg/dL Final   Creatinine, Ser  Date Value Ref Range Status  01/18/2024 1.70 (H) 0.76 - 1.27 mg/dL Final         Failed - HBA1C is between 0 and 7.9 and within 180 days    HB A1C (BAYER DCA - WAIVED)  Date Value Ref Range Status  12/17/2023 8.6 (H) 4.8 - 5.6 % Final    Comment:             Prediabetes: 5.7 - 6.4          Diabetes: >6.4          Glycemic control for adults with diabetes: <7.0          Failed - eGFR in normal range and within 360 days    EGFR (African American)  Date Value Ref Range Status  06/02/2012 >60  Final   GFR calc Af Amer  Date Value Ref Range Status  10/08/2020 81 >59 mL/min/1.73 Final    Comment:    **In accordance with recommendations from the NKF-ASN Task force,**   [facility] is in the process of updating its eGFR calculation to the   2021 CKD-EPI creatinine equation that estimates kidney function   without a race variable.    EGFR (Non-African Amer.)  Date Value Ref Range Status  06/02/2012 >60  Final    Comment:    eGFR values <19mL/min/1.73 m2 may be an indication of chronic kidney disease (CKD). Calculated eGFR is useful in patients with stable renal function. The eGFR calculation will not be reliable in acutely ill patients when serum creatinine is changing rapidly. It is not useful in  patients on dialysis. The eGFR calculation may not be applicable to patients at the low and high extremes of body sizes, pregnant women, and vegetarians.    GFR calc non Af Amer  Date Value Ref Range Status  10/08/2020 70 >59  mL/min/1.73 Final   eGFR  Date Value Ref Range Status  01/18/2024 44 (L) >59 mL/min/1.73 Final         Failed - B12 Level in normal range and within 720 days    No results found for: "VITAMINB12"       Passed - Valid encounter within last 6 months    Recent Outpatient Visits           1 month ago Hypertension associated with diabetes (HCC)   [facility] [place], Megan P, DO   2 months ago Uncontrolled type 2 diabetes mellitus with hyperglycemia, without long-term current use of insulin (HCC)   [place] [facility] [place], Megan P, DO   5 months ago Encounter for [employer] annual wellness exam   [place] [facility] [place], Megan P, DO   7 months ago Uncontrolled type 2 diabetes mellitus with hyperglycemia, without long-term current use of insulin [facility])    [facility], Megan P, DO   9 months ago Left upper quadrant abdominal pain  [place] [facility] [place], Oswaldo Conroy, PA-C       Future Appointments             In 2 weeks Laural Benes, Oralia Rud, DO [facility]            Passed - CBC within normal limits and completed in the last 12 months    WBC  Date Value Ref Range Status  12/17/2023 8.0 3.4 - 10.8 x10E3/uL Final  05/21/2016 8.3 4.0 - 10.5 K/uL Final   RBC  Date Value Ref Range Status  12/17/2023 4.30 4.14 - 5.80 x10E6/uL Final  05/21/2016 3.19 (L) 4.22 - 5.81 MIL/uL Final   Hemoglobin  Date Value Ref Range Status  12/17/2023 12.2 (L) 13.0 - 17.7 g/dL Final   Hematocrit  Date Value Ref Range Status  12/17/2023 36.9 (L) 37.5 - 51.0 % Final   MCHC  Date Value Ref Range Status  12/17/2023 33.1 31.5 - 35.7 g/dL Final  19/14/7829 56.2 30.0 - 36.0 g/dL Final   [facility]  Date Value Ref Range Status  12/17/2023 28.4 26.6 - 33.0 pg Final  05/21/2016 27.6 26.0 - 34.0 pg Final   MCV  Date Value Ref Range Status  12/17/2023 86 79 - 97 fL  Final   No results found for: "PLTCOUNTKUC", "LABPLAT", "POCPLA" RDW  Date Value Ref Range Status  12/17/2023 14.0 11.6 - 15.4 % Final

## 2024-03-05 ENCOUNTER — Other Ambulatory Visit: Payer: Self-pay | Admitting: Family Medicine

## 2024-03-07 NOTE — Telephone Encounter (Signed)
 Dc'd 12/17/23 Dr Laural Benes  Requested Prescriptions  Refused Prescriptions Disp Refills   glipiZIDE (GLUCOTROL) 5 MG tablet [Pharmacy Med Name: glipiZIDE 5 MG TABLET] 180 tablet 0    Sig: TAKE 1 TABLET BY MOUTH 2 TIMES A DAY BEFORE A MEAL     Endocrinology:  Diabetes - Sulfonylureas Failed - 03/07/2024 10:19 AM      Failed - HBA1C is between 0 and 7.9 and within 180 days    HB A1C (BAYER DCA - WAIVED)  Date Value Ref Range Status  12/17/2023 8.6 (H) 4.8 - 5.6 % Final    Comment:             Prediabetes: 5.7 - 6.4          Diabetes: >6.4          Glycemic control for adults with diabetes: <7.0          Failed - Cr in normal range and within 360 days    Creatinine  Date Value Ref Range Status  06/02/2012 1.10 0.60 - 1.30 mg/dL Final   Creatinine, Ser  Date Value Ref Range Status  01/18/2024 1.70 (H) 0.76 - 1.27 mg/dL Final         Passed - Valid encounter within last 6 months    Recent Outpatient Visits           1 month ago Hypertension associated with diabetes (HCC)   [place] [facility] [place], Megan P, DO   2 months ago Uncontrolled type 2 diabetes mellitus with hyperglycemia, without long-term current use of insulin (HCC)   [place] [facility] [place], Megan P, DO   5 months ago Encounter for [employer] annual wellness exam   [facility] [place], Megan P, DO   7 months ago Uncontrolled type 2 diabetes mellitus with hyperglycemia, without long-term current use of insulin (HCC)   [place] [facility] [place], Megan P, DO   9 months ago Left upper quadrant abdominal pain   [facility] Mecum, Oswaldo Conroy, PA-C       Future Appointments             In 2 weeks Laural Benes, Oralia Rud, DO  [facility]

## 2024-03-08 ENCOUNTER — Encounter: Payer: Self-pay | Admitting: Family Medicine

## 2024-03-10 ENCOUNTER — Other Ambulatory Visit: Payer: Self-pay

## 2024-03-10 MED ORDER — ONETOUCH VERIO VI STRP: ORAL_STRIP | 12 refills | Status: AC

## 2024-03-10 MED ORDER — GLIPIZIDE 5 MG PO TABS: 5.0000 mg | ORAL_TABLET | Freq: Two times a day (BID) | ORAL | 3 refills | Status: DC

## 2024-03-10 NOTE — Progress Notes (Signed)
   03/10/2024  Patient ID: Lucas Weiss, male   DOB: 1956/10/16, 68 y.o.   MRN: [id number]  Subjective/objective Telephone visit to follow-up on management of diabetes.  Patient and daughter, Lucas Weiss, present for phone call.  Patient provided verbal consent for daughter to be present for visit.  Diabetes management plan -Current medications:  metformin xr 500mg  daily with breakfast, Ozempic 0.5mg  weekly -Patient recently increased to Ozempic 0.5 mg weekly and endorses tolerating mostly well with some bad taste in his mouth thought to be from medication -Patient ran out of metformin for 4 days, and endorses improvement in GI upset and diarrhea that he has been experiencing for quite some time now.  Patient and daughter are inquiring about stopping metformin. -Patient does monitor blood glucose at home.  Recent FBG of 180, but patient has seen some postprandial blood glucose values of up to 400. -Patient endorses recent dietary choices are likely impacting blood glucose readings -Patient is needing a refill on test strips -Last A1c was 8.6% in December  Assessment/plan  Diabetes management plan -Discussed alternative medication options for metformin.  Patient did not tolerate SGLT2 therapies well in the past.  Thought glipizide had caused skin rash, but patient states this was actually from bug spray he was using on the skin. -Based on current blood glucose readings and desire to stop metformin due to adverse GI reaction, I recommend resuming glipizide at 5 mg twice daily with meals-prescription pending for Dr. Laural Benes to sign if in agreement -Goal to increase Ozempic gradually to 2 mg weekly in hopes of being able to come off of glipizide -Prescription refill for test strips pending for Dr. Laural Benes to sign -Patient sees Dr. Laural Benes again 4/1, and daughter plans to accompany for visit-patient due for follow-up A1c -Advised patient to monitor fasting blood glucose as well as 2-hour postprandial  blood glucose after 1 meal each day, record values and bring to follow-up visits  Follow up: 5/12  Lenna Gilford, PharmD, DPLA

## 2024-03-21 ENCOUNTER — Encounter: Payer: Self-pay | Admitting: Family Medicine

## 2024-03-21 ENCOUNTER — Ambulatory Visit (INDEPENDENT_AMBULATORY_CARE_PROVIDER_SITE_OTHER): Payer: Self-pay | Admitting: Family Medicine

## 2024-03-21 VITALS — BP 92/52 | Temp 98.5°F | Wt 174.4 lb

## 2024-03-21 DIAGNOSIS — E1165 Type 2 diabetes mellitus with hyperglycemia: Secondary | ICD-10-CM | POA: Diagnosis not present

## 2024-03-21 DIAGNOSIS — Z7985 Long-term (current) use of injectable non-insulin antidiabetic drugs: Secondary | ICD-10-CM | POA: Diagnosis not present

## 2024-03-21 DIAGNOSIS — E1159 Type 2 diabetes mellitus with other circulatory complications: Secondary | ICD-10-CM | POA: Diagnosis not present

## 2024-03-21 DIAGNOSIS — I152 Hypertension secondary to endocrine disorders: Secondary | ICD-10-CM

## 2024-03-21 LAB — BAYER DCA HB A1C WAIVED: HB A1C (BAYER DCA - WAIVED): 8.2 % — ABNORMAL HIGH (ref 4.8–5.6)

## 2024-03-21 NOTE — Assessment & Plan Note (Signed)
 Hypotensive on recheck. Stop amlodipine. Recheck in 6 weeks.

## 2024-03-21 NOTE — Patient Instructions (Addendum)
 STOP AMLODIPINE STOP METFORMIN  KEEP TAKING 0.5mg  OZEMPIC WEEKLY (shot) KEEP TAKING METOPROLOL KEEP TAKING LISINOPRIL KEEP TAKING LOVASTATIN START TAKING GLIPIZIDE   Come see me in 1 month. CALL ME if your belly gets upset.

## 2024-03-21 NOTE — Assessment & Plan Note (Signed)
 Improved with A1c of 8.2 down from 8.6- will maintain 0.5mg  ozempic and start glipizide. Recheck 6 weeks. If tolerating without abdominal pain, will increase to 1mg  ozempic with goal of coming off glipizide. Call with any concerns.

## 2024-03-21 NOTE — Progress Notes (Signed)
 BP (!) 92/52   Temp 98.5 F (36.9 C)   Wt 174 lb 6.4 oz (79.1 kg)   SpO2 97%   BMI 25.75 kg/m    Subjective:    Patient ID: Lucas Weiss, male    DOB: Dec 21, 1956, 68 y.o.   MRN: [id number]  HPI: Lucas Weiss is a 68 y.o. male  Chief Complaint  Patient presents with   Diabetes    BGL 211 this morning   Hypertension   DIABETES- taking metformin and glipizide. Stopped the ozempic 3 days ago Hypoglycemic episodes:no Polydipsia/polyuria: no Visual disturbance: no Chest pain: no Paresthesias: no Glucose Monitoring: no  Accucheck frequency: occasionally Taking Insulin?: no Blood Pressure Monitoring: not checking Retinal Examination: Not up to Date Foot Exam: Up to Date Diabetic Education: Completed Pneumovax: Up to Date Influenza: Up to Date Aspirin: yes  HYPERTENSION  Hypertension status: overtreated  Satisfied with current treatment? no Duration of hypertension: chronic BP monitoring frequency:  rarely BP medication side effects:  no Medication compliance: excellent compliance Previous BP meds:amlodipine, metoprolol, lisinopril Aspirin: yes Recurrent headaches: no Visual changes: no Palpitations: no Dyspnea: no Chest pain: no Lower extremity edema: no Dizzy/lightheaded: yes  Relevant past medical, surgical, family and social history reviewed and updated as indicated. Interim medical history since our last visit reviewed. Allergies and medications reviewed and updated.  Review of Systems  Constitutional:  Positive for fatigue. Negative for activity change, appetite change, chills, diaphoresis, fever and unexpected weight change.  Respiratory: Negative.    Cardiovascular: Negative.   Neurological:  Positive for dizziness. Negative for tremors, seizures, syncope, facial asymmetry, speech difficulty, weakness, light-headedness, numbness and headaches.  Hematological: Negative.   Psychiatric/Behavioral: Negative.      Per HPI unless specifically indicated  above     Objective:    BP (!) 92/52   Temp 98.5 F (36.9 C)   Wt 174 lb 6.4 oz (79.1 kg)   SpO2 97%   BMI 25.75 kg/m   Wt Readings from Last 3 Encounters:  03/21/24 174 lb 6.4 oz (79.1 kg)  01/18/24 184 lb (83.5 kg)  12/17/23 180 lb 12.8 oz (82 kg)    Physical Exam Vitals and nursing note reviewed.  Constitutional:      General: He is not in acute distress.    Appearance: Normal appearance. He is not ill-appearing, toxic-appearing or diaphoretic.  HENT:     Head: Normocephalic and atraumatic.     Right Ear: External ear normal.     Left Ear: External ear normal.     Nose: Nose normal.     Mouth/Throat:     Mouth: Mucous membranes are moist.     Pharynx: Oropharynx is clear.  Eyes:     General: No scleral icterus.       Right eye: No discharge.        Left eye: No discharge.     Extraocular Movements: Extraocular movements intact.     Conjunctiva/sclera: Conjunctivae normal.     Pupils: Pupils are equal, round, and reactive to light.  Cardiovascular:     Rate and Rhythm: Normal rate and regular rhythm.     Pulses: Normal pulses.     Heart sounds: Normal heart sounds. No murmur heard.    No friction rub. No gallop.  Pulmonary:     Effort: Pulmonary effort is normal. No respiratory distress.     Breath sounds: Normal breath sounds. No stridor. No wheezing, rhonchi or rales.  Chest:  Chest wall: No tenderness.  Musculoskeletal:        General: Normal range of motion.     Cervical back: Normal range of motion and neck supple.  Skin:    General: Skin is warm and dry.     Capillary Refill: Capillary refill takes less than 2 seconds.     Coloration: Skin is not jaundiced or pale.     Findings: No bruising, erythema, lesion or rash.  Neurological:     General: No focal deficit present.     Mental Status: He is alert and oriented to person, place, and time. Mental status is at baseline.  Psychiatric:        Mood and Affect: Mood normal.        Behavior: Behavior  normal.        Thought Content: Thought content normal.        Judgment: Judgment normal.     Results for orders placed or performed in visit on 01/18/24  Basic metabolic panel   Collection Time: 01/18/24  2:20 PM  Result Value Ref Range   Glucose 235 (H) 70 - 99 mg/dL   BUN 32 (H) 8 - 27 mg/dL   Creatinine, Ser 3.66 (H) 0.76 - 1.27 mg/dL   eGFR 44 (L) >44 IH/KVQ/2.59   BUN/Creatinine Ratio 19 10 - 24   Sodium 142 134 - 144 mmol/L   Potassium 4.7 3.5 - 5.2 mmol/L   Chloride 106 96 - 106 mmol/L   CO2 19 (L) 20 - 29 mmol/L   Calcium 10.3 (H) 8.6 - 10.2 mg/dL      Assessment & Plan:   Problem List Items Addressed This Visit       Cardiovascular and Mediastinum   Hypertension associated with diabetes (HCC)   Hypotensive on recheck. Stop amlodipine. Recheck in 6 weeks.        Endocrine   Uncontrolled type 2 diabetes mellitus with hyperglycemia, without long-term current use of insulin (HCC) - Primary   Improved with A1c of 8.2 down from 8.6- will maintain 0.5mg  ozempic and start glipizide. Recheck 6 weeks. If tolerating without abdominal pain, will increase to 1mg  ozempic with goal of coming off glipizide. Call with any concerns.      Relevant Orders   Bayer DCA Hb A1c Waived     Follow up plan: Return in about 6 weeks (around 05/02/2024).

## 2024-03-23 ENCOUNTER — Ambulatory Visit: Payer: Self-pay | Admitting: *Deleted

## 2024-03-23 NOTE — Patient Outreach (Signed)
 Care Coordination   Follow Up Visit Note   03/23/2024 Name: Lucas Weiss MRN: [id number] DOB: [birth year]  MAXWEL MEADOWCROFT is a 68 y.o. year old male who sees Dorcas Carrow, DO for primary care. I spoke with  [name] and daughter Hospital doctor by phone today.  What matters to the patients health and wellness today?  Patient report he felt a little sick yesterday (GI upset) but this was due to restarting Ozempic after missing a week. Daughter report better understanding of medication regime after attending MD visit with him this week.  She will continue to be supportive.  Denies any urgent concerns, encouraged to contact this care manager with questions.      Goals Addressed             This Visit's Progress    Management of chronic medical conditions   On track    Interventions Today    Flowsheet Row Most Recent Value  Chronic Disease   Chronic disease during today's visit Diabetes, Hypertension (HTN)  General Interventions   General Interventions Discussed/Reviewed Labs, General Interventions Reviewed, Doctor Visits, Durable Medical Equipment (DME), Level of Care  Labs Hgb A1c every 3 months  [recently decreased to 8.2]  Doctor Visits Discussed/Reviewed Doctor Visits Reviewed  [upcoming with pharmacy team 5/12 and PCP 5/27]  Durable Medical Equipment (DME) Glucomoter, BP Cuff  [Reminded to monitor BP and glucose daily]  Level of Care Applications  Applications Medicaid  [State he is wanting to apply for Medicaid, but income is greater than 1500/month. Advised he will not qualify for full Medicaid, but may qualify for some benefits. Daughter and granddaughter will help him apply]  Exercise Interventions   Exercise Discussed/Reviewed Weight Managment  Weight Management Weight loss  [Has lost about 20 pounds since starting Ozempic]  Education Interventions   Education Provided Provided Education  Provided Verbal Education On When to see the doctor, Blood Sugar Monitoring, Labs,  Medication  [Meds reviewed, back on Ozempic, Metformin and Amlodipine stopped. Daughter helping with medication management]  Labs Reviewed Hgb A1c  [Aware of goal less than 7, he is much improved from 9.9 in May of last year]  Applications Medicaid  [State he is wanting to apply for Medicaid, but income is greater than 1500/month. Advised he will not qualify for full Medicaid, but may qualify for some benefits. Daughter and granddaughter will help him apply]              SDOH assessments and interventions completed:  No     Care Coordination Interventions:  Yes, provided   Follow up plan: Follow up call scheduled for 5/2 with F. McCray, RNCM. Patient aware    Encounter Outcome:  Patient Visit Completed   Rodney Langton, RN, MSN, CCM [place]  [facility], [facility] RN Care Coordinator Direct Dial: [phone] / Main [phone] Fax [phone] Email: [email or web address]

## 2024-04-21 ENCOUNTER — Other Ambulatory Visit: Payer: Self-pay

## 2024-04-21 NOTE — Patient Outreach (Unsigned)
Error: Duplicate Entry

## 2024-04-21 NOTE — Patient Instructions (Signed)
Thank you for allowing the Chronic Care Management team to participate in your care.  

## 2024-04-22 NOTE — Patient Outreach (Signed)
 Complex Care Management   Visit Note  04/22/2024  Name:  Lucas Weiss MRN: [id number] DOB: 06-14-1956  Situation: Referral received for Complex Care Management related to  HTN, HLD and DM.  I obtained verbal consent from Patient.  Visit completed with Mr. Vanweelden via telephone.  Background:   Past Medical History:  Diagnosis Date   Alcohol abuse, in remission    Avascular necrosis of bone of left hip (HCC) 10/15   Total hip replacement recommended by ortho, vascular says it's OK   Avascular necrosis of bone of left hip (HCC)    Total hip replacement recommended by ortho, vascular says he can have it done.     Bleeding hemorrhoids    Broken ribs 09/27/2020   CHF (congestive heart failure) (HCC)    Chronic low back pain    Complication of anesthesia    "woke up twice during procedures @ [facility]" (09/15/2012)   Coronary artery disease    Diabetes mellitus without complication (HCC) 01/09/16   A1c 6.8   Diverticulosis of sigmoid colon    DVT (deep venous thrombosis) (HCC)    Dyslexia    H/O hiatal hernia    Head injury, acute, with loss of consciousness (HCC)    as a child   High cholesterol    Hypertension    Impaired fasting glucose    Last A1c 6.1   Myocardial infarction [facility])    Neuropathy    PAD (peripheral artery disease) (HCC)    07/2012:  right ABI 0.64, left ABI 0.57.   Pneumonia    history of    Primary osteoarthritis of left hip    Renal artery stenosis (HCC)    Bilateral    Severe recurrent major depression without psychotic features (HCC)    Shortness of breath dyspnea    with exertion - due to pain   Stroke [facility]) 2009   "after CABG; ~ blind left eye since" (09/15/2012)- memory loss    Tobacco abuse    Quit in 2009    Assessment: Patient Reported Symptoms: Cognitive Cognitive Status: Alert and oriented to person, place, and time, Normal speech and language skills Cognitive/Intellectual Conditions Management [RPT]: None reported or documented in medical  history or problem list   Health Maintenance Behaviors: Annual physical exam, Social activities, Stress management Healing Pattern: Average Health Facilitated by: Rest, Stress management, Pain control  Neurological Neurological Review of Symptoms: No symptoms reported Neurological Conditions:  (History of Stroke) Neurological Management Strategies: Adequate rest, Coping strategies, Medication therapy, Routine screening Neurological Self-Management Outcome: 4 (good)  HEENT HEENT Symptoms Reported: No symptoms reported HEENT Conditions: Tooth problem(s), Vision problem(s) Tooth Problems: missing (Reports plan to be fitted for dentures. Currenlty pending Therapist, occupational.) Vision Problems: blindness/vision loss (Reports decreased vision in left eye following the stroke) HEENT Management Strategies: Routine screening, Coping strategies HEENT Self-Management Outcome: 4 (good) Tooth problem(s), Vision problem(s)  Cardiovascular Cardiovascular Symptoms Reported: No symptoms reported Cardiovascular Conditions: Coronary artery disease, High blood cholesterol, Hypertension, Myocardial infarction Cardiovascular Management Strategies: Medication therapy, Medical device, Diet modification, Routine screening Cardiovascular Self-Management Outcome: 4 (good)  Respiratory Respiratory Symptoms Reported: No symptoms reported Respiratory Comment: No Symptoms Reported  Endocrine Patient reports the following symptoms related to hypoglycemia or hyperglycemia : Nausea or vomiting Is patient diabetic?: Yes Is patient checking blood sugars at home?: Yes Endocrine Conditions: Diabetes Endocrine Management Strategies: Routine screening, Diet modification, Medical device, Medication therapy Endocrine Self-Management Outcome: 4 (good) Endocrine Comment: Reports significant improvement with blood sugar readings.  Reports previous fasting readings were ranging in the 300 to 400s. Reports recent readings have ranged in  the 100s to 110s.  Gastrointestinal Gastrointestinal Symptoms Reported: No symptoms reported Gastrointestinal Comment: No symptoms Reported Nutrition Risk Screen (CP): No indicators present  Genitourinary Genitourinary Symptoms Reported: No symptoms reported Genitourinary Comment: No Symptoms Reported  Integumentary Integumentary Symptoms Reported: No symptoms reported Skin Management Strategies: Routine screening Skin Comment: No symptoms reported  Musculoskeletal Musculoskelatal Symptoms Reviewed: No symptoms reported Musculoskeletal Conditions: Joint pain, Other (Reports history of left hip replacement) Other Musculoskeletal Conditions: Reports no longer requiring an assistive device with ambulation Musculoskeletal Management Strategies: Medication therapy, Routine screening Musculoskeletal Self-Management Outcome: 4 (good) Falls in the past year?: No Number of falls in past year: 1 or less Was there an injury with Fall?: No Fall Risk Category Calculator: 0 Patient Fall Risk Level: Low Fall Risk Patient at Risk for Falls Due to: Impaired vision, Medication side effect, Other (Comment), Orthopedic patient (History of stroke) Fall risk Follow up: Falls prevention discussed  Psychosocial Psychosocial Symptoms Reported: No symptoms reported Behavioral Health Conditions: Depression Behavioral Health Comment: No Symptoms Reported Major Change/Loss/Stressor/Fears (CP): Denies Quality of Family Relationships: supportive Do you feel physically threatened by others?: No      04/21/2024   12:45 PM  Depression screen PHQ 2/9  Decreased Interest 0  Down, Depressed, Hopeless 0  PHQ - 2 Score 0    There were no vitals filed for this visit.  Medications Reviewed Today     Reviewed by Roxie Cord, RN (Registered Nurse) on 04/21/24 at 1241  Med List Status: <None>   Medication Order Taking? Sig Documenting Provider Last Dose Status Informant  aspirin  EC 81 MG tablet [id number] No Take  81 mg by mouth daily. [provider] Taking Active            Med Note Finley Hugh, CHERYL A   Fri May 07, 2023 11:04 AM) Taking every once in a while  Blood Glucose Monitoring Suppl (ONETOUCH VERIO) w/Device KIT [id number] No To check blood sugar twice a day and document, bring to provider visits. Doran Galloway T, NP Taking Active   glipiZIDE  (GLUCOTROL ) 5 MG tablet [id number] No Take 1 tablet (5 mg total) by mouth 2 (two) times daily before a meal. Lincoln Renshaw, Megan P, DO Taking Active   glucose blood (ONETOUCH VERIO) test strip [id number] No To check blood sugar twice a day-before breakfast and 2 hours after a meal- and document, bring to provider visits. Terre Ferri P, DO Taking Active   Lancets [facility] ULTRASOFT) lancets [id number] No To check blood sugar twice a day and document, bring to provider visits. Cannady, Jolene T, NP Taking Active   lisinopril  (ZESTRIL ) 40 MG tablet [id number] No TAKE 1 TABLET BY MOUTH DAILY Solomon Dupre, DO Taking Active   lovastatin  (MEVACOR ) 40 MG tablet [id number] No TAKE 1 TABLET BY MOUTH AT BEDTIME Lincoln Renshaw, Megan P, DO Taking Active   metoprolol  tartrate (LOPRESSOR ) 50 MG tablet [id number] No TAKE 1 TABLET BY MOUTH DAILY Johnson, Megan P, DO Taking Active   Semaglutide ,0.25 or 0.5MG /DOS, (OZEMPIC , 0.25 OR 0.5 MG/DOSE,) 2 MG/3ML SOPN [id number] No Inject 0.25 mg into the skin once a week.  Patient not taking: Reported on 03/21/2024   Solomon Dupre, DO Not Taking Active            Med Note Finley Hugh, CHERYL A   Fri Dec 31, 2023  1:13 PM) PAP  Recommendation:   PCP Follow-up on May 16, 2026  Follow Up Plan:   Telephone follow up appointment on June 09, 2024   Roxie Cord [facility] RN Care Manager Direct Dial: [phone]  Fax: [phone] Website: [email or web address]

## 2024-05-01 ENCOUNTER — Other Ambulatory Visit

## 2024-05-03 ENCOUNTER — Other Ambulatory Visit: Payer: Self-pay

## 2024-05-03 NOTE — Progress Notes (Unsigned)
   05/03/2024  Patient ID: Lucas Weiss, male   DOB: 01-22-1956, 68 y.o.   MRN: [id number]  Patient outreach attempt to follow-up on diabetes management was unsuccessful, and patient's voicemail is not set up.  I will attempt to reach the patient to check in again later this week or early next week.  Linn Rich, PharmD, DPLA

## 2024-05-05 ENCOUNTER — Telehealth: Payer: Self-pay

## 2024-05-05 NOTE — Telephone Encounter (Signed)
.  Patient contacted to inform their patient assistance medication has been received in the office. Due to limited space patient has been asked to picked up in the next 2 weeks. Our open office hours are Monday thru Friday 8:000 am to 12:00 pm and again 12:45 pm to 5:00 pm.   Medication information: Semaglutide ,0.25 or 0.5MG /DOS, (OZEMPIC , 0.25 OR 0.5 MG/DOSE,) 2 MG/3ML

## 2024-05-09 ENCOUNTER — Other Ambulatory Visit: Payer: Self-pay

## 2024-05-10 ENCOUNTER — Other Ambulatory Visit: Payer: Self-pay

## 2024-05-10 NOTE — Progress Notes (Signed)
   05/10/2024  Patient ID: Lucas Weiss, male   DOB: Jul 14, 1956, 68 y.o.   MRN: [id number]  Subjective/objective Telephone visit to follow-up on management of diabetes.     Diabetes management plan -Current medications:  glipizide  5mg  BID, Ozempic  0.5mg  weekly -Patient endorses tolerating Ozempic  well with improvement in GI upset since stopping metformin  -States he is feeling better overall recently -Did not tolerate SGLT2 therapy in the past -Patient has not been monitoring FBG daily but states when he does check it is averaging 130-140 -Does not endorse s/sx of hypogycemia -Last A1c was 8.2% on 4/1, down  from 8.6% in December   Assessment/plan   Diabetes management plan -Continue current medication regimen at this time -Begin monitoring FBG daily again; record values and bring to upcoming 5/27 PCP f/u visit -Based on BG readings and continued tolerance of Ozempic  0.5mg  weekly, I recommend increasing Ozempic  to 1mg  weekly and adjusting glipizide  accordingly.  I can assist with order change form for Novo PAP; and patient can either continue 0.5mg  weekly until 1mg  pens arrive, or begin using 2 injections of 0.5mg  weekly -Not due for A1c again until 06/20/2024   Follow up: 4 weeks   Linn Rich, PharmD, DPLA

## 2024-05-16 ENCOUNTER — Ambulatory Visit: Admitting: Family Medicine

## 2024-05-31 ENCOUNTER — Other Ambulatory Visit: Payer: Self-pay | Admitting: Family Medicine

## 2024-06-01 NOTE — Telephone Encounter (Signed)
 Requested Prescriptions  Refused Prescriptions Disp Refills   metFORMIN  (GLUCOPHAGE -XR) 500 MG 24 hr tablet [Pharmacy Med Name: METFORMIN  HCL ER 500 MG TABLET] 90 tablet 0    Sig: TAKE 1 TABLET BY MOUTH DAILY WITH BREAKFAST     Endocrinology:  Diabetes - Biguanides Failed - 06/01/2024  1:44 PM      Failed - Cr in normal range and within 360 days    Creatinine  Date Value Ref Range Status  06/02/2012 1.10 0.60 - 1.30 mg/dL Final   Creatinine, Ser  Date Value Ref Range Status  01/18/2024 1.70 (H) 0.76 - 1.27 mg/dL Final         Failed - HBA1C is between 0 and 7.9 and within 180 days    HB A1C (BAYER DCA - WAIVED)  Date Value Ref Range Status  03/21/2024 8.2 (H) 4.8 - 5.6 % Final    Comment:             Prediabetes: 5.7 - 6.4          Diabetes: >6.4          Glycemic control for adults with diabetes: <7.0          Failed - eGFR in normal range and within 360 days    EGFR (African American)  Date Value Ref Range Status  06/02/2012 >60  Final   GFR calc Af Amer  Date Value Ref Range Status  10/08/2020 81 >59 mL/min/1.73 Final    Comment:    **In accordance with recommendations from the NKF-ASN Task force,**   [facility] is in the process of updating its eGFR calculation to the   2021 CKD-EPI creatinine equation that estimates kidney function   without a race variable.    EGFR (Non-African Amer.)  Date Value Ref Range Status  06/02/2012 >60  Final    Comment:    eGFR values <30mL/min/1.73 m2 may be an indication of chronic kidney disease (CKD). Calculated eGFR is useful in patients with stable renal function. The eGFR calculation will not be reliable in acutely ill patients when serum creatinine is changing rapidly. It is not useful in  patients on dialysis. The eGFR calculation may not be applicable to patients at the low and high extremes of body sizes, pregnant women, and vegetarians.    GFR calc non Af Amer  Date Value Ref Range Status  10/08/2020 70 >59  mL/min/1.73 Final   eGFR  Date Value Ref Range Status  01/18/2024 44 (L) >59 mL/min/1.73 Final         Failed - B12 Level in normal range and within 720 days    No results found for: VITAMINB12       Passed - Valid encounter within last 6 months    Recent Outpatient Visits           2 months ago Uncontrolled type 2 diabetes mellitus with hyperglycemia, without long-term current use of insulin (HCC)   [place] [facility] [place], Megan P, DO              Passed - CBC within normal limits and completed in the last 12 months    WBC  Date Value Ref Range Status  12/17/2023 8.0 3.4 - 10.8 x10E3/uL Final  05/21/2016 8.3 4.0 - 10.5 K/uL Final   RBC  Date Value Ref Range Status  12/17/2023 4.30 4.14 - 5.80 x10E6/uL Final  05/21/2016 3.19 (L) 4.22 - 5.81 MIL/uL Final   Hemoglobin  Date Value Ref  Range Status  12/17/2023 12.2 (L) 13.0 - 17.7 g/dL Final   Hematocrit  Date Value Ref Range Status  12/17/2023 36.9 (L) 37.5 - 51.0 % Final   MCHC  Date Value Ref Range Status  12/17/2023 33.1 31.5 - 35.7 g/dL Final  16/09/9603 54.0 30.0 - 36.0 g/dL Final   [facility]  Date Value Ref Range Status  12/17/2023 28.4 26.6 - 33.0 pg Final  05/21/2016 27.6 26.0 - 34.0 pg Final   MCV  Date Value Ref Range Status  12/17/2023 86 79 - 97 fL Final   No results found for: PLTCOUNTKUC, LABPLAT, POCPLA RDW  Date Value Ref Range Status  12/17/2023 14.0 11.6 - 15.4 % Final

## 2024-06-08 ENCOUNTER — Other Ambulatory Visit: Payer: Self-pay | Admitting: Family Medicine

## 2024-06-09 ENCOUNTER — Telehealth: Payer: Self-pay

## 2024-06-09 NOTE — Telephone Encounter (Signed)
 Requested by interface surescripts. Medication discontinued 03/21/24.  Requested Prescriptions  Refused Prescriptions Disp Refills   amLODipine  (NORVASC ) 5 MG tablet [Pharmacy Med Name: amLODIPine  BESYLATE 5 MG TAB] 90 tablet 1    Sig: TAKE 1 TABLET BY MOUTH DAILY     Cardiovascular: Calcium  Channel Blockers 2 Passed - 06/09/2024  4:05 PM      Passed - Last BP in normal range    BP Readings from Last 1 Encounters:  03/21/24 (!) 92/52         Passed - Last Heart Rate in normal range    Pulse Readings from Last 1 Encounters:  01/18/24 70         Passed - Valid encounter within last 6 months    Recent Outpatient Visits           2 months ago Uncontrolled type 2 diabetes mellitus with hyperglycemia, without long-term current use of insulin [facility])   [place] [facility] [place], [place], DO

## 2024-06-11 ENCOUNTER — Other Ambulatory Visit: Payer: Self-pay | Admitting: Family Medicine

## 2024-06-11 DIAGNOSIS — I110 Hypertensive heart disease with heart failure: Secondary | ICD-10-CM

## 2024-06-11 DIAGNOSIS — E782 Mixed hyperlipidemia: Secondary | ICD-10-CM

## 2024-06-12 ENCOUNTER — Other Ambulatory Visit: Payer: Self-pay

## 2024-06-12 NOTE — Progress Notes (Signed)
   06/12/2024  Patient ID: Elspeth CHRISTELLA Ferraris, male   DOB: [birth year], 68 y.o.   MRN: [id number]  Subjective/objective Telephone visit to follow-up on management of diabetes.     Diabetes management plan -Current medications:  glipizide  5mg  BID, Ozempic  0.5mg  weekly -Patient endorses tolerating Ozempic  well with improvement in GI upset since stopping metformin  -States he is feeling better overall recently -Did not tolerate SGLT2 therapy in the past -Patient has not been monitoring FBG regularly -Does not endorse s/sx of hypogycemia -Last A1c was 8.2% on 4/1, down  from 8.6% in December -Patient missed scheduled 5/27 visit   Assessment/plan   Diabetes management plan -Continue current medication regimen at this time -Begin monitoring FBG daily and recording values -Patient will call office to reschedule missed 5/27 visit -Due for A1c again 7/1 -If A1c >7% and patient continues to tolerate Ozempic  0.5mg  weekly, I recommend increasing Ozempic  to 1mg  weekly and adjusting glipizide  if needed.  I can assist with order change form for Novo PAP; and patient can either continue 0.5mg  weekly until 1mg  pens arrive, or begin using 2 injections of 0.5mg  weekly   Follow up: 1 month   Channing DELENA Mealing, PharmD, DPLA

## 2024-06-13 NOTE — Telephone Encounter (Signed)
 Requested Prescriptions  Pending Prescriptions Disp Refills   lovastatin  (MEVACOR ) 40 MG tablet [Pharmacy Med Name: LOVASTATIN  40 MG TABLET] 90 tablet 0    Sig: TAKE 1 TABLET BY MOUTH AT BEDTIME     Cardiovascular:  Antilipid - Statins 2 Failed - 06/13/2024  2:00 PM      Failed - Cr in normal range and within 360 days    Creatinine  Date Value Ref Range Status  06/02/2012 1.10 0.60 - 1.30 mg/dL Final   Creatinine, Ser  Date Value Ref Range Status  01/18/2024 1.70 (H) 0.76 - 1.27 mg/dL Final         Failed - Lipid Panel in normal range within the last 12 months    Cholesterol, Total  Date Value Ref Range Status  12/17/2023 233 (H) 100 - 199 mg/dL Final   LDL Chol Calc (NIH)  Date Value Ref Range Status  12/17/2023 133 (H) 0 - 99 mg/dL Final   HDL  Date Value Ref Range Status  12/17/2023 39 (L) >39 mg/dL Final   Triglycerides  Date Value Ref Range Status  12/17/2023 339 (H) 0 - 149 mg/dL Final         Passed - Patient is not pregnant      Passed - Valid encounter within last 12 months    Recent Outpatient Visits           2 months ago Uncontrolled type 2 diabetes mellitus with hyperglycemia, without long-term current use of insulin (HCC)   [place] [facility] [place], Megan P, DO               metoprolol  tartrate (LOPRESSOR ) 50 MG tablet [Pharmacy Med Name: METOPROLOL  TARTRATE 50 MG TAB] 90 tablet 0    Sig: TAKE 1 TABLET BY MOUTH DAILY     Cardiovascular:  Beta Blockers Passed - 06/13/2024  2:00 PM      Passed - Last BP in normal range    BP Readings from Last 1 Encounters:  03/21/24 (!) 92/52         Passed - Last Heart Rate in normal range    Pulse Readings from Last 1 Encounters:  01/18/24 70         Passed - Valid encounter within last 6 months    Recent Outpatient Visits           2 months ago Uncontrolled type 2 diabetes mellitus with hyperglycemia, without long-term current use of insulin (HCC)   [place] [facility] [place], Megan P, DO              Refused Prescriptions Disp Refills   amLODipine  (NORVASC ) 5 MG tablet [Pharmacy Med Name: amLODIPine  BESYLATE 5 MG TAB] 90 tablet 1    Sig: TAKE 1 TABLET BY MOUTH DAILY     Cardiovascular: Calcium  Channel Blockers 2 Passed - 06/13/2024  2:00 PM      Passed - Last BP in normal range    BP Readings from Last 1 Encounters:  03/21/24 (!) 92/52         Passed - Last Heart Rate in normal range    Pulse Readings from Last 1 Encounters:  01/18/24 70         Passed - Valid encounter within last 6 months    Recent Outpatient Visits           2 months ago Uncontrolled type 2 diabetes mellitus with hyperglycemia, without long-term current use of insulin (HCC)  Lucas Weiss [facility] [place], [place], [place]

## 2024-06-14 ENCOUNTER — Other Ambulatory Visit: Payer: Self-pay | Admitting: Family Medicine

## 2024-06-16 NOTE — Telephone Encounter (Signed)
 Requested medication (s) are due for refill today: yes to both   Requested medication (s) are on the active medication list: yes  Last refill:   Glipizide : 03/10/24 #60 3 RF Lisinopril : 01/04/24 #90 1 RF  Future visit scheduled:no  Notes to clinic: labs outside normal range   Requested Prescriptions  Pending Prescriptions Disp Refills   glipiZIDE  (GLUCOTROL ) 5 MG tablet [Pharmacy Med Name: glipiZIDE  5 MG TABLET] 180 tablet     Sig: TAKE 1 TABLET BY MOUTH 2 TIMES A DAY BEFORE A MEAL     Endocrinology:  Diabetes - Sulfonylureas Failed - 06/16/2024 10:05 AM      Failed - HBA1C is between 0 and 7.9 and within 180 days    HB A1C (BAYER DCA - WAIVED)  Date Value Ref Range Status  03/21/2024 8.2 (H) 4.8 - 5.6 % Final    Comment:             Prediabetes: 5.7 - 6.4          Diabetes: >6.4          Glycemic control for adults with diabetes: <7.0          Failed - Cr in normal range and within 360 days    Creatinine  Date Value Ref Range Status  06/02/2012 1.10 0.60 - 1.30 mg/dL Final   Creatinine, Ser  Date Value Ref Range Status  01/18/2024 1.70 (H) 0.76 - 1.27 mg/dL Final         Passed - Valid encounter within last 6 months    Recent Outpatient Visits           2 months ago Uncontrolled type 2 diabetes mellitus with hyperglycemia, without long-term current use of insulin (HCC)   [place] [facility] [place], Megan P, DO               lisinopril  (ZESTRIL ) 40 MG tablet [Pharmacy Med Name: LISINOPRIL  40 MG TABLET] 90 tablet 1    Sig: TAKE 1 TABLET BY MOUTH DAILY     Cardiovascular:  ACE Inhibitors Failed - 06/16/2024 10:05 AM      Failed - Cr in normal range and within 180 days    Creatinine  Date Value Ref Range Status  06/02/2012 1.10 0.60 - 1.30 mg/dL Final   Creatinine, Ser  Date Value Ref Range Status  01/18/2024 1.70 (H) 0.76 - 1.27 mg/dL Final         Passed - K in normal range and within 180 days    Potassium  Date Value Ref Range  Status  01/18/2024 4.7 3.5 - 5.2 mmol/L Final  06/02/2012 4.5 3.5 - 5.1 mmol/L Final         Passed - Patient is not pregnant      Passed - Last BP in normal range    BP Readings from Last 1 Encounters:  03/21/24 (!) 92/52         Passed - Valid encounter within last 6 months    Recent Outpatient Visits           2 months ago Uncontrolled type 2 diabetes mellitus with hyperglycemia, without long-term current use of insulin (HCC)    [facility] [place], [place], DO

## 2024-06-24 ENCOUNTER — Other Ambulatory Visit: Payer: Self-pay | Admitting: Family Medicine

## 2024-06-27 NOTE — Telephone Encounter (Signed)
 Requested Prescriptions  Pending Prescriptions Disp Refills   metFORMIN  (GLUCOPHAGE -XR) 500 MG 24 hr tablet [Pharmacy Med Name: METFORMIN  HCL ER 500 MG TABLET] 90 tablet 0    Sig: TAKE 1 TABLET BY MOUTH DAILY WITH BREAKFAST     Endocrinology:  Diabetes - Biguanides Failed - 06/27/2024  1:43 PM      Failed - Cr in normal range and within 360 days    Creatinine  Date Value Ref Range Status  06/02/2012 1.10 0.60 - 1.30 mg/dL Final   Creatinine, Ser  Date Value Ref Range Status  01/18/2024 1.70 (H) 0.76 - 1.27 mg/dL Final         Failed - HBA1C is between 0 and 7.9 and within 180 days    HB A1C (BAYER DCA - WAIVED)  Date Value Ref Range Status  03/21/2024 8.2 (H) 4.8 - 5.6 % Final    Comment:             Prediabetes: 5.7 - 6.4          Diabetes: >6.4          Glycemic control for adults with diabetes: <7.0          Failed - eGFR in normal range and within 360 days    EGFR (African American)  Date Value Ref Range Status  06/02/2012 >60  Final   GFR calc Af Amer  Date Value Ref Range Status  10/08/2020 81 >59 mL/min/1.73 Final    Comment:    **In accordance with recommendations from the NKF-ASN Task force,**   [facility] is in the process of updating its eGFR calculation to the   2021 CKD-EPI creatinine equation that estimates kidney function   without a race variable.    EGFR (Non-African Amer.)  Date Value Ref Range Status  06/02/2012 >60  Final    Comment:    eGFR values <58mL/min/1.73 m2 may be an indication of chronic kidney disease (CKD). Calculated eGFR is useful in patients with stable renal function. The eGFR calculation will not be reliable in acutely ill patients when serum creatinine is changing rapidly. It is not useful in  patients on dialysis. The eGFR calculation may not be applicable to patients at the low and high extremes of body sizes, pregnant women, and vegetarians.    GFR calc non Af Amer  Date Value Ref Range Status  10/08/2020 70 >59  mL/min/1.73 Final   eGFR  Date Value Ref Range Status  01/18/2024 44 (L) >59 mL/min/1.73 Final         Failed - B12 Level in normal range and within 720 days    No results found for: VITAMINB12       Passed - Valid encounter within last 6 months    Recent Outpatient Visits           3 months ago Uncontrolled type 2 diabetes mellitus with hyperglycemia, without long-term current use of insulin (HCC)   [place] [facility] [place], Megan P, DO              Passed - CBC within normal limits and completed in the last 12 months    WBC  Date Value Ref Range Status  12/17/2023 8.0 3.4 - 10.8 x10E3/uL Final  05/21/2016 8.3 4.0 - 10.5 K/uL Final   RBC  Date Value Ref Range Status  12/17/2023 4.30 4.14 - 5.80 x10E6/uL Final  05/21/2016 3.19 (L) 4.22 - 5.81 MIL/uL Final   Hemoglobin  Date Value Ref  Range Status  12/17/2023 12.2 (L) 13.0 - 17.7 g/dL Final   Hematocrit  Date Value Ref Range Status  12/17/2023 36.9 (L) 37.5 - 51.0 % Final   MCHC  Date Value Ref Range Status  12/17/2023 33.1 31.5 - 35.7 g/dL Final  93/98/7982 67.4 30.0 - 36.0 g/dL Final   [facility]  Date Value Ref Range Status  12/17/2023 28.4 26.6 - 33.0 pg Final  05/21/2016 27.6 26.0 - 34.0 pg Final   MCV  Date Value Ref Range Status  12/17/2023 86 79 - 97 fL Final   No results found for: PLTCOUNTKUC, LABPLAT, POCPLA RDW  Date Value Ref Range Status  12/17/2023 14.0 11.6 - 15.4 % Final

## 2024-07-03 ENCOUNTER — Telehealth: Payer: Self-pay | Admitting: Pharmacy Technician

## 2024-07-03 NOTE — Progress Notes (Signed)
   07/03/2024 Name: Lucas Weiss MRN: [id number] DOB: 07/04/1956  Patient is appearing on a report for [facility] [place] Metric Diabetes and last engaged with the clinical pharmacist to discuss diabetes on 06/12/2024. Contacted patient today to discuss diabetes management and completed medication review.   Diabetes Plan from last clinical pharmacist appointment:  Diabetes management plan -Continue current medication regimen at this time -Begin monitoring FBG daily and recording values -Patient will call office to reschedule missed 5/27 visit -Due for A1c again 7/1 -If A1c >7% and patient continues to tolerate Ozempic  0.5mg  weekly, I recommend increasing Ozempic  to 1mg  weekly and adjusting glipizide  if needed.  I can assist with order change form for Novo PAP; and patient can either continue 0.5mg  weekly until 1mg  pens arrive, or begin using 2 injections of 0.5mg  weekly  Follow up: 1 month(copy/paste from last note)   Medication Adherence Barriers Identified:  Patient made recommended medication changes per plan: Yes Patient informs he is taking Ozempic  0.5mg  once a week and Glipizide  5mg  twice a day  Access issues with any new medication or testing device: Yes Patient is receiving Ozempic  thru [employer] PAP. He informs he has been receiving shipments regularly. Per Dr Annemarie, patient last filled Glipzide on 06/16/24 for 90 days supply.  Patient is checking blood sugars as prescribed: Yes Patient informs he is checking blood sugars but was unable to provide any data as he was driving. He informs they have been normal. He informsI feel really good, best I've felt in a while.   Medication Adherence Barriers Addressed/Actions Taken:  Reviewed medication changes per plan from last clinical pharmacist note Patient Assistance for Ozempic   Patient Assistance Program approved  Reviewed instructions for monitoring blood sugars at home and reminded patient to keep a written log to review with  pharmacist Reminded patient of date/time of upcoming clinical pharmacist follow up and any upcoming PCP/specialists visits. Patient denies transportation barriers to the appointment. Yes //Next clinical pharmacist appointment is scheduled for: 07/12/2024  Kate Caddy, CPhT [facility] Office: [phone] Email: [email or web address]

## 2024-07-10 ENCOUNTER — Ambulatory Visit: Payer: Self-pay

## 2024-07-10 NOTE — Telephone Encounter (Signed)
 FYI Only or Action Required?: FYI only for provider.  Patient was last seen in primary care on 03/21/2024 by Vicci Duwaine SQUIBB, DO.  Called Nurse Triage reporting Foot Injury.  Symptoms began Unsure.  Interventions attempted: Other: will not provide.  Symptoms are: will not provide.  Triage Disposition: Call PCP When Office is Open  Patient/caregiver understands and will follow disposition?: No, wishes to speak with PCP     Copied from CRM [id number]. Topic: Clinical - Red Word Triage >> Jul 10, 2024 11:32 AM Kevelyn M wrote: Patient is diabetic and dropped a bar of soap on foot 2 months ago. A week ago patient's dog stepped on the sore area of foot and scratched it. It is now red. Reason for Disposition  [1] Caller mainly has complaints about past medical care, billing, etc. AND [2] has mild symptoms or is well  Answer Assessment - Initial Assessment Questions 1. SITUATION:  Document reason for call.     Patient called to schedule an appt and disconnected when transferred to NT line. This RN called patient back to assess symptoms and possibly schedu e 2. BACKGROUND: Document any background information (e.g., prior calls, known psychiatric history)     Patient called at 17/21 at 11:30a, This RN returned call at 11:36a  3. ASSESSMENT: Document your nursing assessment.     Patient initaly asked if he would be charged for the NT call. Pt says the last time he called, the person requested his address, insurance info, and SSN. Pt expressed his frustration with the process of scheduling appt.   4. RESPONSE: Document what your response or recommendation was.     This RN advised patient that he does not have to speak with me and that I would send a message for his PCP office to call him. Patient says he that he will just go up to the office and not call anymore because this is ridiculous  Protocols used: Difficult Call-A-AH

## 2024-07-11 NOTE — Telephone Encounter (Addendum)
 Ok for E2C2 to review.  Please transfer to clinic for any CMA to assist. I was unable to leave message as voicemail is not set up.

## 2024-07-12 ENCOUNTER — Other Ambulatory Visit: Payer: Self-pay

## 2024-07-12 NOTE — Progress Notes (Signed)
   07/12/2024  Patient ID: Lucas Weiss, male   DOB: 1956/03/23, 68 y.o.   MRN: [id number]  Subjective/objective Telephone visit to follow-up on management of diabetes.     Diabetes management plan -Current medications:  glipizide  5mg  BID, Ozempic  0.5mg  weekly -Patient endorses tolerating Ozempic  well with improvement in GI upset since stopping metformin  -States he is feeling better overall recently -Did not tolerate SGLT2 therapy in the past -Patient has resumed monitoring home BG and states FBG is averaging 100 -Does not endorse s/sx of hypogycemia -Last A1c was 8.2% on 4/1, down  from 8.6% in December   Assessment/plan   Diabetes management plan -Continue current medication regimen at this time -Begin monitoring FBG daily and recording values -Patient sees PCP again 8/15 and is due for A1c -If A1c >7% and patient continues to tolerate Ozempic  0.5mg  weekly, I recommend increasing Ozempic  to 1mg  weekly and adjusting glipizide  if needed.  I can assist with order change form for Novo PAP; and patient can either continue 0.5mg  weekly until 1mg  pens arrive, or begin using 2 injections of 0.5mg  weekly   Follow up: 3 months   Lucas Weiss, PharmD, DPLA

## 2024-07-27 ENCOUNTER — Other Ambulatory Visit: Payer: Self-pay

## 2024-07-27 NOTE — Patient Instructions (Signed)
Thank you for allowing the Chronic Care Management team to participate in your care.  

## 2024-07-27 NOTE — Patient Outreach (Unsigned)
 Complex Care Management   Visit Note  07/27/2024  Name:  Lucas Weiss MRN: [id number] DOB: Jan 08, 1956  Situation: Referral received for Complex Care Management related to {Criteria:32550} I obtained verbal consent from {CHL AMB Patient/Caregiver:28184}.  Visit completed with ***  {VISIT LOCATION:32553}  Background:   Past Medical History:  Diagnosis Date   Alcohol abuse, in remission    Avascular necrosis of bone of left hip (HCC) 10/15   Total hip replacement recommended by ortho, vascular says it's OK   Avascular necrosis of bone of left hip (HCC)    Total hip replacement recommended by ortho, vascular says he can have it done.     Bleeding hemorrhoids    Broken ribs 09/27/2020   CHF (congestive heart failure) (HCC)    Chronic low back pain    Complication of anesthesia    woke up twice during procedures @ [place] (09/15/2012)   Coronary artery disease    Diabetes mellitus without complication (HCC) 01/09/16   A1c 6.8   Diverticulosis of sigmoid colon    DVT (deep venous thrombosis) (HCC)    Dyslexia    H/O hiatal hernia    Head injury, acute, with loss of consciousness (HCC)    as a child   High cholesterol    Hypertension    Impaired fasting glucose    Last A1c 6.1   Myocardial infarction [facility])    Neuropathy    PAD (peripheral artery disease) (HCC)    07/2012:  right ABI 0.64, left ABI 0.57.   Pneumonia    history of    Primary osteoarthritis of left hip    Renal artery stenosis (HCC)    Bilateral    Severe recurrent major depression without psychotic features (HCC)    Shortness of breath dyspnea    with exertion - due to pain   Stroke (HCC) 2009   after CABG; ~ blind left eye since (09/15/2012)- memory loss    Tobacco abuse    Quit in 2009    Assessment: Patient Reported Symptoms:  Cognitive Cognitive Status: Alert and oriented to person, place, and time, Normal speech and language skills Cognitive/Intellectual Conditions Management [RPT]: None reported  or documented in medical history or problem list   Health Maintenance Behaviors: Annual physical exam, Stress management Healing Pattern: Average Health Facilitated by: Stress management  Neurological Neurological Review of Symptoms: No symptoms reported (History of Stroke) Neurological Management Strategies: Routine screening Neurological Self-Management Outcome: 4 (good)  HEENT HEENT Symptoms Reported: No symptoms reported HEENT Management Strategies: Routine screening HEENT Self-Management Outcome: 4 (good)    Cardiovascular      Respiratory      Endocrine      Gastrointestinal        Genitourinary      Integumentary      Musculoskeletal          Psychosocial              04/21/2024   12:45 PM  Depression screen PHQ 2/9  Decreased Interest 0  Down, Depressed, Hopeless 0  PHQ - 2 Score 0    There were no vitals filed for this visit.  Medications Reviewed Today     Reviewed by Karoline Lima, RN (Registered Nurse) on 07/27/24 at 1011  Med List Status: <None>   Medication Order Taking? Sig Documenting Provider Last Dose Status Informant  aspirin  EC 81 MG tablet [id number]  Take 81 mg by mouth daily. [provider]  Active  Med Note ZENA, CHERYL A   Fri May 07, 2023 11:04 AM) Taking every once in a while  Blood Glucose Monitoring Suppl (ONETOUCH VERIO) w/Device KIT [id number]  To check blood sugar twice a day and document, bring to provider visits. Cannady, Jolene T, NP  Active   glipiZIDE  (GLUCOTROL ) 5 MG tablet [id number]  TAKE 1 TABLET BY MOUTH 2 TIMES A DAY BEFORE A MEAL Cannady, Jolene T, NP  Active   glucose blood (ONETOUCH VERIO) test strip [id number]  To check blood sugar twice a day-before breakfast and 2 hours after a meal- and document, bring to provider visits. Johnson, Megan P, DO  Active   Lancets (ONETOUCH ULTRASOFT) lancets [id number]  To check blood sugar twice a day and document, bring to provider visits. Cannady, Jolene T, NP   Active   lisinopril  (ZESTRIL ) 40 MG tablet [id number]  TAKE 1 TABLET BY MOUTH DAILY Cannady, Jolene T, NP  Active   lovastatin  (MEVACOR ) 40 MG tablet [id number]  TAKE 1 TABLET BY MOUTH AT BEDTIME Johnson, Megan P, DO  Active   metoprolol  tartrate (LOPRESSOR ) 50 MG tablet [id number]  TAKE 1 TABLET BY MOUTH DAILY Johnson, Megan P, DO  Active   Semaglutide ,0.25 or 0.5MG /DOS, (OZEMPIC , 0.25 OR 0.5 MG/DOSE,) 2 MG/3ML SOPN [id number]  Inject 0.25 mg into the skin once a week.  Patient taking differently: Inject 0.5 mg into the skin once a week.   Vicci Duwaine SQUIBB, DO  Active            Med Note (GENTRY, CHERYL A   Fri Dec 31, 2023  1:13 PM) PAP            Recommendation:   {RECOMMENDATONS:32554}  Follow Up Plan:   {FOLLOWUP:32559}  SIG ***

## 2024-08-04 ENCOUNTER — Encounter: Payer: Self-pay | Admitting: Family Medicine

## 2024-08-04 ENCOUNTER — Ambulatory Visit: Admitting: Family Medicine

## 2024-08-04 VITALS — BP 128/78 | HR 74 | Temp 98.0°F | Ht 69.0 in | Wt 176.0 lb

## 2024-08-04 DIAGNOSIS — E785 Hyperlipidemia, unspecified: Secondary | ICD-10-CM | POA: Diagnosis not present

## 2024-08-04 DIAGNOSIS — Z23 Encounter for immunization: Secondary | ICD-10-CM

## 2024-08-04 DIAGNOSIS — E1169 Type 2 diabetes mellitus with other specified complication: Secondary | ICD-10-CM | POA: Diagnosis not present

## 2024-08-04 DIAGNOSIS — E1159 Type 2 diabetes mellitus with other circulatory complications: Secondary | ICD-10-CM | POA: Diagnosis not present

## 2024-08-04 DIAGNOSIS — I152 Hypertension secondary to endocrine disorders: Secondary | ICD-10-CM

## 2024-08-04 DIAGNOSIS — E782 Mixed hyperlipidemia: Secondary | ICD-10-CM

## 2024-08-04 DIAGNOSIS — I110 Hypertensive heart disease with heart failure: Secondary | ICD-10-CM

## 2024-08-04 DIAGNOSIS — E1165 Type 2 diabetes mellitus with hyperglycemia: Secondary | ICD-10-CM

## 2024-08-04 DIAGNOSIS — W548XXA Other contact with dog, initial encounter: Secondary | ICD-10-CM

## 2024-08-04 DIAGNOSIS — M79672 Pain in left foot: Secondary | ICD-10-CM

## 2024-08-04 LAB — MICROALBUMIN, URINE WAIVED
Creatinine, Urine Waived: 50 mg/dL (ref 10–300)
Microalb, Ur Waived: 150 mg/L — ABNORMAL HIGH (ref 0–19)
Microalb/Creat Ratio: >300 mg/g — ABNORMAL HIGH (ref <30)

## 2024-08-04 LAB — BAYER DCA HB A1C WAIVED: HB A1C (BAYER DCA - WAIVED): 6.8 % — ABNORMAL HIGH (ref 4.8–5.6)

## 2024-08-04 MED ORDER — LISINOPRIL 40 MG PO TABS: 40.0000 mg | ORAL_TABLET | Freq: Every day | ORAL | 1 refills | Status: DC

## 2024-08-04 MED ORDER — LOVASTATIN 40 MG PO TABS: 40.0000 mg | ORAL_TABLET | Freq: Every day | ORAL | 1 refills | Status: DC

## 2024-08-04 MED ORDER — GLIPIZIDE 5 MG PO TABS: 5.0000 mg | ORAL_TABLET | Freq: Two times a day (BID) | ORAL | 1 refills | Status: DC

## 2024-08-04 MED ORDER — METOPROLOL TARTRATE 50 MG PO TABS: 50.0000 mg | ORAL_TABLET | Freq: Every day | ORAL | 1 refills | Status: DC

## 2024-08-04 MED ORDER — OZEMPIC (0.25 OR 0.5 MG/DOSE) 2 MG/3ML ~~LOC~~ SOPN: 0.5000 mg | PEN_INJECTOR | SUBCUTANEOUS | 1 refills | Status: DC

## 2024-08-04 NOTE — Progress Notes (Signed)
 BP 128/78   Pulse 74   Temp 98 F (36.7 C) (Oral)   Ht 5' 9 (1.753 m)   Wt 176 lb (79.8 kg)   SpO2 97%   BMI 25.99 kg/m    Subjective:    Patient ID: Lucas Weiss, male    DOB: 1956/07/13, 68 y.o.   MRN: [id number]  HPI: Lucas Weiss is a 68 y.o. male  Chief Complaint  Patient presents with   Hypotension   Hypertension   Diabetes   Foot Pain    Left. Dog jumped on foot and scratched it.    DIABETES Hypoglycemic episodes:no Polydipsia/polyuria: no Visual disturbance: no Chest pain: no Paresthesias: yes Glucose Monitoring: no Taking Insulin?: no Blood Pressure Monitoring: not checking Retinal Examination: Not up to Date Foot Exam: Up to Date Diabetic Education: Completed Pneumovax: Up to Date Influenza: Not up to Date Aspirin : yes  HYPERTENSION / HYPERLIPIDEMIA Satisfied with current treatment? yes Duration of hypertension: chronic BP monitoring frequency: not checking BP medication side effects: no Past BP meds: metoprolol . lisinopril  Duration of hyperlipidemia: chronic Cholesterol medication side effects: no Cholesterol supplements: none Past cholesterol medications: lovastatin  Medication compliance: excellent compliance Aspirin : yes Recent stressors: no Recurrent headaches: no Visual changes: no Palpitations: no Dyspnea: no Chest pain: no Lower extremity edema: no Dizzy/lightheaded: no  Had a dog jump on his foot, but also fell onto it in the bathroom. This happened several days ago. His foot has been hurting worse and not getting better.   Relevant past medical, surgical, family and social history reviewed and updated as indicated. Interim medical history since our last visit reviewed. Allergies and medications reviewed and updated.  Review of Systems  Constitutional: Negative.   Respiratory: Negative.    Cardiovascular: Negative.   Musculoskeletal:  Positive for arthralgias and myalgias. Negative for back pain, gait problem, joint  swelling, neck pain and neck stiffness.  Neurological: Negative.   Psychiatric/Behavioral: Negative.      Per HPI unless specifically indicated above     Objective:    BP 128/78   Pulse 74   Temp 98 F (36.7 C) (Oral)   Ht 5' 9 (1.753 m)   Wt 176 lb (79.8 kg)   SpO2 97%   BMI 25.99 kg/m   Wt Readings from Last 3 Encounters:  08/04/24 176 lb (79.8 kg)  03/21/24 174 lb 6.4 oz (79.1 kg)  01/18/24 184 lb (83.5 kg)    Physical Exam Vitals and nursing note reviewed.  Constitutional:      General: He is not in acute distress.    Appearance: Normal appearance. He is not ill-appearing, toxic-appearing or diaphoretic.  HENT:     Head: Normocephalic and atraumatic.     Right Ear: External ear normal.     Left Ear: External ear normal.     Nose: Nose normal.     Mouth/Throat:     Mouth: Mucous membranes are moist.     Pharynx: Oropharynx is clear.  Eyes:     General: No scleral icterus.       Right eye: No discharge.        Left eye: No discharge.     Extraocular Movements: Extraocular movements intact.     Conjunctiva/sclera: Conjunctivae normal.     Pupils: Pupils are equal, round, and reactive to light.  Cardiovascular:     Rate and Rhythm: Normal rate and regular rhythm.     Pulses: Normal pulses.     Heart sounds: Normal heart  sounds. No murmur heard.    No friction rub. No gallop.  Pulmonary:     Effort: Pulmonary effort is normal. No respiratory distress.     Breath sounds: Normal breath sounds. No stridor. No wheezing, rhonchi or rales.  Chest:     Chest wall: No tenderness.  Musculoskeletal:        General: Normal range of motion.     Cervical back: Normal range of motion and neck supple.  Skin:    General: Skin is warm and dry.     Capillary Refill: Capillary refill takes less than 2 seconds.     Coloration: Skin is not jaundiced or pale.     Findings: No bruising, erythema, lesion or rash.  Neurological:     General: No focal deficit present.     Mental  Status: He is alert and oriented to person, place, and time. Mental status is at baseline.  Psychiatric:        Mood and Affect: Mood normal.        Behavior: Behavior normal.        Thought Content: Thought content normal.        Judgment: Judgment normal.     Results for orders placed or performed in visit on 08/04/24  Bayer DCA Hb A1c Waived   Collection Time: 08/04/24  1:39 PM  Result Value Ref Range   HB A1C (BAYER DCA - WAIVED) 6.8 (H) 4.8 - 5.6 %  CBC with Differential/Platelet   Collection Time: 08/04/24  1:40 PM  Result Value Ref Range   WBC 7.7 3.4 - 10.8 x10E3/uL   RBC 4.44 4.14 - 5.80 x10E6/uL   Hemoglobin 12.5 (L) 13.0 - 17.7 g/dL   Hematocrit 61.1 62.4 - 51.0 %   MCV 87 79 - 97 fL   MCH 28.2 26.6 - 33.0 pg   MCHC 32.2 31.5 - 35.7 g/dL   RDW 85.8 88.3 - 84.5 %   Platelets 197 150 - 450 x10E3/uL   Neutrophils 64 Not Estab. %   Lymphs 25 Not Estab. %   Monocytes 7 Not Estab. %   Eos 3 Not Estab. %   Basos 1 Not Estab. %   Neutrophils Absolute 4.9 1.4 - 7.0 x10E3/uL   Lymphocytes Absolute 1.9 0.7 - 3.1 x10E3/uL   Monocytes Absolute 0.5 0.1 - 0.9 x10E3/uL   EOS (ABSOLUTE) 0.2 0.0 - 0.4 x10E3/uL   Basophils Absolute 0.1 0.0 - 0.2 x10E3/uL   Immature Granulocytes 0 Not Estab. %   Immature Grans (Abs) 0.0 0.0 - 0.1 x10E3/uL  Comprehensive metabolic panel with GFR   Collection Time: 08/04/24  1:40 PM  Result Value Ref Range   Glucose 149 (H) 70 - 99 mg/dL   BUN 24 8 - 27 mg/dL   Creatinine, Ser 8.30 (H) 0.76 - 1.27 mg/dL   eGFR 44 (L) >40 fO/fpw/8.26   BUN/Creatinine Ratio 14 10 - 24   Sodium 139 134 - 144 mmol/L   Potassium 4.7 3.5 - 5.2 mmol/L   Chloride 105 96 - 106 mmol/L   CO2 18 (L) 20 - 29 mmol/L   Calcium  10.5 (H) 8.6 - 10.2 mg/dL   Total Protein 7.0 6.0 - 8.5 g/dL   Albumin 4.2 3.9 - 4.9 g/dL   Globulin, Total 2.8 1.5 - 4.5 g/dL   Bilirubin Total 0.3 0.0 - 1.2 mg/dL   Alkaline Phosphatase 117 44 - 121 IU/L   AST 22 0 - 40 IU/L   ALT 29 0 -  44  IU/L  Lipid Panel w/o Chol/HDL Ratio   Collection Time: 08/04/24  1:40 PM  Result Value Ref Range   Cholesterol, Total 186 100 - 199 mg/dL   Triglycerides 646 (H) 0 - 149 mg/dL   HDL 33 (L) >60 mg/dL   VLDL Cholesterol Cal 59 (H) 5 - 40 mg/dL   LDL Chol Calc (NIH) 94 0 - 99 mg/dL  Microalbumin, Urine Waived   Collection Time: 08/04/24  2:13 PM  Result Value Ref Range   Microalb, Ur Waived 150 (H) 0 - 19 mg/L   Creatinine, Urine Waived 50 10 - 300 mg/dL   Microalb/Creat Ratio >300 (H) <30 mg/g      Assessment & Plan:   Problem List Items Addressed This Visit       Cardiovascular and Mediastinum   Hypertension associated with diabetes (HCC)   Under good control on current regimen. Continue current regimen. Continue to monitor. Call with any concerns. Refills given. Labs drawn today.       Relevant Medications   glipiZIDE  (GLUCOTROL ) 5 MG tablet   lisinopril  (ZESTRIL ) 40 MG tablet   lovastatin  (MEVACOR ) 40 MG tablet   metoprolol  tartrate (LOPRESSOR ) 50 MG tablet   Semaglutide ,0.25 or 0.5MG /DOS, (OZEMPIC , 0.25 OR 0.5 MG/DOSE,) 2 MG/3ML SOPN   Other Relevant Orders   CBC with Differential/Platelet (Completed)   Comprehensive metabolic panel with GFR (Completed)     Endocrine   Hyperlipidemia associated with type 2 diabetes mellitus (HCC)   Under good control on current regimen. Continue current regimen. Continue to monitor. Call with any concerns. Refills given. Labs drawn today.        Relevant Medications   glipiZIDE  (GLUCOTROL ) 5 MG tablet   lisinopril  (ZESTRIL ) 40 MG tablet   lovastatin  (MEVACOR ) 40 MG tablet   metoprolol  tartrate (LOPRESSOR ) 50 MG tablet   Semaglutide ,0.25 or 0.5MG /DOS, (OZEMPIC , 0.25 OR 0.5 MG/DOSE,) 2 MG/3ML SOPN   Other Relevant Orders   CBC with Differential/Platelet (Completed)   Comprehensive metabolic panel with GFR (Completed)   Lipid Panel w/o Chol/HDL Ratio (Completed)   Uncontrolled type 2 diabetes mellitus with hyperglycemia, without  long-term current use of insulin (HCC) - Primary   Doing great with A1c of 6.8 down from 8.2. Continue current regimen. Continue to monitor. Call with any concerns.       Relevant Medications   glipiZIDE  (GLUCOTROL ) 5 MG tablet   lisinopril  (ZESTRIL ) 40 MG tablet   lovastatin  (MEVACOR ) 40 MG tablet   Semaglutide ,0.25 or 0.5MG /DOS, (OZEMPIC , 0.25 OR 0.5 MG/DOSE,) 2 MG/3ML SOPN   Other Relevant Orders   Bayer DCA Hb A1c Waived (Completed)   CBC with Differential/Platelet (Completed)   Comprehensive metabolic panel with GFR (Completed)   Microalbumin, Urine Waived (Completed)   Other Visit Diagnoses       Dog scratch       Td given today.   Relevant Orders   Td : Tetanus/diphtheria >7yo Preservative  free (Completed)     Left foot pain       Referral back to podiatry. Call with any concerns. Await their input.        Follow up plan: Return in about 3 months (around 11/04/2024) for physical.

## 2024-08-05 LAB — CBC WITH DIFFERENTIAL/PLATELET
Basophils Absolute: 0.1 x10E3/uL (ref 0.0–0.2)
Basos: 1 %
EOS (ABSOLUTE): 0.2 x10E3/uL (ref 0.0–0.4)
Eos: 3 %
Hematocrit: 38.8 % (ref 37.5–51.0)
Hemoglobin: 12.5 g/dL — ABNORMAL LOW (ref 13.0–17.7)
Immature Grans (Abs): 0 x10E3/uL (ref 0.0–0.1)
Immature Granulocytes: 0 %
Lymphocytes Absolute: 1.9 x10E3/uL (ref 0.7–3.1)
Lymphs: 25 %
MCH: 28.2 pg (ref 26.6–33.0)
MCHC: 32.2 g/dL (ref 31.5–35.7)
MCV: 87 fL (ref 79–97)
Monocytes Absolute: 0.5 x10E3/uL (ref 0.1–0.9)
Monocytes: 7 %
Neutrophils Absolute: 4.9 x10E3/uL (ref 1.4–7.0)
Neutrophils: 64 %
Platelets: 197 x10E3/uL (ref 150–450)
RBC: 4.44 x10E6/uL (ref 4.14–5.80)
RDW: 14.1 % (ref 11.6–15.4)
WBC: 7.7 x10E3/uL (ref 3.4–10.8)

## 2024-08-05 LAB — COMPREHENSIVE METABOLIC PANEL WITH GFR
ALT: 29 IU/L (ref 0–44)
AST: 22 IU/L (ref 0–40)
Albumin: 4.2 g/dL (ref 3.9–4.9)
Alkaline Phosphatase: 117 IU/L (ref 44–121)
BUN/Creatinine Ratio: 14 (ref 10–24)
BUN: 24 mg/dL (ref 8–27)
Bilirubin Total: 0.3 mg/dL (ref 0.0–1.2)
CO2: 18 mmol/L — ABNORMAL LOW (ref 20–29)
Calcium: 10.5 mg/dL — ABNORMAL HIGH (ref 8.6–10.2)
Chloride: 105 mmol/L (ref 96–106)
Creatinine, Ser: 1.69 mg/dL — ABNORMAL HIGH (ref 0.76–1.27)
Globulin, Total: 2.8 g/dL (ref 1.5–4.5)
Glucose: 149 mg/dL — ABNORMAL HIGH (ref 70–99)
Potassium: 4.7 mmol/L (ref 3.5–5.2)
Sodium: 139 mmol/L (ref 134–144)
Total Protein: 7 g/dL (ref 6.0–8.5)
eGFR: 44 mL/min/1.73 — ABNORMAL LOW (ref >59)

## 2024-08-05 LAB — LIPID PANEL W/O CHOL/HDL RATIO
Cholesterol, Total: 186 mg/dL (ref 100–199)
HDL: 33 mg/dL — ABNORMAL LOW (ref >39)
LDL Chol Calc (NIH): 94 mg/dL (ref 0–99)
Triglycerides: 353 mg/dL — ABNORMAL HIGH (ref 0–149)
VLDL Cholesterol Cal: 59 mg/dL — ABNORMAL HIGH (ref 5–40)

## 2024-08-06 ENCOUNTER — Ambulatory Visit: Payer: Self-pay | Admitting: Family Medicine

## 2024-08-06 ENCOUNTER — Encounter: Payer: Self-pay | Admitting: Family Medicine

## 2024-08-06 NOTE — Assessment & Plan Note (Signed)
 Under good control on current regimen. Continue current regimen. Continue to monitor. Call with any concerns. Refills given. Labs drawn today.

## 2024-08-06 NOTE — Assessment & Plan Note (Signed)
 Doing great with A1c of 6.8 down from 8.2. Continue current regimen. Continue to monitor. Call with any concerns.

## 2024-08-10 ENCOUNTER — Telehealth: Payer: Self-pay

## 2024-08-10 NOTE — Telephone Encounter (Signed)
 Called patient to inform that their shipment through PAP (Patient Assistance Program) has been received in office and is available for pickup Monday-Friday 8am - 5pm (lunch 12:15pm-12:45pm office closed).  Patient aware to receive they will need to sign and date the pick up paperwork at the front desk folder.   Company: [employer] Medication received: Ozempic  How many units: 4 boxes

## 2024-08-28 ENCOUNTER — Other Ambulatory Visit: Payer: Self-pay

## 2024-08-28 ENCOUNTER — Ambulatory Visit: Payer: Self-pay

## 2024-08-28 NOTE — Patient Outreach (Unsigned)
 Complex Care Management   Visit Note  08/28/2024  Name:  Lucas Weiss MRN: [id number] DOB: 11/30/1956  Situation: Referral received for Complex Care Management related to Hypertension and Diabetes. I obtained verbal consent from Patient.  Visit completed with Lucas Weiss via telephone.  Background:   Past Medical History:  Diagnosis Date   Alcohol abuse, in remission    Avascular necrosis of bone of left hip (HCC) 10/15   Total hip replacement recommended by ortho, vascular says it's OK   Avascular necrosis of bone of left hip (HCC)    Total hip replacement recommended by ortho, vascular says he can have it done.     Bleeding hemorrhoids    Broken ribs 09/27/2020   CHF (congestive heart failure) (HCC)    Chronic low back pain    Complication of anesthesia    woke up twice during procedures @ [place] (09/15/2012)   Coronary artery disease    Diabetes mellitus without complication (HCC) 01/09/16   A1c 6.8   Diverticulosis of sigmoid colon    DVT (deep venous thrombosis) (HCC)    Dyslexia    H/O hiatal hernia    Head injury, acute, with loss of consciousness (HCC)    as a child   High cholesterol    Hypertension    Impaired fasting glucose    Last A1c 6.1   Myocardial infarction [facility])    Neuropathy    PAD (peripheral artery disease) (HCC)    07/2012:  right ABI 0.64, left ABI 0.57.   Pneumonia    history of    Primary osteoarthritis of left hip    Renal artery stenosis (HCC)    Bilateral    Severe recurrent major depression without psychotic features (HCC)    Shortness of breath dyspnea    with exertion - due to pain   Stroke (HCC) 2009   after CABG; ~ blind left eye since (09/15/2012)- memory loss    Tobacco abuse    Quit in 2009      Assessment: Patient Reported Symptoms: Cognitive Cognitive Status: Alert and oriented to person, place, and time, Normal speech and language skills Cognitive/Intellectual Conditions Management [RPT]: None reported or documented in  medical history or problem list Health Maintenance Behaviors: Annual physical exam, Stress management Healing Pattern: Average Health Facilitated by: Stress management, Rest  Neurological Neurological Review of Symptoms: No symptoms reported, Other: [place] Neurological Symptoms/Conditions [RPT]: History of Stroke Neurological Management Strategies: Routine screening Neurological Self-Management Outcome: 4 (good)  HEENT HEENT Symptoms Reported: No symptoms reported HEENT Management Strategies: Routine screening HEENT Self-Management Outcome: 4 (good)  Cardiovascular Cardiovascular Symptoms Reported: No symptoms reported Does patient have uncontrolled Hypertension?: No  Respiratory Respiratory Symptoms Reported: No symptoms reported Respiratory Self-Management Outcome: 4 (good)  Endocrine Endocrine Symptoms Reported: No symptoms reported Is patient diabetic?: Yes Is patient checking blood sugars at home?: No (Reports not checking consistently but notes fasting readings have been within range)  Gastrointestinal Gastrointestinal Symptoms Reported: No symptoms reported Gastrointestinal Self-Management Outcome: 4 (good)  Genitourinary Genitourinary Symptoms Reported: No symptoms reported Genitourinary Self-Management Outcome: 4 (good)  Integumentary Integumentary Symptoms Reported: No symptoms reported Skin Self-Management Outcome: 4 (good)  Musculoskeletal Musculoskelatal Symptoms Reviewed: No symptoms reported Musculoskeletal Self-Management Outcome: 4 (good)  Psychosocial Psychosocial Symptoms Reported: No symptoms reported Quality of Family Relationships: supportive Do you feel physically threatened by others?: No     There were no vitals filed for this visit.  Medications Reviewed Today     Reviewed by  Karoline Lima, RN (Registered Nurse) on 08/28/24 at 1306  Med List Status: <None>   Medication Order Taking? Sig Documenting Provider Last Dose Status Informant  aspirin  EC 81 MG  tablet [id number]  Take 81 mg by mouth daily. [provider]  Active            Med Note ZENA, CHERYL A   Fri May 07, 2023 11:04 AM) Taking every once in a while  Blood Glucose Monitoring Suppl (ONETOUCH VERIO) w/Device KIT [id number]  To check blood sugar twice a day and document, bring to provider visits. Cannady, Jolene T, NP  Active   glipiZIDE  (GLUCOTROL ) 5 MG tablet [id number]  Take 1 tablet (5 mg total) by mouth 2 (two) times daily before a meal. Vicci, Megan P, DO  Active   glucose blood (ONETOUCH VERIO) test strip [id number]  To check blood sugar twice a day-before breakfast and 2 hours after a meal- and document, bring to provider visits. Johnson, Megan P, DO  Active   Lancets (ONETOUCH ULTRASOFT) lancets [id number]  To check blood sugar twice a day and document, bring to provider visits. Cannady, Jolene T, NP  Active   lisinopril  (ZESTRIL ) 40 MG tablet [id number]  Take 1 tablet (40 mg total) by mouth daily. Johnson, Megan P, DO  Active   lovastatin  (MEVACOR ) 40 MG tablet [id number]  Take 1 tablet (40 mg total) by mouth at bedtime. Vicci, Megan P, DO  Active   metoprolol  tartrate (LOPRESSOR ) 50 MG tablet [id number]  Take 1 tablet (50 mg total) by mouth daily. Johnson, Megan P, DO  Active   Semaglutide ,0.25 or 0.5MG /DOS, (OZEMPIC , 0.25 OR 0.5 MG/DOSE,) 2 MG/3ML SOPN [id number]  Inject 0.5 mg into the skin once a week. Vicci Duwaine SQUIBB, DO  Active             Recommendation:   Continue Current Plan of Care  Follow Up Plan:   Telephone follow up appointment with Nurse Case Manager on September 20, 2024.   Lima Karoline [facility] Population Health RN Care Manager Direct Dial: [phone]  Fax: [phone] Website: [email or web address]

## 2024-08-29 NOTE — Patient Instructions (Signed)
 Thank you for allowing the Complex Care Management team to participate in your care. It was great speaking with you!  We will follow up on September 20, 2024 at 3 pm. Please do not hesitate to contact me if you require assistance prior to our next outreach.   Jackson Acron [facility] Population Health RN Care Manager Direct Dial: [phone]  Fax: [phone] Website: [email or web address]

## 2024-09-14 ENCOUNTER — Ambulatory Visit: Admitting: Emergency Medicine

## 2024-09-14 VITALS — Ht 70.0 in | Wt 176.0 lb

## 2024-09-14 DIAGNOSIS — Z0000 Encounter for general adult medical examination without abnormal findings: Secondary | ICD-10-CM

## 2024-09-14 NOTE — Progress Notes (Signed)
 Subjective:   Lucas Weiss is a 68 y.o. who presents for a Medicare Wellness preventive visit.  As a reminder, Annual Wellness Visits don't include a physical exam, and some assessments may be limited, especially if this visit is performed virtually. We may recommend an in-person follow-up visit with your provider if needed.  Visit Complete: Virtual I connected with  Lucas Weiss on 09/14/24 by a audio enabled telemedicine application and verified that I am speaking with the correct person using two identifiers.  Patient Location: Home  Provider Location: Home Office  I discussed the limitations of evaluation and management by telemedicine. The patient expressed understanding and agreed to proceed.  Vital Signs: Because this visit was a virtual/telehealth visit, some criteria may be missing or patient reported. Any vitals not documented were not able to be obtained and vitals that have been documented are patient reported.  VideoDeclined- This patient declined Librarian, academic. Therefore the visit was completed with audio only.  Persons Participating in Visit: Patient.  AWV Questionnaire: No: Patient Medicare AWV questionnaire was not completed prior to this visit.  Cardiac Risk Factors include: advanced age (>49men, >47 women);male gender;diabetes mellitus;hypertension;dyslipidemia;Other (see comment), Risk factor comments: CAD     Objective:    Today's Vitals   09/14/24 1345 09/14/24 1346  Weight: 176 lb (79.8 kg)   Height: 5' 10 (1.778 m)   PainSc:  4    Body mass index is 25.25 kg/m.     09/14/2024    2:03 PM 04/21/2024   12:30 PM 08/30/2020    3:29 PM 07/31/2019    1:13 PM 07/25/2018    1:10 PM 07/21/2017    3:31 PM 05/12/2016   11:32 AM  Advanced Directives  Does Patient Have a Medical Advance Directive? No No No No No  No  No   Would patient like information on creating a medical advance directive? Yes (MAU/Ambulatory/Procedural Areas -  Information given) No - Patient declined   Yes (MAU/Ambulatory/Procedural Areas - Information given)  Yes (MAU/Ambulatory/Procedural Areas - Information given)  Yes - Educational materials given      Data saved with a previous flowsheet row definition    Current Medications (verified) Outpatient Encounter Medications as of 09/14/2024  Medication Sig   aspirin  EC 81 MG tablet Take 81 mg by mouth daily.   Blood Glucose Monitoring Suppl (ONETOUCH VERIO) w/Device KIT To check blood sugar twice a day and document, bring to provider visits.   glipiZIDE  (GLUCOTROL ) 5 MG tablet Take 1 tablet (5 mg total) by mouth 2 (two) times daily before a meal.   glucose blood (ONETOUCH VERIO) test strip To check blood sugar twice a day-before breakfast and 2 hours after a meal- and document, bring to provider visits.   Lancets (ONETOUCH ULTRASOFT) lancets To check blood sugar twice a day and document, bring to provider visits.   lisinopril  (ZESTRIL ) 40 MG tablet Take 1 tablet (40 mg total) by mouth daily.   lovastatin  (MEVACOR ) 40 MG tablet Take 1 tablet (40 mg total) by mouth at bedtime.   metoprolol  tartrate (LOPRESSOR ) 50 MG tablet Take 1 tablet (50 mg total) by mouth daily.   Semaglutide ,0.25 or 0.5MG /DOS, (OZEMPIC , 0.25 OR 0.5 MG/DOSE,) 2 MG/3ML SOPN Inject 0.5 mg into the skin once a week.   No facility-administered encounter medications on file as of 09/14/2024.    Allergies (verified) Duloxetine    History: Past Medical History:  Diagnosis Date   Alcohol abuse, in remission  Avascular necrosis of bone of left hip (HCC) 10/15   Total hip replacement recommended by ortho, vascular says it's OK   Avascular necrosis of bone of left hip (HCC)    Total hip replacement recommended by ortho, vascular says he can have it done.     Bleeding hemorrhoids    Broken ribs 09/27/2020   CHF (congestive heart failure) (HCC)    Chronic low back pain    Complication of anesthesia    woke up twice during  procedures @ [place] (09/15/2012)   Coronary artery disease    Diabetes mellitus without complication (HCC) 01/09/16   A1c 6.8   Diverticulosis of sigmoid colon    DVT (deep venous thrombosis) (HCC)    Dyslexia    H/O hiatal hernia    Head injury, acute, with loss of consciousness (HCC)    as a child   High cholesterol    Hypertension    Impaired fasting glucose    Last A1c 6.1   Myocardial infarction (HCC)    Neuropathy    PAD (peripheral artery disease)    07/2012:  right ABI 0.64, left ABI 0.57.   Pneumonia    history of    Primary osteoarthritis of left hip    Renal artery stenosis    Bilateral    Severe recurrent major depression without psychotic features (HCC)    Shortness of breath dyspnea    with exertion - due to pain   Stroke (HCC) 2009   after CABG; ~ blind left eye since (09/15/2012)- memory loss    Tobacco abuse    Quit in 2009   Past Surgical History:  Procedure Laterality Date   2D Echo  02/17/11   EF 50-55%, mild LVH, No WMA, mild MR, mild TR   CARDIAC CATHETERIZATION  ?2009   CARDIOVASCULAR STRESS TEST  08/10/12   Lexiscan: EF 70%, Attenuation artifact in inferior region.  No ischemia or infarct in remaining myocardium. Low risk   COLONOSCOPY W/ POLYPECTOMY     no cancer   CORONARY ARTERY BYPASS GRAFT  2009   2009, CABG X5, LIMA to LAD, SVG to Diagnonal, Sequential SVG to OM 2-3, SVG to PDA; Dr. Lucas   INSERTION OF ILIAC STENT Left 09/15/2012   Procedure: INSERTION OF ILIAC STENT;  Surgeon: Lucas JINNY Lesches, MD;  [facility] C[facility] LAB;  Service: Cardiovascular;  Laterality: Left;  lt iliac stent   LOWER EXTREMITY ANGIOGRAM N/A 09/15/2012   Procedure: LOWER EXTREMITY ANGIOGRAM;  Surgeon: Lucas JINNY Lesches, MD;  Location: [facility] CATH LAB;  Service: Cardiovascular;  Laterality: N/A;   PERIPHERAL ARTERIAL STENT GRAFT Left 06/20/2012; ~ 07/2012; 09/15/2012   left; left; left   PV angiogram  09/15/12   restenting of left common iliac for ISRS.  Bilateral occluded  SFAs.  Renal artery stenosis: 70% right inferior renal artery, 90% left inferior renal artery stenosis.    TOTAL HIP ARTHROPLASTY Left 05/19/2016   Procedure: LEFT TOTAL HIP ARTHROPLASTY ANTERIOR APPROACH;  Surgeon: Lonni CINDERELLA Poli, MD;  Location: [facility] OR;  Service: Orthopedics;  Laterality: Left;   Family History  Problem Relation Age of Onset   Diabetes Mother    Heart disease Mother    Hyperlipidemia Mother    Hypertension Mother    Heart attack Mother    Thyroid  disease Mother    Hyperlipidemia Father    Hypertension Father    Heart attack Father    Heart disease Father        before  age 38   Alcohol abuse Father    Lung disease Father    Diabetes Sister    Hyperlipidemia Sister    Hypertension Sister    Cancer Sister    Heart disease Sister    Hypertension Sister    Hyperlipidemia Sister    Heart disease Brother        before age 37   Hyperlipidemia Brother    Hypertension Brother    Heart attack Brother    Diabetes Brother    Heart disease Brother    Hyperlipidemia Brother    Hypertension Brother    Stroke Brother    Heart attack Brother    Social History   Socioeconomic History   Marital status: Divorced    Spouse name: Not on file   Number of children: 2   Years of education: Not on file   Highest education level: 11th grade  Occupational History   Occupation: disability  Tobacco Use   Smoking status: Former    Current packs/day: 0.00    Average packs/day: 1.5 packs/day for 30.0 years (45.0 ttl pk-yrs)    Types: Cigarettes    Start date: 07/19/1978    Quit date: 07/19/2008    Years since quitting: 16.1   Smokeless tobacco: Never  Vaping Use   Vaping status: Never Used  Substance and Sexual Activity   Alcohol use: No    Comment: Former Alcoholic, not drinking anymore. 09/15/2012 3-4 beers; shot of crown @ least 2 nights/wk   Drug use: No   Sexual activity: Not Currently  Other Topics Concern   Not on file  Social History Narrative   Not on  file   Social Drivers of Health   Financial Resource Strain: Medium Risk (09/14/2024)   Overall Financial Resource Strain (CARDIA)    Difficulty of Paying Living Expenses: Somewhat hard  Food Insecurity: No Food Insecurity (09/14/2024)   Hunger Vital Sign    Worried About Running Out of Food in the Last Year: Never true    Ran Out of Food in the Last Year: Never true  Transportation Needs: No Transportation Needs (09/14/2024)   PRAPARE - Administrator, Civil Service (Medical): No    Lack of Transportation (Non-Medical): No  Physical Activity: Inactive (09/14/2024)   Exercise Vital Sign    Days of Exercise per Week: 0 days    Minutes of Exercise per Session: 0 min  Stress: No Stress Concern Present (09/14/2024)   [employer] of Occupational Health - Occupational Stress Questionnaire    Feeling of Stress: Only a little  Social Connections: Socially Isolated (09/14/2024)   Social Connection and Isolation Panel    Frequency of Communication with Friends and Family: Three times a week    Frequency of Social Gatherings with Friends and Family: More than three times a week    Attends Religious Services: Never    Database administrator or Organizations: No    Attends Engineer, structural: Never    Marital Status: Divorced    Tobacco Counseling Counseling given: Not Answered    Clinical Intake:  Pre-visit preparation completed: Yes  Pain : 0-10 Pain Score: 4  Pain Type: Chronic pain Pain Location: Back Pain Descriptors / Indicators: Aching     BMI - recorded: 25.25 Nutritional Status: BMI 25 -29 Overweight Nutritional Risks: None Diabetes: Yes CBG done?: No (FBS 110 per patient) Did pt. bring in CBG monitor from home?: No  Lab Results  Component Value Date  HGBA1C 6.8 (H) 08/04/2024   HGBA1C 8.2 (H) 03/21/2024   HGBA1C 8.6 (H) 12/17/2023     How often do you need to have someone help you when you read instructions, pamphlets, or other  written materials from your doctor or pharmacy?: 3 - Sometimes (dyslexia)  Interpreter Needed?: No  Information entered by :: Vina Ned, CMA   Activities of Daily Living     09/14/2024    1:51 PM  In your present state of health, do you have any difficulty performing the following activities:  Hearing? 0  Vision? 0  Difficulty concentrating or making decisions? 0  Walking or climbing stairs? 0  Dressing or bathing? 0  Doing errands, shopping? 0  Preparing Food and eating ? N  Using the Toilet? N  In the past six months, have you accidently leaked urine? N  Do you have problems with loss of bowel control? N  Managing your Medications? N  Managing your Finances? N  Housekeeping or managing your Housekeeping? N    Patient Care Team: Vicci Duwaine SQUIBB, DO as PCP - General (Family Medicine) Karoline Lima, RN as Case Manager Deanna Channing LABOR, [facility] (Pharmacist)  I have updated your Care Teams any recent Medical Services you may have received from other providers in the past year.     Assessment:   This is a routine wellness examination for Lucas Weiss.  Hearing/Vision screen Hearing Screening - Comments:: Denies hearing loss  Vision Screening - Comments:: Needs DM eye exams, included list of eye doctors in AVS   Goals Addressed             This Visit's Progress    Patient Stated       Eat healthier       Depression Screen     09/14/2024    1:59 PM 08/28/2024    4:19 PM 07/27/2024   10:24 AM 04/21/2024   12:45 PM 09/14/2023    4:12 PM 08/05/2023    3:56 PM 07/05/2023   10:52 AM  PHQ 2/9 Scores  PHQ - 2 Score  0 0 0 0  0  Exception Documentation Patient refusal     Patient refusal     Fall Risk     09/14/2024    2:06 PM 04/21/2024   12:45 PM 09/02/2022    1:17 PM 10/22/2020   10:34 AM 08/30/2020    3:30 PM  Fall Risk   Falls in the past year? 0 0 1 1 0  Number falls in past yr: 0 0 0 0   Injury with Fall? 0 0 0 1   Risk for fall due to : No Fall Risks Impaired  vision;Medication side effect;Other (Comment);Orthopedic patient History of fall(s)  Medication side effect  Risk for fall due to: Comment  History of stroke     Follow up Falls evaluation completed Falls prevention discussed   Falls evaluation completed;Education provided;Falls prevention discussed      Data saved with a previous flowsheet row definition    MEDICARE RISK AT HOME:  Medicare Risk at Home Any stairs in or around the home?: Yes If so, are there any without handrails?: No Home free of loose throw rugs in walkways, pet beds, electrical cords, etc?: No Adequate lighting in your home to reduce risk of falls?: Yes Life alert?: No Use of a cane, walker or w/c?: No Grab bars in the bathroom?: No Shower chair or bench in shower?: No Elevated toilet seat or a handicapped toilet?: Yes  TIMED UP AND GO:  Was the test performed?  No  Cognitive Function: 6CIT completed        09/14/2024    2:08 PM 09/14/2023    4:12 PM 09/02/2022    1:19 PM 07/25/2018    1:13 PM 07/21/2017    3:34 PM  6CIT Screen  What Year? 0 points 0 points 0 points 0 points 0 points  What month? 0 points 0 points 0 points 0 points 0 points  What time? 0 points 0 points 0 points 0 points 0 points  Count back from 20 0 points 0 points 0 points 0 points 0 points  Months in reverse 2 points 4 points 4 points 0 points 0 points  Repeat phrase 4 points 6 points 10 points 6 points 2 points  Total Score 6 points 10 points 14 points 6 points 2 points    Immunizations Immunization History  Administered Date(s) Administered   Fluad Trivalent(High Dose 65+) 09/14/2023   Influenza Split 09/16/2012   Influenza,inj,Quad PF,6+ Mos 09/06/2015, 09/14/2016, 09/09/2017, 10/17/2018, 09/04/2019, 10/04/2020, 10/22/2021, 09/08/2022   Influenza-Unspecified 09/12/2014   PNEUMOCOCCAL CONJUGATE-20 05/04/2023   Pneumococcal Polysaccharide-23 05/28/2010, 05/20/2016   Td 12/27/2020, 08/04/2024   Tdap 05/28/2010    Screening  Tests Health Maintenance  Topic Date Due   OPHTHALMOLOGY EXAM  Never done   Fecal DNA (Cologuard)  Never done   Zoster Vaccines- Shingrix (1 of 2) Never done   Influenza Vaccine  07/21/2024   HEMOGLOBIN A1C  02/04/2025   FOOT EXAM  03/21/2025   Diabetic kidney evaluation - eGFR measurement  08/04/2025   Diabetic kidney evaluation - Urine ACR  08/04/2025   Medicare Annual Wellness (AWV)  09/14/2025   DTaP/Tdap/Td (4 - Td or Tdap) 08/04/2034   Pneumococcal Vaccine: 50+ Years  Completed   Hepatitis C Screening  Completed   HPV VACCINES  Aged Out   Meningococcal B Vaccine  Aged Out   COVID-19 Vaccine  Discontinued    Health Maintenance Items Addressed: See Nurse Notes at the end of this note  Additional Screening:  Vision Screening: Recommended annual ophthalmology exams for early detection of glaucoma and other disorders of the eye. Is the patient up to date with their annual eye exam?  No  Who is the provider or what is the name of the office in which the patient attends annual eye exams? Included a  list of eye doctors in AVS  Dental Screening: Recommended annual dental exams for proper oral hygiene  Community Resource Referral / Chronic Care Management: CRR required this visit?  No   CCM required this visit?  No   Plan:    I have personally reviewed and noted the following in the patient's chart:   Medical and social history Use of alcohol, tobacco or illicit drugs  Current medications and supplements including opioid prescriptions. Patient is not currently taking opioid prescriptions. Functional ability and status Nutritional status Physical activity Advanced directives List of other physicians Hospitalizations, surgeries, and ER visits in previous 12 months Vitals Screenings to include cognitive, depression, and falls Referrals and appointments  In addition, I have reviewed and discussed with patient certain preventive protocols, quality metrics, and best  practice recommendations. A written personalized care plan for preventive services as well as general preventive health recommendations were provided to patient.   Vina Ned, CMA   09/14/2024   After Visit Summary: (Mail) Due to this being a telephonic visit, the after visit summary with patients personalized plan was  offered to patient via mail   Notes:  6 CIT Score - 6 FBS this morning per patient was 110 Needs DM eye exam. Included a list of eye doctors in AVS Plans to get flu vaccine Declined covid and shingles vaccines Declined colonoscopy Declined referral to DM & Nutrition education

## 2024-09-14 NOTE — Patient Instructions (Signed)
 Lucas Weiss,  Thank you for taking the time for your Medicare Wellness Visit. I appreciate your continued commitment to your health goals. Please review the care plan we discussed, and feel free to reach out if I can assist you further.  Medicare recommends these wellness visits once per year to help you and your care team stay ahead of potential health issues. These visits are designed to focus on prevention, allowing your provider to concentrate on managing your acute and chronic conditions during your regular appointments.  Please note that Annual Wellness Visits do not include a physical exam. Some assessments may be limited, especially if the visit was conducted virtually. If needed, we may recommend a separate in-person follow-up with your provider.  Ongoing Care Seeing your primary care provider every 3 to 6 months helps us  monitor your health and provide consistent, personalized care.   Referrals If a referral was made during today's visit and you haven't received any updates within two weeks, please contact the referred provider directly to check on the status.  Recommended Screenings:  Get a diabetic eye exam every year. I have included a list of eye doctors in the area. Please call and schedule an exam at your earliest convenience.  Get the flu vaccine at your convenience.    Health Maintenance  Topic Date Due   Eye exam for diabetics  Never done   Cologuard (Stool DNA test)  Never done   Zoster (Shingles) Vaccine (1 of 2) Never done   Flu Shot  07/21/2024   Hemoglobin A1C  02/04/2025   Complete foot exam   03/21/2025   Yearly kidney function blood test for diabetes  08/04/2025   Yearly kidney health urinalysis for diabetes  08/04/2025   Medicare Annual Wellness Visit  09/14/2025   DTaP/Tdap/Td vaccine (4 - Td or Tdap) 08/04/2034   Pneumococcal Vaccine for age over 15  Completed   Hepatitis C Screening  Completed   HPV Vaccine  Aged Out   Meningitis B Vaccine  Aged Out    COVID-19 Vaccine  Discontinued       09/14/2024    2:03 PM  Advanced Directives  Does Patient Have a Medical Advance Directive? No  Would patient like information on creating a medical advance directive? Yes (MAU/Ambulatory/Procedural Areas - Information given)   Advance Care Planning is important because it: Ensures you receive medical care that aligns with your values, goals, and preferences. Provides guidance to your family and loved ones, reducing the emotional burden of decision-making during critical moments.  Vision: Annual vision screenings are recommended for early detection of glaucoma, cataracts, and diabetic retinopathy. These exams can also reveal signs of chronic conditions such as diabetes and high blood pressure.  Dental: Annual dental screenings help detect early signs of oral cancer, gum disease, and other conditions linked to overall health, including heart disease and diabetes.  Please see the attached documents for additional preventive care recommendations.   There are several Eye Doctors in your area. Here are a few that usually accept all insurance types:  [facility] [address] [place], [place] [address] Phone: [phone]  [facility] [address] [place], [place] [address] Phone: [phone]  [facility] [address] [place], [place] [address] Phone: [phone]  [facility] [address] [place], [place] [address] Phone: [phone]  [facility] [address] [place], [place] [address] Phone: [phone]  [facility] [address] [place], [place] [address] Phone: [phone])  [phone]  Please let us  know if you require a referral for an eye exam appointment. Thank you!    Fall Prevention in the Home, Adult Falls can cause injuries and affect people of all ages. There are many simple things that you can do to make your home safe and to help prevent falls. If you need it, ask for help making these changes. What  actions can I take to prevent falls? General information Use good lighting in all rooms. Make sure to: Replace any light bulbs that burn out. Turn on lights if it is dark and use night-lights. Keep items that you use often in easy-to-reach places. Lower the shelves around your home if needed. Move furniture so that there are clear paths around it. Do not keep throw rugs or other things on the floor that can make you trip. If any of your floors are uneven, fix them. Add color or contrast paint or tape to clearly mark and help you see: Grab bars or handrails. First and last steps of staircases. Where the edge of each step is. If you use a ladder or stepladder: Make sure that it is fully opened. Do not climb a closed ladder. Make sure the sides of the ladder are locked in place. Have someone hold the ladder while you use it. Know where your pets are as you move through your home. What can I do in the bathroom?     Keep the floor dry. Clean up any water that is on the floor right away. Remove soap buildup in the bathtub or shower. Buildup makes bathtubs and showers slippery. Use non-skid mats or decals on the floor of the bathtub or shower. Attach bath mats securely with double-sided, non-slip rug tape. If you need to sit down while you are in the shower, use a non-slip stool. Install grab bars by the toilet and in the bathtub and shower. Do not use towel bars as grab bars. What can I do in the bedroom? Make sure that you have a light by your bed that is easy to reach. Do not use any sheets or blankets on your bed that hang to the floor. Have a firm bench or chair with side arms that you can use for support when you get dressed. What can I do in the kitchen? Clean up any spills right away. If you need to reach something above you, use a sturdy step stool that has a grab bar. Keep electrical cables out of the way. Do not use floor polish or wax that makes floors slippery. What can I  do with my stairs? Do not leave anything on the stairs. Make sure that you have a light switch at the top and the bottom of the stairs. Have them installed if you do not have them. Make sure that there are handrails on both sides of the stairs. Fix handrails that are broken or loose. Make sure that handrails are as long as the staircases. Install non-slip stair treads on all stairs in your home if they do not have carpet. Avoid having throw rugs at the top or bottom of stairs, or secure the rugs with carpet tape to prevent them from moving. Choose a carpet design that does not hide the edge of steps on the stairs. Make sure that carpet is firmly attached to the stairs. Fix any carpet that is loose or worn. What can I do on the outside of my home? Use bright outdoor lighting. Repair the edges of  walkways and driveways and fix any cracks. Clear paths of anything that can make you trip, such as tools or rocks. Add color or contrast paint or tape to clearly mark and help you see high doorway thresholds. Trim any bushes or trees on the main path into your home. Check that handrails are securely fastened and in good repair. Both sides of all steps should have handrails. Install guardrails along the edges of any raised decks or porches. Have leaves, snow, and ice cleared regularly. Use sand, salt, or ice melt on walkways during winter months if you live where there is ice and snow. In the garage, clean up any spills right away, including grease or oil spills. What other actions can I take? Review your medicines with your health care provider. Some medicines can make you confused or feel dizzy. This can increase your chance of falling. Wear closed-toe shoes that fit well and support your feet. Wear shoes that have rubber soles and low heels. Use a cane, walker, scooter, or crutches that help you move around if needed. Talk with your provider about other ways that you can decrease your risk of falls. This  may include seeing a physical therapist to learn to do exercises to improve movement and strength. Where to find more information Centers for Disease Control and Prevention, STEADI: TonerPromos.no [employer] on Aging: BaseRingTones.pl National Institute on Aging: BaseRingTones.pl Contact a health care provider if: You are afraid of falling at home. You feel weak, drowsy, or dizzy at home. You fall at home. Get help right away if you: Lose consciousness or have trouble moving after a fall. Have a fall that causes a head injury. These symptoms may be an emergency. Get help right away. Call 911. Do not wait to see if the symptoms will go away. Do not drive yourself to the hospital. This information is not intended to replace advice given to you by your health care provider. Make sure you discuss any questions you have with your health care provider. Document Revised: 08/10/2022 Document Reviewed: 08/10/2022 Elsevier Patient Education  2024 [employer].

## 2024-09-19 ENCOUNTER — Encounter: Payer: Self-pay | Admitting: Family Medicine

## 2024-09-20 ENCOUNTER — Telehealth

## 2024-10-06 ENCOUNTER — Telehealth: Payer: Self-pay

## 2024-10-12 ENCOUNTER — Other Ambulatory Visit: Payer: Self-pay

## 2024-10-12 DIAGNOSIS — E1165 Type 2 diabetes mellitus with hyperglycemia: Secondary | ICD-10-CM

## 2024-10-13 NOTE — Progress Notes (Signed)
   10/12/2024  Patient ID: Lucas Weiss, male   DOB: 08/10/1956, 68 y.o.   MRN: [id number]  Subjective/objective Telephone visit to follow-up on management of diabetes.     Diabetes management plan -Current medications:  glipizide  5mg  BID, Ozempic  0.5mg  weekly -Patient endorses tolerating Ozempic  well with improvement in GI upset since stopping metformin  -States he is feeling better overall recently -Did not tolerate SGLT2 therapy in the past -Patient has resumed monitoring home BG and states FBG is averaging 105 -Does not endorse s/sx of hypogycemia -Last A1c was 6.8%, down from 8.2% previously   Assessment/plan   Diabetes management plan -A1c at goal of <7% -Continue current regimen and regular monitoring of FBG -Informed patient of upcoming changes to Novo PAP, and it is likely that he would qualify for Novo PAP to assist with medication costs   Follow up: Telephone visit next week to complete LIS application   Lucas Weiss, PharmD, DPLA

## 2024-10-18 ENCOUNTER — Other Ambulatory Visit: Payer: Self-pay

## 2024-10-18 NOTE — Progress Notes (Unsigned)
   10/18/2024  Patient ID: Lucas Weiss, male   DOB: 03/06/1956, 69 y.o.   MRN: [id number]  Attempted to call patient x2 for scheduled telephone visit to complete LIS Medicare Extra Help application online.  I was not able to reach the patient, and his voicemail has not been set up for me to be able to leave a message.  I will attempt to call him again next week if I do not hear back.  Channing DELENA Mealing, PharmD, DPLA

## 2024-10-26 ENCOUNTER — Other Ambulatory Visit: Payer: Self-pay

## 2024-10-26 NOTE — Progress Notes (Signed)
   10/26/2024  Patient ID: Lucas Weiss, male   DOB: 1956-11-27, 68 y.o.   MRN: [id number]  LIS Medicare Extra Help application completed online during telephone visit with patient.  Advised him he should receive a letter in the mail from [facility] with determination in the next 4-6 weeks.  He currently has 2 Ozempic  pens left, which will last through the end of the year.  If approved for LIS, this will make his Ozempic  $12 to fill.  Telephone follow-up scheduled in 6 weeks to check on management of diabetes and status of LIS application.  Lucas Weiss, PharmD, DPLA

## 2024-11-14 ENCOUNTER — Ambulatory Visit: Admitting: Family Medicine

## 2024-11-14 ENCOUNTER — Encounter: Payer: Self-pay | Admitting: Family Medicine

## 2024-11-14 VITALS — BP 156/78 | HR 76 | Temp 98.0°F | Ht 69.69 in | Wt 178.6 lb

## 2024-11-14 DIAGNOSIS — I25119 Atherosclerotic heart disease of native coronary artery with unspecified angina pectoris: Secondary | ICD-10-CM

## 2024-11-14 DIAGNOSIS — Z1211 Encounter for screening for malignant neoplasm of colon: Secondary | ICD-10-CM | POA: Diagnosis not present

## 2024-11-14 DIAGNOSIS — I509 Heart failure, unspecified: Secondary | ICD-10-CM | POA: Diagnosis not present

## 2024-11-14 DIAGNOSIS — E1165 Type 2 diabetes mellitus with hyperglycemia: Secondary | ICD-10-CM

## 2024-11-14 DIAGNOSIS — I152 Hypertension secondary to endocrine disorders: Secondary | ICD-10-CM | POA: Diagnosis not present

## 2024-11-14 DIAGNOSIS — E785 Hyperlipidemia, unspecified: Secondary | ICD-10-CM | POA: Diagnosis not present

## 2024-11-14 DIAGNOSIS — F332 Major depressive disorder, recurrent severe without psychotic features: Secondary | ICD-10-CM | POA: Diagnosis not present

## 2024-11-14 DIAGNOSIS — I110 Hypertensive heart disease with heart failure: Secondary | ICD-10-CM | POA: Diagnosis not present

## 2024-11-14 DIAGNOSIS — I739 Peripheral vascular disease, unspecified: Secondary | ICD-10-CM | POA: Diagnosis not present

## 2024-11-14 DIAGNOSIS — Z23 Encounter for immunization: Secondary | ICD-10-CM

## 2024-11-14 DIAGNOSIS — R3911 Hesitancy of micturition: Secondary | ICD-10-CM | POA: Diagnosis not present

## 2024-11-14 DIAGNOSIS — I70219 Atherosclerosis of native arteries of extremities with intermittent claudication, unspecified extremity: Secondary | ICD-10-CM

## 2024-11-14 DIAGNOSIS — E1169 Type 2 diabetes mellitus with other specified complication: Secondary | ICD-10-CM | POA: Diagnosis not present

## 2024-11-14 DIAGNOSIS — F1011 Alcohol abuse, in remission: Secondary | ICD-10-CM | POA: Diagnosis not present

## 2024-11-14 DIAGNOSIS — Z0000 Encounter for general adult medical examination without abnormal findings: Secondary | ICD-10-CM

## 2024-11-14 DIAGNOSIS — E1159 Type 2 diabetes mellitus with other circulatory complications: Secondary | ICD-10-CM | POA: Diagnosis not present

## 2024-11-14 DIAGNOSIS — E1129 Type 2 diabetes mellitus with other diabetic kidney complication: Secondary | ICD-10-CM

## 2024-11-14 LAB — MICROALBUMIN, URINE WAIVED
Creatinine, Urine Waived: 200 mg/dL (ref 10–300)
Microalb, Ur Waived: 150 mg/L — ABNORMAL HIGH (ref 0–19)

## 2024-11-14 LAB — BAYER DCA HB A1C WAIVED: HB A1C (BAYER DCA - WAIVED): 6 % — ABNORMAL HIGH (ref 4.8–5.6)

## 2024-11-14 NOTE — Progress Notes (Signed)
 BP (!) 156/78 (BP Location: Left Arm, Cuff Size: Normal)   Pulse 76   Temp 98 F (36.7 C) (Oral)   Ht 5' 9.69 (1.77 m)   Wt 178 lb 9.6 oz (81 kg)   SpO2 98%   BMI 25.86 kg/m    Subjective:    Patient ID: Lucas Weiss, male    DOB: 12/01/1956, 68 y.o.   MRN: [id number]  HPI: Lucas Weiss is a 68 y.o. male presenting on 11/14/2024 for comprehensive medical examination. Current medical complaints include:  DIABETES Hypoglycemic episodes:no Polydipsia/polyuria: no Visual disturbance: no Chest pain: no Paresthesias: no Glucose Monitoring: no Taking Insulin?: no Blood Pressure Monitoring: not checking Retinal Examination: Not up to Date Foot Exam: Up to Date Diabetic Education: Completed Pneumovax: Up to Date Influenza: Up to Date Aspirin : yes  HYPERTENSION / HYPERLIPIDEMIA Satisfied with current treatment? yes Duration of hypertension: chronic BP monitoring frequency: not checking BP medication side effects: no Past BP meds: metoprolol , lisinopril  Duration of hyperlipidemia: chronic Cholesterol medication side effects: no Cholesterol supplements: none Past cholesterol medications: lovastatin  Medication compliance: excellent compliance Aspirin : yes Recent stressors: no Recurrent headaches: no Visual changes: no Palpitations: no Dyspnea: no Chest pain: no Lower extremity edema: no Dizzy/lightheaded: no  He currently lives with: alone Interim Problems from his last visit: no  Depression Screen done today and results listed below:     11/14/2024    2:27 PM 08/28/2024    4:19 PM 07/27/2024   10:24 AM 04/21/2024   12:45 PM 09/14/2023    4:12 PM  Depression screen PHQ 2/9  Decreased Interest 0 0 0 0 0  Down, Depressed, Hopeless 0 0 0 0 0  PHQ - 2 Score 0 0 0 0 0    Past Medical History:  Past Medical History:  Diagnosis Date   Alcohol abuse, in remission    Avascular necrosis of bone of left hip (HCC) 10/15   Total hip replacement recommended by ortho,  vascular says it's OK   Avascular necrosis of bone of left hip (HCC)    Total hip replacement recommended by ortho, vascular says he can have it done.     Bleeding hemorrhoids    Broken ribs 09/27/2020   CHF (congestive heart failure) (HCC)    Chronic low back pain    Complication of anesthesia    woke up twice during procedures @ [facility] (09/15/2012)   Coronary artery disease    Diabetes mellitus without complication (HCC) 01/09/16   A1c 6.8   Diverticulosis of sigmoid colon    DVT (deep venous thrombosis) (HCC)    Dyslexia    H/O hiatal hernia    Head injury, acute, with loss of consciousness (HCC)    as a child   High cholesterol    Hypertension    Impaired fasting glucose    Last A1c 6.1   Myocardial infarction (HCC)    Neuropathy    PAD (peripheral artery disease)    07/2012:  right ABI 0.64, left ABI 0.57.   Pneumonia    history of    Primary osteoarthritis of left hip    Renal artery stenosis    Bilateral    Severe recurrent major depression without psychotic features (HCC)    Shortness of breath dyspnea    with exertion - due to pain   Stroke [facility]) 2009   after CABG; ~ blind left eye since (09/15/2012)- memory loss    Tobacco abuse    Quit in 2009  Surgical History:  Past Surgical History:  Procedure Laterality Date   2D Echo  02/17/11   EF 50-55%, mild LVH, No WMA, mild MR, mild TR   CARDIAC CATHETERIZATION  ?2009   CARDIOVASCULAR STRESS TEST  08/10/12   Lexiscan: EF 70%, Attenuation artifact in inferior region.  No ischemia or infarct in remaining myocardium. Low risk   COLONOSCOPY W/ POLYPECTOMY     no cancer   CORONARY ARTERY BYPASS GRAFT  2009   2009, CABG X5, LIMA to LAD, SVG to Diagnonal, Sequential SVG to OM 2-3, SVG to PDA; Dr. Lucas   INSERTION OF ILIAC STENT Left 09/15/2012   Procedure: INSERTION OF ILIAC STENT;  Surgeon: Dorn JINNY Lesches, MD;  Location: [facility] CATH LAB;  Service: Cardiovascular;  Laterality: Left;  lt iliac stent   LOWER  EXTREMITY ANGIOGRAM N/A 09/15/2012   Procedure: LOWER EXTREMITY ANGIOGRAM;  Surgeon: Dorn JINNY Lesches, MD;  Location: [facility] CATH LAB;  Service: Cardiovascular;  Laterality: N/A;   PERIPHERAL ARTERIAL STENT GRAFT Left 06/20/2012; ~ 07/2012; 09/15/2012   left; left; left   PV angiogram  09/15/12   restenting of left common iliac for ISRS.  Bilateral occluded SFAs.  Renal artery stenosis: 70% right inferior renal artery, 90% left inferior renal artery stenosis.    TOTAL HIP ARTHROPLASTY Left 05/19/2016   Procedure: LEFT TOTAL HIP ARTHROPLASTY ANTERIOR APPROACH;  Surgeon: Lonni CINDERELLA Poli, MD;  Location: [facility] OR;  Service: Orthopedics;  Laterality: Left;    Medications:  Current Outpatient Medications on File Prior to Visit  Medication Sig   aspirin  EC 81 MG tablet Take 81 mg by mouth daily.   Blood Glucose Monitoring Suppl (ONETOUCH VERIO) w/Device KIT To check blood sugar twice a day and document, bring to provider visits.   glucose blood (ONETOUCH VERIO) test strip To check blood sugar twice a day-before breakfast and 2 hours after a meal- and document, bring to provider visits.   Lancets (ONETOUCH ULTRASOFT) lancets To check blood sugar twice a day and document, bring to provider visits.   No current facility-administered medications on file prior to visit.    Allergies:  Allergies  Allergen Reactions   Duloxetine  Other (See Comments)    Irritability    Social History:  Social History   Socioeconomic History   Marital status: Divorced    Spouse name: Not on file   Number of children: 2   Years of education: Not on file   Highest education level: 11th grade  Occupational History   Occupation: disability  Tobacco Use   Smoking status: Former    Current packs/day: 0.00    Average packs/day: 1.5 packs/day for 30.0 years (45.0 ttl pk-yrs)    Types: Cigarettes    Start date: 07/19/1978    Quit date: 07/19/2008    Years since quitting: 16.3   Smokeless tobacco: Never  Vaping Use    Vaping status: Never Used  Substance and Sexual Activity   Alcohol use: No    Comment: Former Alcoholic, not drinking anymore. 09/15/2012 3-4 beers; shot of crown @ least 2 nights/wk   Drug use: No   Sexual activity: Not Currently  Other Topics Concern   Not on file  Social History Narrative   Not on file   Social Drivers of Health   Financial Resource Strain: Medium Risk (09/14/2024)   Overall Financial Resource Strain (CARDIA)    Difficulty of Paying Living Expenses: Somewhat hard  Food Insecurity: No Food Insecurity (09/14/2024)   Hunger Vital Sign  Worried About Programme Researcher, Broadcasting/film/video in the Last Year: Never true    Ran Out of Food in the Last Year: Never true  Transportation Needs: No Transportation Needs (09/14/2024)   PRAPARE - Administrator, Civil Service (Medical): No    Lack of Transportation (Non-Medical): No  Physical Activity: Inactive (09/14/2024)   Exercise Vital Sign    Days of Exercise per Week: 0 days    Minutes of Exercise per Session: 0 min  Stress: No Stress Concern Present (09/14/2024)   [employer] of Occupational Health - Occupational Stress Questionnaire    Feeling of Stress: Only a little  Social Connections: Socially Isolated (09/14/2024)   Social Connection and Isolation Panel    Frequency of Communication with Friends and Family: Three times a week    Frequency of Social Gatherings with Friends and Family: More than three times a week    Attends Religious Services: Never    Database Administrator or Organizations: No    Attends Banker Meetings: Never    Marital Status: Divorced  Catering Manager Violence: Not At Risk (09/14/2024)   Humiliation, Afraid, Rape, and Kick questionnaire    Fear of Current or Ex-Partner: No    Emotionally Abused: No    Physically Abused: No    Sexually Abused: No   Social History   Tobacco Use  Smoking Status Former   Current packs/day: 0.00   Average packs/day: 1.5 packs/day for 30.0  years (45.0 ttl pk-yrs)   Types: Cigarettes   Start date: 07/19/1978   Quit date: 07/19/2008   Years since quitting: 16.3  Smokeless Tobacco Never   Social History   Substance and Sexual Activity  Alcohol Use No   Comment: Former Alcoholic, not drinking anymore. 09/15/2012 3-4 beers; shot of crown @ least 2 nights/wk    Family History:  Family History  Problem Relation Age of Onset   Diabetes Mother    Heart disease Mother    Hyperlipidemia Mother    Hypertension Mother    Heart attack Mother    Thyroid  disease Mother    Hyperlipidemia Father    Hypertension Father    Heart attack Father    Heart disease Father        before age 14   Alcohol abuse Father    Lung disease Father    Diabetes Sister    Hyperlipidemia Sister    Hypertension Sister    Cancer Sister    Heart disease Sister    Hypertension Sister    Hyperlipidemia Sister    Heart disease Brother        before age 53   Hyperlipidemia Brother    Hypertension Brother    Heart attack Brother    Diabetes Brother    Heart disease Brother    Hyperlipidemia Brother    Hypertension Brother    Stroke Brother    Heart attack Brother     Past medical history, surgical history, medications, allergies, family history and social history reviewed with patient today and changes made to appropriate areas of the chart.   Review of Systems  Constitutional: Negative.   HENT:  Positive for congestion. Negative for ear discharge, ear pain, hearing loss, nosebleeds, sinus pain, sore throat and tinnitus.   Eyes:  Positive for blurred vision. Negative for double vision, photophobia, pain, discharge and redness.  Respiratory:  Positive for cough. Negative for hemoptysis, sputum production, shortness of breath, wheezing and stridor.   Cardiovascular: Negative.  Gastrointestinal:  Positive for constipation and heartburn. Negative for abdominal pain, blood in stool, diarrhea, melena, nausea and vomiting.  Genitourinary:  Negative.   Musculoskeletal:  Positive for back pain, joint pain and myalgias. Negative for falls and neck pain.  Skin: Negative.   Neurological: Negative.   Endo/Heme/Allergies:  Positive for environmental allergies. Negative for polydipsia. Bruises/bleeds easily.  Psychiatric/Behavioral: Negative.     All other ROS negative except what is listed above and in the HPI.      Objective:    BP (!) 156/78 (BP Location: Left Arm, Cuff Size: Normal)   Pulse 76   Temp 98 F (36.7 C) (Oral)   Ht 5' 9.69 (1.77 m)   Wt 178 lb 9.6 oz (81 kg)   SpO2 98%   BMI 25.86 kg/m   Wt Readings from Last 3 Encounters:  11/14/24 178 lb 9.6 oz (81 kg)  09/14/24 176 lb (79.8 kg)  08/04/24 176 lb (79.8 kg)    Physical Exam Vitals and nursing note reviewed.  Constitutional:      General: He is not in acute distress.    Appearance: Normal appearance. He is obese. He is not ill-appearing, toxic-appearing or diaphoretic.  HENT:     Head: Normocephalic and atraumatic.     Right Ear: Tympanic membrane, ear canal and external ear normal. There is no impacted cerumen.     Left Ear: Tympanic membrane, ear canal and external ear normal. There is no impacted cerumen.     Nose: Nose normal. No congestion or rhinorrhea.     Mouth/Throat:     Mouth: Mucous membranes are moist.     Pharynx: Oropharynx is clear. No oropharyngeal exudate or posterior oropharyngeal erythema.  Eyes:     General: No scleral icterus.       Right eye: No discharge.        Left eye: No discharge.     Extraocular Movements: Extraocular movements intact.     Conjunctiva/sclera: Conjunctivae normal.     Pupils: Pupils are equal, round, and reactive to light.  Neck:     Vascular: No carotid bruit.  Cardiovascular:     Rate and Rhythm: Normal rate and regular rhythm.     Pulses: Normal pulses.     Heart sounds: No murmur heard.    No friction rub. No gallop.  Pulmonary:     Effort: Pulmonary effort is normal. No respiratory  distress.     Breath sounds: Normal breath sounds. No stridor. No wheezing, rhonchi or rales.  Chest:     Chest wall: No tenderness.  Abdominal:     General: Abdomen is flat. Bowel sounds are normal. There is no distension.     Palpations: Abdomen is soft. There is no mass.     Tenderness: There is no abdominal tenderness. There is no right CVA tenderness, left CVA tenderness, guarding or rebound.     Hernia: No hernia is present.  Genitourinary:    Comments: Genital exam deferred with shared decision making Musculoskeletal:        General: No swelling, tenderness, deformity or signs of injury.     Cervical back: Normal range of motion and neck supple. No rigidity. No muscular tenderness.     Right lower leg: No edema.     Left lower leg: No edema.  Lymphadenopathy:     Cervical: No cervical adenopathy.  Skin:    General: Skin is warm and dry.     Capillary Refill: Capillary refill takes less  than 2 seconds.     Coloration: Skin is not jaundiced or pale.     Findings: No bruising, erythema, lesion or rash.  Neurological:     General: No focal deficit present.     Mental Status: He is alert and oriented to person, place, and time.     Cranial Nerves: No cranial nerve deficit.     Sensory: No sensory deficit.     Motor: No weakness.     Coordination: Coordination normal.     Gait: Gait normal.     Deep Tendon Reflexes: Reflexes normal.  Psychiatric:        Mood and Affect: Mood normal.        Behavior: Behavior normal.        Thought Content: Thought content normal.        Judgment: Judgment normal.     Results for orders placed or performed in visit on 11/14/24  Microalbumin, Urine Waived   Collection Time: 11/14/24  1:44 PM  Result Value Ref Range   Microalb, Ur Waived 150 (H) 0 - 19 mg/L   Creatinine, Urine Waived 200 10 - 300 mg/dL   Microalb/Creat Ratio 30-300 (H) <30 mg/g  Bayer DCA Hb A1c Waived   Collection Time: 11/14/24  1:44 PM  Result Value Ref Range   HB  A1C (BAYER DCA - WAIVED) 6.0 (H) 4.8 - 5.6 %  Comprehensive metabolic panel with GFR   Collection Time: 11/14/24  1:45 PM  Result Value Ref Range   Glucose 216 (H) 70 - 99 mg/dL   BUN 20 8 - 27 mg/dL   Creatinine, Ser 8.36 (H) 0.76 - 1.27 mg/dL   eGFR 46 (L) >40 fO/fpw/8.26   BUN/Creatinine Ratio 12 10 - 24   Sodium 140 134 - 144 mmol/L   Potassium 4.6 3.5 - 5.2 mmol/L   Chloride 108 (H) 96 - 106 mmol/L   CO2 17 (L) 20 - 29 mmol/L   Calcium  9.0 8.6 - 10.2 mg/dL   Total Protein 6.8 6.0 - 8.5 g/dL   Albumin 4.2 3.9 - 4.9 g/dL   Globulin, Total 2.6 1.5 - 4.5 g/dL   Bilirubin Total 0.3 0.0 - 1.2 mg/dL   Alkaline Phosphatase 110 47 - 123 IU/L   AST 25 0 - 40 IU/L   ALT 34 0 - 44 IU/L  CBC with Differential/Platelet   Collection Time: 11/14/24  1:45 PM  Result Value Ref Range   WBC 6.5 3.4 - 10.8 x10E3/uL   RBC 4.30 4.14 - 5.80 x10E6/uL   Hemoglobin 12.5 (L) 13.0 - 17.7 g/dL   Hematocrit 61.2 62.4 - 51.0 %   MCV 90 79 - 97 fL   MCH 29.1 26.6 - 33.0 pg   MCHC 32.3 31.5 - 35.7 g/dL   RDW 85.4 88.3 - 84.5 %   Platelets 169 150 - 450 x10E3/uL   Neutrophils 63 Not Estab. %   Lymphs 24 Not Estab. %   Monocytes 9 Not Estab. %   Eos 3 Not Estab. %   Basos 1 Not Estab. %   Neutrophils Absolute 4.2 1.4 - 7.0 x10E3/uL   Lymphocytes Absolute 1.5 0.7 - 3.1 x10E3/uL   Monocytes Absolute 0.6 0.1 - 0.9 x10E3/uL   EOS (ABSOLUTE) 0.2 0.0 - 0.4 x10E3/uL   Basophils Absolute 0.1 0.0 - 0.2 x10E3/uL   Immature Granulocytes 0 Not Estab. %   Immature Grans (Abs) 0.0 0.0 - 0.1 x10E3/uL  Lipid Panel w/o Chol/HDL Ratio   Collection  Time: 11/14/24  1:45 PM  Result Value Ref Range   Cholesterol, Total 169 100 - 199 mg/dL   Triglycerides 660 (H) 0 - 149 mg/dL   HDL 36 (L) >60 mg/dL   VLDL Cholesterol Cal 55 (H) 5 - 40 mg/dL   LDL Chol Calc (NIH) 78 0 - 99 mg/dL  PSA   Collection Time: 11/14/24  1:45 PM  Result Value Ref Range   Prostate Specific Ag, Serum 1.4 0.0 - 4.0 ng/mL  TSH   Collection  Time: 11/14/24  1:45 PM  Result Value Ref Range   TSH 0.911 0.450 - 4.500 uIU/mL      Assessment & Plan:   Problem List Items Addressed This Visit       Cardiovascular and Mediastinum   CAD (coronary artery disease)   Will keep BP and cholesterol under good control. Continue to monitor. Call with any concerns.       Relevant Medications   lisinopril  (ZESTRIL ) 40 MG tablet   lovastatin  (MEVACOR ) 40 MG tablet   metoprolol  tartrate (LOPRESSOR ) 50 MG tablet   Other Relevant Orders   Comprehensive metabolic panel with GFR (Completed)   CBC with Differential/Platelet (Completed)   PAD (peripheral artery disease): Left common iliac stent plus additional stent(09/15/12) for ISRS.   Will keep BP and cholesterol under good control. Continue to monitor. Call with any concerns.       Relevant Medications   lisinopril  (ZESTRIL ) 40 MG tablet   lovastatin  (MEVACOR ) 40 MG tablet   metoprolol  tartrate (LOPRESSOR ) 50 MG tablet   Hypertensive heart failure (HCC)   Euvolemic today. Continue current regimen. Continue to monitor. Call with any concerns.       Relevant Medications   lisinopril  (ZESTRIL ) 40 MG tablet   lovastatin  (MEVACOR ) 40 MG tablet   metoprolol  tartrate (LOPRESSOR ) 50 MG tablet   Other Relevant Orders   Comprehensive metabolic panel with GFR (Completed)   CBC with Differential/Platelet (Completed)   Atherosclerotic PVD with intermittent claudication   Will keep BP and cholesterol under good control. Continue to monitor. Call with any concerns.       Relevant Medications   lisinopril  (ZESTRIL ) 40 MG tablet   lovastatin  (MEVACOR ) 40 MG tablet   metoprolol  tartrate (LOPRESSOR ) 50 MG tablet   Other Relevant Orders   Comprehensive metabolic panel with GFR (Completed)   CBC with Differential/Platelet (Completed)   Hypertension associated with diabetes (HCC)   BP running high. Will continue current regimen and monitor closely. Recheck 1-3 months. Call with any concerns.        Relevant Medications   lisinopril  (ZESTRIL ) 40 MG tablet   lovastatin  (MEVACOR ) 40 MG tablet   metoprolol  tartrate (LOPRESSOR ) 50 MG tablet   Semaglutide ,0.25 or 0.5MG /DOS, (OZEMPIC , 0.25 OR 0.5 MG/DOSE,) 2 MG/3ML SOPN   Other Relevant Orders   Comprehensive metabolic panel with GFR (Completed)   CBC with Differential/Platelet (Completed)   TSH (Completed)   Microalbumin, Urine Waived (Completed)     Endocrine   Hyperlipidemia associated with type 2 diabetes mellitus (HCC)   Under good control on current regimen. Continue current regimen. Continue to monitor. Call with any concerns. Refills given. Labs drawn today.        Relevant Medications   lisinopril  (ZESTRIL ) 40 MG tablet   lovastatin  (MEVACOR ) 40 MG tablet   metoprolol  tartrate (LOPRESSOR ) 50 MG tablet   Semaglutide ,0.25 or 0.5MG /DOS, (OZEMPIC , 0.25 OR 0.5 MG/DOSE,) 2 MG/3ML SOPN   Other Relevant Orders   Comprehensive metabolic panel with  GFR (Completed)   CBC with Differential/Platelet (Completed)   Lipid Panel w/o Chol/HDL Ratio (Completed)   Controlled type 2 diabetes mellitus with microalbuminuria (HCC)   A1c doing great at 6.0. Continue current regimen. Continue to monitor. Call with any concerns.       Relevant Medications   lisinopril  (ZESTRIL ) 40 MG tablet   lovastatin  (MEVACOR ) 40 MG tablet   Semaglutide ,0.25 or 0.5MG /DOS, (OZEMPIC , 0.25 OR 0.5 MG/DOSE,) 2 MG/3ML SOPN   Other Relevant Orders   Comprehensive metabolic panel with GFR (Completed)   CBC with Differential/Platelet (Completed)   Microalbumin, Urine Waived (Completed)   Bayer DCA Hb A1c Waived (Completed)     Other   Alcohol abuse, in remission   Encouraged continued abstinence. Call with any concerns.       Relevant Orders   Comprehensive metabolic panel with GFR (Completed)   CBC with Differential/Platelet (Completed)   Severe recurrent major depression (HCC)   Doing well. Refuses PhQ9 and medication. Call with any concerns.        Relevant Orders   Comprehensive metabolic panel with GFR (Completed)   CBC with Differential/Platelet (Completed)   Other Visit Diagnoses       Routine general medical examination at a health care facility    -  Primary   Vaccines up to date. Screening labs checked today. Cologuard ordered. Continue diet and exercise. Call with any concerns.     Hesitancy       Labs drawn today- await results.   Relevant Orders   PSA (Completed)     Needs flu shot       Flu shot given today.   Relevant Orders   Flu vaccine HIGH DOSE PF(Fluzone Trivalent) (Completed)     Screening for colon cancer       Cologuard ordered today.   Relevant Orders   Cologuard        LABORATORY TESTING:  Health maintenance labs ordered today as discussed above.   The natural history of prostate cancer and ongoing controversy regarding screening and potential treatment outcomes of prostate cancer has been discussed with the patient. The meaning of a false positive PSA and a false negative PSA has been discussed. He indicates understanding of the limitations of this screening test and wishes to proceed with screening PSA testing.  IMMUNIZATIONS:   - Tdap: Tetanus vaccination status reviewed: last tetanus booster within 10 years. - Influenza: Administered today - Pneumovax: Up to date - Prevnar: Up to date - COVID: Refused - HPV: Not applicable - Shingrix vaccine: Given elsewhere  SCREENING: - Colonoscopy: Ordered today  Discussed with patient purpose of the colonoscopy is to detect colon cancer at curable precancerous or early stages   PATIENT COUNSELING:    Sexuality: Discussed sexually transmitted diseases, partner selection, use of condoms, avoidance of unintended pregnancy  and contraceptive alternatives.   Advised to avoid cigarette smoking.  I discussed with the patient that most people either abstain from alcohol or drink within safe limits (<=14/week and <=4 drinks/occasion for males, <=7/weeks and  <= 3 drinks/occasion for females) and that the risk for alcohol disorders and other health effects rises proportionally with the number of drinks per week and how often a drinker exceeds daily limits.  Discussed cessation/primary prevention of drug use and availability of treatment for abuse.   Diet: Encouraged to adjust caloric intake to maintain  or achieve ideal body weight, to reduce intake of dietary saturated fat and total fat, to limit sodium intake by avoiding  high sodium foods and not adding table salt, and to maintain adequate dietary potassium and calcium  preferably from fresh fruits, vegetables, and low-fat dairy products.    stressed the importance of regular exercise  Injury prevention: Discussed safety belts, safety helmets, smoke detector, smoking near bedding or upholstery.   Dental health: Discussed importance of regular tooth brushing, flossing, and dental visits.   Follow up plan: NEXT PREVENTATIVE PHYSICAL DUE IN 1 YEAR. No follow-ups on file.

## 2024-11-15 ENCOUNTER — Ambulatory Visit: Payer: Self-pay | Admitting: Family Medicine

## 2024-11-15 LAB — LIPID PANEL W/O CHOL/HDL RATIO
Cholesterol, Total: 169 mg/dL (ref 100–199)
HDL: 36 mg/dL — ABNORMAL LOW (ref >39)
LDL Chol Calc (NIH): 78 mg/dL (ref 0–99)
Triglycerides: 339 mg/dL — ABNORMAL HIGH (ref 0–149)
VLDL Cholesterol Cal: 55 mg/dL — ABNORMAL HIGH (ref 5–40)

## 2024-11-15 LAB — CBC WITH DIFFERENTIAL/PLATELET
Basophils Absolute: 0.1 x10E3/uL (ref 0.0–0.2)
Basos: 1 %
EOS (ABSOLUTE): 0.2 x10E3/uL (ref 0.0–0.4)
Eos: 3 %
Hematocrit: 38.7 % (ref 37.5–51.0)
Hemoglobin: 12.5 g/dL — ABNORMAL LOW (ref 13.0–17.7)
Immature Grans (Abs): 0 x10E3/uL (ref 0.0–0.1)
Immature Granulocytes: 0 %
Lymphocytes Absolute: 1.5 x10E3/uL (ref 0.7–3.1)
Lymphs: 24 %
MCH: 29.1 pg (ref 26.6–33.0)
MCHC: 32.3 g/dL (ref 31.5–35.7)
MCV: 90 fL (ref 79–97)
Monocytes Absolute: 0.6 x10E3/uL (ref 0.1–0.9)
Monocytes: 9 %
Neutrophils Absolute: 4.2 x10E3/uL (ref 1.4–7.0)
Neutrophils: 63 %
Platelets: 169 x10E3/uL (ref 150–450)
RBC: 4.3 x10E6/uL (ref 4.14–5.80)
RDW: 14.5 % (ref 11.6–15.4)
WBC: 6.5 x10E3/uL (ref 3.4–10.8)

## 2024-11-15 LAB — COMPREHENSIVE METABOLIC PANEL WITH GFR
ALT: 34 IU/L (ref 0–44)
AST: 25 IU/L (ref 0–40)
Albumin: 4.2 g/dL (ref 3.9–4.9)
Alkaline Phosphatase: 110 IU/L (ref 47–123)
BUN/Creatinine Ratio: 12 (ref 10–24)
BUN: 20 mg/dL (ref 8–27)
Bilirubin Total: 0.3 mg/dL (ref 0.0–1.2)
CO2: 17 mmol/L — ABNORMAL LOW (ref 20–29)
Calcium: 9 mg/dL (ref 8.6–10.2)
Chloride: 108 mmol/L — ABNORMAL HIGH (ref 96–106)
Creatinine, Ser: 1.63 mg/dL — ABNORMAL HIGH (ref 0.76–1.27)
Globulin, Total: 2.6 g/dL (ref 1.5–4.5)
Glucose: 216 mg/dL — ABNORMAL HIGH (ref 70–99)
Potassium: 4.6 mmol/L (ref 3.5–5.2)
Sodium: 140 mmol/L (ref 134–144)
Total Protein: 6.8 g/dL (ref 6.0–8.5)
eGFR: 46 mL/min/1.73 — ABNORMAL LOW (ref >59)

## 2024-11-15 LAB — PSA: Prostate Specific Ag, Serum: 1.4 ng/mL (ref 0.0–4.0)

## 2024-11-15 LAB — TSH: TSH: 0.911 u[IU]/mL (ref 0.450–4.500)

## 2024-11-19 MED ORDER — OZEMPIC (0.25 OR 0.5 MG/DOSE) 2 MG/3ML ~~LOC~~ SOPN: 0.5000 mg | PEN_INJECTOR | SUBCUTANEOUS | 1 refills | Status: AC

## 2024-11-19 MED ORDER — LOVASTATIN 40 MG PO TABS: 40.0000 mg | ORAL_TABLET | Freq: Every day | ORAL | 1 refills | Status: AC

## 2024-11-19 MED ORDER — LISINOPRIL 40 MG PO TABS: 40.0000 mg | ORAL_TABLET | Freq: Every day | ORAL | 1 refills | Status: AC

## 2024-11-19 MED ORDER — METOPROLOL TARTRATE 50 MG PO TABS: 50.0000 mg | ORAL_TABLET | Freq: Every day | ORAL | 1 refills | Status: AC

## 2024-11-19 NOTE — Assessment & Plan Note (Signed)
 A1c doing great at 6.0. Continue current regimen. Continue to monitor. Call with any concerns.

## 2024-11-19 NOTE — Assessment & Plan Note (Signed)
 Under good control on current regimen. Continue current regimen. Continue to monitor. Call with any concerns. Refills given. Labs drawn today.

## 2024-11-19 NOTE — Assessment & Plan Note (Signed)
 Will keep BP and cholesterol under good control. Continue to monitor. Call with any concerns.

## 2024-11-19 NOTE — Assessment & Plan Note (Signed)
Euvolemic today. Continue current regimen. Continue to monitor. Call with any concerns.  

## 2024-11-19 NOTE — Assessment & Plan Note (Signed)
Encouraged continued abstinence. Call with any concerns.  

## 2024-11-19 NOTE — Assessment & Plan Note (Signed)
 Doing well. Refuses PhQ9 and medication. Call with any concerns.

## 2024-11-19 NOTE — Assessment & Plan Note (Signed)
 BP running high. Will continue current regimen and monitor closely. Recheck 1-3 months. Call with any concerns.

## 2024-11-23 ENCOUNTER — Telehealth: Payer: Self-pay

## 2024-12-07 ENCOUNTER — Other Ambulatory Visit: Payer: Self-pay

## 2024-12-07 DIAGNOSIS — E1159 Type 2 diabetes mellitus with other circulatory complications: Secondary | ICD-10-CM

## 2024-12-07 DIAGNOSIS — E1129 Type 2 diabetes mellitus with other diabetic kidney complication: Secondary | ICD-10-CM

## 2024-12-07 DIAGNOSIS — I152 Hypertension secondary to endocrine disorders: Secondary | ICD-10-CM

## 2024-12-07 DIAGNOSIS — E1169 Type 2 diabetes mellitus with other specified complication: Secondary | ICD-10-CM

## 2024-12-07 NOTE — Progress Notes (Signed)
° °  12/07/24  Patient ID: Lucas Weiss, male   DOB: 1956/11/02, 68 y.o.   MRN: [id number]  Subjective/objective Telephone visit to follow-up on management of diabetes.     Diabetes management plan -Current medications:  Ozempic  0.5mg  weekly! -Patient endorses tolerating Ozempic  0.5mg  well with no adverse side effects noted -Did not tolerate SGLT2 or metformin  therapy in the past -Patient has resumed monitoring home BG and states FBG is averaging 105 -Does not endorse s/sx of hypogycemia -Glipizide  stopped at last PCP visit based on A1c -Patient was receiving Ozempic  through [facility], but we recently applied for [employer] Medicare Extra Help since [facility] will no longer provide Ozempic  at no cost.  Patient was approved for Medicare Extra Help,and he picked up a 3 month supply of Ozempic  on 12/1 for a $12 copay. -ACEi/ARB for cardiorenal protection:  lisinopril  40mg  daily -UACR 30-300 11/14/24 -Statin for ASCVD risk reduction:  lovstatin 40mg  daily  Hypertension -Current medications:  lisinopril  40mg  daily, metoprolol  50mg  daily -Patient does have automated, upper arm home BP monitor and states home BP averages 120/130-60/80 -Last OV BP was elevated at 156/78, but patient does endorse feeling nervous/anxious at provider appointments sometimes  Lab Results  Component Value Date   HGBA1C 6.0 (H) 11/14/2024   HGBA1C 6.8 (H) 08/04/2024   HGBA1C 8.2 (H) 03/21/2024       Component Value Date/Time   NA 140 11/14/2024 1345   NA 140 06/02/2012 0835   K 4.6 11/14/2024 1345   K 4.5 06/02/2012 0835   CL 108 (H) 11/14/2024 1345   CL 109 (H) 06/02/2012 0835   CO2 17 (L) 11/14/2024 1345   CO2 24 06/02/2012 0835   GLUCOSE 216 (H) 11/14/2024 1345   GLUCOSE 143 (H) 05/21/2016 0500   GLUCOSE 122 (H) 06/02/2012 0835   BUN 20 11/14/2024 1345   BUN 14 06/02/2012 0835   CREATININE 1.63 (H) 11/14/2024 1345   CREATININE 1.10 06/02/2012 0835   CALCIUM  9.0 11/14/2024 1345   CALCIUM  9.1 06/02/2012 0835    PROT 6.8 11/14/2024 1345   ALBUMIN 4.2 11/14/2024 1345   AST 25 11/14/2024 1345   ALT 34 11/14/2024 1345   ALKPHOS 110 11/14/2024 1345   BILITOT 0.3 11/14/2024 1345   EGFR 46 (L) 11/14/2024 1345   GFRNONAA 70 10/08/2020 1505   GFRNONAA >60 06/02/2012 0835      Component Value Date/Time   CHOL 169 11/14/2024 1345   TRIG 339 (H) 11/14/2024 1345   HDL 36 (L) 11/14/2024 1345   CHOLHDL 6.3 07/17/2011 0715   VLDL 48 (H) 07/17/2011 0715   LDLCALC 78 11/14/2024 1345   LABVLDL 55 (H) 11/14/2024 1345      Assessment/plan   Diabetes management plan -A1c at goal of <7% -BP is at goal of <130/80 based on home readings -UACR not at goal, but patient is already on ACEi and did not tolerate SGLT2 in the past- continue to monitor -LDL is not at goal of <70- continue to monitor and consider changing to high intensity statin if this does not improve -Continue Ozempic  0.5mg  weekly -Continue to monitor and record FBG daily -Continue regular follow-up with PCP and PharmD  Hypertension -Controlled based on home BP readings -Continue lisinopril  40mg  daily and metoprolol  50mg  daily -If consistently >130/80, consider dosing metoprolol  50mg  BID or changing to XL 50mg  daily    Follow up: 3 months   Channing DELENA Mealing, PharmD, DPLA

## 2025-01-09 NOTE — Progress Notes (Signed)
 Lucas Weiss                                          MRN: [id number]   01/09/2025   The [facility] Quality Team Specialist reviewed this patient medical record for the purposes of chart review for care gap closure. The following were reviewed: chart review for care gap closure-diabetic eye exam.    [facility] Quality Team

## 2025-02-15 ENCOUNTER — Ambulatory Visit: Admitting: Family Medicine

## 2025-03-07 ENCOUNTER — Other Ambulatory Visit

## 2025-09-20 ENCOUNTER — Ambulatory Visit
